# Patient Record
Sex: Female | Born: 1985
Health system: Southern US, Community
[De-identification: ages and names within clinical notes are randomized; demographics above are authoritative.]

## PROBLEM LIST (undated history)

## (undated) DIAGNOSIS — G40219 Localization-related (focal) (partial) symptomatic epilepsy and epileptic syndromes with complex partial seizures, intractable, without status epilepticus: Secondary | ICD-10-CM

## (undated) DIAGNOSIS — T7840XA Allergy, unspecified, initial encounter: Secondary | ICD-10-CM

## (undated) DIAGNOSIS — R29891 Ocular torticollis: Secondary | ICD-10-CM

## (undated) DIAGNOSIS — F419 Anxiety disorder, unspecified: Secondary | ICD-10-CM

## (undated) DIAGNOSIS — D649 Anemia, unspecified: Secondary | ICD-10-CM

## (undated) DIAGNOSIS — H5022 Vertical strabismus, left eye: Secondary | ICD-10-CM

## (undated) DIAGNOSIS — G40909 Epilepsy, unspecified, not intractable, without status epilepticus: Secondary | ICD-10-CM

## (undated) DIAGNOSIS — F79 Unspecified intellectual disabilities: Secondary | ICD-10-CM

## (undated) DIAGNOSIS — R569 Unspecified convulsions: Secondary | ICD-10-CM

## (undated) DIAGNOSIS — G4700 Insomnia, unspecified: Secondary | ICD-10-CM

## (undated) DIAGNOSIS — F070 Personality change due to known physiological condition: Secondary | ICD-10-CM

## (undated) DIAGNOSIS — R451 Restlessness and agitation: Secondary | ICD-10-CM

## (undated) DIAGNOSIS — Z9689 Presence of other specified functional implants: Secondary | ICD-10-CM

## (undated) HISTORY — PX: EYE SURGERY: SHX253

## (undated) HISTORY — DX: Epilepsy, unspecified, not intractable, without status epilepticus: G40.909

## (undated) HISTORY — DX: Personality change due to known physiological condition: F07.0

## (undated) HISTORY — DX: Anxiety disorder, unspecified: F41.9

## (undated) HISTORY — DX: Presence of other specified functional implants: Z96.89

## (undated) HISTORY — DX: Allergy, unspecified, initial encounter: T78.40XA

## (undated) HISTORY — DX: Anemia, unspecified: D64.9

## (undated) HISTORY — PX: BRAIN SURGERY: SHX531

## (undated) HISTORY — DX: Insomnia, unspecified: G47.00

## (undated) HISTORY — PX: TONSILLECTOMY AND ADENOIDECTOMY: SUR1326

## (undated) HISTORY — PX: ABDOMINAL HYSTERECTOMY: SHX81

## (undated) HISTORY — PX: CRANIOTOMY FOR TEMPORAL LOBECTOMY: SUR342

---
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
## 2007-07-26 ENCOUNTER — Emergency Department: Payer: Self-pay | Admitting: Emergency Medicine

## 2008-01-02 ENCOUNTER — Ambulatory Visit: Payer: Self-pay | Admitting: Pediatrics

## 2008-02-03 ENCOUNTER — Emergency Department: Payer: Self-pay | Admitting: Emergency Medicine

## 2008-08-25 ENCOUNTER — Ambulatory Visit: Payer: Self-pay | Admitting: Internal Medicine

## 2009-06-26 ENCOUNTER — Observation Stay: Payer: Self-pay | Admitting: Internal Medicine

## 2009-08-22 ENCOUNTER — Other Ambulatory Visit: Payer: Self-pay | Admitting: Internal Medicine

## 2010-08-14 ENCOUNTER — Other Ambulatory Visit: Payer: Self-pay

## 2010-10-15 ENCOUNTER — Emergency Department: Payer: Self-pay | Admitting: Emergency Medicine

## 2011-01-29 ENCOUNTER — Ambulatory Visit (INDEPENDENT_AMBULATORY_CARE_PROVIDER_SITE_OTHER): Payer: 59 | Admitting: Internal Medicine

## 2011-01-29 ENCOUNTER — Encounter: Payer: Self-pay | Admitting: Internal Medicine

## 2011-01-29 ENCOUNTER — Telehealth: Payer: Self-pay | Admitting: Internal Medicine

## 2011-01-29 VITALS — BP 119/84 | HR 81 | Temp 97.1°F | Resp 16 | Ht 64.0 in | Wt 127.5 lb

## 2011-01-29 DIAGNOSIS — Z23 Encounter for immunization: Secondary | ICD-10-CM

## 2011-01-29 DIAGNOSIS — H1030 Unspecified acute conjunctivitis, unspecified eye: Secondary | ICD-10-CM

## 2011-01-29 MED ORDER — CIPROFLOXACIN HCL 0.3 % OP SOLN: 1.0000 [drp] | OPHTHALMIC | Status: AC

## 2011-01-29 NOTE — Telephone Encounter (Signed)
Patient was seen today by Dr. Darrick Huntsman

## 2011-01-29 NOTE — Progress Notes (Signed)
  Subjective:    Patient ID: Lindsey [name], female    DOB: 05/07/1986, 25 y.o.   MRN: [id number]  HPI  25 yo female with history of right temporal lobe lobectomy secondary to eplepsy since age 89, brought in by mother for sudden onset of conjunctivitis of right lower eyelid which began 2 days ago.     Review of Systems     Objective:   Physical Exam  Eyes:    Lymphadenopathy:       Head (right side): Preauricular and posterior auricular adenopathy present.    She has cervical adenopathy.       Right cervical: Posterior cervical adenopathy present.         with swe Assessment & Plan:  Conjunctivitis: involving her right eye,  With redness and swelling since Saturday night,with clear drainange so far.  No allergic symptoms. Will treat with ciprofloxacin.

## 2011-01-29 NOTE — Telephone Encounter (Signed)
Patient has right eye irrated started Saturday.  Eye is real watering/swollen  Can pt be seen today

## 2011-01-29 NOTE — Patient Instructions (Addendum)
Conjunctivitis Conjunctivitis is commonly called "pink eye." Conjunctivitis can be caused by bacterial or viral infection, allergies, or injuries. There is usually redness of the lining of the eye, itching, discomfort, and sometimes discharge. There may be deposits of matter along the eyelids. A viral infection usually causes a watery discharge, while a bacterial infection causes a yellowish, thick discharge. Pink eye is very contagious and spreads by direct contact. You may be given antibiotic eyedrops as part of your treatment. Before using your eye medicine, remove all drainage from the eye by washing gently with warm water and cotton balls. Continue to use the medication until you have awakened 2 mornings in a row without discharge from the eye. Do not rub your eye. This increases the irritation and helps spread infection. Use separate towels from other household members. Wash your hands with soap and water before and after touching your eyes. Use cold compresses to reduce pain and sunglasses to relieve irritation from light. Do not wear contact lenses or wear eye makeup until the infection is gone. SEEK MEDICAL CARE IF:  Your symptoms are not better after 3 days of treatment.   You have increased pain or trouble seeing.   The outer eyelids become very red or swollen.  Document Released: 05/31/2004 Document Re-Released: 07/18/2009 [facility] Patient Information 2011 [place], [place].   We are prescribing  Ciprofloxacin eye drops to use on Lindsey [name]'s right eye.  If she will not tolerate the drops,  Call us back and we will prescribe oral medications.

## 2011-02-12 ENCOUNTER — Telehealth: Payer: Self-pay | Admitting: Internal Medicine

## 2011-02-12 MED ORDER — DIAZEPAM 5 MG PO TABS: ORAL_TABLET | ORAL | Status: DC

## 2011-02-12 NOTE — Telephone Encounter (Signed)
Patient was instructed she could take a pill and a half but the instructions on her medication is reading only reading to take one pill . She wants the direction changed on her medication . Could you send into the pharmacy new instructions.

## 2011-02-12 NOTE — Telephone Encounter (Signed)
Rx has been called in  

## 2011-03-09 ENCOUNTER — Telehealth: Payer: Self-pay | Admitting: Internal Medicine

## 2011-03-09 NOTE — Telephone Encounter (Signed)
Wherever Dr. Darrick Huntsman has an appt open for a 30 minute visit put her in there.

## 2011-03-09 NOTE — Telephone Encounter (Signed)
Pt mom called she need appointment for Lindsey [name] to get a general cpx to get a form for caps services filled out. She needs this appointment asap  Please let me know when i can get her in  [phone]

## 2011-03-12 NOTE — Telephone Encounter (Signed)
appt 11/14 ok per Morrie Sheldon w

## 2011-03-21 ENCOUNTER — Encounter: Payer: Self-pay | Admitting: Internal Medicine

## 2011-03-21 ENCOUNTER — Ambulatory Visit (INDEPENDENT_AMBULATORY_CARE_PROVIDER_SITE_OTHER): Payer: 59 | Admitting: Internal Medicine

## 2011-03-21 VITALS — BP 122/82 | HR 80 | Temp 98.3°F | Resp 16 | Ht 64.0 in | Wt 133.5 lb

## 2011-03-21 DIAGNOSIS — G40909 Epilepsy, unspecified, not intractable, without status epilepticus: Secondary | ICD-10-CM

## 2011-03-23 DIAGNOSIS — G40909 Epilepsy, unspecified, not intractable, without status epilepticus: Secondary | ICD-10-CM | POA: Insufficient documentation

## 2011-03-23 DIAGNOSIS — G40119 Localization-related (focal) (partial) symptomatic epilepsy and epileptic syndromes with simple partial seizures, intractable, without status epilepticus: Secondary | ICD-10-CM | POA: Insufficient documentation

## 2011-03-24 ENCOUNTER — Encounter: Payer: Self-pay | Admitting: Internal Medicine

## 2011-03-24 NOTE — Progress Notes (Signed)
  Subjective:    Patient ID: Lindsey [name], female    DOB: March 16, 1986, 25 y.o.   MRN: [id number]  HPI  Lindsey [name] is a 25 yo white female with a history of seizure disorder, s/p temporal lobectomy and vagal nerve stimulator , followed by Dr. Quintin Alto at [facility], who is brought in by her mother Lindsey [name] for her annual exam.  Due to behavioral issues we have agreed that pelvic exams are not necessary as she is s/p hysterectomy, lives with her mother and is not sexually active.  She continues to have episodes of constipation managed with daily fiber laxatives.  Has not had a seizure in over a month, since her medications were adjusted.  No new issues.  Attends daycare  With other adult patients with brain syndromes.    Past Medical History  Diagnosis Date  . Temporal lobectomy behavior syndrome 1994    Right  . Status post VNS (vagus nerve stimulator) placement 2000  . Epilepsy age 25    negative metabolic workup has brain abnormalities by MRI (Dr. Quintin Alto, [facility])  . Arthritis   . Insomnia    Current Outpatient Prescriptions on File Prior to Visit  Medication Sig Dispense Refill  . Clindamycin-Benzoyl Per, Refr, gel Apply topically daily as needed.        . diazepam (VALIUM) 5 MG tablet Take one and a half tablet at bedtime as needed.  45 tablet  3  . lamoTRIgine (LAMICTAL) 100 MG tablet Take 3 tablets by mouth two times a day.       . promethazine (PHENERGAN) 25 MG suppository Place 25 mg rectally every 6 (six) hours as needed.        Marland Kitchen QUEtiapine (SEROQUEL XR) 50 MG TB24 Take 50 mg by mouth daily.        Marland Kitchen tretinoin microspheres (RETIN-A MICRO) 0.04 % gel Apply topically as needed.          Review of Systems  Unable to perform ROS      Objective:   Physical Exam  Constitutional: She is oriented to person, place, and time. She appears well-developed and well-nourished.  HENT:  Mouth/Throat: Oropharynx is clear and moist.  Eyes: EOM are normal. Pupils are equal, round, and reactive to light.  No scleral icterus.  Neck: Normal range of motion. Neck supple. No JVD present. No thyromegaly present.  Cardiovascular: Normal rate, regular rhythm, normal heart sounds and intact distal pulses.   Pulmonary/Chest: Effort normal and breath sounds normal.  Abdominal: Soft. Bowel sounds are normal. She exhibits no mass. There is no tenderness.  Musculoskeletal: Normal range of motion. She exhibits no edema.  Lymphadenopathy:    She has no cervical adenopathy.  Neurological: She is alert and oriented to person, place, and time.  Skin: Skin is warm and dry.  Psychiatric: Her mood appears anxious. Her speech is delayed. She is is hyperactive. Cognition and memory are impaired. She expresses impulsivity.          Assessment & Plan:  Annual exam:  Given her disability, she is not a candidate for mammograms due to her behavioral problems,  and has undergone hysterectomy.   Screening for metabolic disorders continues on an annual basis for hypothyroidism and diabetes.

## 2011-03-24 NOTE — Assessment & Plan Note (Addendum)
Currently controlled, with prior hospitalzations for grand mal seziure but none in the past 6 to 8 months  Continue current medications and followup with Dr. Quintin Alto as planned

## 2011-05-02 ENCOUNTER — Encounter: Payer: Self-pay | Admitting: Internal Medicine

## 2011-05-02 ENCOUNTER — Ambulatory Visit (INDEPENDENT_AMBULATORY_CARE_PROVIDER_SITE_OTHER): Payer: 59 | Admitting: Internal Medicine

## 2011-05-02 VITALS — BP 110/62 | HR 90 | Temp 99.0°F | Wt 137.0 lb

## 2011-05-02 DIAGNOSIS — H00029 Hordeolum internum unspecified eye, unspecified eyelid: Secondary | ICD-10-CM

## 2011-05-02 MED ORDER — POLYMYXIN B-TRIMETHOPRIM 10000-0.1 UNIT/ML-% OP SOLN
1.0000 [drp] | OPHTHALMIC | Status: DC
Start: 2011-05-02 — End: 2014-03-25

## 2011-05-02 NOTE — Patient Instructions (Signed)
Sty A sty (hordeolum) is an infection of a gland in the eyelid located at the base of the eyelash. A sty may develop a white or yellow head of pus. It can be puffy (swollen). Usually, the sty will burst and pus will come out on its own. They do not leave lumps in the eyelid once they drain. A sty is often confused with another form of cyst of the eyelid called a chalazion. Chalazions occur within the eyelid and not on the edge where the bases of the eyelashes are. They often are red, sore and then form firm lumps in the eyelid. CAUSES   Germs (bacteria).   Lasting (chronic) eyelid inflammation.  SYMPTOMS   Tenderness, redness and swelling along the edge of the eyelid at the base of the eyelashes.   Sometimes, there is a white or yellow head of pus. It may or may not drain.  DIAGNOSIS  An ophthalmologist will be able to distinguish between a sty and a chalazion and treat the condition appropriately.  TREATMENT   Styes are typically treated with warm packs (compresses) until drainage occurs.   In rare cases, medicines that kill germs (antibiotics) may be prescribed. These antibiotics may be in the form of drops, cream or pills.   If a hard lump has formed, it is generally necessary to do a small incision and remove the hardened contents of the cyst in a minor surgical procedure done in the office.   In suspicious cases, your caregiver may send the contents of the cyst to the lab to be certain that it is not a rare, but dangerous form of cancer of the glands of the eyelid.  HOME CARE INSTRUCTIONS   Wash your hands often and dry them with a clean towel. Avoid touching your eyelid. This may spread the infection to other parts of the eye.   Apply heat to your eyelid for 10 to 20 minutes, several times a day, to ease pain and help to heal it faster.   Do not squeeze the sty. Allow it to drain on its own. Wash your eyelid carefully 3 to 4 times per day to remove any pus.  SEEK IMMEDIATE  MEDICAL CARE IF:   Your eye becomes painful or puffy (swollen).   Your vision changes.   Your sty does not drain by itself within 3 days.   Your sty comes back within a short period of time, even with treatment.   You have redness (inflammation) around the eye.   You have a fever.  Document Released: 01/31/2005 Document Revised: 01/03/2011 Document Reviewed: 10/05/2008 [employer] Patient Information 2012 [employer]. 

## 2011-05-02 NOTE — Progress Notes (Signed)
  Subjective:    Patient ID: Lindsey [name], female    DOB: 1986-04-13, 25 y.o.   MRN: [id number]  HPI 25YO female with seizure disorder and developmental delay presents for an acute visit c/o stye right upper eyelid. Pt mother first noticed this lesion 04/30/2011. Tried to apply warm compresses, but daughter will not allow her to.  Minimal drainage from right eye noted. No redness over eye itself. No fever or chills. Pt mother also noted some hoarseness over last few days. No nasal congestion or cough.   Outpatient Encounter Prescriptions as of 05/02/2011  Medication Sig Dispense Refill  . Clindamycin-Benzoyl Per, Refr, gel Apply topically daily as needed.        . diazepam (VALIUM) 5 MG tablet Take one and a half tablet at bedtime as needed.  45 tablet  3  . lamoTRIgine (LAMICTAL) 100 MG tablet Take 3 tablets by mouth two times a day.       . promethazine (PHENERGAN) 25 MG suppository Place 25 mg rectally every 6 (six) hours as needed.        Marland Kitchen QUEtiapine (SEROQUEL XR) 50 MG TB24 Take 50 mg by mouth daily.        Marland Kitchen tretinoin microspheres (RETIN-A MICRO) 0.04 % gel Apply topically as needed.        . trimethoprim-polymyxin b (POLYTRIM) ophthalmic solution Place 1 drop into the right eye every 4 (four) hours.  10 mL  0     Review of Systems  Constitutional: Negative for fever and chills.  Eyes: Positive for discharge and redness (right upper eyelid).   BP 110/62  Pulse 90  Temp(Src) 99 F (37.2 C) (Axillary)  Wt 137 lb (62.143 kg)  SpO2 96%  LMP 01/09/2011     Objective:   Physical Exam  Constitutional: She appears well-developed and well-nourished. No distress.  HENT:  Head: Normocephalic and atraumatic.  Eyes: Conjunctivae and EOM are normal. Pupils are equal, round, and reactive to light. Right eye exhibits no discharge. Left eye exhibits no discharge.    Cardiovascular: Normal rate and regular rhythm.   Pulmonary/Chest: No accessory muscle usage. Not tachypneic. She has  no decreased breath sounds. She has no wheezes. She has no rhonchi. She has no rales.  Skin: She is not diaphoretic.  Psychiatric: Her speech is normal and behavior is normal. Her mood appears anxious. Cognition and memory are impaired.          Assessment & Plan:  1. Stye - Recommended using warm compresses. Gave handout on stye's.  Will also have pt mother apply topical polytrim.  If no improvement or if erythema worsens over next 24hr, they will call.

## 2011-05-29 ENCOUNTER — Other Ambulatory Visit: Payer: Self-pay | Admitting: *Deleted

## 2011-05-29 MED ORDER — DIAZEPAM 5 MG PO TABS: ORAL_TABLET | ORAL | Status: DC

## 2011-05-29 NOTE — Telephone Encounter (Signed)
Faxed request from [facility] [address], request is for a new script.

## 2011-05-31 NOTE — Telephone Encounter (Signed)
Medication has been called to pharmacy.  

## 2011-06-27 ENCOUNTER — Ambulatory Visit (INDEPENDENT_AMBULATORY_CARE_PROVIDER_SITE_OTHER): Payer: 59 | Admitting: Internal Medicine

## 2011-06-27 ENCOUNTER — Encounter: Payer: Self-pay | Admitting: Internal Medicine

## 2011-06-27 DIAGNOSIS — B9789 Other viral agents as the cause of diseases classified elsewhere: Secondary | ICD-10-CM

## 2011-06-27 DIAGNOSIS — B349 Viral infection, unspecified: Secondary | ICD-10-CM

## 2011-06-27 DIAGNOSIS — L259 Unspecified contact dermatitis, unspecified cause: Secondary | ICD-10-CM

## 2011-06-27 DIAGNOSIS — L708 Other acne: Secondary | ICD-10-CM

## 2011-06-27 DIAGNOSIS — J029 Acute pharyngitis, unspecified: Secondary | ICD-10-CM

## 2011-06-27 MED ORDER — AMOXICILLIN-POT CLAVULANATE 875-125 MG PO TABS: 1.0000 | ORAL_TABLET | Freq: Two times a day (BID) | ORAL | Status: AC

## 2011-06-27 NOTE — Patient Instructions (Signed)
Lindsey [name] has a viral infection.  If you can get her to use simply saline nasal spray twice daily to flush sinuses,  It will help prevent a bacterial sinus infection .     Topical acne preparations with benzoyl peroxide or salicylic acid,,  Also try salt paste applied for 15 minutes to back,  Then rinse off

## 2011-06-30 DIAGNOSIS — J029 Acute pharyngitis, unspecified: Secondary | ICD-10-CM | POA: Insufficient documentation

## 2011-06-30 DIAGNOSIS — B349 Viral infection, unspecified: Secondary | ICD-10-CM | POA: Insufficient documentation

## 2011-06-30 DIAGNOSIS — L708 Other acne: Secondary | ICD-10-CM | POA: Insufficient documentation

## 2011-06-30 NOTE — Assessment & Plan Note (Signed)
Affecting skin on her back.  Recommended topical treatments containing salicylic acid or benzoyl peroxide.

## 2011-06-30 NOTE — Assessment & Plan Note (Signed)
She has  No signs of symptoms of bacterila pharyngitis.  Recommend use of salt water gargles,  Saline sinus rinse, avoid decongestants given history of seizures.  Stann Mainland start empiric antibiotics if she spikes a fever or decompensates.

## 2011-06-30 NOTE — Progress Notes (Signed)
Subjective:    Patient ID: Lindsey [name], female    DOB: Dec 05, 1985, 26 y.o.   MRN: [id number]  HPI  25 yr white female with cerebral palsy,  Seizure disorder, brought in by mother with cc of sore throat for the last 24 hours.  No fevers, cough, or reports of ear or sinus pain .   No recent sick contacts.  Past Medical History  Diagnosis Date  . Temporal lobectomy behavior syndrome 1994    Right  . Status post VNS (vagus nerve stimulator) placement 2000  . Epilepsy age 53    negative metabolic workup has brain abnormalities by MRI (Dr. Quintin Alto, [facility])  . Arthritis   . Insomnia    Current Outpatient Prescriptions on File Prior to Visit  Medication Sig Dispense Refill  . Clindamycin-Benzoyl Per, Refr, gel Apply topically daily as needed.        . diazepam (VALIUM) 5 MG tablet Take one and a half tablet at bedtime as needed.  45 tablet  5  . lamoTRIgine (LAMICTAL) 100 MG tablet Take 3 tablets by mouth two times a day.       . promethazine (PHENERGAN) 25 MG suppository Place 25 mg rectally every 6 (six) hours as needed.        Marland Kitchen QUEtiapine (SEROQUEL XR) 50 MG TB24 Take 50 mg by mouth daily.        Marland Kitchen tretinoin microspheres (RETIN-A MICRO) 0.04 % gel Apply topically as needed.           Review of Systems  Constitutional: Negative for fever, chills and unexpected weight change.  HENT: Positive for sore throat. Negative for hearing loss, ear pain, nosebleeds, congestion, facial swelling, rhinorrhea, sneezing, mouth sores, trouble swallowing, neck pain, neck stiffness, voice change, postnasal drip, sinus pressure, tinnitus and ear discharge.   Eyes: Negative for pain, discharge, redness and visual disturbance.  Respiratory: Negative for cough, chest tightness, shortness of breath, wheezing and stridor.   Cardiovascular: Negative for chest pain, palpitations and leg swelling.  Musculoskeletal: Negative for myalgias and arthralgias.  Skin: Negative for color change and rash.  Neurological:  Negative for dizziness, weakness, light-headedness and headaches.  Hematological: Negative for adenopathy.       Objective:   Physical Exam  Constitutional: She is oriented to person, place, and time. She appears well-developed and well-nourished. No distress.  HENT:  Head: Normocephalic and atraumatic.  Mouth/Throat: Oropharynx is clear and moist.  Eyes: Conjunctivae, EOM and lids are normal. Pupils are equal, round, and reactive to light. Right eye exhibits no discharge. Left eye exhibits no discharge. No scleral icterus.  Neck: Normal range of motion. Neck supple. No JVD present. No thyromegaly present.  Cardiovascular: Normal rate, regular rhythm, normal heart sounds and intact distal pulses.   Pulmonary/Chest: Effort normal and breath sounds normal. No accessory muscle usage. Not tachypneic. She has no decreased breath sounds. She has no wheezes. She has no rhonchi. She has no rales.  Abdominal: Soft. Bowel sounds are normal. She exhibits no mass. There is no tenderness.  Musculoskeletal: Normal range of motion. She exhibits no edema.  Lymphadenopathy:    She has no cervical adenopathy.  Neurological: She is alert and oriented to person, place, and time.  Skin: Skin is warm and dry. She is not diaphoretic.  Psychiatric: Her speech is normal and behavior is normal. Her mood appears anxious. Cognition and memory are impaired. She expresses impulsivity.       Assessment & Plan:   Pharyngitis with  viral syndrome She has  No signs of symptoms of bacterila pharyngitis.  Recommend use of salt water gargles,  Saline sinus rinse, avoid decongestants given history of seizures.  Stann Mainland start empiric antibiotics if she spikes a fever or decompensates.   Acneiform dermatitis Affecting skin on her back.  Recommended topical treatments containing salicylic acid or benzoyl peroxide.     Updated Medication List Outpatient Encounter Prescriptions as of 06/27/2011  Medication Sig Dispense Refill   . Clindamycin-Benzoyl Per, Refr, gel Apply topically daily as needed.        . diazepam (VALIUM) 5 MG tablet Take one and a half tablet at bedtime as needed.  45 tablet  5  . lamoTRIgine (LAMICTAL) 100 MG tablet Take 3 tablets by mouth two times a day.       . promethazine (PHENERGAN) 25 MG suppository Place 25 mg rectally every 6 (six) hours as needed.        Marland Kitchen QUEtiapine (SEROQUEL XR) 50 MG TB24 Take 50 mg by mouth daily.        Marland Kitchen tretinoin microspheres (RETIN-A MICRO) 0.04 % gel Apply topically as needed.        Marland Kitchen amoxicillin-clavulanate (AUGMENTIN) 875-125 MG per tablet Take 1 tablet by mouth 2 (two) times daily.  14 tablet  0

## 2011-07-03 ENCOUNTER — Other Ambulatory Visit: Payer: Self-pay | Admitting: Internal Medicine

## 2011-07-03 MED ORDER — QUETIAPINE FUMARATE ER 50 MG PO TB24: 50.0000 mg | ORAL_TABLET | Freq: Every day | ORAL | Status: DC

## 2011-10-15 ENCOUNTER — Encounter: Payer: Self-pay | Admitting: Internal Medicine

## 2011-10-15 ENCOUNTER — Ambulatory Visit (INDEPENDENT_AMBULATORY_CARE_PROVIDER_SITE_OTHER): Payer: 59 | Admitting: Internal Medicine

## 2011-10-15 VITALS — BP 108/66 | HR 100 | Temp 98.8°F | Resp 16 | Wt 139.5 lb

## 2011-10-15 DIAGNOSIS — B349 Viral infection, unspecified: Secondary | ICD-10-CM

## 2011-10-15 DIAGNOSIS — J029 Acute pharyngitis, unspecified: Secondary | ICD-10-CM

## 2011-10-15 DIAGNOSIS — B9789 Other viral agents as the cause of diseases classified elsewhere: Secondary | ICD-10-CM

## 2011-10-15 MED ORDER — AZITHROMYCIN 500 MG PO TABS: 500.0000 mg | ORAL_TABLET | Freq: Every day | ORAL | Status: AC

## 2011-10-15 NOTE — Progress Notes (Signed)
Patient ID: Lindsey [name], female   DOB: 17-Nov-1985, 26 y.o.   MRN: [id number]  Patient Active Problem List  Diagnoses  . Seizure disorder, primary  . Pharyngitis with viral syndrome  . Acneiform dermatitis    Subjective:  CC:   Chief Complaint  Patient presents with  . Sore Throat    HPI:   Lindsey [name] a 26 y.o. female who presents with a sore throat.  Per mother, patient has been complaining of sore throat for 2 days without fever.  Her sinuses have been congested and mother has had to manually decompress them with suction as patient does not like to blow her nose.  They are going to the beach this weekend. She cannot take antihistamines or decongestants due to her seizure disorder.    Past Medical History  Diagnosis Date  . Temporal lobectomy behavior syndrome 1994    Right  . Status post VNS (vagus nerve stimulator) placement 2000  . Epilepsy age 39    negative metabolic workup has brain abnormalities by MRI (Dr. Quintin Alto, [facility])  . Arthritis   . Insomnia     Past Surgical History  Procedure Date  . Tonsillectomy and adenoidectomy age 57  . Abdominal hysterectomy 2005  . Brain surgery     Temporal Lobe resection  . Craniotomy for temporal lobectomy          The following portions of the patient's history were reviewed and updated as appropriate: Allergies, current medications, and problem list.    Review of Systems:   12 Pt  review of systems was negative except those addressed in the HPI,     History   Social History  . Marital Status: Single    Spouse Name: N/A    Number of Children: N/A  . Years of Education: N/A   Occupational History  . Not on file.   Social History Main Topics  . Smoking status: Never Smoker   . Smokeless tobacco: Never Used  . Alcohol Use: No  . Drug Use: No  . Sexually Active: Not on file   Other Topics Concern  . Not on file   Social History Narrative  . No narrative on file    Objective:  BP  108/66  Pulse 100  Temp(Src) 98.8 F (37.1 C) (Axillary)  Resp 16  Wt 139 lb 8 oz (63.277 kg)  SpO2 97%  LMP 01/09/2011  General appearance: alert, cooperative and appears stated age Ears: l TM's not visible due to cerumen,  and external ear canals both ears Throat: lips, mucosa, and tongue normal; teeth and gums normal. oropharnyx mildly  Erythematous, no ulcers.  Neck: no adenopathy, no carotid bruit, supple, symmetrical, trachea midline and thyroid not enlarged, symmetric, no tenderness/mass/nodules Back: symmetric, no curvature. ROM normal. No CVA tenderness. Lungs: clear to auscultation bilaterally Heart: regular rate and rhythm, S1, S2 normal, no murmur, click, rub or gallop Abdomen: soft, non-tender; bowel sounds normal; no masses,  no organomegaly Pulses: 2+ and symmetric Skin: Skin color, texture, turgor normal. No rashes or lesions Lymph nodes: Cervical, supraclavicular, and axillary nodes normal.  Assessment and Plan:  Pharyngitis with viral syndrome No signs on exam of bacterial infection, but her TMs are occluded by cerumen impaction and she is at increased risk for bacterial superinfection from viral syndromes because of her inability to blow nose, and manage symptoms with decongestants, sinus lavage, etc. rx for antibiotic given to [name], her mother, for use if symptoms progress  Updated Medication List Outpatient Encounter Prescriptions as of 10/15/2011  Medication Sig Dispense Refill  . Clindamycin-Benzoyl Per, Refr, gel Apply topically daily as needed.        . diazepam (VALIUM) 5 MG tablet Take one and a half tablet at bedtime as needed.  45 tablet  5  . lamoTRIgine (LAMICTAL) 100 MG tablet Take 3 tablets by mouth two times a day.       . promethazine (PHENERGAN) 25 MG suppository Place 25 mg rectally every 6 (six) hours as needed.        Marland Kitchen QUEtiapine (SEROQUEL XR) 50 MG TB24 Take 1 tablet (50 mg total) by mouth daily.  90 each  3  . tretinoin microspheres  (RETIN-A MICRO) 0.04 % gel Apply topically as needed.        Marland Kitchen azithromycin (ZITHROMAX) 500 MG tablet Take 1 tablet (500 mg total) by mouth daily.  7 tablet  0     No orders of the defined types were placed in this encounter.    No Follow-up on file.

## 2011-10-15 NOTE — Assessment & Plan Note (Signed)
No signs on exam of bacterial infection, but her TMs are occluded by cerumen impaction and she is at increased risk for bacterial superinfection from viral syndromes because of her inability to blow nose, and manage symptoms with decongestants, sinus lavage, etc. rx for antibiotic given to [name], her mother, for use if symptoms progress

## 2011-11-27 ENCOUNTER — Other Ambulatory Visit: Payer: Self-pay | Admitting: Internal Medicine

## 2011-11-27 MED ORDER — DIAZEPAM 5 MG PO TABS: ORAL_TABLET | ORAL | Status: DC

## 2011-11-28 ENCOUNTER — Telehealth: Payer: Self-pay | Admitting: Internal Medicine

## 2011-11-28 NOTE — Telephone Encounter (Signed)
Form will be mailed when completed.

## 2011-11-28 NOTE — Telephone Encounter (Signed)
Form in box for community support services   Pt mom would like this mailed to her.  Attached self address stamped envelope

## 2011-12-17 ENCOUNTER — Telehealth: Payer: Self-pay | Admitting: Internal Medicine

## 2011-12-17 NOTE — Telephone Encounter (Signed)
Community support service form in box Please mail to pt when ready

## 2011-12-17 NOTE — Telephone Encounter (Signed)
Forms in red folder to be signed.

## 2012-01-29 ENCOUNTER — Encounter: Payer: Self-pay | Admitting: Internal Medicine

## 2012-01-29 ENCOUNTER — Ambulatory Visit (INDEPENDENT_AMBULATORY_CARE_PROVIDER_SITE_OTHER): Payer: 59 | Admitting: Internal Medicine

## 2012-01-29 VITALS — BP 120/68 | HR 120 | Temp 99.0°F | Resp 18 | Wt 150.0 lb

## 2012-01-29 DIAGNOSIS — B349 Viral infection, unspecified: Secondary | ICD-10-CM

## 2012-01-29 DIAGNOSIS — B9789 Other viral agents as the cause of diseases classified elsewhere: Secondary | ICD-10-CM

## 2012-01-29 DIAGNOSIS — J029 Acute pharyngitis, unspecified: Secondary | ICD-10-CM

## 2012-01-29 DIAGNOSIS — Z23 Encounter for immunization: Secondary | ICD-10-CM

## 2012-01-29 NOTE — Addendum Note (Signed)
Addended by: Darletta Moll A on: 01/29/2012 10:04 AM   Modules accepted: Orders

## 2012-01-29 NOTE — Progress Notes (Signed)
Patient ID: Lindsey [name], female   DOB: December 19, 1985, 26 y.o.   MRN: [id number]   Patient Active Problem List  Diagnosis  . Seizure disorder, primary  . Pharyngitis with viral syndrome  . Acneiform dermatitis    Subjective:  CC:   Chief Complaint  Patient presents with  . Cough  . Sinusitis    HPI:   Lindsey [name] a 26 y.o. female who presents2 day history of runny nose,  Sore throat and cough.  Mother reports no fevers, diarrhea or wheezing.   No sick contacts.      Past Medical History  Diagnosis Date  . Temporal lobectomy behavior syndrome 1994    Right  . Status post VNS (vagus nerve stimulator) placement 2000  . Epilepsy age 35    negative metabolic workup has brain abnormalities by MRI (Dr. Quintin Alto, [facility])  . Arthritis   . Insomnia     Past Surgical History  Procedure Date  . Tonsillectomy and adenoidectomy age 87  . Abdominal hysterectomy 2005  . Brain surgery     Temporal Lobe resection  . Craniotomy for temporal lobectomy          The following portions of the patient's history were reviewed and updated as appropriate: Allergies, current medications, and problem list.    Review of Systems:   12 Pt  review of systems was negative except those addressed in the HPI,     History   Social History  . Marital Status: Single    Spouse Name: N/A    Number of Children: N/A  . Years of Education: N/A   Occupational History  . Not on file.   Social History Main Topics  . Smoking status: Never Smoker   . Smokeless tobacco: Never Used  . Alcohol Use: No  . Drug Use: No  . Sexually Active: Not on file   Other Topics Concern  . Not on file   Social History Narrative  . No narrative on file    Objective:  BP 120/68  Pulse 120  Temp 99 F (37.2 C) (Axillary)  Resp 18  Wt 150 lb (68.04 kg)  SpO2 98%  LMP 01/09/2011  General appearance: alert, cooperative and appears stated age Ears: normal TM's and external ear canals both  ears Throat: lips, mucosa, and tongue normal; teeth and gums normal Neck: no adenopathy, no carotid bruit, supple, symmetrical, trachea midline and thyroid not enlarged, symmetric, no tenderness/mass/nodules Back: symmetric, no curvature. ROM normal. No CVA tenderness. Lungs: clear to auscultation bilaterally Heart: regular rate and rhythm, S1, S2 normal, no murmur, click, rub or gallop Abdomen: soft, non-tender; bowel sounds normal; no masses,  no organomegaly Pulses: 2+ and symmetric Skin: Skin color, texture, turgor normal. No rashes or lesions Lymph nodes: Cervical, supraclavicular, and axillary nodes normal.  Assessment and Plan:  Pharyngitis with viral syndrome HEENT exam is completely normal. Suggest OTC antihistamine if tolerated .  OK to give flu shot today   Updated Medication List Outpatient Encounter Prescriptions as of 01/29/2012  Medication Sig Dispense Refill  . diazepam (VALIUM) 5 MG tablet Take one and a half tablet at bedtime as needed.  45 tablet  5  . lamoTRIgine (LAMICTAL) 100 MG tablet Take 3 tablets by mouth two times a day.       . promethazine (PHENERGAN) 25 MG suppository Place 25 mg rectally every 6 (six) hours as needed.        Marland Kitchen QUEtiapine (SEROQUEL XR) 50 MG TB24 Take  1 tablet (50 mg total) by mouth daily.  90 each  3  . DISCONTD: Clindamycin-Benzoyl Per, Refr, gel Apply topically daily as needed.        Marland Kitchen DISCONTD: tretinoin microspheres (RETIN-A MICRO) 0.04 % gel Apply topically as needed.           No orders of the defined types were placed in this encounter.    No Follow-up on file.

## 2012-01-29 NOTE — Assessment & Plan Note (Signed)
HEENT exam is completely normal. Suggest OTC antihistamine if tolerated .  OK to give flu shot today

## 2012-01-31 ENCOUNTER — Ambulatory Visit: Payer: 59

## 2012-02-04 ENCOUNTER — Ambulatory Visit (INDEPENDENT_AMBULATORY_CARE_PROVIDER_SITE_OTHER): Payer: 59 | Admitting: Internal Medicine

## 2012-02-04 ENCOUNTER — Encounter: Payer: Self-pay | Admitting: Internal Medicine

## 2012-02-04 VITALS — BP 118/66 | HR 82 | Temp 98.2°F | Ht 64.5 in | Wt 150.5 lb

## 2012-02-04 DIAGNOSIS — H00019 Hordeolum externum unspecified eye, unspecified eyelid: Secondary | ICD-10-CM

## 2012-02-04 NOTE — Patient Instructions (Addendum)
It was nice meeting both of you today.  I have prescribed polymyxin eye drops - to use as directed.  Let us know if symptoms do not completely resolve.

## 2012-02-04 NOTE — Progress Notes (Signed)
  Subjective:    Patient ID: Lindsey [name], female    DOB: Dec 31, 1985, 26 y.o.   MRN: [id number]  HPI  26 year old female with past history of seizure disorder who comes in today for an acute visit with concerns regarding eye irritation.  She is accompanied by her mother.  History obtained from her mother.   Past Medical History  Diagnosis Date  . Temporal lobectomy behavior syndrome 1994    Right  . Status post VNS (vagus nerve stimulator) placement 2000  . Epilepsy age 89    negative metabolic workup has brain abnormalities by MRI (Dr. Quintin Alto, [facility])  . Arthritis   . Insomnia    Outpatient Prescriptions Prior to Visit  Medication Sig Dispense Refill  . diazepam (VALIUM) 5 MG tablet Take one and a half tablet at bedtime as needed.  45 tablet  5  . lamoTRIgine (LAMICTAL) 100 MG tablet Take 3 tablets by mouth two times a day.       . promethazine (PHENERGAN) 25 MG suppository Place 25 mg rectally every 6 (six) hours as needed.        Marland Kitchen QUEtiapine (SEROQUEL XR) 50 MG TB24 Take 1 tablet (50 mg total) by mouth daily.  90 each  3    Review of Systems  Was seen last week.  Had some congestion.  This has resolved.  Had the flu shot.  Experienced arm soreness after the injection.  This has resolved.  Mother reports that starting two days ago, she noticed some eye redness (corner of her right eye).  No pain.  No discharge.  Has had problems with this previously - with last episode approximately six months ago.  No sore throat or nasal congestion.   No cough.         Objective:   Physical Exam 26 year old female in no acute distress.   HEENT:  Eyes - minimal erythema lower medial corner left eye.  No discharge.  Possible small sty.  Nares - clear.  OP- without lesions or erythema.  NECK:  Supple, nontender.    HEART:  Appears to be regular. LUNGS:  Without crackles or wheezing audible.  Respirations even and unlabored.       Assessment & Plan:  1.  STY.  Will treat with Polytrim (opth)  drops q 4-6 hours as directed.  I refilled her last rx.  Mother states she responded well to the Polytrim and tolerated well.  Instructed to call or return if symptoms did not improve.

## 2012-05-08 ENCOUNTER — Encounter: Payer: Self-pay | Admitting: Internal Medicine

## 2012-05-08 ENCOUNTER — Ambulatory Visit (INDEPENDENT_AMBULATORY_CARE_PROVIDER_SITE_OTHER): Payer: 59 | Admitting: Internal Medicine

## 2012-05-08 VITALS — BP 104/60 | HR 113 | Temp 97.2°F | Resp 16 | Wt 156.0 lb

## 2012-05-08 DIAGNOSIS — J069 Acute upper respiratory infection, unspecified: Secondary | ICD-10-CM | POA: Insufficient documentation

## 2012-05-08 DIAGNOSIS — G40909 Epilepsy, unspecified, not intractable, without status epilepticus: Secondary | ICD-10-CM

## 2012-05-08 DIAGNOSIS — G809 Cerebral palsy, unspecified: Secondary | ICD-10-CM

## 2012-05-08 MED ORDER — LEVOFLOXACIN 500 MG PO TABS: 500.0000 mg | ORAL_TABLET | Freq: Every day | ORAL | Status: DC

## 2012-05-08 NOTE — Assessment & Plan Note (Signed)
She was seizure-free in November  and had only one seizure in December. This represents an improvement.nHer disorder is managed with a vagus nerve stimulator and in medications .   follows up with Dr. Merlyn Lot

## 2012-05-08 NOTE — Assessment & Plan Note (Signed)
HEENT exam was difficult due to bilateral cerumen impaction. Patient will not allow disimpaction. Oropharynx was benign except for excessive nonpurulent mucous drainage. Lungs were clear. Continue supportive care. Sudafed PE for congestion. Add Levaquin if indicated in several days for failure to improve or escalation of symptoms including fevers in the pain or purulent sinus drainage.

## 2012-05-08 NOTE — Patient Instructions (Addendum)
Amisadai has  a viral  Syndrome .  The post nasal dripcan cause hoarseness, sore throat.   Sudafed PE 10 to 30 every 8 hours to manage the drainage and congestion.  Use Debrox for the wax buildup  If the throat is no better  In 3 to 4 days OR  if she develops T > 100.4,  Green nasal discharge,  Or facial pain, start the antibiotic.

## 2012-05-08 NOTE — Progress Notes (Signed)
Patient ID: Lindsey [name], female   DOB: [birth year], 27 y.o.   MRN: [id number]   . Patient Active Problem List  Diagnosis  . Seizure disorder, primary  . Pharyngitis with viral syndrome  . Acneiform dermatitis  . Sty  . Cerebral palsy  . URI (upper respiratory infection)    Subjective:  CC:   Chief Complaint  Patient presents with  . Cough    dry, given Delsyn Cough med    HPI:   Lindsey [name] a 27 y.o. female who presents Overnight development of slight hoarseness of voice accompanied by increased nasal drainage and cough. Patient is accompanied by her mother. She appears well. She has not been having any fevers. She has not been fussy or complaining of pain. She has not been pulling at her ears. There have been no sick contacts and no recent travel.   Past Medical History  Diagnosis Date  . Temporal lobectomy behavior syndrome 1994    Right  . Status post VNS (vagus nerve stimulator) placement 2000  . Epilepsy age 53    negative metabolic workup has brain abnormalities by MRI (Dr. Quintin Alto, [facility])  . Arthritis   . Insomnia     Past Surgical History  Procedure Date  . Tonsillectomy and adenoidectomy age 87  . Abdominal hysterectomy 2005  . Brain surgery     Temporal Lobe resection  . Craniotomy for temporal lobectomy          The following portions of the patient's history were reviewed and updated as appropriate: Allergies, current medications, and problem list.    Review of Systems:   12 Pt  review of systems was negative except those addressed in the HPI,     History   Social History  . Marital Status: Single    Spouse Name: N/A    Number of Children: N/A  . Years of Education: N/A   Occupational History  . Not on file.   Social History Main Topics  . Smoking status: Never Smoker   . Smokeless tobacco: Never Used  . Alcohol Use: No  . Drug Use: No  . Sexually Active: Not on file   Other Topics Concern  . Not on file    Social History Narrative  . No narrative on file    Objective:  BP 104/60  Pulse 113  Temp 97.2 F (36.2 C) (Oral)  Resp 16  Wt 156 lb (70.761 kg)  SpO2 97%  LMP 01/09/2011  General appearance: alert, cooperative and appears stated age Ears: normal TM's and external ear canals both ears Throat: lips, mucosa, and tongue normal; teeth and gums normal Neck: no adenopathy, no carotid bruit, supple, symmetrical, trachea midline and thyroid not enlarged, symmetric, no tenderness/mass/nodules Back: symmetric, no curvature. ROM normal. No CVA tenderness. Lungs: clear to auscultation bilaterally Heart: regular rate and rhythm, S1, S2 normal, no murmur, click, rub or gallop Abdomen: soft, non-tender; bowel sounds normal; no masses,  no organomegaly Pulses: 2+ and symmetric Skin: Skin color, texture, turgor normal. No rashes or lesions Lymph nodes: Cervical, supraclavicular, and axillary nodes normal.  Assessment and Plan:  URI (upper respiratory infection) HEENT exam was difficult due to bilateral cerumen impaction. Patient will not allow disimpaction. Oropharynx was benign except for excessive nonpurulent mucous drainage. Lungs were clear. Continue supportive care. Sudafed PE for congestion. Add Levaquin if indicated in several days for failure to improve or escalation of symptoms including fevers in the pain or purulent sinus drainage.  Seizure disorder, primary She was seizure-free in November  and had only one seizure in December. This represents an improvement.nHer disorder is managed with a vagus nerve stimulator and in medications .   follows up with Dr. Merlyn Lot   Updated Medication List Outpatient Encounter Prescriptions as of 05/08/2012  Medication Sig Dispense Refill  . diazepam (DIASTAT ACUDIAL) 10 MG GEL Place 20 mg rectally once.      . diazepam (VALIUM) 5 MG tablet Take one and a half tablet at bedtime as needed.  45 tablet  5  . lamoTRIgine (LAMICTAL) 100 MG tablet  Take 3 tablets by mouth two times a day.       Marland Kitchen QUEtiapine (SEROQUEL XR) 50 MG TB24 Take 1 tablet (50 mg total) by mouth daily.  90 each  3  . levofloxacin (LEVAQUIN) 500 MG tablet Take 1 tablet (500 mg total) by mouth daily.  7 tablet  0  . promethazine (PHENERGAN) 25 MG suppository Place 25 mg rectally every 6 (six) hours as needed.

## 2012-05-27 ENCOUNTER — Other Ambulatory Visit: Payer: Self-pay | Admitting: Internal Medicine

## 2012-05-27 MED ORDER — DIAZEPAM 5 MG PO TABS: ORAL_TABLET | ORAL | Status: DC

## 2012-05-27 NOTE — Telephone Encounter (Signed)
Refill on Diazapam 5 mg [facility].

## 2012-05-27 NOTE — Telephone Encounter (Signed)
Ok to refill, updated in epic

## 2012-05-27 NOTE — Telephone Encounter (Signed)
Last written 11/27/2011 #45 with 5 refills. Please advise.

## 2012-05-28 NOTE — Telephone Encounter (Signed)
Med phoned in °

## 2012-06-06 ENCOUNTER — Ambulatory Visit (INDEPENDENT_AMBULATORY_CARE_PROVIDER_SITE_OTHER): Payer: 59 | Admitting: Internal Medicine

## 2012-06-06 ENCOUNTER — Encounter: Payer: Self-pay | Admitting: Internal Medicine

## 2012-06-06 VITALS — BP 112/80 | HR 90 | Temp 96.8°F | Resp 16 | Ht 66.0 in | Wt 159.5 lb

## 2012-06-06 DIAGNOSIS — Z0000 Encounter for general adult medical examination without abnormal findings: Secondary | ICD-10-CM

## 2012-06-06 DIAGNOSIS — Z1331 Encounter for screening for depression: Secondary | ICD-10-CM

## 2012-06-06 NOTE — Progress Notes (Signed)
Patient ID: Lindsey [name], female   DOB: 1985-08-04, 27 y.o.   MRN: [id number]   Subjective:     Lindsey [name] is a 27 y.o. female here for a routine exam.  Current complaints:   Gynecologic History Patient's last menstrual period was 01/09/2011. Contraception: abstinence Last MVH:QIONGEXB as patient is not sexually active.  Obstetric History OB History    Grav Para Term Preterm Abortions TAB SAB Ect Mult Living                   The following portions of the patient's history were reviewed and updated as appropriate: allergies, current medications, past family history, past medical history, past social history, past surgical history and problem list.  Review of Systems A comprehensive review of systems was negative.    Objective:     BP 112/80  Pulse 90  Temp 96.8 F (36 C) (Axillary)  Resp 16  Ht 5\' 6"  (1.676 m)  Wt 159 lb 8 oz (72.349 kg)  BMI 25.74 kg/m2  SpO2 98%  LMP 01/09/2011  General Appearance:    Alert, cooperative, no distress, appears stated age  Head:    Normocephalic, without obvious abnormality, atraumatic  Eyes:    PERRL, conjunctiva/corneas clear, EOM's intact, fundi    benign, both eyes  Ears:    Normal TM's and external ear canals, both ears  Nose:   Nares normal, septum midline, mucosa normal, no drainage    or sinus tenderness  Throat:   Lips, mucosa, and tongue normal; teeth and gums normal  Neck:   Supple, symmetrical, trachea midline, no adenopathy;    thyroid:  no enlargement/tenderness/nodules; no carotid   bruit or JVD  Back:     Symmetric, no curvature, ROM normal, no CVA tenderness  Lungs:     Clear to auscultation bilaterally, respirations unlabored  Chest Wall:    No tenderness or deformity   Heart:    Regular rate and rhythm, S1 and S2 normal, no murmur, rub   or gallop  Breast:   deferred  Abdomen:     Soft, non-tender, bowel sounds active all four quadrants,    no masses, no organomegaly     Extremities:   Extremities  normal, atraumatic, no cyanosis or edema  Pulses:   2+ and symmetric all extremities  Skin:   Skin color, texture, turgor normal, no rashes or lesions  Lymph nodes:   Cervical, supraclavicular, and axillary nodes normal      Assessment:   Routine general medical examination at a health care facility Annual comprehensive exam was done excluding breast, pelvic and PAP smear. All screenings have been deferred due to her history of cerebral palsy    Updated Medication List Outpatient Encounter Prescriptions as of 06/06/2012  Medication Sig Dispense Refill  . diazepam (DIASTAT ACUDIAL) 10 MG GEL Place 20 mg rectally once.      . diazepam (VALIUM) 5 MG tablet Take one and a half tablet at bedtime as needed.  45 tablet  5  . promethazine (PHENERGAN) 25 MG suppository Place 25 mg rectally every 6 (six) hours as needed.        Marland Kitchen QUEtiapine (SEROQUEL XR) 50 MG TB24 Take 1 tablet (50 mg total) by mouth daily.  90 each  3  . lamoTRIgine (LAMICTAL) 100 MG tablet Take 3 tablets by mouth two times a day.       . [DISCONTINUED] levofloxacin (LEVAQUIN) 500 MG tablet Take 1 tablet (500 mg total) by  mouth daily.  7 tablet  0

## 2012-06-08 DIAGNOSIS — Z0000 Encounter for general adult medical examination without abnormal findings: Secondary | ICD-10-CM | POA: Insufficient documentation

## 2012-06-08 NOTE — Assessment & Plan Note (Signed)
Annual comprehensive exam was done excluding breast, pelvic and PAP smear. All screenings have been deferred due to her history of cerebral palsy

## 2012-06-23 ENCOUNTER — Other Ambulatory Visit: Payer: Self-pay | Admitting: *Deleted

## 2012-06-23 NOTE — Telephone Encounter (Signed)
Received faxed refill request from pharmacy. Last office visit 06/06/12. Is it okay to refill medication?

## 2012-06-25 MED ORDER — QUETIAPINE FUMARATE ER 50 MG PO TB24: 50.0000 mg | ORAL_TABLET | Freq: Every day | ORAL | Status: DC

## 2012-09-10 ENCOUNTER — Encounter: Payer: Self-pay | Admitting: Adult Health

## 2012-09-10 ENCOUNTER — Ambulatory Visit (INDEPENDENT_AMBULATORY_CARE_PROVIDER_SITE_OTHER): Payer: 59 | Admitting: Adult Health

## 2012-09-10 VITALS — BP 110/70 | HR 95 | Temp 98.3°F | Resp 12 | Wt 169.0 lb

## 2012-09-10 DIAGNOSIS — G2581 Restless legs syndrome: Secondary | ICD-10-CM

## 2012-09-10 DIAGNOSIS — Z5181 Encounter for therapeutic drug level monitoring: Secondary | ICD-10-CM

## 2012-09-10 DIAGNOSIS — R451 Restlessness and agitation: Secondary | ICD-10-CM | POA: Insufficient documentation

## 2012-09-10 LAB — BASIC METABOLIC PANEL
CO2: 26 mEq/L (ref 19–32)
Chloride: 103 mEq/L (ref 96–112)
GFR: 137.22 mL/min (ref >60.00)
Glucose, Bld: 97 mg/dL (ref 70–99)
Potassium: 4.6 mEq/L (ref 3.5–5.1)
Sodium: 138 mEq/L (ref 135–145)

## 2012-09-10 LAB — FERRITIN: Ferritin: 14.7 ng/mL (ref 10.0–291.0)

## 2012-09-10 LAB — MAGNESIUM: Magnesium: 2.2 mg/dL (ref 1.5–2.5)

## 2012-09-10 NOTE — Assessment & Plan Note (Signed)
Check ferritin, magnesium, bmet If levels normal, consider gabapentin 100-300 mg q. evening.

## 2012-09-10 NOTE — Patient Instructions (Addendum)
  Please have labs drawn prior to leaving the office.  I am checking a ferritin level, magnesium level and a metabolic panel. If these levels are normal may consider using gabapentin 100-300 mg at bedtime.  I am also drawing a Lamictal level per your request prior to visit with [facility] physician.

## 2012-09-10 NOTE — Progress Notes (Signed)
  Subjective:    Patient ID: Lindsey [name], female    DOB: 11/27/1985, 27 y.o.   MRN: [id number]  HPI  Lindsey [name] is a pleasant 27 year old female with cerebral palsy who presents to clinic with her mother. Mother reports that Dalores is complaining of her legs hurting her at bedtime. She is constantly moving her legs around during sleep secondary to the discomfort. Her mother has been giving Murray ibuprofen 200 mg 2 tablets prior to bedtime. This seems to be helping. Mother reports that she did not give her the ibuprofen one night and noted that Lindsey [name] was constantly moving around and ended up having a seizure.   Current Outpatient Prescriptions on File Prior to Visit  Medication Sig Dispense Refill  . diazepam (DIASTAT ACUDIAL) 10 MG GEL Place 20 mg rectally once.      . diazepam (VALIUM) 5 MG tablet Take one and a half tablet at bedtime as needed.  45 tablet  5  . lamoTRIgine (LAMICTAL) 100 MG tablet Take 3 tablets by mouth two times a day.       . promethazine (PHENERGAN) 25 MG suppository Place 25 mg rectally every 6 (six) hours as needed.        Marland Kitchen QUEtiapine (SEROQUEL XR) 50 MG TB24 Take 1 tablet (50 mg total) by mouth daily.  90 each  2   No current facility-administered medications on file prior to visit.     Review of Systems  Musculoskeletal: Positive for myalgias.       Pain and discomfort in bilateral legs at night.       Objective:   Physical Exam  Constitutional:  Pleasant, cooperative patient with cerebral palsy.  Musculoskeletal: Normal range of motion. She exhibits no edema and no tenderness.  Neurological: She is alert.  Skin: Skin is dry.  Psychiatric: Her behavior is normal.          Assessment & Plan:

## 2012-09-12 ENCOUNTER — Telehealth: Payer: Self-pay | Admitting: *Deleted

## 2012-09-12 NOTE — Telephone Encounter (Signed)
Pt's mother called, checking on lab results. She does not have password for MyChart. Called and advised patient of results. Mother wants to speak with pt's neurologist first before starting Gabapentin.

## 2012-10-27 ENCOUNTER — Other Ambulatory Visit: Payer: Self-pay | Admitting: *Deleted

## 2012-10-28 MED ORDER — DIAZEPAM 5 MG PO TABS: ORAL_TABLET | ORAL | Status: DC

## 2012-10-28 NOTE — Telephone Encounter (Signed)
Faxed script

## 2012-10-28 NOTE — Telephone Encounter (Signed)
Ok to refill,  printed rx  

## 2012-10-30 ENCOUNTER — Ambulatory Visit (INDEPENDENT_AMBULATORY_CARE_PROVIDER_SITE_OTHER): Payer: 59 | Admitting: Adult Health

## 2012-10-30 ENCOUNTER — Encounter: Payer: Self-pay | Admitting: Adult Health

## 2012-10-30 VITALS — BP 108/68 | HR 98 | Temp 98.0°F | Resp 12 | Wt 176.5 lb

## 2012-10-30 DIAGNOSIS — J029 Acute pharyngitis, unspecified: Secondary | ICD-10-CM | POA: Insufficient documentation

## 2012-10-30 MED ORDER — AZITHROMYCIN 250 MG PO TABS: ORAL_TABLET | ORAL | Status: DC

## 2012-10-30 NOTE — Progress Notes (Signed)
  Subjective:    Patient ID: Lindsey [name], female    DOB: [birth year], 27 y.o.   MRN: [id number]  HPI  Patient woke up this morning c/o sore throat. Her mother brought her in to be evaluated. Multiple family members sick.   Current Outpatient Prescriptions on File Prior to Visit  Medication Sig Dispense Refill  . diazepam (DIASTAT ACUDIAL) 10 MG GEL Place 20 mg rectally once.      . diazepam (VALIUM) 5 MG tablet Take one and a half tablet at bedtime as needed.  45 tablet  5  . ibuprofen (ADVIL,MOTRIN) 200 MG tablet Take 400 mg by mouth at bedtime.      . lamoTRIgine (LAMICTAL) 100 MG tablet Take 3 tablets by mouth two times a day.       . promethazine (PHENERGAN) 25 MG suppository Place 25 mg rectally every 6 (six) hours as needed.        Marland Kitchen QUEtiapine (SEROQUEL XR) 50 MG TB24 Take 1 tablet (50 mg total) by mouth daily.  90 each  2   No current facility-administered medications on file prior to visit.     Review of Systems  Constitutional: Negative for fever and chills.  HENT: Positive for sore throat and voice change.   Respiratory: Positive for cough.        Objective:   Physical Exam  Constitutional: She appears well-developed and well-nourished. No distress.  HENT:  Head: Normocephalic and atraumatic.  Cardiovascular: Normal rate, regular rhythm and normal heart sounds.  Exam reveals no gallop.   No murmur heard. Pulmonary/Chest: Effort normal and breath sounds normal. No respiratory distress. She has no wheezes.  Neurological: She is alert. Coordination normal.  Skin: Skin is warm and dry.  Psychiatric: She has a normal mood and affect. Her behavior is normal. Judgment and thought content normal.          Assessment & Plan:

## 2012-10-30 NOTE — Assessment & Plan Note (Signed)
Start Azithromycin. Push fluids. Tylenol for fever. Chloraseptic spray for sore throat. RTC if no improvement in 3-4 days.

## 2012-10-30 NOTE — Patient Instructions (Addendum)
  Start Azithromycin today. Take as directed.  Drink plenty of fluids to stay hydrated.  Tylenol for fever.  NO SHOTS TODAY!!

## 2012-12-15 ENCOUNTER — Ambulatory Visit (INDEPENDENT_AMBULATORY_CARE_PROVIDER_SITE_OTHER): Payer: 59 | Admitting: Adult Health

## 2012-12-15 ENCOUNTER — Encounter: Payer: Self-pay | Admitting: Adult Health

## 2012-12-15 VITALS — BP 108/76 | HR 102 | Resp 14 | Wt 182.5 lb

## 2012-12-15 DIAGNOSIS — L600 Ingrowing nail: Secondary | ICD-10-CM

## 2012-12-15 HISTORY — DX: Ingrowing nail: L60.0

## 2012-12-15 MED ORDER — DOXYCYCLINE HYCLATE 100 MG PO TABS: 100.0000 mg | ORAL_TABLET | Freq: Two times a day (BID) | ORAL | Status: DC

## 2012-12-15 NOTE — Assessment & Plan Note (Signed)
Start doxycycline 100 mg bid x 10 days. If no improvement within 3-4 days will refer to podiatry. May soak in epsom salt and apply neosporin to affected area.

## 2012-12-15 NOTE — Progress Notes (Signed)
  Subjective:    Patient ID: Lindsey [name], female    DOB: 01/05/1986, 27 y.o.   MRN: [id number]  HPI  Patient is a 27 year old female with history of cerebral palsy who was brought in by her mother due to left big toe ingrown toenail. Patient has not had a fever. She will not let her mother touch her toe. Patient's mother denies that there has been any drainage at the site.  Current Outpatient Prescriptions on File Prior to Visit  Medication Sig Dispense Refill  . diazepam (DIASTAT ACUDIAL) 10 MG GEL Place 20 mg rectally once.      . diazepam (VALIUM) 5 MG tablet Take one and a half tablet at bedtime as needed.  45 tablet  5  . gabapentin (NEURONTIN) 100 MG capsule Take 100 mg by mouth at bedtime.      Marland Kitchen ibuprofen (ADVIL,MOTRIN) 200 MG tablet Take 400 mg by mouth at bedtime.      . lamoTRIgine (LAMICTAL) 100 MG tablet Take 3 tablets by mouth two times a day.       . promethazine (PHENERGAN) 25 MG suppository Place 25 mg rectally every 6 (six) hours as needed.        Marland Kitchen QUEtiapine (SEROQUEL XR) 50 MG TB24 Take 1 tablet (50 mg total) by mouth daily.  90 each  2   No current facility-administered medications on file prior to visit.    Review of Systems  Constitutional: Negative for fever.  Skin:       Ingrown nail left great toe       Objective:   Physical Exam  Neurological: She is alert.  Skin:  Nail on Left great toe ingrown on medial aspect. No drainage from area. There is slight erythema and minimal swelling.  Psychiatric: She has a normal mood and affect. Her behavior is normal. Judgment and thought content normal.          Assessment & Plan:

## 2013-02-10 ENCOUNTER — Telehealth: Payer: Self-pay | Admitting: Internal Medicine

## 2013-02-10 NOTE — Telephone Encounter (Signed)
Please advise can patient have flu mist at [facility]?

## 2013-02-10 NOTE — Telephone Encounter (Signed)
This patient's mother called [facility] to ask if we would give this patient the flu mist. Mom stated that Dr. Melina Schools office did not have any flu mist. Mom stated that the flu shot made Lindsey [name] sick last year, and because she has epilepsy, mom said she wants her to have the flu mist. Per our site manager, [facility] cannot agree to give her the flu mist until it has been ok'd by the patient's PCP. Please advise re flu mist vs flu shot. Thank you.

## 2013-02-11 NOTE — Telephone Encounter (Signed)
Ok to give her the flu mist

## 2013-02-11 NOTE — Telephone Encounter (Signed)
Patient mother took her took health department and they refused to give patient shot due to [facility] guide lines stating the mist would make epilepsy worse , patient has also contacted neurologist and is waiting on call back at this time. Patient mother would like to know if you have seen these guidelines, and if she has to have it would you write an order or letter okaying flu mist? Please advise

## 2013-02-11 NOTE — Telephone Encounter (Signed)
I will defer to the neurologist. If he says its ok,  Lindsey [name] can write a letter authorizing  health dept to give it.

## 2013-02-12 NOTE — Telephone Encounter (Signed)
Patient mother notified and the neurologist  Has given okay for flu mist.

## 2013-02-20 NOTE — Telephone Encounter (Signed)
Dr Cyndi Bender at [facility] is neurologist that gave the ok for pt to receive flu mist. Mother spoke w/ MD last week 02/11/13.

## 2013-02-26 ENCOUNTER — Other Ambulatory Visit: Payer: Self-pay | Admitting: *Deleted

## 2013-02-26 MED ORDER — QUETIAPINE FUMARATE ER 50 MG PO TB24: 50.0000 mg | ORAL_TABLET | Freq: Every day | ORAL | Status: DC

## 2013-02-26 NOTE — Telephone Encounter (Signed)
Last non-acute visit was 1/14, refill?

## 2013-03-02 ENCOUNTER — Ambulatory Visit (INDEPENDENT_AMBULATORY_CARE_PROVIDER_SITE_OTHER): Payer: 59 | Admitting: Internal Medicine

## 2013-03-02 ENCOUNTER — Encounter: Payer: Self-pay | Admitting: Internal Medicine

## 2013-03-02 VITALS — BP 108/72 | HR 90 | Temp 97.4°F | Resp 14 | Wt 183.2 lb

## 2013-03-02 DIAGNOSIS — L03039 Cellulitis of unspecified toe: Secondary | ICD-10-CM

## 2013-03-02 DIAGNOSIS — L03031 Cellulitis of right toe: Secondary | ICD-10-CM

## 2013-03-02 MED ORDER — CEPHALEXIN 500 MG PO CAPS: 500.0000 mg | ORAL_CAPSULE | Freq: Three times a day (TID) | ORAL | Status: DC

## 2013-03-02 NOTE — Patient Instructions (Signed)
We are treating paronychia with keflex and salt water soaks  Keep the appt with the podiatrist until you are sure it is better

## 2013-03-03 ENCOUNTER — Encounter: Payer: Self-pay | Admitting: Internal Medicine

## 2013-03-03 NOTE — Progress Notes (Signed)
Patient ID: Lindsey [name], female   DOB: 1985/10/22, 27 y.o.   MRN: [id number]  Patient Active Problem List   Diagnosis Date Noted  . Paronychia of great toe of right foot 03/02/2013  . Ingrown left big toenail 12/15/2012  . Sore throat 10/30/2012  . Restless legs 09/10/2012  . Routine general medical examination at a health care facility 06/08/2012  . Cerebral palsy 05/08/2012  . URI (upper respiratory infection) 05/08/2012  . Sty 02/04/2012  . Pharyngitis with viral syndrome 06/30/2011  . Acneiform dermatitis 06/30/2011  . Seizure disorder, primary 03/23/2011    Subjective:  CC:   Chief Complaint  Patient presents with  . Acute Visit    right great toe    HPI:   Lindsey [name] a 27 y.o. female who presents for evaluation of an ingrown toenail. Lindsey [name] is brought in by her mother for evaluation of ingrown toenail on great toe of left foot. She is not sure how happened. She has not been cutting her daughter's toenails because she knows better. Medial cuticle of great toe on left foot is swollen and red.  toenail appears appears to have been cut down iasymmetrically nto the quick.  She has an appointment with a podiatrist for Monday of next week and wonders if she needs to keep it.   Past Medical History  Diagnosis Date  . Temporal lobectomy behavior syndrome 1994    Right  . Status post VNS (vagus nerve stimulator) placement 2000  . Epilepsy age 27    negative metabolic workup has brain abnormalities by MRI (Dr. Quintin Alto, [facility])  . Arthritis   . Insomnia     Past Surgical History  Procedure Laterality Date  . Tonsillectomy and adenoidectomy  age 77  . Abdominal hysterectomy  2005  . Brain surgery      Temporal Lobe resection  . Craniotomy for temporal lobectomy         The following portions of the patient's history were reviewed and updated as appropriate: Allergies, current medications, and problem list.    Review of Systems:   12 Pt  review of  systems was negative except those addressed in the HPI,     History   Social History  . Marital Status: Single    Spouse Name: N/A    Number of Children: N/A  . Years of Education: N/A   Occupational History  . Not on file.   Social History Main Topics  . Smoking status: Never Smoker   . Smokeless tobacco: Never Used  . Alcohol Use: No  . Drug Use: No  . Sexual Activity: Not on file   Other Topics Concern  . Not on file   Social History Narrative  . No narrative on file    Objective:  Filed Vitals:   03/02/13 1152  BP: 108/72  Pulse: 90  Temp: 97.4 F (36.3 C)  Resp: 14     General appearance: alert, anxious, developmentally delayed   Lungs: clear to auscultation bilaterally Heart: regular rate and rhythm, S1, S2 normal, no murmur, click, rub or gallop Abdomen: soft, non-tender; bowel sounds normal; no masses,  no organomegaly Pulses: 2+ and symmetric Skin: Skin color, texture, turgor normal. No rashes or lesions Ext: swollen red cuticle medial side of left great toe Lymph nodes: Cervical, supraclavicular, and axillary nodes normal.  Assessment and Plan:  Paronychia of great toe of right foot We'll treat empirically with Keflex for 1 week. Recommended soaking toe in warm salt water  twice daily. Recommended to keep appointment with podiatrist on Monday in the event that the toenail is to be removed.   Updated Medication List Outpatient Encounter Prescriptions as of 03/02/2013  Medication Sig Dispense Refill  . diazepam (DIASTAT ACUDIAL) 10 MG GEL Place 20 mg rectally once.      . diazepam (VALIUM) 5 MG tablet Take one and a half tablet at bedtime as needed.  45 tablet  5  . gabapentin (NEURONTIN) 100 MG capsule Take 100 mg by mouth at bedtime.      Marland Kitchen ibuprofen (ADVIL,MOTRIN) 200 MG tablet Take 400 mg by mouth at bedtime.      . lamoTRIgine (LAMICTAL) 100 MG tablet Take 3 tablets by mouth two times a day.       . promethazine (PHENERGAN) 25 MG  suppository Place 25 mg rectally every 6 (six) hours as needed.        Marland Kitchen QUEtiapine (SEROQUEL XR) 50 MG TB24 24 hr tablet Take 1 tablet (50 mg total) by mouth daily.  90 each  2  . cephALEXin (KEFLEX) 500 MG capsule Take 1 capsule (500 mg total) by mouth 3 (three) times daily.  21 capsule  0  . [DISCONTINUED] doxycycline (VIBRA-TABS) 100 MG tablet Take 1 tablet (100 mg total) by mouth 2 (two) times daily.  20 tablet  0   No facility-administered encounter medications on file as of 03/02/2013.     No orders of the defined types were placed in this encounter.    No Follow-up on file.

## 2013-03-03 NOTE — Assessment & Plan Note (Signed)
We'll treat empirically with Keflex for 1 week. Recommended soaking toe in warm salt water twice daily. Recommended to keep appointment with podiatrist on Monday in the event that the toenail is to be removed.

## 2013-03-05 ENCOUNTER — Ambulatory Visit: Payer: 59

## 2013-03-09 ENCOUNTER — Ambulatory Visit: Payer: Self-pay | Admitting: Podiatry

## 2013-03-12 ENCOUNTER — Other Ambulatory Visit: Payer: Self-pay

## 2013-04-14 ENCOUNTER — Telehealth: Payer: Self-pay | Admitting: Internal Medicine

## 2013-04-14 NOTE — Telephone Encounter (Signed)
Dropped off paperwork to be filled out w/ self-addressed envelope to mail paperwork back to mother.

## 2013-04-20 NOTE — Telephone Encounter (Signed)
mailed

## 2013-04-22 ENCOUNTER — Other Ambulatory Visit: Payer: Self-pay | Admitting: Internal Medicine

## 2013-04-22 NOTE — Telephone Encounter (Signed)
Ok to refill,  printed rx  

## 2013-04-23 ENCOUNTER — Encounter: Payer: Self-pay | Admitting: Family Medicine

## 2013-04-23 ENCOUNTER — Ambulatory Visit: Payer: 59 | Admitting: Family Medicine

## 2013-04-23 ENCOUNTER — Ambulatory Visit (INDEPENDENT_AMBULATORY_CARE_PROVIDER_SITE_OTHER): Payer: 59 | Admitting: Family Medicine

## 2013-04-23 VITALS — BP 106/80 | HR 104 | Temp 98.1°F | Ht 66.0 in | Wt 183.2 lb

## 2013-04-23 DIAGNOSIS — J069 Acute upper respiratory infection, unspecified: Secondary | ICD-10-CM

## 2013-04-23 MED ORDER — AMOXICILLIN 500 MG PO CAPS: 1000.0000 mg | ORAL_CAPSULE | Freq: Two times a day (BID) | ORAL | Status: DC

## 2013-04-23 NOTE — Assessment & Plan Note (Signed)
No sign of bacterial infection. Discussed viral timeline, course and care. GIven holiday... Sent in rx to hold for antibitoics unless she is not improving as expected with time.

## 2013-04-23 NOTE — Progress Notes (Signed)
   Subjective:    Patient ID: Lindsey [name], female    DOB: 1985-09-11, 27 y.o.   MRN: [id number]  HPI  27 year old female pt  with cerebral palsy of Dr. Melina Schools presents for 24 hours of  nasal congestion and chest, hoarseness, coughing.  Dry cough, no SOB, no wheeze.  No fever.  No face pain or ear pain.  Nonsmoker.  Has not tried anything over the counter.    Review of Systems  Constitutional: Negative for fever and fatigue.  HENT: Negative for ear pain.   Eyes: Negative for pain.  Respiratory: Negative for chest tightness and shortness of breath.   Cardiovascular: Negative for chest pain, palpitations and leg swelling.  Gastrointestinal: Negative for abdominal pain.  Genitourinary: Negative for dysuria.       Objective:   Physical Exam  Constitutional: Vital signs are normal. She appears well-developed and well-nourished. She is cooperative.  Non-toxic appearance. She does not appear ill. No distress.  HENT:  Head: Normocephalic.  Right Ear: Hearing, tympanic membrane, external ear and ear canal normal. Tympanic membrane is not erythematous, not retracted and not bulging.  Left Ear: Hearing, tympanic membrane, external ear and ear canal normal. Tympanic membrane is not erythematous, not retracted and not bulging.  Nose: Rhinorrhea present. No mucosal edema. Right sinus exhibits no maxillary sinus tenderness and no frontal sinus tenderness. Left sinus exhibits no maxillary sinus tenderness and no frontal sinus tenderness.  Mouth/Throat: Uvula is midline and mucous membranes are normal. Posterior oropharyngeal erythema present. No oropharyngeal exudate or posterior oropharyngeal edema.  Cerumen B ears  Nasal congestion  Eyes: Conjunctivae, EOM and lids are normal. Pupils are equal, round, and reactive to light. Lids are everted and swept, no foreign bodies found.  Neck: Trachea normal and normal range of motion. Neck supple. Carotid bruit is not present. No mass and no  thyromegaly present.  Cardiovascular: Normal rate, regular rhythm, S1 normal, S2 normal, normal heart sounds, intact distal pulses and normal pulses.  Exam reveals no gallop and no friction rub.   No murmur heard. Pulmonary/Chest: Effort normal and breath sounds normal. Not tachypneic. No respiratory distress. She has no decreased breath sounds. She has no wheezes. She has no rhonchi. She has no rales.  Neurological: She is alert.  Skin: Skin is warm, dry and intact. No rash noted.  Psychiatric: Her speech is normal and behavior is normal. Judgment normal. Her mood appears not anxious. Cognition and memory are normal. She does not exhibit a depressed mood.          Assessment & Plan:

## 2013-04-23 NOTE — Progress Notes (Signed)
Pre-visit discussion using our clinic review tool. No additional management support is needed unless otherwise documented below in the visit note.  

## 2013-04-23 NOTE — Patient Instructions (Signed)
Mucinex DM or robitussin  DM for congestion/cough. Tylenol/ibuprofen for sore throat. If not feeling better after 5-7 days of illness, can fill antibiotics. If shortness of breath, severe go to ER.

## 2013-05-20 ENCOUNTER — Ambulatory Visit: Payer: 59 | Admitting: Internal Medicine

## 2013-05-27 ENCOUNTER — Other Ambulatory Visit: Payer: Self-pay | Admitting: Internal Medicine

## 2013-06-03 ENCOUNTER — Ambulatory Visit: Payer: 59 | Admitting: Internal Medicine

## 2013-06-04 ENCOUNTER — Encounter: Payer: Self-pay | Admitting: Internal Medicine

## 2013-06-04 ENCOUNTER — Ambulatory Visit (INDEPENDENT_AMBULATORY_CARE_PROVIDER_SITE_OTHER): Payer: 59 | Admitting: Internal Medicine

## 2013-06-04 VITALS — BP 130/80 | HR 106 | Temp 97.8°F | Ht 66.0 in | Wt 183.8 lb

## 2013-06-04 DIAGNOSIS — J069 Acute upper respiratory infection, unspecified: Secondary | ICD-10-CM

## 2013-06-04 DIAGNOSIS — Z01818 Encounter for other preprocedural examination: Secondary | ICD-10-CM

## 2013-06-04 NOTE — Progress Notes (Signed)
Patient ID: Lindsey [name], female   DOB: 06-Jun-1985, 28 y.o.   MRN: [id number]    Subjective:    Lindsey [name] is a 28 y.o. female who presents to the office today for a preoperative consultation at the request of her oral surgeon who plans on performing a tooth etraction  on February 25. This consultation is requested for the specific conditions prompting preoperative evaluation (i.e. because of potential affect on operative risk): Marland Kitchen Planned anesthesia: general. The patient has the following known anesthesia issues: none. Patients bleeding risk: no recent abnormal bleeding. Patient does not have objections to receiving blood products if needed.  Medical clearance for dental procedure,  Getting a filling replaced ,  Will require general anethesia bc of her cognitive issues. Patient refuses EKG and chest x ray  ,so plans are for EKG to be done prior to procedure once sedated.   URI symptoms started yesterday.  Had a seizure this morning,.  NO fevers,  Some cough,  No ear pain  The following portions of the patient's history were reviewed and updated as appropriate: allergies, current medications, past family history, past medical history, past social history, past surgical history and problem list.  Review of Systems A comprehensive review of systems was negative.    Objective:    BP 130/80  Pulse 106  Temp(Src) 97.8 F (36.6 C) (Axillary)  Ht 5\' 6"  (1.676 m)  Wt 183 lb 12 oz (83.348 kg)  BMI 29.67 kg/m2  SpO2 95%  LMP 01/09/2011  General Appearance:    Alert, cooperative, no distress, appears stated age  Head:    Normocephalic, without obvious abnormality, atraumatic  Eyes:    PERRL, conjunctiva/corneas clear, EOM's intact, fundi    benign, both eyes  Ears:    Cerumen impaction on the left, right TM normal.   Nose:   Nares normal, septum midline, mucosa normal, no drainage    or sinus tenderness  Throat:   Lips, mucosa, and tongue normal; teeth and gums normal  Neck:    Supple, symmetrical, trachea midline, no adenopathy;    thyroid:  no enlargement/tenderness/nodules; no carotid   bruit or JVD  Back:     Symmetric, no curvature, ROM normal, no CVA tenderness  Lungs:     Clear to auscultation bilaterally, respirations unlabored  Chest Wall:    No tenderness or deformity   Heart:    Tachycardic but regular  rhythm, S1 and S2 normal, no murmur, rub   or gallop     Abdomen:     Soft, non-tender, bowel sounds active all four quadrants,    no masses, no organomegaly  Extremities:   Extremities normal, atraumatic, no cyanosis or edema  Pulses:   2+ and symmetric all extremities  Skin:   Skin color, texture, turgor normal, no rashes or lesions  Lymph nodes:   Cervical, supraclavicular, and axillary nodes normal  Neurologic:   CNII-XII intact, normal strength, sensation and reflexes    throughout    Predictors of intubation difficulty:  Morbid obesity? no  Anatomically abnormal facies? no  Prominent incisors? no  Receding mandible? no  Short, thick neck? no  Neck range of motion: normal   Dentition: No chipped, loose, or missing teeth.  Cardiographics ECG: no prior ECG Echocardiogram: not done  Imaging Chest x-ray: not done   Lab Review  No visits with results within 2 Month(s) from this visit. Latest known visit with results is:  Office Visit on 09/10/2012  Component Date Value  .  Ferritin 09/10/2012 14.7   . Magnesium 09/10/2012 2.2   . Lamotrigine Lvl 09/10/2012 13.5   . Sodium 09/10/2012 138   . Potassium 09/10/2012 4.6   . Chloride 09/10/2012 103   . CO2 09/10/2012 26   . Glucose, Bld 09/10/2012 97   . BUN 09/10/2012 8   . Creatinine, Ser 09/10/2012 0.6   . Calcium 09/10/2012 9.7   . GFR 09/10/2012 137.22       Assessment and Plan:     URI (upper respiratory infection) Symptoms of  URI are caused by viral infection currently given her current symptoms.   I have explained that in viral URIS, an antibiotic will not help the  symptoms and will increase the risk of developing diarrhea. Advised to use oral and nasal decongestants,  Ibuprofen 400 mg and tylenol 650 mq 8 hrs for aches and pains,  tessalon every 8 hours prn cough  Advised to start round of abx only if symptoms worsen to include fevers, facial pain, purulent sputum./drainage.   Preoperative general physical examination    28 y.o. female with planned surgery as above.   Known risk factors for perioperative complications: None seziure disorder   Difficulty with intubation is not anticipated.  Cardiac Risk Estimation: low  Current medications which may produce withdrawal symptoms if withheld perioperatively: none   Patient was uncooperative with attempt to obtain EKG.  She has no history of heart disease.  EKG will be done once she is sedated.             Updated Medication List Outpatient Encounter Prescriptions as of 06/04/2013  Medication Sig  . diazepam (DIASTAT ACUDIAL) 10 MG GEL Place 20 mg rectally once.  . diazepam (VALIUM) 5 MG tablet TAKE 1 AND 1/2 TABLET AT BEDTIME AS NEEDED  . gabapentin (NEURONTIN) 100 MG capsule Take 100 mg by mouth at bedtime.  . lamoTRIgine (LAMICTAL) 100 MG tablet Take 3 tablets by mouth two times a day.   Marland Kitchen. QUEtiapine (SEROQUEL XR) 50 MG TB24 24 hr tablet Take 1 tablet (50 mg total) by mouth daily.  . [DISCONTINUED] amoxicillin (AMOXIL) 500 MG capsule Take 2 capsules (1,000 mg total) by mouth 2 (two) times daily.

## 2013-06-04 NOTE — Progress Notes (Signed)
Pre-visit discussion using our clinic review tool. No additional management support is needed unless otherwise documented below in the visit note.  

## 2013-06-05 ENCOUNTER — Telehealth: Payer: Self-pay | Admitting: Internal Medicine

## 2013-06-05 MED ORDER — LEVOFLOXACIN 500 MG PO TABS: 500.0000 mg | ORAL_TABLET | Freq: Every day | ORAL | Status: DC

## 2013-06-05 NOTE — Telephone Encounter (Signed)
States pt was seen yesterday.  Was told at appt if pt was still coughing and feeling bad to call back for antibiotic.  Mother states pt does need the antibiotic and they do not want to go through the weekend without one.  Asking for a call when this is done.

## 2013-06-05 NOTE — Telephone Encounter (Signed)
Please advise 

## 2013-06-05 NOTE — Telephone Encounter (Signed)
levaquin sent to pharmacy Please take a probiotic ( Align, Floraque or Culturelle) while you are on the antibiotic to prevent a serious antibiotic associated diarrhea  Called clostirudium dificile colitis and a vaginal yeast infection

## 2013-06-05 NOTE — Telephone Encounter (Signed)
Spoke to pt's mother Lindsey [name], told her antibiotic was sent to pharmacy and Dr. Darrick Huntsmanullo would like her to take a probiotic ( Align, Fl[place] or [place]) while you are on the antibiotic to prevent a serious antibiotic associated diarrhea Called clostirudium dificile colitis and a vaginal yeast infection. Lindsey [name] verbalized understanding and will tell pt.

## 2013-06-06 DIAGNOSIS — Z01818 Encounter for other preprocedural examination: Secondary | ICD-10-CM | POA: Insufficient documentation

## 2013-06-06 NOTE — Assessment & Plan Note (Signed)
HEENT Exam is normal except for cerumen impaction on the right,lungs clear  CV; tachycardic, regular.

## 2013-06-06 NOTE — Assessment & Plan Note (Signed)
Symptoms of  URI are caused by viral infection currently given her current symptoms.   I have explained that in viral URIS, an antibiotic will not help the symptoms and will increase the risk of developing diarrhea. Advised to use oral and nasal decongestants,  Ibuprofen 400 mg and tylenol 650 mq 8 hrs for aches and pains,  tessalon every 8 hours prn cough  Advised to start round of abx only if symptoms worsen to include fevers, facial pain, purulent sputum./drainage.  

## 2013-06-09 ENCOUNTER — Telehealth: Payer: Self-pay | Admitting: Internal Medicine

## 2013-06-09 NOTE — Telephone Encounter (Signed)
Mom dropped off community support service form to bill filled Mom stated you could mail form back to her  She left self address envelope In box

## 2013-06-11 NOTE — Telephone Encounter (Signed)
In red folder. 

## 2013-07-27 ENCOUNTER — Telehealth: Payer: Self-pay | Admitting: Internal Medicine

## 2013-07-27 NOTE — Telephone Encounter (Signed)
Paperwork dropped off regarding [employer] to be completed by Dr. Darrick Huntsmanullo.  Placed in Dr. Melina Schoolsullo's box.

## 2013-07-27 NOTE — Telephone Encounter (Signed)
Placed in red folder  

## 2013-08-19 ENCOUNTER — Ambulatory Visit (INDEPENDENT_AMBULATORY_CARE_PROVIDER_SITE_OTHER): Payer: 59 | Admitting: Adult Health

## 2013-08-19 ENCOUNTER — Encounter: Payer: Self-pay | Admitting: Adult Health

## 2013-08-19 VITALS — BP 112/72 | HR 118 | Temp 98.3°F | Wt 184.0 lb

## 2013-08-19 DIAGNOSIS — L988 Other specified disorders of the skin and subcutaneous tissue: Secondary | ICD-10-CM

## 2013-08-19 DIAGNOSIS — R238 Other skin changes: Secondary | ICD-10-CM | POA: Insufficient documentation

## 2013-08-19 NOTE — Progress Notes (Signed)
Pre visit review using our clinic review tool, if applicable. No additional management support is needed unless otherwise documented below in the visit note. 

## 2013-08-19 NOTE — Progress Notes (Signed)
Patient ID: Lindsey [name], female   DOB: 08-14-1985, 28 y.o.   MRN: [id number]    Subjective:    Patient ID: Lindsey [name], female    DOB: 08-14-1985, 28 y.o.   MRN: [id number]  HPI Lindsey [name] is a pleasant 28 y/o female with hx of cerebral palsy who presents to clinic with her mother/caregiver. Lindsey [name] has an area on her left inner upper thigh that her mother would like evaluated. It is red and swollen. Denies any insect bites. No fever, chills. Mom has been keeping the area clean and applying neosporin.   Past Medical History  Diagnosis Date  . Temporal lobectomy behavior syndrome 1994    Right  . Status post VNS (vagus nerve stimulator) placement 2000  . Epilepsy age 16    negative metabolic workup has brain abnormalities by MRI (Dr. Quintin Altoadtke, [facility])  . Arthritis   . Insomnia     Current Outpatient Prescriptions on File Prior to Visit  Medication Sig Dispense Refill  . diazepam (DIASTAT ACUDIAL) 10 MG GEL Place 20 mg rectally once.      . diazepam (VALIUM) 5 MG tablet TAKE 1 AND 1/2 TABLET AT BEDTIME AS NEEDED  45 tablet  5  . gabapentin (NEURONTIN) 100 MG capsule Take 100 mg by mouth at bedtime.      . lamoTRIgine (LAMICTAL) 100 MG tablet Take 3 tablets by mouth two times a day.       . levofloxacin (LEVAQUIN) 500 MG tablet Take 1 tablet (500 mg total) by mouth daily.  7 tablet  0  . QUEtiapine (SEROQUEL XR) 50 MG TB24 24 hr tablet Take 1 tablet (50 mg total) by mouth daily.  90 each  2   No current facility-administered medications on file prior to visit.     Review of Systems  Constitutional: Negative for fever and chills.  Respiratory: Negative.   Cardiovascular: Negative.   Skin:       Pimple on the left inner thigh  Neurological: Negative.   All other systems reviewed and are negative.      Objective:  LMP 01/09/2011   Physical Exam  Constitutional: No distress.  Cardiovascular: Normal rate and regular rhythm.   Pulmonary/Chest: Effort normal. No  respiratory distress.  Neurological: She is alert.  Skin: There is erythema.  Small area approximately 1/2 cm that is red and slightly raised. Does not appear infected. No drainage. No induration. Secondary lesion possibly due to pt scratching.  Psychiatric:  Somewhat cooperative. Did not want to put on a gown to be assessed.       Assessment & Plan:   1. Papule Area on left inner upper thigh is slightly erythematous. No drainage. Does not appear infected although does appear as secondary lesion from pt manipulation. Suspect she may have been bit by mosquito and she began scratching. Continue neosporin. Discussed with mom what s/s to look for and to report. RTC prn

## 2013-08-25 ENCOUNTER — Telehealth: Payer: Self-pay | Admitting: *Deleted

## 2013-08-25 ENCOUNTER — Other Ambulatory Visit: Payer: Self-pay | Admitting: Adult Health

## 2013-08-25 MED ORDER — DOXYCYCLINE HYCLATE 100 MG PO TABS: 100.0000 mg | ORAL_TABLET | Freq: Two times a day (BID) | ORAL | Status: DC

## 2013-08-25 NOTE — Telephone Encounter (Signed)
Pt's mother called stating that the red spot on pt's leg that you seen on 08/19/13 is still inflamed, asking for you to call her in something for it

## 2013-08-25 NOTE — Telephone Encounter (Signed)
Notified pt. 

## 2013-08-25 NOTE — Telephone Encounter (Signed)
Sent in to pharmacy - doxycycline 1 capsule twice a day for 7 days. If no improvement with this she will need to be re-evaluated

## 2013-10-16 ENCOUNTER — Other Ambulatory Visit: Payer: Self-pay | Admitting: Internal Medicine

## 2013-10-16 NOTE — Telephone Encounter (Signed)
Ok refill? 

## 2013-11-18 ENCOUNTER — Encounter: Payer: Self-pay | Admitting: Internal Medicine

## 2013-11-18 ENCOUNTER — Other Ambulatory Visit: Payer: Self-pay | Admitting: Internal Medicine

## 2013-11-18 ENCOUNTER — Ambulatory Visit (INDEPENDENT_AMBULATORY_CARE_PROVIDER_SITE_OTHER): Payer: 59 | Admitting: Internal Medicine

## 2013-11-18 VITALS — BP 120/80 | HR 102 | Temp 97.5°F | Ht 66.0 in | Wt 183.0 lb

## 2013-11-18 DIAGNOSIS — L0100 Impetigo, unspecified: Secondary | ICD-10-CM

## 2013-11-18 MED ORDER — CEPHALEXIN 500 MG PO CAPS: 500.0000 mg | ORAL_CAPSULE | Freq: Three times a day (TID) | ORAL | Status: DC

## 2013-11-18 NOTE — Telephone Encounter (Signed)
Last refill 6.12.15, last OV 4.15.15.  Please advise refill.

## 2013-11-18 NOTE — Progress Notes (Signed)
Pre visit review using our clinic review tool, if applicable. No additional management support is needed unless otherwise documented below in the visit note. 

## 2013-11-18 NOTE — Telephone Encounter (Signed)
Last refill 6.12.15, last OV 4.15.15, next OV 7.15.15.  Please review refill request at OV today.

## 2013-11-20 NOTE — Telephone Encounter (Signed)
Ok to refill,  Authorized in epic 

## 2013-11-20 NOTE — Telephone Encounter (Signed)
Ok to refill,  Refill sent  

## 2013-11-21 DIAGNOSIS — L0100 Impetigo, unspecified: Secondary | ICD-10-CM | POA: Insufficient documentation

## 2013-11-21 NOTE — Assessment & Plan Note (Signed)
Empiric treatment with keflex

## 2013-11-21 NOTE — Progress Notes (Signed)
Patient ID: Lindsey [name], female   DOB: [birth year], 28 y.o.   MRN: [id number]   Patient Active Problem List   Diagnosis Date Noted  . Impetigo 11/21/2013  . Preoperative general physical examination 06/06/2013  . Paronychia of great toe of right foot 03/02/2013  . Ingrown left big toenail 12/15/2012  . Sore throat 10/30/2012  . Restless legs 09/10/2012  . Routine general medical examination at a health care facility 06/08/2012  . Cerebral palsy 05/08/2012  . Sty 02/04/2012  . Acneiform dermatitis 06/30/2011  . Seizure disorder, primary 03/23/2011    Subjective:  CC:   Chief Complaint  Patient presents with  . Rash    right lower cheek of face-noticed last night    HPI:   Lindsey PalmsDanielle E [name] is a 28 y.o. female who presents for Sudden onset of red pustular scaling rash on right side of face along the jaw line.  Possible history of bug bites,  Mother is not sure, no sick contacts but patient  does spend time in daycare. No fevers or lymphadenopathy.     Past Medical History  Diagnosis Date  . Temporal lobectomy behavior syndrome 1994    Right  . Status post VNS (vagus nerve stimulator) placement 2000  . Epilepsy age 69    negative metabolic workup has brain abnormalities by MRI (Dr. Quintin Altoadtke, [facility])  . Arthritis   . Insomnia     Past Surgical History  Procedure Laterality Date  . Tonsillectomy and adenoidectomy  age 89  . Abdominal hysterectomy  2005  . Brain surgery      Temporal Lobe resection  . Craniotomy for temporal lobectomy         The following portions of the patient's history were reviewed and updated as appropriate: Allergies, current medications, and problem list.    Review of Systems:   Patient unable to provide history due to baseline cognitive dysfunction secondary to cerebral palsy,  Per mother she has not endorsed headache, fevers, malaise, unintentional weight loss, skin rash, eye pain, sinus congestion and sinus pain, sore throat, dysphagia,   hemoptysis , cough, dyspnea, wheezing, chest pain, palpitations, orthopnea, edema, abdominal pain, nausea, melena, diarrhea, constipation, flank pain, dysuria, hematuria, urinary  Frequency, nocturia, numbness, tingling, seizures,  Focal weakness, Loss of consciousness,  Tremor, insomnia,   History   Social History  . Marital Status: Single    Spouse Name: N/A    Number of Children: N/A  . Years of Education: N/A   Occupational History  . Not on file.   Social History Main Topics  . Smoking status: Never Smoker   . Smokeless tobacco: Never Used  . Alcohol Use: No  . Drug Use: No  . Sexual Activity: Not on file   Other Topics Concern  . Not on file   Social History Narrative  . No narrative on file    Objective:  Filed Vitals:   11/18/13 1440  BP: 120/80  Pulse: 102  Temp: 97.5 F (36.4 C)     General appearance: alert, cooperative and appears stated age Ears: normal TM's and external ear canals both ears Throat: lips, mucosa, and tongue normal; teeth and gums normal Neck: no adenopathy, no carotid bruit, supple, symmetrical, trachea midline and thyroid not enlarged, symmetric, no tenderness/mass/nodules Skin:  3 honey crusted papules on right side of jaw line with some erythema and scaling. Assessment and Plan:  Impetigo Empiric treatment with keflex   Updated Medication List Outpatient Encounter Prescriptions as of 11/18/2013  Medication Sig  . diazepam (DIASTAT ACUDIAL) 10 MG GEL Place 20 mg rectally once.  . gabapentin (NEURONTIN) 100 MG capsule Take 100 mg by mouth at bedtime.  . lamoTRIgine (LAMICTAL) 100 MG tablet Take 3 tablets by mouth two times a day.   . [DISCONTINUED] diazepam (VALIUM) 5 MG tablet TAKE 1 1/2 TABLETS AT BEDTIME AS NEEDED  . [DISCONTINUED] QUEtiapine (SEROQUEL XR) 50 MG TB24 24 hr tablet Take 1 tablet (50 mg total) by mouth daily.  . cephALEXin (KEFLEX) 500 MG capsule Take 1 capsule (500 mg total) by mouth 3 (three) times daily.  .  [DISCONTINUED] doxycycline (VIBRA-TABS) 100 MG tablet Take 1 tablet (100 mg total) by mouth 2 (two) times daily.     No orders of the defined types were placed in this encounter.    No Follow-up on file.

## 2014-03-22 ENCOUNTER — Ambulatory Visit (INDEPENDENT_AMBULATORY_CARE_PROVIDER_SITE_OTHER): Payer: 59 | Admitting: Internal Medicine

## 2014-03-22 ENCOUNTER — Encounter: Payer: Self-pay | Admitting: Internal Medicine

## 2014-03-22 VITALS — BP 104/76 | HR 89 | Temp 97.7°F | Wt 181.0 lb

## 2014-03-22 DIAGNOSIS — H5712 Ocular pain, left eye: Secondary | ICD-10-CM

## 2014-03-22 NOTE — Progress Notes (Signed)
Pre visit review using our clinic review tool, if applicable. No additional management support is needed unless otherwise documented below in the visit note. 

## 2014-03-22 NOTE — Progress Notes (Signed)
Subjective:    Patient ID: Lindsey [name], female    DOB: 1985/09/04, 28 y.o.   MRN: [id number]  HPI  Pt presents to the clinic today with c/o left eye pain. She reports this started yesterday. She denies blurred vision, eye redness or discharge from the eye. She reports that it is actually improved today. She denies any other URI symptoms. She denies trauma to the eye or possible foreign body.  Review of Systems  Past Medical History  Diagnosis Date  . Temporal lobectomy behavior syndrome 1994    Right  . Status post VNS (vagus nerve stimulator) placement 2000  . Epilepsy age 56    negative metabolic workup has brain abnormalities by MRI (Dr. Quintin Altoadtke, [facility])  . Arthritis   . Insomnia     Current Outpatient Prescriptions  Medication Sig Dispense Refill  . diazepam (DIASTAT ACUDIAL) 10 MG GEL Place 20 mg rectally once.    . diazepam (VALIUM) 5 MG tablet 1 AND 1/2 TABLETS BY MOUTH AT BEDTIME ASNEEDED 45 tablet 5  . gabapentin (NEURONTIN) 100 MG capsule Take 100 mg by mouth at bedtime.    . lamoTRIgine (LAMICTAL) 100 MG tablet Take 3 tablets by mouth two times a day.     . SEROQUEL XR 50 MG TB24 24 hr tablet TAKE 1 TABLET BY MOUTH DAILY 90 each 2   No current facility-administered medications for this visit.    Allergies  Allergen Reactions  . Influenza Virus Vaccine Split Other (See Comments)    48 hours of fever, myalgias   . Benadryl [Diphenhydramine Hcl]     Seizure  . Zyrtec [Cetirizine Hcl]     Seizure     History reviewed. No pertinent family history.  History   Social History  . Marital Status: Single    Spouse Name: N/A    Number of Children: N/A  . Years of Education: N/A   Occupational History  . Not on file.   Social History Main Topics  . Smoking status: Never Smoker   . Smokeless tobacco: Never Used  . Alcohol Use: No  . Drug Use: No  . Sexual Activity: Not on file   Other Topics Concern  . Not on file   Social History Narrative      Constitutional: Denies fever, malaise, fatigue, headache or abrupt weight changes.  HEENT: Pt reports left eye pain. Denies eye redness, ear pain, ringing in the ears, wax buildup, runny nose, nasal congestion, bloody nose, or sore throat. Respiratory: Denies difficulty breathing, shortness of breath, cough or sputum production.    No other specific complaints in a complete review of systems (except as listed in HPI above).     Objective:   Physical Exam  BP 104/76 mmHg  Pulse 89  Temp(Src) 97.7 F (36.5 C) (Axillary)  Wt 181 lb (82.101 kg)  SpO2 98%  LMP 01/09/2011 Wt Readings from Last 3 Encounters:  03/22/14 181 lb (82.101 kg)  11/18/13 183 lb (83.008 kg)  08/19/13 184 lb (83.462 kg)    General: Appears her stated age, well developed, well nourished in NAD.  HEENT: Head: normal shape and size; Eyes: sclera white, no icterus. She will not let me open her lid to examine the conjunctiva. She squints when I try to open her eye. Will not follow commands to test EOM's.   Cardiovascular: Normal rate and rhythm. S1,S2 noted.  No murmur, rubs or gallops noted.  Pulmonary/Chest: Normal effort and positive vesicular breath sounds. No respiratory  distress. No wheezes, rales or ronchi noted.   BMET    Component Value Date/Time   NA 138 09/10/2012 1035   K 4.6 09/10/2012 1035   CL 103 09/10/2012 1035   CO2 26 09/10/2012 1035   GLUCOSE 97 09/10/2012 1035   BUN 8 09/10/2012 1035   CREATININE 0.6 09/10/2012 1035   CALCIUM 9.7 09/10/2012 1035        Assessment & Plan:   Left eye pain:  Does not appear to be conjunctivitis Advised her to try ibuprofen and warm compresses for comfort Discussed s/s of bacterial conjunctivitis  RTC as needed or if you notice s/s of conjunctivitis

## 2014-03-22 NOTE — Patient Instructions (Signed)
Viral Conjunctivitis Conjunctivitis is an irritation (inflammation) of the clear membrane that covers the white part of the eye (the conjunctiva). The irritation can also happen on the underside of the eyelids. Conjunctivitis makes the eye red or pink in color. This is what is commonly known as pink eye. Viral conjunctivitis can spread easily (contagious). CAUSES   Infection from virus on the surface of the eye.  Infection from the irritation or injury of nearby tissues such as the eyelids or cornea.  More serious inflammation or infection on the inside of the eye.  Other eye diseases.  The use of certain eye medications. SYMPTOMS  The normally white color of the eye or the underside of the eyelid is usually pink or red in color. The pink eye is usually associated with irritation, tearing and some sensitivity to light. Viral conjunctivitis is often associated with a clear, watery discharge. If a discharge is present, there may also be some blurred vision in the affected eye. DIAGNOSIS  Conjunctivitis is diagnosed by an eye exam. The eye specialist looks for changes in the surface tissues of the eye which take on changes characteristic of the specific types of conjunctivitis. A sample of any discharge may be collected on a Q-Tip (sterile swap). The sample will be sent to a lab to see whether or not the inflammation is caused by bacterial or viral infection. TREATMENT  Viral conjunctivitis will not respond to medicines that kill germs (antibiotics). Treatment is aimed at stopping a bacterial infection on top of the viral infection. The goal of treatment is to relieve symptoms (such as itching) with antihistamine drops or other eye medications.  HOME CARE INSTRUCTIONS   To ease discomfort, apply a cool, clean wash cloth to your eye for 10 to 20 minutes, 3 to 4 times a day.  Gently wipe away any drainage from the eye with a warm, wet washcloth or a cotton ball.  Wash your hands often with soap  and use paper towels to dry.  Do not share towels or washcloths. This may spread the infection.  Change or wash your pillowcase every day.  You should not use eye make-up until the infection is gone.  Stop using contacts lenses. Ask your eye professional how to sterilize or replace them before using again. This depends on the type of contact lenses used.  Do not touch the edge of the eyelid with the eye drop bottle or ointment tube when applying medications to the affected eye. This will stop you from spreading the infection to the other eye or to others. SEEK IMMEDIATE MEDICAL CARE IF:   The infection has not improved within 3 days of beginning treatment.  A watery discharge from the eye develops.  Pain in the eye increases.  The redness is spreading.  Vision becomes blurred.  An oral temperature above 102 F (38.9 C) develops, or as your caregiver suggests.  Facial pain, redness or swelling develops.  Any problems that may be related to the prescribed medicine develop. MAKE SURE YOU:   Understand these instructions.  Will watch your condition.  Will get help right away if you are not doing well or get worse. Document Released: 04/23/2005 Document Revised: 07/16/2011 Document Reviewed: 12/11/2007 ExitCare Patient Information 2015 [employer]. This information is not intended to replace advice given to you by your health care provider. Make sure you discuss any questions you have with your health care provider.  

## 2014-03-25 ENCOUNTER — Other Ambulatory Visit: Payer: Self-pay | Admitting: Internal Medicine

## 2014-03-25 DIAGNOSIS — H00029 Hordeolum internum unspecified eye, unspecified eyelid: Secondary | ICD-10-CM

## 2014-03-25 MED ORDER — POLYMYXIN B-TRIMETHOPRIM 10000-0.1 UNIT/ML-% OP SOLN: 1.0000 [drp] | OPHTHALMIC | Status: AC

## 2014-04-19 ENCOUNTER — Ambulatory Visit (INDEPENDENT_AMBULATORY_CARE_PROVIDER_SITE_OTHER): Payer: 59 | Admitting: Internal Medicine

## 2014-04-19 ENCOUNTER — Encounter: Payer: Self-pay | Admitting: Internal Medicine

## 2014-04-19 VITALS — BP 138/90 | HR 92 | Temp 97.7°F | Resp 16

## 2014-04-19 DIAGNOSIS — N390 Urinary tract infection, site not specified: Secondary | ICD-10-CM

## 2014-04-19 DIAGNOSIS — R319 Hematuria, unspecified: Secondary | ICD-10-CM

## 2014-04-19 DIAGNOSIS — R1110 Vomiting, unspecified: Secondary | ICD-10-CM

## 2014-04-19 LAB — POCT URINALYSIS DIPSTICK
Bilirubin, UA: NEGATIVE
GLUCOSE UA: NEGATIVE
Ketones, UA: 15
Nitrite, UA: NEGATIVE
Protein, UA: 100
SPEC GRAV UA: 1.025
UROBILINOGEN UA: 0.2
pH, UA: 7

## 2014-04-19 MED ORDER — PROMETHAZINE HCL 25 MG RE SUPP: 25.0000 mg | Freq: Four times a day (QID) | RECTAL | Status: DC | PRN

## 2014-04-19 MED ORDER — SULFAMETHOXAZOLE-TRIMETHOPRIM 800-160 MG PO TABS: 1.0000 | ORAL_TABLET | Freq: Two times a day (BID) | ORAL | Status: DC

## 2014-04-19 NOTE — Progress Notes (Signed)
Pre-visit discussion using our clinic review tool. No additional management support is needed unless otherwise documented below in the visit note.  

## 2014-04-19 NOTE — Progress Notes (Signed)
Patient ID: Lindsey [name], female   DOB: 03-11-1986, 28 y.o.   MRN: [id number]  Patient Active Problem List   Diagnosis Date Noted  . Uncontrollable nausea and vomiting 04/20/2014  . Impetigo 11/21/2013  . Preoperative general physical examination 06/06/2013  . Paronychia of great toe of right foot 03/02/2013  . Ingrown left big toenail 12/15/2012  . Sore throat 10/30/2012  . Restless legs 09/10/2012  . Routine general medical examination at a health care facility 06/08/2012  . Cerebral palsy 05/08/2012  . Sty 02/04/2012  . Acneiform dermatitis 06/30/2011  . Seizure disorder, primary 03/23/2011    Subjective:  CC:   Chief Complaint  Patient presents with  . Emesis    firts symptom was could not urinate and then emesis started and then vomiting started.  . Urinary Tract Infection    HPI:   Lindsey [name] is a 28 y.o. female who presents for possible UTi per mother.  recurrent vomiting for the past 3 hours.   Patient accompanied by mother who is primary caregiver for adult patient, states that patient woke up unable to urinate despite multiple trips to the BR.  She ate breakfast around 8:00 then started vomiting at 9 am and hasn't stopped,  Despite Mom giving her a zofran at  9:45 am .    UA positive for blood and leukocytes but sample was too small to run a culture or microscopy .   Patient unable to provide history on a normal day due to chronic condition of CP .   Past Medical History  Diagnosis Date  . Temporal lobectomy behavior syndrome 1994    Right  . Status post VNS (vagus nerve stimulator) placement 2000  . Epilepsy age 69    negative metabolic workup has brain abnormalities by MRI (Dr. Quintin Altoadtke, [facility])  . Arthritis   . Insomnia     Past Surgical History  Procedure Laterality Date  . Tonsillectomy and adenoidectomy  age 89  . Abdominal hysterectomy  2005  . Brain surgery      Temporal Lobe resection  . Craniotomy for temporal lobectomy         The  following portions of the patient's history were reviewed and updated as appropriate: Allergies, current medications, and problem list.    Review of Systems:   Patient denies headache, fevers, malaise, unintentional weight loss, skin rash, eye pain, sinus congestion and sinus pain, sore throat, dysphagia,  hemoptysis , cough, dyspnea, wheezing, chest pain, palpitations, orthopnea, edema, abdominal pain, nausea, melena, diarrhea, constipation, flank pain, dysuria, hematuria, urinary  Frequency, nocturia, numbness, tingling, seizures,  Focal weakness, Loss of consciousness,  Tremor, insomnia, depression, anxiety, and suicidal ideation.     History   Social History  . Marital Status: Single    Spouse Name: N/A    Number of Children: N/A  . Years of Education: N/A   Occupational History  . Not on file.   Social History Main Topics  . Smoking status: Never Smoker   . Smokeless tobacco: Never Used  . Alcohol Use: No  . Drug Use: No  . Sexual Activity: Not on file   Other Topics Concern  . Not on file   Social History Narrative    Objective:  Filed Vitals:   04/19/14 1121  BP: 138/90  Pulse: 92  Temp: 97.7 F (36.5 C)  Resp: 16     General appearance: patient in distress due to recurrent vomiting   Back: symmetric, no curvature. ROM normal.  No CVA tenderness. Lungs: clear to auscultation bilaterally Heart: regular rate and rhythm, S1, S2 normal, no murmur, click, rub or gallop Abdomen: soft, diffusely tender; bowel sounds normal; no masses,  no organomegaly .  Assessment and Plan:  Uncontrollable nausea and vomiting May be secondary to UTI but cannot confirm without additional urine specimen and adding abx at this time not advised until n/v resolved. Phenergan suppositories prescribed .  If unable to stop  Vomiting will need ER evaluation    Updated Medication List Outpatient Encounter Prescriptions as of 04/19/2014  Medication Sig  . diazepam (DIASTAT ACUDIAL)  10 MG GEL Place 20 mg rectally once.  . diazepam (VALIUM) 5 MG tablet 1 AND 1/2 TABLETS BY MOUTH AT BEDTIME ASNEEDED  . gabapentin (NEURONTIN) 100 MG capsule Take 100 mg by mouth at bedtime.  . lamoTRIgine (LAMICTAL) 100 MG tablet Take 3 tablets by mouth two times a day.   . SEROQUEL XR 50 MG TB24 24 hr tablet TAKE 1 TABLET BY MOUTH DAILY  . promethazine (PHENERGAN) 25 MG suppository Place 1 suppository (25 mg total) rectally every 6 (six) hours as needed for nausea or vomiting.  . sulfamethoxazole-trimethoprim (SEPTRA DS) 800-160 MG per tablet Take 1 tablet by mouth 2 (two) times daily.     Orders Placed This Encounter  Procedures  . CULTURE, URINE COMPREHENSIVE  . POCT urinalysis dipstick    No Follow-up on file.

## 2014-04-20 DIAGNOSIS — R112 Nausea with vomiting, unspecified: Secondary | ICD-10-CM | POA: Insufficient documentation

## 2014-04-20 NOTE — Assessment & Plan Note (Signed)
May be secondary to UTI but cannot confirm without additional urine specimen and adding abx at this time not advised until n/v resolved. Phenergan suppositories prescribed .  If unable to stop  Vomiting will need ER evaluation

## 2014-04-22 ENCOUNTER — Encounter: Payer: Self-pay | Admitting: Internal Medicine

## 2014-04-23 LAB — CULTURE, URINE COMPREHENSIVE

## 2014-06-22 ENCOUNTER — Encounter: Payer: Self-pay | Admitting: Nurse Practitioner

## 2014-06-22 ENCOUNTER — Other Ambulatory Visit: Payer: Self-pay | Admitting: Internal Medicine

## 2014-06-22 ENCOUNTER — Ambulatory Visit (INDEPENDENT_AMBULATORY_CARE_PROVIDER_SITE_OTHER): Payer: 59 | Admitting: Nurse Practitioner

## 2014-06-22 VITALS — BP 110/78 | HR 103 | Temp 97.5°F | Resp 12 | Ht 66.0 in | Wt 180.8 lb

## 2014-06-22 DIAGNOSIS — B369 Superficial mycosis, unspecified: Secondary | ICD-10-CM | POA: Insufficient documentation

## 2014-06-22 DIAGNOSIS — G40909 Epilepsy, unspecified, not intractable, without status epilepticus: Secondary | ICD-10-CM

## 2014-06-22 DIAGNOSIS — B309 Viral conjunctivitis, unspecified: Secondary | ICD-10-CM

## 2014-06-22 MED ORDER — DIAZEPAM 5 MG PO TABS: ORAL_TABLET | ORAL | Status: DC

## 2014-06-22 MED ORDER — NYSTATIN 100000 UNIT/GM EX POWD: Freq: Two times a day (BID) | CUTANEOUS | Status: DC

## 2014-06-22 NOTE — Progress Notes (Signed)
Subjective:    Patient ID: Lindsey [name], female    DOB: [birth year], 29 y.o.   MRN: [id number]  HPI  Lindsey [name] is a 29 yo female with a CC of needing a refill on Diazepam, rash under axillae, and eye crusted this morning. Pt is accompanied by her Mother who is her caregiver and primary historian.   1) Needed refill for Diazepam- pt has CP and Seizure disorder.   2) Rash- Under axillae, tried antifungal cream 4-5 days, changed deodorant, and tried Neosporin none are helpful.   3) Eye- Right eye had some wet mucus this morning in the inner corner of her eye and looks more red on that corner as well.   Review of Systems  Constitutional: Negative for fever, chills, diaphoresis and fatigue.  Eyes: Positive for discharge and redness. Negative for pain, itching and visual disturbance.  Respiratory: Negative for chest tightness, shortness of breath and wheezing.   Cardiovascular: Negative for chest pain, palpitations and leg swelling.  Gastrointestinal: Negative for nausea, vomiting and diarrhea.  Skin: Positive for rash.  Neurological: Negative for dizziness, weakness, numbness and headaches.  Psychiatric/Behavioral: The patient is not nervous/anxious.    Past Medical History  Diagnosis Date  . Temporal lobectomy behavior syndrome 1994    Right  . Status post VNS (vagus nerve stimulator) placement 2000  . Epilepsy age 67    negative metabolic workup has brain abnormalities by MRI (Dr. Quintin Alto, [facility])  . Arthritis   . Insomnia     History   Social History  . Marital Status: Single    Spouse Name: N/A  . Number of Children: N/A  . Years of Education: N/A   Occupational History  . Not on file.   Social History Main Topics  . Smoking status: Never Smoker   . Smokeless tobacco: Never Used  . Alcohol Use: No  . Drug Use: No  . Sexual Activity: Not on file   Other Topics Concern  . Not on file   Social History Narrative    Past Surgical History  Procedure Laterality  Date  . Tonsillectomy and adenoidectomy  age 60  . Abdominal hysterectomy  2005  . Brain surgery      Temporal Lobe resection  . Craniotomy for temporal lobectomy      No family history on file.  Allergies  Allergen Reactions  . Influenza Virus Vaccine Split Other (See Comments)    48 hours of fever, myalgias   . Benadryl [Diphenhydramine Hcl]     Seizure  . Zyrtec [Cetirizine Hcl]     Seizure     Current Outpatient Prescriptions on File Prior to Visit  Medication Sig Dispense Refill  . diazepam (DIASTAT ACUDIAL) 10 MG GEL Place 20 mg rectally once.    . gabapentin (NEURONTIN) 100 MG capsule Take 100 mg by mouth at bedtime.    . lamoTRIgine (LAMICTAL) 100 MG tablet Take 3 tablets by mouth two times a day.     . SEROQUEL XR 50 MG TB24 24 hr tablet TAKE 1 TABLET BY MOUTH DAILY 90 each 2   No current facility-administered medications on file prior to visit.      Objective:   Physical Exam  Constitutional: She is oriented to person, place, and time. She appears well-developed and well-nourished. No distress.  BP 110/78 mmHg  Pulse 103  Temp(Src) 97.5 F (36.4 C) (Oral)  Resp 12  Ht  (1.676 m)  Wt 180 lb 12.8 oz (82.01 kg)  BMI 29.20 kg/m2  SpO2 97%  LMP 01/09/2011 Repeat was 98- Pulse historically on the higher side  HENT:  Head: Normocephalic and atraumatic.  Right Ear: External ear normal.  Left Ear: External ear normal.  Mouth/Throat: No oropharyngeal exudate.  Eyes: EOM are normal. Pupils are equal, round, and reactive to light. Right eye exhibits no discharge. Left eye exhibits no discharge. No scleral icterus.  Right eye slightly redened eye on the medial side, no discharge seen, cornea- pt did not allow me to look for more than two seconds- slightly red.   Cardiovascular: Normal rate, regular rhythm, normal heart sounds and intact distal pulses.  Exam reveals no gallop and no friction rub.   No murmur heard. Pulmonary/Chest: Effort normal and breath sounds  normal. No respiratory distress. She has no wheezes. She has no rales. She exhibits no tenderness.  Neurological: She is alert and oriented to person, place, and time. No cranial nerve deficit. She exhibits normal muscle tone. Coordination normal.  Skin: Skin is warm and dry. Rash noted. She is not diaphoretic.     Psychiatric: She has a normal mood and affect. Her behavior is normal. Judgment and thought content normal.      Assessment & Plan:

## 2014-06-22 NOTE — Telephone Encounter (Signed)
Pt has appoint with you today.  Please advise refill

## 2014-06-22 NOTE — Assessment & Plan Note (Signed)
Gave mother patient's script for Diazepam 5 mg tablet 1 + 1/2 at night time. 5 refills, same as previous.

## 2014-06-22 NOTE — Patient Instructions (Signed)
Visine-A is a good over the counter cheap eye drop.  Call us if eye or rash worsens, changes, or fails to improve.  Viral Conjunctivitis Conjunctivitis is an irritation (inflammation) of the clear membrane that covers the white part of the eye (the conjunctiva). The irritation can also happen on the underside of the eyelids. Conjunctivitis makes the eye red or pink in color. This is what is commonly known as pink eye. Viral conjunctivitis can spread easily (contagious). CAUSES   Infection from virus on the surface of the eye.  Infection from the irritation or injury of nearby tissues such as the eyelids or cornea.  More serious inflammation or infection on the inside of the eye.  Other eye diseases.  The use of certain eye medications. SYMPTOMS  The normally white color of the eye or the underside of the eyelid is usually pink or red in color. The pink eye is usually associated with irritation, tearing and some sensitivity to light. Viral conjunctivitis is often associated with a clear, watery discharge. If a discharge is present, there may also be some blurred vision in the affected eye. DIAGNOSIS  Conjunctivitis is diagnosed by an eye exam. The eye specialist looks for changes in the surface tissues of the eye which take on changes characteristic of the specific types of conjunctivitis. A sample of any discharge may be collected on a Q-Tip (sterile swap). The sample will be sent to a lab to see whether or not the inflammation is caused by bacterial or viral infection. TREATMENT  Viral conjunctivitis will not respond to medicines that kill germs (antibiotics). Treatment is aimed at stopping a bacterial infection on top of the viral infection. The goal of treatment is to relieve symptoms (such as itching) with antihistamine drops or other eye medications.  HOME CARE INSTRUCTIONS   To ease discomfort, apply a cool, clean wash cloth to your eye for 10 to 20 minutes, 3 to 4 times a  day.  Gently wipe away any drainage from the eye with a warm, wet washcloth or a cotton ball.  Wash your hands often with soap and use paper towels to dry.  Do not share towels or washcloths. This may spread the infection.  Change or wash your pillowcase every day.  You should not use eye make-up until the infection is gone.  Stop using contacts lenses. Ask your eye professional how to sterilize or replace them before using again. This depends on the type of contact lenses used.  Do not touch the edge of the eyelid with the eye drop bottle or ointment tube when applying medications to the affected eye. This will stop you from spreading the infection to the other eye or to others. SEEK IMMEDIATE MEDICAL CARE IF:   The infection has not improved within 3 days of beginning treatment.  A watery discharge from the eye develops.  Pain in the eye increases.  The redness is spreading.  Vision becomes blurred.  An oral temperature above 102 F (38.9 C) develops, or as your caregiver suggests.  Facial pain, redness or swelling develops.  Any problems that may be related to the prescribed medicine develop. MAKE SURE YOU:   Understand these instructions.  Will watch your condition.  Will get help right away if you are not doing well or get worse. Document Released: 04/23/2005 Document Revised: 07/16/2011 Document Reviewed: 12/11/2007 [facility] Patient Information 2015 [place], [place]. This information is not intended to replace advice given to you by your health care provider. Make  sure you discuss any questions you have with your health care provider.  

## 2014-06-22 NOTE — Assessment & Plan Note (Signed)
Worsening. Started this morning, gave handout with information about viral conjunctivitis and OTC Visine-A for relief if symptoms occur. FU prn worsening/failure to improve.

## 2014-06-22 NOTE — Assessment & Plan Note (Signed)
Worsening over 2 weeks. Rx sent to pharmacy for nystatin powder. FU prn worsening/failure to improve.

## 2014-06-22 NOTE — Progress Notes (Signed)
Pre visit review using our clinic review tool, if applicable. No additional management support is needed unless otherwise documented below in the visit note. 

## 2014-07-02 ENCOUNTER — Telehealth: Payer: Self-pay | Admitting: Internal Medicine

## 2014-07-02 ENCOUNTER — Other Ambulatory Visit: Payer: Self-pay | Admitting: Internal Medicine

## 2014-07-02 ENCOUNTER — Encounter: Payer: Self-pay | Admitting: Internal Medicine

## 2014-07-02 MED ORDER — AMOXICILLIN 500 MG PO CAPS: 500.0000 mg | ORAL_CAPSULE | Freq: Two times a day (BID) | ORAL | Status: DC

## 2014-07-02 NOTE — Telephone Encounter (Signed)
Saw patient on 06/22/14 anything besides drops that can be used for the viral conjunctivitis

## 2014-07-02 NOTE — Telephone Encounter (Signed)
Patient mom stated that the viral conjunctivitis will not go away, is there something oral she can take for it, instead of drops. Please advise

## 2014-07-02 NOTE — Telephone Encounter (Signed)
Called patients mother and notified by phone.

## 2014-07-12 ENCOUNTER — Encounter: Payer: Self-pay | Admitting: Internal Medicine

## 2014-07-12 ENCOUNTER — Ambulatory Visit (INDEPENDENT_AMBULATORY_CARE_PROVIDER_SITE_OTHER): Payer: 59 | Admitting: Internal Medicine

## 2014-07-12 VITALS — BP 126/70 | HR 103 | Temp 97.5°F | Resp 14 | Ht 66.0 in | Wt 177.2 lb

## 2014-07-12 DIAGNOSIS — G40909 Epilepsy, unspecified, not intractable, without status epilepticus: Secondary | ICD-10-CM

## 2014-07-12 DIAGNOSIS — H6121 Impacted cerumen, right ear: Secondary | ICD-10-CM

## 2014-07-12 DIAGNOSIS — G2581 Restless legs syndrome: Secondary | ICD-10-CM | POA: Insufficient documentation

## 2014-07-12 DIAGNOSIS — R451 Restlessness and agitation: Secondary | ICD-10-CM

## 2014-07-12 DIAGNOSIS — L989 Disorder of the skin and subcutaneous tissue, unspecified: Secondary | ICD-10-CM | POA: Insufficient documentation

## 2014-07-12 DIAGNOSIS — L600 Ingrowing nail: Secondary | ICD-10-CM

## 2014-07-12 DIAGNOSIS — E663 Overweight: Secondary | ICD-10-CM

## 2014-07-12 DIAGNOSIS — H6120 Impacted cerumen, unspecified ear: Secondary | ICD-10-CM | POA: Insufficient documentation

## 2014-07-12 DIAGNOSIS — G809 Cerebral palsy, unspecified: Secondary | ICD-10-CM

## 2014-07-12 DIAGNOSIS — Z0000 Encounter for general adult medical examination without abnormal findings: Secondary | ICD-10-CM

## 2014-07-12 MED ORDER — GABAPENTIN 100 MG PO CAPS
100.0000 mg | ORAL_CAPSULE | Freq: Every day | ORAL | Status: DC
Start: 2014-07-12 — End: 2014-12-23

## 2014-07-12 NOTE — Progress Notes (Signed)
Pre-visit discussion using our clinic review tool. No additional management support is needed unless otherwise documented below in the visit note.  

## 2014-07-12 NOTE — Patient Instructions (Addendum)
We discussed increasing Lindsey [name]'s  Dose of  seroquel to 100 mg at bedtime first to help with the nighttime agitation   If this does not help, we can increase the diazepam  Her right ear canal is occluded by ear wax.  Try Debrox,  Or hydrogen peroxiide  Her skin lesion on the left check may be an enlarging basal cell carcinoma.  Please have dr Lindsey [name] look at it   Lindsey [name] has gained nearly 20 lbs sicne the Seroquel was started.  Please try to substitute Dannon Lt n fit yofurt with whipped cream instead of ice cream and encourage her to go for a walk daily

## 2014-07-12 NOTE — Progress Notes (Addendum)
Patient ID: Lindsey [name], female   DOB: [birth year], 29 y.o.   MRN: [id number]  The patient is here for annual wellness examination and management of other chronic and acute problems.   The risk factors are reflected in the social history.  The roster of all physicians providing medical care to patient - is listed in the Snapshot section of the chart.  Activities of daily living:  The patient is not 100% independent in all ADLs: she requires assistance with  dressing, toileting, feeding as well as independent mobility  Home safety : The patient has smoke detectors in the home. They wear seatbelts.  There are no firearms at home. There is no violence in the home.   There is no risks for hepatitis, STDs or HIV. There is no  history of blood transfusion. They have no travel history to infectious disease endemic areas of the world.  The patient has seen their dentist in the last six month. She has no  hearing difficulty with regard to whispered voices and some television programs.  She does not  have excessive sun exposure. Discussed the need for sun protection: hats, long sleeves and use of sunscreen if there is significant sun exposure.   Diet: the importance of a healthy diet is discussed. They do have a healthy diet.  The benefits of regular aerobic exercise were discussed. She walks 4 times per week ,  20 minutes.   Depression screen: there are no signs or vegative symptoms of depression- irritability, change in appetite, anhedonia, sadness/tearfullness.  Cognitive assessment: the patient does not manage any of her  financial and personal affairs and is actively engaged. She can not relate day,date,year and events; recall 2/3 objects at 3 minutes; or perform clock-face test normally.  The following portions of the patient's history were reviewed and updated as appropriate: allergies, current medications, past family history, past medical history,  past surgical history, past social history   and problem list.  Visual acuity was not assessed per patient preference since she has regular follow up with her ophthalmologist. Hearing and body mass index were assessed and reviewed.   During the course of the visit the patient was educated and counseled about appropriate screening and preventive services including : fall prevention , diabetes screening, nutrition counseling, colorectal cancer screening, and recommended immunizations.    Review of Systems:  Patient denies headache, fevers, malaise, unintentional weight loss, skin rash, eye pain, sinus congestion and sinus pain, sore throat, dysphagia,  hemoptysis , cough, dyspnea, wheezing, chest pain, palpitations, orthopnea, edema, abdominal pain, nausea, melena, diarrhea, constipation, flank pain, dysuria, hematuria, urinary  Frequency, nocturia, numbness, tingling, seizures,  Focal weakness, Loss of consciousness,  Tremor, insomnia, depression, anxiety, and suicidal ideation.    Objective:  BP 126/70 mmHg  Pulse 103  Temp(Src) 97.5 F (36.4 C) (Oral)  Resp 14  Ht  (1.676 m)  Wt 177 lb 4 oz (80.4 kg)  BMI 28.62 kg/m2  SpO2 98%  LMP 01/09/2011  General appearance: alert, childlike in appearance,  appears younger that stated age Ears: occluded right canal, TM not visible, normal  external ear canals  Throat: lips, mucosa, and tongue normal; teeth and gums normal Neck: no adenopathy, no carotid bruit, supple, symmetrical, trachea midline and thyroid not enlarged, symmetric, no tenderness/mass/nodules Back: symmetric, no curvature. ROM normal. No CVA tenderness. Lungs: clear to auscultation bilaterally Heart: regular rate and rhythm, S1, S2 normal, no murmur, click, rub or gallop Abdomen: soft, non-tender; bowel sounds normal;  no masses,  no organomegaly Pulses: 2+ and symmetric Skin: Skin color, texture, turgor normal. No rashes or lesions Lymph nodes: Cervical, supraclavicular, and axillary nodes normal.   Assessment  and Plan:  Problem List Items Addressed This Visit    RESOLVED: Skin lesion of cheek    She has an enlarging papular linaea lesion on the left cheek that needs dermatologic evaluation      Seizure disorder, primary    Managed with vagus nerve stimulator and  Diazepam 5 mg tablet 1 + 1/2 at night time. 5 refills, same as previous.         Routine general medical examination at a health care facility    Annual wellness  exam was done as well as a comprehensive physical exam and management of acute and chronic conditions .        Overweight    Complicated by cerebral palsy and use of seroquel for control on nocturnal agitation.  I have addressed  BMI and recommended wt loss of 10% of body weigh over the next 6 months using a low glycemic index diet and regular exercise a minimum of 5 days per week.        RESOLVED: Ingrown left big toenail           Cerumen impaction - Primary    Right ear is occluded.  She will not allow irrigation,  Mother advised  To try Debrox      Cerebral palsy    She remains dependent on her mother for care but is ambulatory and attends a daycare program that requires an annual permission note from me. She has a seizure disorder and has nighttime agitation that is managed with seroqel      Agitation    Discussed increasing her seroquel to 100 mg at bedtime for increased restlessness and agitation         A total of 40 minutes was spent with patient more than half of which was spent in counseling patient on the above mentioned issues , reviewing and explaining recent labs and imaging studies done, and coordination of care.

## 2014-07-13 MED ORDER — QUETIAPINE FUMARATE ER 50 MG PO TB24: 100.0000 mg | ORAL_TABLET | Freq: Every day | ORAL | Status: DC

## 2014-07-14 ENCOUNTER — Encounter: Payer: Self-pay | Admitting: Internal Medicine

## 2014-07-14 ENCOUNTER — Ambulatory Visit: Payer: Self-pay | Admitting: Internal Medicine

## 2014-07-14 DIAGNOSIS — E663 Overweight: Secondary | ICD-10-CM | POA: Insufficient documentation

## 2014-07-14 NOTE — Assessment & Plan Note (Signed)
Right ear is occluded.  She will not allow irrigation,  Mother advised  To try Debrox

## 2014-07-14 NOTE — Assessment & Plan Note (Signed)
Complicated by cerebral palsy and use of seroquel for control on nocturnal agitation.  I have addressed  BMI and recommended wt loss of 10% of body weigh over the next 6 months using a low glycemic index diet and regular exercise a minimum of 5 days per week.

## 2014-07-14 NOTE — Assessment & Plan Note (Addendum)
Managed with vagus nerve stimulator and  Diazepam 5 mg tablet 1 + 1/2 at night time. 5 refills, same as previous.

## 2014-07-14 NOTE — Assessment & Plan Note (Signed)
Annual wellness  exam was done as well as a comprehensive physical exam and management of acute and chronic conditions .

## 2014-07-14 NOTE — Assessment & Plan Note (Addendum)
She remains dependent on her mother for care but is ambulatory and attends a daycare program that requires an annual permission note from me. She has a seizure disorder and has nighttime agitation that is managed with seroqel

## 2014-07-14 NOTE — Assessment & Plan Note (Signed)
She has an enlarging papular linaea lesion on the left cheek that needs dermatologic evaluation

## 2014-07-14 NOTE — Assessment & Plan Note (Signed)
Discussed increasing her seroquel to 100 mg at bedtime for increased restlessness and agitation

## 2014-07-15 ENCOUNTER — Telehealth: Payer: Self-pay | Admitting: Internal Medicine

## 2014-07-15 NOTE — Telephone Encounter (Signed)
Patient mother notified that 100 mg of gabapentin is what was ordered by MD at this office patient [facility] mg g[facility]. Advised patient mother that with increase in Seroquel patient should remain at 100 mg gabapentin.

## 2014-07-15 NOTE — Telephone Encounter (Signed)
The gabapentin has been filled at 100 mg all along, andyou documented it during recent visit as well at 100 mg .  We increased the seroquel at recent visit, ask her to check her old prescriptions

## 2014-07-15 NOTE — Telephone Encounter (Signed)
Patient mother called and stated the gabapentin should be fore 300 mg and not 100 mg at bedtime please advise all I see in chart is 100 mg?

## 2014-08-16 ENCOUNTER — Ambulatory Visit (INDEPENDENT_AMBULATORY_CARE_PROVIDER_SITE_OTHER): Payer: 59 | Admitting: Internal Medicine

## 2014-08-16 ENCOUNTER — Encounter: Payer: Self-pay | Admitting: Internal Medicine

## 2014-08-16 VITALS — BP 128/80 | HR 102 | Temp 97.4°F | Resp 14 | Ht 66.0 in | Wt 181.8 lb

## 2014-08-16 DIAGNOSIS — H65112 Acute and subacute allergic otitis media (mucoid) (sanguinous) (serous), left ear: Secondary | ICD-10-CM | POA: Diagnosis not present

## 2014-08-16 MED ORDER — MONTELUKAST SODIUM 10 MG PO TABS: 10.0000 mg | ORAL_TABLET | Freq: Every day | ORAL | Status: DC

## 2014-08-16 NOTE — Progress Notes (Signed)
Patient ID: Lindsey [name], female   DOB: 1985-05-20, 29 y.o.   MRN: [id number]  Patient Active Problem List   Diagnosis Date Noted  . Otitis media 08/17/2014  . Overweight 07/14/2014  . Cerumen impaction 07/12/2014  . Agitation 07/12/2014  . Preoperative general physical examination 06/06/2013  . Restless legs 09/10/2012  . Routine general medical examination at a health care facility 06/08/2012  . Cerebral palsy 05/08/2012  . Acneiform dermatitis 06/30/2011  . Seizure disorder, primary 03/23/2011    Subjective:  CC:   Chief Complaint  Patient presents with  . Otalgia    left ear X 24 hours.    HPI:   Lindsey [name] is a 29 y.o. female who presents for   Possible otitis left ear, per mother,  Patient has bee tugging at her left ear for the past 36 hours.  No fevers,  Sneezing , coughing or headaches.    Past Medical History  Diagnosis Date  . Temporal lobectomy behavior syndrome 1994    Right  . Status post VNS (vagus nerve stimulator) placement 2000  . Epilepsy age 86    negative metabolic workup has brain abnormalities by MRI (Dr. Quintin Alto, [facility])  . Arthritis   . Insomnia     Past Surgical History  Procedure Laterality Date  . Tonsillectomy and adenoidectomy  age 66  . Abdominal hysterectomy  2005  . Brain surgery      Temporal Lobe resection  . Craniotomy for temporal lobectomy         The following portions of the patient's history were reviewed and updated as appropriate: Allergies, current medications, and problem list.    Review of Systems:   Patient denies headache, fevers, malaise, unintentional weight loss, skin rash, eye pain, sinus congestion and sinus pain, sore throat, dysphagia,  hemoptysis , cough, dyspnea, wheezing, chest pain, palpitations, orthopnea, edema, abdominal pain, nausea, melena, diarrhea, constipation, flank pain, dysuria, hematuria, urinary  Frequency, nocturia, numbness, tingling, seizures,  Focal weakness, Loss of  consciousness,  Tremor, insomnia, depression, anxiety, and suicidal ideation.     History   Social History  . Marital Status: Single    Spouse Name: N/A  . Number of Children: N/A  . Years of Education: N/A   Occupational History  . Not on file.   Social History Main Topics  . Smoking status: Never Smoker   . Smokeless tobacco: Never Used  . Alcohol Use: No  . Drug Use: No  . Sexual Activity: Not on file   Other Topics Concern  . Not on file   Social History Narrative    Objective:  Filed Vitals:   08/16/14 1541  BP: 128/80  Pulse: 102  Temp: 97.4 F (36.3 C)  Resp: 14     General appearance: alert, cooperative and appears stated age Ears: normal TM's and external ear canals both ears Throat: lips, mucosa, and tongue normal; teeth and gums normal Neck: no adenopathy, no carotid bruit, supple, symmetrical, trachea midline and thyroid not enlarged, symmetric, no tenderness/mass/nodules Back: symmetric, no curvature. ROM normal. No CVA tenderness. Lungs: clear to auscultation bilaterally Heart: regular rate and rhythm, S1, S2 normal, no murmur, click, rub or gallop Abdomen: soft, non-tender; bowel sounds normal; no masses,  no organomegaly Pulses: 2+ and symmetric Skin: Skin color, texture, turgor normal. No rashes or lesions Lymph nodes: Cervical, supraclavicular, and axillary nodes normal.  Assessment and Plan:  Otitis media She has no signs of bacterial infection on exam, but has been  having some symptoms of allergic rhinitis.  Trial of singulair.     Updated Medication List Outpatient Encounter Prescriptions as of 08/16/2014  Medication Sig  . diazepam (DIASTAT ACUDIAL) 10 MG GEL Place 20 mg rectally once.  . diazepam (VALIUM) 5 MG tablet 1 AND 1/2 TABLETS BY MOUTH AT BEDTIME ASNEEDED  . gabapentin (NEURONTIN) 100 MG capsule Take 1 capsule (100 mg total) by mouth at bedtime.  . lamoTRIgine (LAMICTAL) 100 MG tablet Take 3 tablets by mouth two times a  day.   . nystatin (MYCOSTATIN) powder Apply topically 2 (two) times daily.  . QUEtiapine (SEROQUEL XR) 50 MG TB24 24 hr tablet Take 2 tablets (100 mg total) by mouth daily.  . montelukast (SINGULAIR) 10 MG tablet Take 1 tablet (10 mg total) by mouth at bedtime.  . [DISCONTINUED] amoxicillin (AMOXIL) 500 MG capsule Take 1 capsule (500 mg total) by mouth 2 (two) times daily. (Patient not taking: Reported on 07/12/2014)     No orders of the defined types were placed in this encounter.    No Follow-up on file.

## 2014-08-16 NOTE — Progress Notes (Signed)
Pre-visit discussion using our clinic review tool. No additional management support is needed unless otherwise documented below in the visit note.  

## 2014-08-16 NOTE — Patient Instructions (Addendum)
I am starting Lindsey [name] on singulair for her allergies.  She has no signs of otitis on exam     Montelukast oral tablets What is this medicine? MONTELUKAST (mon te LOO kast) is used to prevent and treat the symptoms of asthma. It is also used to treat allergies. Do not use for an acute asthma attack. This medicine may be used for other purposes; ask your health care provider or pharmacist if you have questions. COMMON BRAND NAME(S): Singulair What should I tell my health care provider before I take this medicine? They need to know if you have any of these conditions: -liver disease -an unusual or allergic reaction to montelukast, other medicines, foods, dyes, or preservatives -pregnant or trying to get pregnant -breast-feeding How should I use this medicine? This medicine should be given by mouth. Follow the directions on the prescription label. Take this medicine at the same time every day. You may take this medicine with or without meals. Do not chew the tablets. Do not stop taking your medicine unless your doctor tells you to. Talk to your pediatrician regarding the use of this medicine in children. Special care may be needed. While this drug may be prescribed for children as young as 27 years of age for selected conditions, precautions do apply. Overdosage: If you think you have taken too much of this medicine contact a poison control center or emergency room at once. NOTE: This medicine is only for you. Do not share this medicine with others. What if I miss a dose? If you miss a dose, take it as soon as you can. If it is almost time for your next dose, take only that dose. Do not take double or extra doses. What may interact with this medicine? -anti-infectives like rifampin and rifabutin -medicines for diabetes like rosiglitazone and repaglinide -medicines for seizures like phenytoin, phenobarbital, and carbamazepine -paclitaxel This list may not describe all possible interactions.  Give your health care provider a list of all the medicines, herbs, non-prescription drugs, or dietary supplements you use. Also tell them if you smoke, drink alcohol, or use illegal drugs. Some items may interact with your medicine. What should I watch for while using this medicine? Visit your doctor or health care professional for regular checks on your progress. Tell your doctor or health care professional if your allergy or asthma symptoms do not improve. Take your medicine even when you do not have symptoms. Do not stop taking any of your medicine(s) unless your doctor tells you to. If you have asthma, talk to your doctor about what to do in an acute asthma attack. Always have your inhaled rescue medicine for asthma attacks with you. Patients and their families should watch for new or worsening thoughts of suicide or depression. Also watch for sudden changes in feelings such as feeling anxious, agitated, panicky, irritable, hostile, aggressive, impulsive, severely restless, overly excited and hyperactive, or not being able to sleep. Any worsening of mood or thoughts of suicide or dying should be reported to your health care professional right away. What side effects may I notice from receiving this medicine? Side effects that you should report to your doctor or health care professional as soon as possible: -allergic reactions like skin rash or hives, or swelling of the face, lips, or tongue -breathing problems -confusion -dark urine -fever or infection -flu-like symptoms -hallucinations -painful lumps under the skin -pain, tingling, numbness in the hands or feet -sinus pain or swelling -suicidal thoughts or other mood changes -trouble  sleeping -unusual bleeding or bruising -yellowing of the eyes or skin Side effects that usually do not require medical attention (report to your doctor or health care professional if they continue or are  bothersome): -cough -dizziness -drowsiness -headache -nightmares -stomach upset -stuffy nose This list may not describe all possible side effects. Call your doctor for medical advice about side effects. You may report side effects to FDA at 1-800-FDA-1088. Where should I keep my medicine? Keep out of the reach of children. Store at room temperature between 15 and 30 degrees C (59 and 86 degrees F). Protect from light and moisture. Keep this medicine in the original bottle. Throw away any unused medicine after the expiration date. NOTE: This sheet is a summary. It may not cover all possible information. If you have questions about this medicine, talk to your doctor, pharmacist, or health care provider.  2015, [employer]vier/[employer]. (2012-05-13 11:09:07)

## 2014-08-17 ENCOUNTER — Encounter: Payer: Self-pay | Admitting: Internal Medicine

## 2014-08-17 DIAGNOSIS — H6690 Otitis media, unspecified, unspecified ear: Secondary | ICD-10-CM | POA: Insufficient documentation

## 2014-08-17 NOTE — Assessment & Plan Note (Signed)
She has no signs of bacterial infection on exam, but has been having some symptoms of allergic rhinitis.  Trial of singulair.

## 2014-08-18 ENCOUNTER — Ambulatory Visit: Payer: Self-pay | Admitting: Internal Medicine

## 2014-08-19 ENCOUNTER — Other Ambulatory Visit: Payer: Self-pay | Admitting: Internal Medicine

## 2014-08-19 ENCOUNTER — Encounter: Payer: Self-pay | Admitting: Internal Medicine

## 2014-08-19 MED ORDER — AZITHROMYCIN 250 MG PO TABS: ORAL_TABLET | ORAL | Status: DC

## 2014-08-19 NOTE — Telephone Encounter (Signed)
pts mother called, left message on VM that pt was seen on Monday by Dr Darrick Huntsmanullo, however last night she began to have cough.  Further states pt is taking the Singulair.  Requesting additional medication.  Please advise

## 2014-10-05 ENCOUNTER — Encounter: Payer: Self-pay | Admitting: Internal Medicine

## 2014-10-05 ENCOUNTER — Ambulatory Visit (INDEPENDENT_AMBULATORY_CARE_PROVIDER_SITE_OTHER): Payer: 59 | Admitting: Internal Medicine

## 2014-10-05 VITALS — BP 118/70 | HR 116 | Temp 99.0°F | Resp 18 | Ht 66.0 in | Wt 187.5 lb

## 2014-10-05 DIAGNOSIS — G2581 Restless legs syndrome: Secondary | ICD-10-CM | POA: Diagnosis not present

## 2014-10-05 DIAGNOSIS — Z79899 Other long term (current) drug therapy: Secondary | ICD-10-CM

## 2014-10-05 DIAGNOSIS — E559 Vitamin D deficiency, unspecified: Secondary | ICD-10-CM | POA: Diagnosis not present

## 2014-10-05 DIAGNOSIS — J069 Acute upper respiratory infection, unspecified: Secondary | ICD-10-CM | POA: Insufficient documentation

## 2014-10-05 DIAGNOSIS — B9789 Other viral agents as the cause of diseases classified elsewhere: Principal | ICD-10-CM

## 2014-10-05 DIAGNOSIS — G40909 Epilepsy, unspecified, not intractable, without status epilepticus: Secondary | ICD-10-CM | POA: Diagnosis not present

## 2014-10-05 MED ORDER — NYSTATIN 100000 UNIT/GM EX OINT: 1.0000 "application " | TOPICAL_OINTMENT | Freq: Two times a day (BID) | CUTANEOUS | Status: DC

## 2014-10-05 MED ORDER — PREDNISONE 10 MG PO TABS: ORAL_TABLET | ORAL | Status: DC

## 2014-10-05 MED ORDER — AZITHROMYCIN 250 MG PO TABS: ORAL_TABLET | ORAL | Status: DC

## 2014-10-05 NOTE — Assessment & Plan Note (Addendum)
No signs of bacterial infection currently,  Reassurance provided,  Will give prednisone taper and continue use od Delsym.  Will add abx only for fever, purulent sputum

## 2014-10-05 NOTE — Progress Notes (Signed)
Pre-visit discussion using our clinic review tool. No additional management support is needed unless otherwise documented below in the visit note.  

## 2014-10-05 NOTE — Patient Instructions (Signed)
Lindsey [name] appears to  have a viral syndrome which is causing her cough.     The post nasal drip may alsoe be contributing to  her  Cough.  I am prescribing a prednisone taper to manage the inflammation in your bronchial tubes   I also advise use of the following OTC meds to help with your other symptoms.    Delsym for daytime cough. flush your sinuses twice daily with Simply Saline   If the coughing has not impoved in  48 hours  OR  if you develop T > 100.4,  Green nasal discharge,  Or facial pain, start the antibiotic.  Please take a probiotic ( Align, Floraque or Culturelle) while you are on the antibiotic to prevent a serious antibiotic associated diarrhea  Called clostridium dificile colitis and a vaginal yeast infection

## 2014-10-05 NOTE — Progress Notes (Signed)
Subjective:  Patient ID: Lindsey [name], female    DOB: 23-Sep-1985  Age: 29 y.o. MRN: [id number]  CC: The primary encounter diagnosis was Viral URI with cough. Diagnoses of Seizure disorder, primary, Restless legs, Vitamin D deficiency, and Long-term use of high-risk medication were also pertinent to this visit.  HPI [name] presents for cough, increased seizure activity. Patient is brought in by primary caregiver,  Patient's mother who reports increased cough for the last several days.  Se had 2 seziures yesterday,  And one last week.  He has not been febrile,  And allergy symptoms are well controlled on Singulair.  Mother is concerned that the increase in seizure activity heralds an infection.    Outpatient Prescriptions Prior to Visit  Medication Sig Dispense Refill  . diazepam (DIASTAT ACUDIAL) 10 MG GEL Place 20 mg rectally once.    . diazepam (VALIUM) 5 MG tablet 1 AND 1/2 TABLETS BY MOUTH AT BEDTIME ASNEEDED 45 tablet 5  . gabapentin (NEURONTIN) 100 MG capsule Take 1 capsule (100 mg total) by mouth at bedtime. 90 capsule 2  . lamoTRIgine (LAMICTAL) 100 MG tablet Take 3 tablets by mouth two times a day.     . montelukast (SINGULAIR) 10 MG tablet Take 1 tablet (10 mg total) by mouth at bedtime. 30 tablet 3  . QUEtiapine (SEROQUEL XR) 50 MG TB24 24 hr tablet Take 2 tablets (100 mg total) by mouth daily. 180 each 2  . nystatin (MYCOSTATIN) powder Apply topically 2 (two) times daily. 15 g 0  . azithromycin (ZITHROMAX) 250 MG tablet 2 tablets on day 1, then one tablet daily until gone (Patient not taking: Reported on 10/05/2014) 6 tablet 0   No facility-administered medications prior to visit.    Review of Systems;  Per mother, patient has not had  headache, fevers, malaise, unintentional weight loss, skin rash, eye pain, sinus congestion and sinus pain, sore throat, dysphagia,  hemoptysis , , dyspnea, wheezing, chest pain, palpitations, orthopnea, edema, abdominal pain,  nausea, melena, diarrhea, constipation, flank pain, dysuria, hematuria, urinary  Frequency, nocturia, numbness, tingling,   Focal weakness, Loss of consciousness,  Tremor, insomnia, depression, anxiety, and suicidal ideation.      Objective:  BP 118/70 mmHg  Pulse 116  Temp(Src) 99 F (37.2 C) (Oral)  Resp 18  Ht  (1.676 m)  Wt 187 lb 8 oz (85.049 kg)  BMI 30.28 kg/m2  SpO2 98%  LMP 01/09/2011  BP Readings from Last 3 Encounters:  10/05/14 118/70  08/16/14 128/80  07/12/14 126/70    Wt Readings from Last 3 Encounters:  10/05/14 187 lb 8 oz (85.049 kg)  08/16/14 181 lb 12 oz (82.441 kg)  07/12/14 177 lb 4 oz (80.4 kg)    General appearance: alert, cooperative and appears stated age Ears: normal TM's and external ear canals both ears Throat: lips, mucosa, and tongue normal; teeth and gums normal Neck: no adenopathy, no carotid bruit, supple, symmetrical, trachea midline and thyroid not enlarged, symmetric, no tenderness/mass/nodules Back: symmetric, no curvature. ROM normal. No CVA tenderness. Lungs: clear to auscultation bilaterally Heart: regular rate and rhythm, S1, S2 normal, no murmur, click, rub or gallop Abdomen: soft, non-tender; bowel sounds normal; no masses,  no organomegaly Pulses: 2+ and symmetric Skin: Skin color, texture, turgor dry.  No diaphoresis.  No rashes or lesions Lymph nodes: Cervical, supraclavicular, and axillary nodes normal.  No results found for: HGBA1C  Lab Results  Component Value Date   CREATININE 0.6  09/10/2012    Lab Results  Component Value Date   GLUCOSE 97 09/10/2012   NA 138 09/10/2012   K 4.6 09/10/2012   CL 103 09/10/2012   CREATININE 0.6 09/10/2012   BUN 8 09/10/2012   CO2 26 09/10/2012    No results found.  Assessment & Plan:   Problem List Items Addressed This Visit    Seizure disorder, primary    Her increased seizure activity was discussed,  Mother has been unwilling to increase medication to include  Keppra due to increased sedation experienced by her aunt.  Has a follow up with neurologist Dr Jenean Lindauhadtke next month,  Checking lamictal level today.       Viral URI with cough - Primary    No signs of bacterial infection currently,  Reassurance provided,  Will give prednisone taper and continue use od Delsym.  Will add abx only for fever, purulent sputum      Relevant Medications   azithromycin (ZITHROMAX) 250 MG tablet   nystatin ointment (MYCOSTATIN)   Other Relevant Orders   CBC with Differential/Platelet   Restless legs   Relevant Orders   Comprehensive metabolic panel   TSH   IBC panel    Other Visit Diagnoses    Vitamin D deficiency        Relevant Orders    Vit D  25 hydroxy (rtn osteoporosis monitoring)    Long-term use of high-risk medication        Relevant Orders    Lamotrigine level       I have discontinued Ms. Karpf's nystatin and azithromycin. I am also having her start on predniSONE, azithromycin, and nystatin ointment. Additionally, I am having her maintain her lamoTRIgine, diazepam, diazepam, gabapentin, QUEtiapine, and montelukast.  Meds ordered this encounter  Medications  . predniSONE (DELTASONE) 10 MG tablet    Sig: 6 tablets on Day 1 , then reduce by 1 tablet daily until gone    Dispense:  21 tablet    Refill:  0  . azithromycin (ZITHROMAX) 250 MG tablet    Sig: 2 tablets on Day 1,  Then 1 tablet daily for 4 days    Dispense:  6 tablet    Refill:  0  . nystatin ointment (MYCOSTATIN)    Sig: Apply 1 application topically 2 (two) times daily.    Dispense:  30 g    Refill:  0    Medications Discontinued During This Encounter  Medication Reason  . nystatin (MYCOSTATIN) powder   . azithromycin (ZITHROMAX) 250 MG tablet     Follow-up: No Follow-up on file.   Sherlene ShamsULLO, TERESA L, MD

## 2014-10-05 NOTE — Assessment & Plan Note (Signed)
Her increased seizure activity was discussed,  Mother has been unwilling to increase medication to include Keppra due to increased sedation experienced by her aunt.  Has a follow up with neurologist Dr Jenean Lindauhadtke next month,  Checking lamictal level today.

## 2014-10-06 LAB — COMPREHENSIVE METABOLIC PANEL
ALK PHOS: 77 U/L (ref 39–117)
ALT: 51 U/L — ABNORMAL HIGH (ref 0–35)
AST: 42 U/L — ABNORMAL HIGH (ref 0–37)
Albumin: 4.3 g/dL (ref 3.5–5.2)
BUN: 8 mg/dL (ref 6–23)
CALCIUM: 9.7 mg/dL (ref 8.4–10.5)
CO2: 28 meq/L (ref 19–32)
CREATININE: 0.63 mg/dL (ref 0.40–1.20)
Chloride: 103 mEq/L (ref 96–112)
GFR: 120.31 mL/min (ref >60.00)
Glucose, Bld: 80 mg/dL (ref 70–99)
Potassium: 4.3 mEq/L (ref 3.5–5.1)
Sodium: 138 mEq/L (ref 135–145)
TOTAL PROTEIN: 7.1 g/dL (ref 6.0–8.3)
Total Bilirubin: 0.4 mg/dL (ref 0.2–1.2)

## 2014-10-06 LAB — TSH: TSH: 2.48 u[IU]/mL (ref 0.35–4.50)

## 2014-10-06 LAB — CBC WITH DIFFERENTIAL/PLATELET
BASOS ABS: 0 10*3/uL (ref 0.0–0.1)
Basophils Relative: 0.2 % (ref 0.0–3.0)
EOS PCT: 0 % (ref 0.0–5.0)
Eosinophils Absolute: 0 10*3/uL (ref 0.0–0.7)
HCT: 41.8 % (ref 36.0–46.0)
Hemoglobin: 14.2 g/dL (ref 12.0–15.0)
LYMPHS ABS: 1.8 10*3/uL (ref 0.7–4.0)
LYMPHS PCT: 26.3 % (ref 12.0–46.0)
MCHC: 33.9 g/dL (ref 30.0–36.0)
MCV: 86.6 fl (ref 78.0–100.0)
Monocytes Absolute: 0.4 10*3/uL (ref 0.1–1.0)
Monocytes Relative: 5.7 % (ref 3.0–12.0)
NEUTROS ABS: 4.8 10*3/uL (ref 1.4–7.7)
Neutrophils Relative %: 67.8 % (ref 43.0–77.0)
Platelets: 264 10*3/uL (ref 150.0–400.0)
RBC: 4.83 Mil/uL (ref 3.87–5.11)
RDW: 13 % (ref 11.5–15.5)
WBC: 7 10*3/uL (ref 4.0–10.5)

## 2014-10-06 LAB — IBC PANEL
IRON: 102 ug/dL (ref 42–145)
Saturation Ratios: 31.4 % (ref 20.0–50.0)
Transferrin: 232 mg/dL (ref 212.0–360.0)

## 2014-10-06 LAB — VITAMIN D 25 HYDROXY (VIT D DEFICIENCY, FRACTURES): VITD: 24.73 ng/mL — ABNORMAL LOW (ref 30.00–100.00)

## 2014-10-08 ENCOUNTER — Encounter: Payer: Self-pay | Admitting: Internal Medicine

## 2014-10-08 ENCOUNTER — Other Ambulatory Visit: Payer: Self-pay | Admitting: Internal Medicine

## 2014-10-08 DIAGNOSIS — R748 Abnormal levels of other serum enzymes: Secondary | ICD-10-CM

## 2014-10-08 LAB — LAMOTRIGINE LEVEL: Lamotrigine Lvl: 13.3 ug/mL (ref 4.0–18.0)

## 2014-10-19 ENCOUNTER — Ambulatory Visit: Payer: 59 | Admitting: Nurse Practitioner

## 2014-11-04 ENCOUNTER — Telehealth: Payer: Self-pay | Admitting: Internal Medicine

## 2014-11-04 ENCOUNTER — Other Ambulatory Visit (INDEPENDENT_AMBULATORY_CARE_PROVIDER_SITE_OTHER): Payer: 59

## 2014-11-04 DIAGNOSIS — R748 Abnormal levels of other serum enzymes: Secondary | ICD-10-CM | POA: Diagnosis not present

## 2014-11-04 LAB — HEPATIC FUNCTION PANEL
ALT: 34 U/L (ref 0–35)
AST: 26 U/L (ref 0–37)
Albumin: 4.3 g/dL (ref 3.5–5.2)
Alkaline Phosphatase: 78 U/L (ref 39–117)
Bilirubin, Direct: 0.1 mg/dL (ref 0.0–0.3)
Total Bilirubin: 0.4 mg/dL (ref 0.2–1.2)
Total Protein: 7.2 g/dL (ref 6.0–8.3)

## 2014-11-04 NOTE — Telephone Encounter (Signed)
In red folder for today. 

## 2014-11-04 NOTE — Telephone Encounter (Signed)
Pt mother-[name] dropped off paper work to be filled out. Paper work in Dr. Melina Schoolsullo's box/msn

## 2014-11-05 ENCOUNTER — Telehealth: Payer: Self-pay | Admitting: Internal Medicine

## 2014-11-05 ENCOUNTER — Encounter: Payer: Self-pay | Admitting: Internal Medicine

## 2014-11-05 LAB — HEPATITIS B SURFACE ANTIGEN: HEP B S AG: NEGATIVE

## 2014-11-05 LAB — HEPATITIS B CORE ANTIBODY, TOTAL: Hep B Core Total Ab: NONREACTIVE

## 2014-11-05 LAB — ANA: Anti Nuclear Antibody(ANA): NEGATIVE

## 2014-11-05 LAB — HEPATITIS C ANTIBODY: HCV AB: NEGATIVE

## 2014-11-05 LAB — ANTI-SMITH ANTIBODY: ENA SM AB SER-ACNC: NEGATIVE

## 2014-11-05 NOTE — Telephone Encounter (Signed)
Form completed , can pick up or distribute today.  n/c

## 2014-11-05 NOTE — Telephone Encounter (Signed)
In todays

## 2014-11-05 NOTE — Telephone Encounter (Signed)
I did not receive it in red folder

## 2014-11-10 ENCOUNTER — Encounter: Payer: Self-pay | Admitting: Physical Therapy

## 2014-11-10 ENCOUNTER — Ambulatory Visit: Payer: 59 | Attending: Ophthalmology | Admitting: Physical Therapy

## 2014-11-10 DIAGNOSIS — M436 Torticollis: Secondary | ICD-10-CM | POA: Insufficient documentation

## 2014-11-10 NOTE — Therapy (Signed)
[facility] MAIN [facility] SERVICES [address] [place], [place], [address] Phone: [phone]   Fax:  [phone]  Physical Therapy Evaluation  Patient Details  Name: Lindsey [name] MRN: [id number] Date of Birth: 1985/07/02 Referring Provider:  Delene Loll, MD  Encounter Date: 11/10/2014      PT End of Session - 11/10/14 0905    Visit Number 1   Number of Visits 1   Date for PT Re-Evaluation 11/10/14   PT Start Time 0805   PT Stop Time 0850   PT Time Calculation (min) 45 min   Activity Tolerance Patient tolerated treatment well;No increased pain      Past Medical History  Diagnosis Date  . Temporal lobectomy behavior syndrome 1994    Right  . Status post VNS (vagus nerve stimulator) placement 2000  . Epilepsy age 38    negative metabolic workup has brain abnormalities by MRI (Dr. Quintin Alto, [facility])  . Insomnia     Past Surgical History  Procedure Laterality Date  . Tonsillectomy and adenoidectomy  age 60  . Abdominal hysterectomy  2005  . Brain surgery      Temporal Lobe resection  . Craniotomy for temporal lobectomy      There were no vitals filed for this visit.  Visit Diagnosis:  Torticollis - Plan: PT plan of care cert/re-cert      Subjective Assessment - 11/10/14 0810    Subjective Patient presents to PT for non-occular torticollis. At baseline, patient has decreased mental funciton with difficulties answering history questions; mother present for entire PT eval to assist as needed. Pt's mother reports that patient had an eye surgery on 1996 to correct a lazy eye. Eye doctor referred pt to screen for possible non-occular toricolis. Pt's mother states that head positioning has been better over the last couple of weeks. Pt has a history of siezures, 2 within the past month.    Patient is accompained by: Family member   Pertinent History Vagal nerve stimulator, temporal lobectomy, occular surgery to correct lazy eye.     How  long can you sit comfortably? able to sit and stand as long as possible without pain.    How long can you walk comfortably? no problems with walking, head held in flexion with gait   Diagnostic tests none for neck problems,   Patient Stated Goals improve posture    Currently in Pain? No/denies            [facility] PT Assessment - 11/10/14 0001    Assessment   Medical Diagnosis Torticollis    Onset Date/Surgical Date 11/01/14   Hand Dominance Right   Next MD Visit 01/06/2015   Prior Therapy no    Precautions   Precautions Fall   Restrictions   Weight Bearing Restrictions No   Balance Screen   Has the patient fallen in the past 6 months No   Has the patient had a decrease in activity level because of a fear of falling?  Yes   Is the patient reluctant to leave their home because of a fear of falling?  No   Home Nurse, mental health Private residence   Living Arrangements Parent   Available Help at Discharge Family   Type of Home House   Home Access Stairs to enter   Entrance Stairs-Number of Steps 3   Entrance Stairs-Rails Right   Home Layout Two level   Alternate Level Stairs-Number of Steps 14   Alternate Level  Stairs-Rails Right;Left   Home Equipment Grab bars - tub/shower   Additional Comments Pt lives on fist level    Prior Function   Level of Independence Needs assistance with ADLs   Vocation On disability   Leisure cut papers    Cognition   Overall Cognitive Status History of cognitive impairments - at baseline   Behaviors Restless   Sensation   Light Touch Appears Intact   Additional Comments --   Posture/Postural Control   Posture Comments Pt sits with forward head and R lateral flexion and slight L rotation, but is able to correct upon moderate verbal cueing and to maintain gaze at mother.    ROM / Strength   AROM / PROM / Strength AROM;Strength   AROM   Overall AROM Comments Patient was hesitant to perform active ROM due to increased anxiety, but  was within functional limits for UE. Cervical ROM was equal bilaterally with no increase in pain.    Strength   Overall Strength Unable to assess  Assessed through functional observation.    Overall Strength Comments standard strength testing was not performed; functional strength at least 4-/5 throughout UE    Palpation   Palpation comment No tenderness or tightness noted in upper/middle traps, no tenderness or tinghtness noted in suboccipitals/cervical paraspinals.    Ambulation/Gait   Gait Comments Pt ambulates with flexed posture, and flexed knees, decreased step length noted bilaterally.    Standardized Balance Assessment   10 Meter Walk 0.7146m/s. <1.8230m/s Indicates slight fall risk.                            PT Education - 11/10/14 [phone]    Education provided Yes   Education Details Plan of care; no continued PT at this time. If pain in neck increases or function declines, seek PT to correct.    Person(s) Educated Patient;Parent(s)   Methods Explanation;Demonstration   Comprehension Verbalized understanding;Returned demonstration             PT Long Term Goals - 11/10/14 1137    PT LONG TERM GOAL #1   Title Patient and mother will be aware of proper positioning to prevent torticollis.    Time 1   Period Days   Status Achieved               Plan - 11/10/14 0905    Clinical Impression Statement Pt is a pleasant 29 year old female with a history of Temporal lobectomy and repeated siezures, moderate mental disability at baseline that requires assistance with most ADLs. Pt reports to PT due to referral from eye doctor for possible torticollis. At rest, patient presents with slight R lateral flexion and L rotation; Patient able to correct without pain with verbal cueing and to maintain gaze on mother. PT asessed cervical neck ROM; [facility] and non-painful  throughout. No tenderness or tightness noted in cervical paraspinals, sub-occipitals or  mid/upper traps. Functional strength found to be at least 4-/5 in UE. At this time PT is not recommended due to the fact that pt does not express pain with cervical motion, ROM with grossly WFL, and pt can correct head position with verbal instruction.    Rehab Potential Excellent   Clinical Impairments Affecting Rehab Potential Positive: good family support. Negative: cognitive deficits.    PT Frequency One time visit   PT Home Exercise Plan Patient and mother to be aware of proper positioning to  reduce torticollis.    Consulted and Agree with Plan of Care Patient;Family member/caregiver   Family Member Consulted Mother         Problem List Patient Active Problem List   Diagnosis Date Noted  . Viral URI with cough 10/05/2014  . Overweight 07/14/2014  . Agitation 07/12/2014  . Preoperative general physical examination 06/06/2013  . Restless legs 09/10/2012  . Routine general medical examination at a health care facility 06/08/2012  . Cerebral palsy 05/08/2012  . Acneiform dermatitis 06/30/2011  . Seizure disorder, primary 03/23/2011   Grier Rocher SPT 11/10/2014   2:48 PM  This entire session was performed under direct supervision and direction of a licensed therapist . I have personally read, edited and approve of the note as written.   Hopkins,Margaret, PT, DPT 11/10/2014, 2:48 PM  [place] [facility] MAIN [facility] SERVICES [address] [place], [place], [address] Phone: [phone]   Fax:  [phone]

## 2014-12-07 ENCOUNTER — Encounter: Payer: Self-pay | Admitting: Nurse Practitioner

## 2014-12-07 ENCOUNTER — Ambulatory Visit (INDEPENDENT_AMBULATORY_CARE_PROVIDER_SITE_OTHER): Payer: 59 | Admitting: Nurse Practitioner

## 2014-12-07 VITALS — BP 110/80 | HR 107 | Temp 97.6°F | Resp 14 | Ht 66.0 in | Wt 190.8 lb

## 2014-12-07 DIAGNOSIS — R21 Rash and other nonspecific skin eruption: Secondary | ICD-10-CM | POA: Diagnosis not present

## 2014-12-07 NOTE — Progress Notes (Signed)
   Subjective:    Patient ID: Lindsey [name], female    DOB: 1986-03-21, 29 y.o.   MRN: [id number]  HPI  Lindsey [name] is a 29 yo female with a CC of rash x 3 days and she is accompanied by her family member today.   1) Bilateral legs, Sunday was the worst day, probably from the heat her caregiver reports. It was described as red and blotchy. It is fully resolved today. She still has small scattered mosquito bites over bilateral legs.   Cortisone and Nystatin cream- helpful  Review of Systems  Constitutional: Negative for fever, chills, diaphoresis and fatigue.  Respiratory: Negative for chest tightness, shortness of breath and wheezing.   Cardiovascular: Negative for leg swelling.  Skin: Positive for rash.       Resolved today  Neurological: Negative for light-headedness.  Psychiatric/Behavioral: The patient is not nervous/anxious.       Objective:   Physical Exam  Constitutional: She appears well-developed and well-nourished. No distress.  BP 110/80 mmHg  Pulse 107  Temp(Src) 97.6 F (36.4 C)  Resp 14  Ht  (1.676 m)  Wt 190 lb 12.8 oz (86.546 kg)  BMI 30.81 kg/m2  SpO2 97%  LMP 01/09/2011   Neurological: She is alert.  Skin: Skin is warm and dry. No rash noted. She is not diaphoretic.  Rash has resolved, dry skin on legs is still visible and small scattered mosquito bites on bilateral lower legs .  Psychiatric: She has a normal mood and affect. Her behavior is normal. Judgment and thought content normal.      Assessment & Plan:  Rash-   1) Resolved currently 2) Discussed using cool clothing that wicks moisture 3) Continue use of cortisone cream as needed 4) FU prn worsening/failure to improve.

## 2014-12-07 NOTE — Progress Notes (Signed)
Pre visit review using our clinic review tool, if applicable. No additional management support is needed unless otherwise documented below in the visit note. 

## 2014-12-08 ENCOUNTER — Other Ambulatory Visit: Payer: Self-pay

## 2014-12-08 ENCOUNTER — Telehealth: Payer: Self-pay | Admitting: Internal Medicine

## 2014-12-08 ENCOUNTER — Encounter: Payer: Self-pay | Admitting: Internal Medicine

## 2014-12-08 MED ORDER — DIAZEPAM 5 MG PO TABS: ORAL_TABLET | ORAL | Status: DC

## 2014-12-08 NOTE — Telephone Encounter (Signed)
If patient's mother calls,  I will see Lindsey [name] tomorrow for rash on legs

## 2014-12-09 ENCOUNTER — Encounter: Payer: Self-pay | Admitting: Internal Medicine

## 2014-12-09 ENCOUNTER — Ambulatory Visit (INDEPENDENT_AMBULATORY_CARE_PROVIDER_SITE_OTHER): Payer: 59 | Admitting: Internal Medicine

## 2014-12-09 VITALS — BP 118/78 | HR 94 | Temp 97.5°F | Resp 16 | Ht 66.0 in | Wt 190.5 lb

## 2014-12-09 DIAGNOSIS — L853 Xerosis cutis: Secondary | ICD-10-CM

## 2014-12-09 NOTE — Progress Notes (Signed)
Subjective:  Patient ID: Lindsey [name], female    DOB: 08/29/1985  Age: 29 y.o. MRN: [id number]  CC: There were no encounter diagnoses.  HPI Lindsey [name] presents for recurrent rash of lower extremities.  Patient was brought in by mother several days ago with report of pruritic rash but rash had resolved by  time of evaluation .  Patient's mother states that the rash has returned and requested urgent treatment Since they are going out of town.  There is no history of fever, insect bites or  Trauma.   Outpatient Prescriptions Prior to Visit  Medication Sig Dispense Refill  . diazepam (DIASTAT ACUDIAL) 10 MG GEL Place 20 mg rectally once.    . diazepam (VALIUM) 5 MG tablet 1 AND 1/2 TABLETS BY MOUTH AT BEDTIME ASNEEDED 45 tablet 5  . gabapentin (NEURONTIN) 100 MG capsule Take 1 capsule (100 mg total) by mouth at bedtime. 90 capsule 2  . lamoTRIgine (LAMICTAL) 100 MG tablet Take 3 tablets by mouth two times a day.     . nystatin ointment (MYCOSTATIN) Apply 1 application topically 2 (two) times daily. 30 g 0  . QUEtiapine (SEROQUEL XR) 50 MG TB24 24 hr tablet Take 2 tablets (100 mg total) by mouth daily. 180 each 2  . azithromycin (ZITHROMAX) 250 MG tablet 2 tablets on Day 1,  Then 1 tablet daily for 4 days (Patient not taking: Reported on 12/07/2014) 6 tablet 0  . montelukast (SINGULAIR) 10 MG tablet Take 1 tablet (10 mg total) by mouth at bedtime. (Patient not taking: Reported on 12/07/2014) 30 tablet 3  . predniSONE (DELTASONE) 10 MG tablet 6 tablets on Day 1 , then reduce by 1 tablet daily until gone (Patient not taking: Reported on 12/07/2014) 21 tablet 0   No facility-administered medications prior to visit.    Review of Systems;  Patient denies headache, fevers, malaise, unintentional weight loss, skin rash, eye pain, sinus congestion and sinus pain, sore throat, dysphagia,  hemoptysis , cough, dyspnea, wheezing, chest pain, palpitations, orthopnea, edema, abdominal pain, nausea,  melena, diarrhea, constipation, flank pain, dysuria, hematuria, urinary  Frequency, nocturia, numbness, tingling, seizures,  Focal weakness, Loss of consciousness,  Tremor, insomnia, depression, anxiety, and suicidal ideation.      Objective:  BP 118/78 mmHg  Pulse 94  Temp(Src) 97.5 F (36.4 C) (Axillary)  Resp 16  Ht 5\' 6"  (1.676 m)  Wt 190 lb 8 oz (86.41 kg)  BMI 30.76 kg/m2  SpO2 98%  LMP 01/09/2011  BP Readings from Last 3 Encounters:  12/09/14 118/78  12/07/14 110/80  10/05/14 118/70    Wt Readings from Last 3 Encounters:  12/09/14 190 lb 8 oz (86.41 kg)  12/07/14 190 lb 12.8 oz (86.546 kg)  10/05/14 187 lb 8 oz (85.049 kg)    General appearance: alert, cooperative and appears stated age Back: symmetric, no curvature. ROM normal. No CVA tenderness. Lungs: clear to auscultation bilaterally Heart: regular rate and rhythm, S1, S2 normal, no murmur, click, rub or gallop Abdomen: soft, non-tender; bowel sounds normal; no masses,  no organomegaly Pulses: 2+ and symmetric Skin: extremely dry skin with flaking and excoriations present  Lymph nodes: Cervical, supraclavicular, and axillary nodes normal.  No results found for: HGBA1C  Lab Results  Component Value Date   CREATININE 0.63 10/05/2014   CREATININE 0.6 09/10/2012    Lab Results  Component Value Date   WBC 7.0 10/05/2014   HGB 14.2 10/05/2014   HCT 41.8 10/05/2014  PLT 264.0 10/05/2014   GLUCOSE 80 10/05/2014   ALT 34 11/04/2014   AST 26 11/04/2014   NA 138 10/05/2014   K 4.3 10/05/2014   CL 103 10/05/2014   CREATININE 0.63 10/05/2014   BUN 8 10/05/2014   CO2 28 10/05/2014   TSH 2.48 10/05/2014    No results found.  Assessment & Plan:   Problem List Items Addressed This Visit    None      I have discontinued Ms. Collyer's montelukast, predniSONE, and azithromycin. I am also having her maintain her lamoTRIgine, diazepam, gabapentin, QUEtiapine, nystatin ointment, and diazepam.  No orders  of the defined types were placed in this encounter.    Medications Discontinued During This Encounter  Medication Reason  . azithromycin (ZITHROMAX) 250 MG tablet Completed Course  . montelukast (SINGULAIR) 10 MG tablet Completed Course  . predniSONE (DELTASONE) 10 MG tablet Completed Course    Follow-up: No Follow-up on file.   Sherlene Shams, MD

## 2014-12-09 NOTE — Progress Notes (Signed)
Pre-visit discussion using our clinic review tool. No additional management support is needed unless otherwise documented below in the visit note.  

## 2014-12-09 NOTE — Telephone Encounter (Signed)
Patient scheduled.

## 2014-12-09 NOTE — Patient Instructions (Signed)
You have dry skin!  It can cause itching and redness  I recommend that you use Eucerin skin cream twice daily

## 2014-12-12 DIAGNOSIS — L853 Xerosis cutis: Secondary | ICD-10-CM | POA: Insufficient documentation

## 2014-12-12 NOTE — Assessment & Plan Note (Signed)
Reassurance provided that there is no rash, only extreme xerosis.  Advised to use Eucerin twice daily

## 2014-12-23 ENCOUNTER — Other Ambulatory Visit: Payer: Self-pay | Admitting: Internal Medicine

## 2015-02-08 ENCOUNTER — Ambulatory Visit (INDEPENDENT_AMBULATORY_CARE_PROVIDER_SITE_OTHER): Payer: 59 | Admitting: Family Medicine

## 2015-02-08 ENCOUNTER — Encounter: Payer: Self-pay | Admitting: Family Medicine

## 2015-02-08 VITALS — BP 110/72 | HR 116 | Temp 96.8°F | Ht 66.0 in | Wt 188.4 lb

## 2015-02-08 DIAGNOSIS — B349 Viral infection, unspecified: Secondary | ICD-10-CM

## 2015-02-08 MED ORDER — ONDANSETRON HCL 4 MG PO TABS: 4.0000 mg | ORAL_TABLET | Freq: Three times a day (TID) | ORAL | Status: DC | PRN

## 2015-02-08 NOTE — Progress Notes (Signed)
   Subjective:  Patient ID: Lindsey [name], female    DOB: July 31, 1985  Age: 29 y.o. MRN: [id number]  CC: Nausea, vomiting, Hoarseness  HPI:  29 year old female with a past medical history of cerebral palsy, seizure disorder presents for acute visit with the above complaints  History obtained via mother.  Mother reports that she has not been feeling well since Sunday. She reports that she had an episode of nausea and vomiting on Sunday. She states that this slowly improved but her appetite has not picked up. Yesterday she developed hoarseness. Mother does report associated tactile fever (once). No reported sick contacts. No exacerbating or relieving factors. No other symptoms at this time.  Social Hx   Social History   Social History  . Marital Status: Single    Spouse Name: N/A  . Number of Children: N/A  . Years of Education: N/A   Social History Main Topics  . Smoking status: Never Smoker   . Smokeless tobacco: Never Used  . Alcohol Use: No  . Drug Use: No  . Sexual Activity: Not Asked   Other Topics Concern  . None   Social History Narrative   Review of Systems  Constitutional: Positive for fever.  Gastrointestinal: Positive for nausea and vomiting.   Objective:  BP 110/72 mmHg  Pulse 116  Temp(Src) 96.8 F (36 C) (Axillary)  Ht  (1.676 m)  Wt 188 lb 6 oz (85.446 kg)  BMI 30.42 kg/m2  SpO2 96%  LMP 01/09/2011  BP/Weight 02/08/2015 12/09/2014 12/07/2014  Systolic BP 110 118 110  Diastolic BP 72 78 80  Wt. (Lbs) 188.38 190.5 190.8  BMI 30.42 30.76 30.81   Physical Exam  Constitutional: She appears well-developed and well-nourished. No distress.  HENT:  Head: Normocephalic and atraumatic.  Normal chains bilaterally. Oropharynx clear.  Cardiovascular: Tachycardia present.   No murmur heard. Pulmonary/Chest: Effort normal and breath sounds normal. No respiratory distress. She has no wheezes. She has no rales.  Abdominal: Soft. She exhibits no  distension. There is no tenderness. There is no rebound and no guarding.  Neurological: She is alert.  Vitals reviewed.  Assessment & Plan:   Problem List Items Addressed This Visit    Viral illness - Primary    Nausea and vomiting has resolved. She now has some hoarseness. Normal exam today. Advised mother that this is viral in origin and will slowly resolve. Advised supportive care.         Meds ordered this encounter  Medications  . ondansetron (ZOFRAN) 4 MG tablet    Sig: Take 1 tablet (4 mg total) by mouth every 8 (eight) hours as needed for nausea or vomiting.    Dispense:  20 tablet    Refill:  0    Follow-up: PRN  Everlene Other, DO

## 2015-02-08 NOTE — Patient Instructions (Signed)
This is viral in origin.  Continue supportive care with fluids and rest.   You can use the zofran if she has vomiting again.  Call if she worsens or fails to improve.  Take care  Dr. Adriana Simas

## 2015-02-08 NOTE — Progress Notes (Signed)
Pre visit review using our clinic review tool, if applicable. No additional management support is needed unless otherwise documented below in the visit note. 

## 2015-02-08 NOTE — Assessment & Plan Note (Signed)
Nausea and vomiting has resolved. She now has some hoarseness. Normal exam today. Advised mother that this is viral in origin and will slowly resolve. Advised supportive care.

## 2015-02-09 ENCOUNTER — Telehealth: Payer: Self-pay | Admitting: Internal Medicine

## 2015-02-09 NOTE — Telephone Encounter (Signed)
Pt mom called about pt is very weak this morning and cannot stand up with help. Pt had diarrhea last night no vomiting since Sunday. Pt mom would like a call on what to do? No appt avail to sch. Mom does not want to see any other provider. Thank You!

## 2015-02-09 NOTE — Telephone Encounter (Signed)
Spoke with patient's mom on the phone, states that she is feeling alittle better , is pushing fluids and got her to eat a sandwich for lunch.  Will call back with concerns if needed.

## 2015-02-09 NOTE — Telephone Encounter (Signed)
Dr. Adriana Simas saw this patient yesterday, diagnosis ed viral in nature and prescribed Zofran.  Please advise?

## 2015-02-09 NOTE — Telephone Encounter (Signed)
Encourage increased intake of fluids , it sounds like she is dehydrated.  If she will not,  She'll have to call EMS or take her to ER for IV fluids.

## 2015-03-03 ENCOUNTER — Ambulatory Visit: Payer: Self-pay | Admitting: Internal Medicine

## 2015-03-03 ENCOUNTER — Ambulatory Visit (INDEPENDENT_AMBULATORY_CARE_PROVIDER_SITE_OTHER): Payer: 59 | Admitting: Internal Medicine

## 2015-03-03 ENCOUNTER — Encounter: Payer: Self-pay | Admitting: Internal Medicine

## 2015-03-03 VITALS — BP 120/78 | HR 104 | Temp 97.6°F | Resp 12 | Ht 66.0 in | Wt 189.0 lb

## 2015-03-03 DIAGNOSIS — G2581 Restless legs syndrome: Secondary | ICD-10-CM

## 2015-03-03 DIAGNOSIS — R451 Restlessness and agitation: Secondary | ICD-10-CM

## 2015-03-03 MED ORDER — PRAMIPEXOLE DIHYDROCHLORIDE 0.25 MG PO TABS: 0.2500 mg | ORAL_TABLET | Freq: Three times a day (TID) | ORAL | Status: DC

## 2015-03-03 MED ORDER — QUETIAPINE FUMARATE 200 MG PO TABS: 200.0000 mg | ORAL_TABLET | Freq: Every day | ORAL | Status: DC

## 2015-03-03 NOTE — Patient Instructions (Addendum)
  We will stop the neurontin and start mirapex at 0.25 mg 1/2 hour before bedtime . (NOT 3 TIMES DAILY . JUST AT NIGHT)  This dose can be increased by doubling it every 3-4 days  We also discussed  Changing seroquel to immediate release,  But ADD THE  mirapex AND STOP THE NEURONTIN AS THE FIRST CHANGE   CONTINUE the seroquel XR for now.

## 2015-03-03 NOTE — Assessment & Plan Note (Signed)
Stopping neurontin and starting mirapex at 0.25 mg and increase every 3-4 days .  changing seroquel to IR  Same dose,

## 2015-03-03 NOTE — Progress Notes (Signed)
Subjective:  Patient ID: Lindsey [name], female    DOB: 1985/09/30  Age: 29 y.o. MRN: [id number]  CC: The primary encounter diagnosis was Restlessness and agitation. A diagnosis of Restless legs syndrome was also pertinent to this visit.  HPI Lindsey [name] presents for INCREASED NIGHTTIME agitation.  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  Takes seroquel XR 200 MG and diazepam 5 mg at 8;30 am   Bedtime is 10:30 but she is often not sleepy at that time and is restless untl 11:30 .  Occasionally she sleeps all the way through the night but mother reports that she is averaging 3 nights of constantn restless per week,   Says that her legs hurt. Wants to go sit in a chair but mother insists she stay in bed.  Sleepy during the day     Outpatient Prescriptions Prior to Visit  Medication Sig Dispense Refill  . diazepam (DIASTAT ACUDIAL) 10 MG GEL Place 20 mg rectally once.    . diazepam (VALIUM) 5 MG tablet 1 AND 1/2 TABLETS BY MOUTH AT BEDTIME ASNEEDED 45 tablet 5  . lamoTRIgine (LAMICTAL) 100 MG tablet Take 3 tablets by mouth two times a day.     . gabapentin (NEURONTIN) 100 MG capsule TAKE 1 CAPSULE AT BEDTIME 90 capsule 1  . QUEtiapine (SEROQUEL XR) 50 MG TB24 24 hr tablet Take 2 tablets (100 mg total) by mouth daily. 180 each 2  . nystatin ointment (MYCOSTATIN) Apply 1 application topically 2 (two) times daily. (Patient not taking: Reported on 03/03/2015) 30 g 0  . ondansetron (ZOFRAN) 4 MG tablet Take 1 tablet (4 mg total) by mouth every 8 (eight) hours as needed for nausea or vomiting. (Patient not taking: Reported on 03/03/2015) 20 tablet 0   No facility-administered medications prior to visit.    Review of Systems;  Patient denies headache, fevers, malaise, unintentional weight loss, skin rash, eye pain, sinus congestion and sinus pain, sore throat, dysphagia,  hemoptysis , cough, dyspnea, wheezing, chest pain,  palpitations, orthopnea, edema, abdominal pain, nausea, melena, diarrhea, constipation, flank pain, dysuria, hematuria, urinary  Frequency, nocturia, numbness, tingling, seizures,  Focal weakness, Loss of consciousness,  Tremor, insomnia, depression, anxiety, and suicidal ideation.      Objective:  BP 120/78 mmHg  Pulse 104  Temp(Src) 97.6 F (36.4 C) (Axillary)  Resp 12  Ht  (1.676 m)  Wt 189 lb (85.73 kg)  BMI 30.52 kg/m2  SpO2 98%  LMP 01/09/2011  BP Readings from Last 3 Encounters:  03/03/15 120/78  02/08/15 110/72  12/09/14 118/78    Wt Readings from Last 3 Encounters:  03/03/15 189 lb (85.73 kg)  02/08/15 188 lb 6 oz (85.446 kg)  12/09/14 190 lb 8 oz (86.41 kg)    General appearance: alert, cooperative and appears stated age Ears: normal TM's and external ear canals both ears Throat: lips, mucosa, and tongue normal; teeth and gums normal Neck: no adenopathy, no carotid bruit, supple, symmetrical, trachea midline and thyroid not enlarged, symmetric, no tenderness/mass/nodules Back: symmetric, no curvature. ROM normal. No CVA tenderness. Lungs: clear to auscultation bilaterally Heart: regular rate and rhythm, S1, S2 normal, no murmur, click, rub or gallop Abdomen: soft, non-tender; bowel sounds normal; no masses,  no organomegaly Pulses: 2+ and symmetric Skin: Skin color, texture, turgor normal. No rashes or lesions Lymph nodes: Cervical, supraclavicular, and axillary nodes normal.  No results found  for: HGBA1C  Lab Results  Component Value Date   CREATININE 0.63 10/05/2014   CREATININE 0.6 09/10/2012    Lab Results  Component Value Date   WBC 7.0 10/05/2014   HGB 14.2 10/05/2014   HCT 41.8 10/05/2014   PLT 264.0 10/05/2014   GLUCOSE 80 10/05/2014   ALT 34 11/04/2014   AST 26 11/04/2014   NA 138 10/05/2014   K 4.3 10/05/2014   CL 103 10/05/2014   CREATININE 0.63 10/05/2014   BUN 8 10/05/2014   CO2 28 10/05/2014   TSH 2.48 10/05/2014    No  results found.  Assessment & Plan:   Problem List Items Addressed This Visit    Restlessness and agitation - Primary    Stopping neurontin and starting mirapex at 0.25 mg and increase every 3-4 days .  changing seroquel to IR  Same dose,       Restless legs syndrome    Starting mirapex at 0.25 mg at bedtime.  Dose can be increased every 3 to 4 days as needed  Also changing seroquel from XR to IR          I have discontinued Lindsey [name]'s QUEtiapine and gabapentin. I am also having her start on QUEtiapine. Additionally, I am having her maintain her lamoTRIgine, diazepam, nystatin ointment, diazepam, ondansetron, and pramipexole.  Meds ordered this encounter  Medications  . QUEtiapine (SEROQUEL) 200 MG tablet    Sig: Take 1 tablet (200 mg total) by mouth at bedtime.    Dispense:  90 tablet    Refill:  1    NOTE CHANGE FROM XR TO IMMEDIATE RELEASE  . DISCONTD: pramipexole (MIRAPEX) 0.25 MG tablet    Sig: Take 1 tablet (0.25 mg total) by mouth 3 (three) times daily.    Dispense:  90 tablet    Refill:  0  . pramipexole (MIRAPEX) 0.25 MG tablet    Sig: Take 1 tablet (0.25 mg total) by mouth 3 (three) times daily.    Dispense:  90 tablet    Refill:  0    Medications Discontinued During This Encounter  Medication Reason  . QUEtiapine (SEROQUEL XR) 50 MG TB24 24 hr tablet   . gabapentin (NEURONTIN) 100 MG capsule   . pramipexole (MIRAPEX) 0.25 MG tablet Reorder   A total of 25 minutes of face to face time was spent with patient more than half of which was spent in counselling about the above mentioned conditions  and coordination of care  Follow-up: Return in about 3 months (around 06/03/2015).   Sherlene ShamsULLO, Dorell Gatlin L, MD

## 2015-03-03 NOTE — Assessment & Plan Note (Signed)
Starting mirapex at 0.25 mg at bedtime.  Dose can be increased every 3 to 4 days as needed  Also changing seroquel from XR to IR

## 2015-03-03 NOTE — Progress Notes (Signed)
Pre-visit discussion using our clinic review tool. No additional management support is needed unless otherwise documented below in the visit note.  

## 2015-03-04 ENCOUNTER — Telehealth: Payer: Self-pay | Admitting: *Deleted

## 2015-03-04 NOTE — Telephone Encounter (Signed)
Form copied to be scanned and form mailed to patient.

## 2015-03-04 NOTE — Telephone Encounter (Signed)
Patient dropped ff a form for Dr Darrick Huntsmanullo to sign. Form is in mailbox. Form has Attached stamped envelope to mail back to mother.

## 2015-03-04 NOTE — Telephone Encounter (Signed)
Placed in Dr Tullo's folder 

## 2015-03-04 NOTE — Telephone Encounter (Signed)
Complted and returned to you,  No charge

## 2015-03-09 ENCOUNTER — Encounter: Payer: Self-pay | Admitting: Internal Medicine

## 2015-03-09 ENCOUNTER — Telehealth: Payer: Self-pay | Admitting: *Deleted

## 2015-03-09 NOTE — Telephone Encounter (Signed)
Patient stated that the medication Mirapex Rx label has to match the order. The Pharmacy need a new Rx to reprint the label.

## 2015-03-10 ENCOUNTER — Other Ambulatory Visit: Payer: Self-pay | Admitting: *Deleted

## 2015-03-10 MED ORDER — PRAMIPEXOLE DIHYDROCHLORIDE 0.25 MG PO TABS: ORAL_TABLET | ORAL | Status: DC

## 2015-03-10 NOTE — Telephone Encounter (Signed)
I called the pharmacy to clarify what needed to be changed. Pharmacy stated that Rx has been printed and picked up. Pharmacist said that she was unsure about what was going on because labels can be reprinted there, and that the label matched what was prescribed. I called to speak with patient to try to get more information as well, but patient was not home. I spoke with patient's grandmother, and she said she would have the patient call back when she get's home.

## 2015-03-10 NOTE — Telephone Encounter (Signed)
I spoke with Chrisitine @ [facility] & she asked that we write the directions as once a day to initially working up to TID. Pt's mother called back & states that she needs it changed again to: one tablet at bedtime with dose to be increased gradually.  Direction updated & mother aware.

## 2015-03-28 ENCOUNTER — Other Ambulatory Visit: Payer: Self-pay | Admitting: Internal Medicine

## 2015-04-13 ENCOUNTER — Ambulatory Visit (INDEPENDENT_AMBULATORY_CARE_PROVIDER_SITE_OTHER): Payer: 59 | Admitting: Nurse Practitioner

## 2015-04-13 VITALS — BP 108/72 | HR 96 | Temp 98.2°F | Resp 12 | Ht 66.0 in | Wt 180.2 lb

## 2015-04-13 DIAGNOSIS — R300 Dysuria: Secondary | ICD-10-CM

## 2015-04-13 LAB — URINALYSIS, ROUTINE W REFLEX MICROSCOPIC
Bilirubin Urine: NEGATIVE
Ketones, ur: NEGATIVE
LEUKOCYTES UA: NEGATIVE
Nitrite: NEGATIVE
PH: 6 (ref 5.0–8.0)
Specific Gravity, Urine: 1.025 (ref 1.000–1.030)
UROBILINOGEN UA: 0.2 (ref 0.0–1.0)
Urine Glucose: NEGATIVE

## 2015-04-13 LAB — POCT URINALYSIS DIPSTICK
Glucose, UA: NEGATIVE
KETONES UA: NEGATIVE
Leukocytes, UA: NEGATIVE
Nitrite, UA: NEGATIVE
PH UA: 6
SPEC GRAV UA: 1.025
Urobilinogen, UA: 0.2

## 2015-04-13 MED ORDER — CEPHALEXIN 500 MG PO CAPS: 500.0000 mg | ORAL_CAPSULE | Freq: Two times a day (BID) | ORAL | Status: DC

## 2015-04-13 NOTE — Progress Notes (Signed)
Patient ID: Lindsey [name], female    DOB: Jan 20, 1986  Age: 29 y.o. MRN: [id number]  CC: Urinary Tract Infection   HPI Lindsey [name] presents for UTI symptoms.   1) Monday and Tuesday night with frequency her mother reports. She denies odor, pain, hematuria, or flank concerns. Denies fever, chills. Denies treatment to date. Pt does not have frequency of UTIs    History Lindsey [name] has a past medical history of Temporal lobectomy behavior syndrome (1994); Status post VNS (vagus nerve stimulator) placement (2000); Epilepsy [facility](HCC) (age 31); and Insomnia.   She has past surgical history that includes Tonsillectomy and adenoidectomy (age 89); Abdominal hysterectomy (2005); Brain surgery; and Craniotomy for temporal lobectomy.   Her family history is not on file.She reports that she has never smoked. She has never used smokeless tobacco. She reports that she does not drink alcohol or use illicit drugs.  Outpatient Prescriptions Prior to Visit  Medication Sig Dispense Refill  . diazepam (DIASTAT ACUDIAL) 10 MG GEL Place 20 mg rectally once.    . diazepam (VALIUM) 5 MG tablet 1 AND 1/2 TABLETS BY MOUTH AT BEDTIME ASNEEDED 45 tablet 5  . lamoTRIgine (LAMICTAL) 100 MG tablet Take 3 tablets by mouth two times a day.     . pramipexole (MIRAPEX) 0.25 MG tablet TAKE ONE TABLET BY MOUTH AT BEDTIME WITH DOSE TO BE INCREASED GRADUALLY 90 tablet 2  . QUEtiapine (SEROQUEL) 200 MG tablet Take 1 tablet (200 mg total) by mouth at bedtime. 90 tablet 1  . nystatin ointment (MYCOSTATIN) Apply 1 application topically 2 (two) times daily. (Patient not taking: Reported on 04/13/2015) 30 g 0  . ondansetron (ZOFRAN) 4 MG tablet Take 1 tablet (4 mg total) by mouth every 8 (eight) hours as needed for nausea or vomiting. (Patient not taking: Reported on 04/13/2015) 20 tablet 0   No facility-administered medications prior to visit.    ROS Review of Systems  Constitutional: Negative for fever, chills, diaphoresis and  fatigue.  Genitourinary: Positive for urgency and frequency. Negative for dysuria, hematuria, flank pain, decreased urine volume, difficulty urinating and pelvic pain.  Skin: Negative for rash.    Objective:  BP 108/72 mmHg  Pulse 96  Temp(Src) 98.2 F (36.8 C)  Resp 12  Ht 5\' 6"  (1.676 m)  Wt 180 lb 3.2 oz (81.738 kg)  BMI 29.10 kg/m2  SpO2 98%  LMP 01/09/2011  Physical Exam  Constitutional: She is oriented to person, place, and time. She appears well-developed and well-nourished. No distress.  HENT:  Head: Normocephalic and atraumatic.  Right Ear: External ear normal.  Left Ear: External ear normal.  Eyes: Right eye exhibits no discharge. Left eye exhibits no discharge. No scleral icterus.  Cardiovascular: Normal rate and regular rhythm.   Pulmonary/Chest: Effort normal and breath sounds normal.  Abdominal: There is no CVA tenderness.  Neurological: She is alert and oriented to person, place, and time.  Skin: Skin is warm and dry. No rash noted. She is not diaphoretic.   Assessment & Plan:   Lindsey [name] was seen today for urinary tract infection.  Diagnoses and all orders for this visit:  Dysuria -     POCT Urinalysis Dipstick -     Urine Culture -     Urinalysis, Routine w reflex microscopic  Other orders -     cephALEXin (KEFLEX) 500 MG capsule; Take 1 capsule (500 mg total) by mouth 2 (two) times daily.   I have discontinued [name]'s nystatin ointment and ondansetron.  I am also having her start on cephALEXin. Additionally, I am having her maintain her lamoTRIgine, diazepam, diazepam, QUEtiapine, and pramipexole.  Meds ordered this encounter  Medications  . cephALEXin (KEFLEX) 500 MG capsule    Sig: Take 1 capsule (500 mg total) by mouth 2 (two) times daily.    Dispense:  10 capsule    Refill:  0    Order Specific Question:  Supervising Provider    Answer:  Sherlene Shams [[id number]]     Follow-up: Return if symptoms worsen or fail to improve.

## 2015-04-13 NOTE — Progress Notes (Signed)
Pre visit review using our clinic review tool, if applicable. No additional management support is needed unless otherwise documented below in the visit note. 

## 2015-04-13 NOTE — Patient Instructions (Signed)
Please take a probiotic ( Align, Floraque or Culturelle) while you are on the antibiotic to prevent a serious antibiotic associated diarrhea  Called clostirudium dificile colitis and a vaginal yeast infection.  

## 2015-04-15 LAB — URINE CULTURE
COLONY COUNT: NO GROWTH
ORGANISM ID, BACTERIA: NO GROWTH

## 2015-04-22 ENCOUNTER — Encounter: Payer: Self-pay | Admitting: Nurse Practitioner

## 2015-04-22 DIAGNOSIS — R300 Dysuria: Secondary | ICD-10-CM | POA: Insufficient documentation

## 2015-04-22 NOTE — Assessment & Plan Note (Signed)
Mother was given instructions due to pt cognitive delay. Keflex give for UTI symptoms and POCT urine. Will obtain culture. Encouraged yogurt/probiotics whichever is easier. FU prn worsening/failure to improve.

## 2015-05-07 ENCOUNTER — Encounter: Payer: Self-pay | Admitting: Internal Medicine

## 2015-05-17 ENCOUNTER — Other Ambulatory Visit: Payer: Self-pay | Admitting: Internal Medicine

## 2015-05-17 NOTE — Telephone Encounter (Signed)
Ok to refill,  Refill printed 

## 2015-05-17 NOTE — Telephone Encounter (Signed)
Pt last OV 03/03/15, Pt last filled 12/08/14 #45 tabs with 5 refills. Please Advise/tvw

## 2015-05-23 ENCOUNTER — Ambulatory Visit (INDEPENDENT_AMBULATORY_CARE_PROVIDER_SITE_OTHER): Payer: 59 | Admitting: Internal Medicine

## 2015-05-23 ENCOUNTER — Encounter: Payer: Self-pay | Admitting: Internal Medicine

## 2015-05-23 VITALS — BP 116/72 | HR 95 | Temp 97.5°F | Resp 12 | Ht 66.0 in | Wt 171.5 lb

## 2015-05-23 DIAGNOSIS — G40909 Epilepsy, unspecified, not intractable, without status epilepticus: Secondary | ICD-10-CM

## 2015-05-23 NOTE — Progress Notes (Signed)
Pre-visit discussion using our clinic review tool. No additional management support is needed unless otherwise documented below in the visit note.  

## 2015-05-23 NOTE — Progress Notes (Signed)
Subjective:  Patient ID: Lindsey [name], female    DOB: [birth year]  Age: 30 y.o. MRN: [id number]  CC: The primary encounter diagnosis was Seizure disorder (HCC). A diagnosis of Seizure disorder, primary [facility]) was also pertinent to this visit.  HPI Lindsey [name] presents for follow up on seizure disorder.  Patient is accompanied by mother Lindsey [name] who is guardian as patient cannot provide history due to history of significant developmental delay, macrocephaly and hippocampal and mesiotemporal sclerosis by serial MRIs.  .    Patient has a long history of intractable complex partial seizure disorder which started at age 1.5 yrs with nocturnal seizures that lasted 20 minutes and evolved into general tonic clonic seizures. She failed drug therapy and underwent temporal lobectomy at age 63 at [facility] (despite advice to the contrary by pediatric neurologist Lindsey [name]) and remained seizure free for 9 months) .Seizures returned after several infections, followed by improvement in frequency with addition of Lamictal .  In2000, she had a vagal nerve stimulator placed , and replacement of VNS in 2011 by Lindsey [name] at [facility], followdby removal and replcememtn of neuro stimulator pulse generator for VNS May 24 2010 by Lindsey [name].   Patient 's mother  states that currently she is averaging one seizure per month on scheduled Lamictal.  Mirapex was added in November or RLS.   She is administering diazepam daily at bedtime 7.5 mg,  But in the visit today insists that she is only using it at onset of seizure activity, to prevent ER visits.  (medication refill record suggests nightly use) and there is a history of nighttime agitation that has been addressed with use of seroquel .  She is frustrated by Lindsey [name] continual recommendations to add additional medications and feels that the last office visit report contained inaccurate information .    She wants what is best for her daughter,  But  does not feel that one seizure per month necessitates additional medication and does not want to reduce the use of diazepam  To "only use for back to back seiuizures" as advised by Lindsey [name].   Outpatient Prescriptions Prior to Visit  Medication Sig Dispense Refill  . diazepam (DIASTAT ACUDIAL) 10 MG GEL Place 20 mg rectally once.    . diazepam (VALIUM) 5 MG tablet TAKE 1 AND 1/2 TABLET AT BEDTIME AS NEEDED 45 tablet 5  . lamoTRIgine (LAMICTAL) 100 MG tablet Take 3 tablets by mouth two times a day.     . pramipexole (MIRAPEX) 0.25 MG tablet TAKE ONE TABLET BY MOUTH AT BEDTIME WITH DOSE TO BE INCREASED GRADUALLY 90 tablet 2  . QUEtiapine (SEROQUEL) 200 MG tablet Take 1 tablet (200 mg total) by mouth at bedtime. 90 tablet 1  . cephALEXin (KEFLEX) 500 MG capsule Take 1 capsule (500 mg total) by mouth 2 (two) times daily. (Patient not taking: Reported on 05/23/2015) 10 capsule 0   No facility-administered medications prior to visit.    Review of Systems;  Patient denies headache, fevers, malaise, unintentional weight loss, skin rash, eye pain, sinus congestion and sinus pain, sore throat, dysphagia,  hemoptysis , cough, dyspnea, wheezing, chest pain, palpitations, orthopnea, edema, abdominal pain, nausea, melena, diarrhea, constipation, flank pain, dysuria, hematuria, urinary  Frequency, nocturia, numbness, tingling, seizures,  Focal weakness, Loss of consciousness,  Tremor, insomnia, depression, anxiety, and suicidal ideation.      Objective:  BP 116/72 mmHg  Pulse 95  Temp(Src) 97.5 F (36.4 C) (Oral)  Resp 12  Ht 5\' 6"  (1.676 m)  Wt 171 lb 8 oz (77.792 kg)  BMI 27.69 kg/m2  SpO2 98%  LMP 01/09/2011  BP Readings from Last 3 Encounters:  05/23/15 116/72  04/13/15 108/72  03/03/15 120/78    Wt Readings from Last 3 Encounters:  05/23/15 171 lb 8 oz (77.792 kg)  04/13/15 180 lb 3.2 oz (81.738 kg)  03/03/15 189 lb (85.73 kg)    General appearance: alert, intermittently cooperative  and appears younger than stated age Ears: normal TM's and external ear canals both ears Throat: lips, mucosa, and tongue normal; teeth and gums normal Neck: no adenopathy, no carotid bruit, supple, symmetrical, trachea midline and thyroid not enlarged, symmetric, no tenderness/mass/nodules Back: symmetric, no curvature. ROM normal. No CVA tenderness. Lungs: clear to auscultation bilaterally Heart: regular rate and rhythm, S1, S2 normal, no murmur, click, rub or gallop Abdomen: soft, non-tender; bowel sounds normal; no masses,  no organomegaly Pulses: 2+ and symmetric Skin: Skin color, texture, turgor normal. No rashes or lesions Lymph nodes: Cervical, supraclavicular, and axillary nodes normal. Neuro: follows only very simple commands.  Speech is limited to 2 word utterances.  Normal strength in all extremities   No results found for: HGBA1C  Lab Results  Component Value Date   CREATININE 0.63 10/05/2014   CREATININE 0.6 09/10/2012    Lab Results  Component Value Date   WBC 7.0 10/05/2014   HGB 14.2 10/05/2014   HCT 41.8 10/05/2014   PLT 264.0 10/05/2014   GLUCOSE 80 10/05/2014   ALT 34 11/04/2014   AST 26 11/04/2014   NA 138 10/05/2014   K 4.3 10/05/2014   CL 103 10/05/2014   CREATININE 0.63 10/05/2014   BUN 8 10/05/2014   CO2 28 10/05/2014   TSH 2.48 10/05/2014    No results found.  Assessment & Plan:   Problem List Items Addressed This Visit    Seizure disorder, primary (HCC)    History outlined in H & P.  Second opinion requested for management ,  Currently averaging one partial complex seizure /tonic seizure per month .  Will start with [place] [facility], [facility] are no [place] neurologist with experience in managing VNS       Other Visit Diagnoses    Seizure disorder [facility](HCC)    -  Primary    Relevant Orders    Ambulatory referral to Neurology      A total of 40 minutes was spent with patient more than half of which was spent in counseling  patient on the above mentioned issues , reviewing and explaining recent labs and imaging studies done, and coordination of care. I am having Ms. Mcever maintain her lamoTRIgine, diazepam, QUEtiapine, pramipexole, cephALEXin, and diazepam.  No orders of the defined types were placed in this encounter.    There are no discontinued medications.  Follow-up: No Follow-up on file.   Sherlene ShamsULLO, Cristle Jared L, MD

## 2015-05-24 NOTE — Assessment & Plan Note (Signed)
History outlined in H & P.  Second opinion requested for management ,  Currently averaging one partial complex seizure /tonic seizure per month .  Will start with [place] neurology, followd by [facility] if there are no [place] neurologist with experience in managing VNS

## 2015-06-06 ENCOUNTER — Telehealth: Payer: Self-pay | Admitting: Internal Medicine

## 2015-06-06 NOTE — Telephone Encounter (Signed)
[facility] is requiring hard copies of Lindsey [name]'s previous Neurology evaluation before they Lindsey decide whether they Lindsey see her. .  We cannot provide these because they cannot be printed from the EPIC portal that allows [place] to see [facility] notes.  Mother Lindsey have to request them from [facility] and then when we receive them we can forward them.  Lindsey [name] Lindsey [name] [facility] not see her? They can see her notes easily.

## 2015-06-06 NOTE — Telephone Encounter (Signed)
Patient DPR will bring copy of notes in on 06/07/15 [facility]

## 2015-06-06 NOTE — Telephone Encounter (Signed)
I have sent this referral to [facility]. I do not see where [facility] has refused her. [facility] usually schedules their referrals quickly.It looks like Melissa N sent it to [facility]. I will keep you updated.

## 2015-06-23 ENCOUNTER — Other Ambulatory Visit: Payer: Self-pay | Admitting: Internal Medicine

## 2015-06-24 ENCOUNTER — Ambulatory Visit (INDEPENDENT_AMBULATORY_CARE_PROVIDER_SITE_OTHER): Payer: 59 | Admitting: Internal Medicine

## 2015-06-24 ENCOUNTER — Encounter: Payer: Self-pay | Admitting: Internal Medicine

## 2015-06-24 VITALS — BP 120/80 | HR 103 | Temp 97.6°F | Ht 66.0 in | Wt 169.1 lb

## 2015-06-24 DIAGNOSIS — Z01818 Encounter for other preprocedural examination: Secondary | ICD-10-CM

## 2015-06-24 NOTE — Progress Notes (Signed)
Pre visit review using our clinic review tool, if applicable. No additional management support is needed unless otherwise documented below in the visit note. 

## 2015-06-24 NOTE — Progress Notes (Signed)
Subjective:  Patient ID: Lindsey [name], female    DOB: 1986/02/17  Age: 29 y.o. MRN: [id number]  CC: The encounter diagnosis was Preoperative general physical examination.  HPI Lindsey [name] presents for preoperative evaluation .  She is scheduled to have a routine teeth cleaning but due to her disability she requires conscious sedation..  She has no history of prior adverse reaction to sedatives used.   2) She has developed a Small red bump on left thigh.   Mom has been treating with nystaitn and neosporin for the past week.  The nodule is not painful, itchy or draining and is getting smaller.    Outpatient Prescriptions Prior to Visit  Medication Sig Dispense Refill  . diazepam (DIASTAT ACUDIAL) 10 MG GEL Place 20 mg rectally once.    . diazepam (VALIUM) 5 MG tablet TAKE 1 AND 1/2 TABLET AT BEDTIME AS NEEDED 45 tablet 5  . lamoTRIgine (LAMICTAL) 100 MG tablet Take 3 tablets by mouth two times a day.     . pramipexole (MIRAPEX) 0.25 MG tablet TAKE ONE TABLET AT BEDTIME DOSE TO BE INCREASED GRADUALLY AS DIRECTED 90 tablet 3  . QUEtiapine (SEROQUEL) 200 MG tablet Take 1 tablet (200 mg total) by mouth at bedtime. 90 tablet 1  . cephALEXin (KEFLEX) 500 MG capsule Take 1 capsule (500 mg total) by mouth 2 (two) times daily. (Patient not taking: Reported on 06/24/2015) 10 capsule 0   No facility-administered medications prior to visit.    Review of Systems;  Patient denies headache, fevers, malaise, unintentional weight loss, skin rash, eye pain, sinus congestion and sinus pain, sore throat, dysphagia,  hemoptysis , cough, dyspnea, wheezing, chest pain, palpitations, orthopnea, edema, abdominal pain, nausea, melena, diarrhea, constipation, flank pain, dysuria, hematuria, urinary  Frequency, nocturia, numbness, tingling, seizures,  Focal weakness, Loss of consciousness,  Tremor, insomnia, depression, anxiety, and suicidal ideation.      Objective:  BP 120/80 mmHg  Pulse 103   Temp(Src) 97.6 F (36.4 C) (Oral)  Ht  (1.676 m)  Wt 169 lb 2 oz (76.715 kg)  BMI 27.31 kg/m2  SpO2 97%  LMP 01/09/2011  BP Readings from Last 3 Encounters:  06/24/15 120/80  05/23/15 116/72  04/13/15 108/72    Wt Readings from Last 3 Encounters:  06/24/15 169 lb 2 oz (76.715 kg)  05/23/15 171 lb 8 oz (77.792 kg)  04/13/15 180 lb 3.2 oz (81.738 kg)    General appearance: alert, cooperative and appears stated age Ears: normal TM's and external ear canals both ears Throat: lips, mucosa, and tongue normal; teeth and gums normal Neck: no adenopathy, no carotid bruit, supple, symmetrical, trachea midline and thyroid not enlarged, symmetric, no tenderness/mass/nodules Back: symmetric, no curvature. ROM normal. No CVA tenderness. Lungs: clear to auscultation bilaterally Heart: regular rate and rhythm, S1, S2 normal, no murmur, click, rub or gallop Abdomen: soft, non-tender; bowel sounds normal; no masses,  no organomegaly Pulses: 2+ and symmetric Skin: Skin color, texture, turgor normal. No rashes or lesions Lymph nodes: Cervical, supraclavicular, and axillary nodes normal.  No results found for: HGBA1C  Lab Results  Component Value Date   CREATININE 0.63 10/05/2014   CREATININE 0.6 09/10/2012    Lab Results  Component Value Date   WBC 7.0 10/05/2014   HGB 14.2 10/05/2014   HCT 41.8 10/05/2014   PLT 264.0 10/05/2014   GLUCOSE 80 10/05/2014   ALT 34 11/04/2014   AST 26 11/04/2014   NA 138 10/05/2014  K 4.3 10/05/2014   CL 103 10/05/2014   CREATININE 0.63 10/05/2014   BUN 8 10/05/2014   CO2 28 10/05/2014   TSH 2.48 10/05/2014    No results found.  Assessment & Plan:   Problem List Items Addressed This Visit    Preoperative general physical examination - Primary    She has no contraindications to upcoming dental procedures which will require conscious sedation.            I am having Ms. Fayson maintain her lamoTRIgine, diazepam, QUEtiapine,  cephALEXin, diazepam, and pramipexole.  No orders of the defined types were placed in this encounter.    There are no discontinued medications.  Follow-up: No Follow-up on file.   Sherlene Shams, MD

## 2015-06-26 NOTE — Assessment & Plan Note (Signed)
She has no contraindications to upcoming dental procedures which will require conscious sedation.

## 2015-08-04 ENCOUNTER — Other Ambulatory Visit: Payer: Self-pay | Admitting: Internal Medicine

## 2015-09-07 ENCOUNTER — Encounter: Payer: Self-pay | Admitting: Nurse Practitioner

## 2015-09-07 ENCOUNTER — Ambulatory Visit (INDEPENDENT_AMBULATORY_CARE_PROVIDER_SITE_OTHER): Payer: 59 | Admitting: Nurse Practitioner

## 2015-09-07 VITALS — BP 122/74 | HR 98 | Temp 97.1°F | Ht 66.0 in | Wt 157.8 lb

## 2015-09-07 DIAGNOSIS — H01002 Unspecified blepharitis right lower eyelid: Secondary | ICD-10-CM

## 2015-09-07 DIAGNOSIS — H01005 Unspecified blepharitis left lower eyelid: Secondary | ICD-10-CM | POA: Insufficient documentation

## 2015-09-07 MED ORDER — DOXYCYCLINE HYCLATE 100 MG PO TABS: 100.0000 mg | ORAL_TABLET | Freq: Two times a day (BID) | ORAL | Status: DC

## 2015-09-07 NOTE — Progress Notes (Signed)
Patient ID: Lindsey [name], female    DOB: 09-Apr-1986  Age: 30 y.o. MRN: [id number]  CC: Belepharitis   HPI Lindsey PalmsDanielle E [name] presents for CC of right lower eyelid x 1 day.  1) mother presents with patient today with concern about blepharitis. She reports these happen fairly frequently Lindsey [name] is saying "no eyedrops" multiple times and mother reports that she is unable to use a warm compress on her eyelids Denies treatment to date-OTCs  History Lindsey [name] has a past medical history of Temporal lobectomy behavior syndrome (1994); Status post VNS (vagus nerve stimulator) placement (2000); Epilepsy [facility](HCC) (age 65); and Insomnia.   She has past surgical history that includes Tonsillectomy and adenoidectomy (age 89); Abdominal hysterectomy (2005); Brain surgery; and Craniotomy for temporal lobectomy.   Her family history is not on file.She reports that she has never smoked. She has never used smokeless tobacco. She reports that she does not drink alcohol or use illicit drugs.  Outpatient Prescriptions Prior to Visit  Medication Sig Dispense Refill  . diazepam (DIASTAT ACUDIAL) 10 MG GEL Place 20 mg rectally once.    . diazepam (VALIUM) 5 MG tablet TAKE 1 AND 1/2 TABLET AT BEDTIME AS NEEDED 45 tablet 5  . lamoTRIgine (LAMICTAL) 100 MG tablet Take 3 tablets by mouth two times a day.     . pramipexole (MIRAPEX) 0.25 MG tablet TAKE ONE TABLET AT BEDTIME DOSE TO BE INCREASED GRADUALLY AS DIRECTED 90 tablet 3  . QUEtiapine (SEROQUEL) 200 MG tablet TAKE ONE TABLET AT BEDTIME 90 tablet 1  . cephALEXin (KEFLEX) 500 MG capsule Take 1 capsule (500 mg total) by mouth 2 (two) times daily. (Patient not taking: Reported on 06/24/2015) 10 capsule 0   No facility-administered medications prior to visit.    ROS Review of Systems  Constitutional: Negative for fever, chills, diaphoresis and fatigue.  Eyes: Positive for pain. Negative for photophobia, discharge, redness, itching and visual disturbance.  Skin:  Negative for rash.    Objective:  BP 122/74 mmHg  Pulse 98  Temp(Src) 97.1 F (36.2 C) (Axillary)  Ht 5\' 6"  (1.676 m)  Wt 157 lb 12.8 oz (71.578 kg)  BMI 25.48 kg/m2  SpO2 95%  LMP 01/09/2011  Physical Exam  Constitutional: She is oriented to person, place, and time. She appears well-nourished. No distress.  HENT:  Head: Atraumatic.  Right Ear: External ear normal.  Left Ear: External ear normal.  Eyes: Conjunctivae are normal. Right eye exhibits no discharge. Left eye exhibits no discharge. No scleral icterus.  Inner canthus lower lid right eye is slightly swollen, erythematous, not a pustule at this time  Cardiovascular: Normal rate, regular rhythm and normal heart sounds.   Pulmonary/Chest: Effort normal and breath sounds normal. No respiratory distress. She has no wheezes. She has no rales. She exhibits no tenderness.  Neurological: She is alert and oriented to person, place, and time. No cranial nerve deficit. She exhibits normal muscle tone. Coordination normal.  Skin: Skin is warm and dry. No rash noted. She is not diaphoretic.  Psychiatric: She has a normal mood and affect. Her behavior is normal. Judgment and thought content normal.   Assessment & Plan:   There are no diagnoses linked to this encounter. I have discontinued Ms. Morrical's cephALEXin. I am also having her start on doxycycline. Additionally, I am having her maintain her lamoTRIgine, diazepam, diazepam, pramipexole, and QUEtiapine.  Meds ordered this encounter  Medications  . doxycycline (VIBRA-TABS) 100 MG tablet    Sig: Take 1  tablet (100 mg total) by mouth 2 (two) times daily.    Dispense:  14 tablet    Refill:  0    Order Specific Question:  Supervising Provider    Answer:  Sherlene Shams [[id number]]     Follow-up: Return if symptoms worsen or fail to improve.

## 2015-09-07 NOTE — Patient Instructions (Signed)
Doxycyline twice daily x 7 days.   Please take a probiotic ( Align, Floraque or Culturelle) while you are on the antibiotic to prevent a serious antibiotic associated diarrhea  Called clostirudium dificile colitis and a vaginal yeast infection.    Blepharitis Blepharitis is inflammation of the eyelids. Blepharitis may happen with:  Reddish, scaly skin around the scalp and eyebrows.  Burning or itching of the eyelids.  Eye discharge at night that causes the eyelashes to stick together in the morning.  Eyelashes that fall out.  Sensitivity to light. HOME CARE INSTRUCTIONS Pay attention to any changes in how you look or feel. Follow these instructions to help with your condition: Keeping Clean  Wash your hands often.  Wash your eyelids with warm water or with warm water that is mixed with a small amount of baby shampoo. Do this two times per day or as often as needed.  Wash your face and eyebrows at least once a day.  Use a clean towel each time you dry your eyelids. Do not use this towel to clean or dry other areas of your body. Do not share your towel with anyone. General Instructions  Avoid wearing makeup until you get better. Do not share makeup with anyone.  Avoid rubbing your eyes.  Apply warm compresses to your eyes 2 times per day for 10 minutes at a time, or as told by your health care provider.  If you were prescribed an antibiotic ointment or steroid drops, apply or use the medicine as told by your health care provider. Do not stop using the medicine even if you feel better.  Keep all follow-up visits as told by your health care provider. This is important. SEEK MEDICAL CARE IF:  Your eyelids feel hot.  You have blisters or a rash on your eyelids.  The condition does not go away in 2-4 days.  The inflammation gets worse. SEEK IMMEDIATE MEDICAL CARE IF:  You have pain or redness that gets worse or spreads to other parts of your face.  Your vision  changes.  You have pain when looking at lights or moving objects.  You have a fever.   This information is not intended to replace advice given to you by your health care provider. Make sure you discuss any questions you have with your health care provider.   Document Released: 04/20/2000 Document Revised: 01/12/2015 Document Reviewed: 08/16/2014 Elsevier Interactive Patient Education [employer]2016 Elsevier Inc.

## 2015-09-07 NOTE — Progress Notes (Signed)
Pre visit review using our clinic review tool, if applicable. No additional management support is needed unless otherwise documented below in the visit note. 

## 2015-09-07 NOTE — Assessment & Plan Note (Addendum)
New onset Doxycycline sent to pharmacy 3 weeks of probiotics Follow-up if worsening or failure to improve

## 2015-09-12 ENCOUNTER — Telehealth: Payer: Self-pay | Admitting: *Deleted

## 2015-09-12 ENCOUNTER — Other Ambulatory Visit: Payer: Self-pay | Admitting: Internal Medicine

## 2015-09-12 ENCOUNTER — Telehealth: Payer: Self-pay | Admitting: Internal Medicine

## 2015-09-12 MED ORDER — AZITHROMYCIN 250 MG PO TABS: ORAL_TABLET | ORAL | Status: DC

## 2015-09-12 MED ORDER — DOXYCYCLINE HYCLATE 100 MG PO TABS: 100.0000 mg | ORAL_TABLET | Freq: Two times a day (BID) | ORAL | Status: DC

## 2015-09-12 NOTE — Telephone Encounter (Signed)
Patient was seen on 5/3 with C.Doss.  Per patients mom eye is still the same.  Lyla SonCarrie had prescribed doxycyline and another antibiotic.  Please advise for a refill on the the Doxycycline.  Thanks

## 2015-09-12 NOTE — Telephone Encounter (Signed)
5 day course of axithromycin and 2 more weeks of doxycycline sent to [facility].  Continue probiotic

## 2015-09-12 NOTE — Telephone Encounter (Signed)
Pharmacy notified.

## 2015-09-12 NOTE — Telephone Encounter (Signed)
Patients mother stated that daughter will need another Rx for the doxycycline, she stated the eye infection has not cleared up, she has one dose left. Please advise [phone]

## 2015-09-12 NOTE — Telephone Encounter (Signed)
Yes I am aware of the potential reaction

## 2015-09-12 NOTE — Telephone Encounter (Signed)
Gave message to the patients Grandma that prescriptions are at the pharmacy. She will pass the message on to Surah's mom.  thanks

## 2015-09-12 NOTE — Telephone Encounter (Signed)
Pharmacy called about drug interaction between Azithromycin and QUEtiapine (SEROQUEL) 200 MG tablet . They wanted to make sure it was still okay to order.

## 2015-10-05 ENCOUNTER — Telehealth: Payer: Self-pay | Admitting: *Deleted

## 2015-10-05 NOTE — Telephone Encounter (Signed)
Patient has been experiencing Vertigo for the past week, can this be a Monday night visit? thanks

## 2015-10-05 NOTE — Telephone Encounter (Signed)
Patient currently has been experiencing vertigo off and on. Would it be ok to place patient on Dr. Darrick Huntsmanullo schedule on Monday at 5:30 pm, if not please advise a place for pt.

## 2015-10-05 NOTE — Telephone Encounter (Signed)
yes

## 2015-10-06 NOTE — Telephone Encounter (Signed)
Scheduled

## 2015-10-10 ENCOUNTER — Ambulatory Visit (INDEPENDENT_AMBULATORY_CARE_PROVIDER_SITE_OTHER): Payer: 59 | Admitting: Internal Medicine

## 2015-10-10 ENCOUNTER — Encounter: Payer: Self-pay | Admitting: Internal Medicine

## 2015-10-10 VITALS — BP 108/72 | HR 88 | Temp 98.5°F | Ht 66.0 in | Wt 152.1 lb

## 2015-10-10 DIAGNOSIS — E663 Overweight: Secondary | ICD-10-CM | POA: Diagnosis not present

## 2015-10-10 DIAGNOSIS — Z79899 Other long term (current) drug therapy: Secondary | ICD-10-CM

## 2015-10-10 DIAGNOSIS — R42 Dizziness and giddiness: Secondary | ICD-10-CM

## 2015-10-10 MED ORDER — PREDNISONE 20 MG PO TABS: 60.0000 mg | ORAL_TABLET | Freq: Every day | ORAL | Status: DC

## 2015-10-10 NOTE — Patient Instructions (Addendum)
The dizziness may be from some  fluid behind the ear  Let's try prednisone 60 mg for 5 days,  alonf with otc Sudafed PE 10 mg every 6 hours   We are checking a lamictal level with her labs today

## 2015-10-10 NOTE — Progress Notes (Signed)
Pre visit review using our clinic review tool, if applicable. No additional management support is needed unless otherwise documented below in the visit note. 

## 2015-10-10 NOTE — Progress Notes (Signed)
Subjective:  Patient ID: Lindsey [name], female    DOB: December 01, 1985  Age: 30 y.o. MRN: [id number]  CC: The primary encounter diagnosis was Dizziness. Diagnoses of Long-term use of high-risk medication and Overweight were also pertinent to this visit.  HPI Lindsey [name] presents for recurrent vertigo .  Patient 's history is provided by her primary caregiver , her mother.  Due to patient's condition of  cerebral palsy. mother states hat for the past several weeks, she has been unsteady on her feet and c/o dizziness,  The symptoms occur mostly in the morning,and improve later in the day but have occurred in  both fasting and non fasting states .  She denies headache,  Sinus congestion and ear pain. Has not been swimming.   Outpatient Prescriptions Prior to Visit  Medication Sig Dispense Refill  . azithromycin (ZITHROMAX) 250 MG tablet 2 tablets on day 1, then once tablet daily until gone 6 tablet 0  . diazepam (DIASTAT ACUDIAL) 10 MG GEL Place 20 mg rectally once.    . diazepam (VALIUM) 5 MG tablet TAKE 1 AND 1/2 TABLET AT BEDTIME AS NEEDED 45 tablet 5  . lamoTRIgine (LAMICTAL) 100 MG tablet Take 3 tablets by mouth two times a day.     . pramipexole (MIRAPEX) 0.25 MG tablet TAKE ONE TABLET AT BEDTIME DOSE TO BE INCREASED GRADUALLY AS DIRECTED 90 tablet 3  . QUEtiapine (SEROQUEL) 200 MG tablet TAKE ONE TABLET AT BEDTIME 90 tablet 1  . doxycycline (VIBRA-TABS) 100 MG tablet Take 1 tablet (100 mg total) by mouth 2 (two) times daily. 28 tablet 0   No facility-administered medications prior to visit.    Review of Systems;  Patient denies headache, fevers, malaise, unintentional weight loss, skin rash, eye pain, sinus congestion and sinus pain, sore throat, dysphagia,  hemoptysis , cough, dyspnea, wheezing, chest pain, palpitations, orthopnea, edema, abdominal pain, nausea, melena, diarrhea, constipation, flank pain, dysuria, hematuria, urinary  Frequency, nocturia, numbness, tingling,  seizures,  Focal weakness, Loss of consciousness,  Tremor, insomnia, depression, anxiety, and suicidal ideation.      Objective:  BP 108/72 mmHg  Pulse 88  Temp(Src) 98.5 F (36.9 C) (Axillary)  Ht _0  (1.676 m)  Wt 152 lb 2 oz (69.003 kg)  BMI 24.57 kg/m2  SpO2 97%  LMP 01/09/2011  BP Readings from Last 3 Encounters:  10/10/15 108/72  09/07/15 122/74  06/24/15 120/80    Wt Readings from Last 3 Encounters:  10/10/15 152 lb 2 oz (69.003 kg)  09/07/15 157 lb 12.8 oz (71.578 kg)  06/24/15 169 lb 2 oz (76.715 kg)    General appearance: alert, cooperative and appears stated age Ears: normal TM's and external ear canals both ears Throat: lips, mucosa, and tongue normal; teeth and gums normal Neck: no adenopathy, no carotid bruit, supple, symmetrical, trachea midline and thyroid not enlarged, symmetric, no tenderness/mass/nodules Lungs: clear to auscultation bilaterally Heart: regular rate and rhythm, S1, S2 normal, no murmur, click, rub or gallop Abdomen: soft, non-tender; bowel sounds normal; no masses,  no organomegaly Pulses: 2+ and symmetric Skin: Skin color, texture, turgor normal. No rashes or lesions Lymph nodes: Cervical, supraclavicular, and axillary nodes normal. Neuro:  No nystagmus,  Cannot cooperate with exam.  No results found for: HGBA1C  Lab Results  Component Value Date   CREATININE 0.79 10/11/2015   CREATININE 0.63 10/05/2014   CREATININE 0.6 09/10/2012    Lab Results  Component Value Date   WBC 4.8 10/11/2015  HGB 13.7 10/11/2015   HCT 39.9 10/11/2015   PLT 217 10/11/2015   GLUCOSE 98 10/11/2015   ALT 26 10/11/2015   AST 31 10/11/2015   NA 137 10/11/2015   K 3.3* 10/11/2015   CL 106 10/11/2015   CREATININE 0.79 10/11/2015   BUN 9 10/11/2015   CO2 25 10/11/2015   TSH 2.48 10/05/2014     Assessment & Plan:   Problem List Items Addressed This Visit    Overweight    I have congratulated mother in reduction of   BMI normal by cutting  out snacks,       Dizziness - Primary    May be due to eustachian tube dysfunction   Trial of sudafed PE and prednisone x 5 days       Long-term use of high-risk medication    Checking CBC, CMET today given patient's long term use of lamictal      Relevant Orders   CBC with Differential (Completed)   Lamotrigine level   Comp Met (CMET) (Completed)     A total of 25 minutes of face to face time was spent with patient more than half of which was spent in counselling about the above mentioned conditions  and coordination of care  I have discontinued Ms. Claus's doxycycline. I am also having her start on predniSONE. Additionally, I am having her maintain her lamoTRIgine, diazepam, diazepam, pramipexole, QUEtiapine, and azithromycin.  Meds ordered this encounter  Medications  . predniSONE (DELTASONE) 20 MG tablet    Sig: Take 3 tablets (60 mg total) by mouth daily with breakfast.    Dispense:  15 tablet    Refill:  0    Medications Discontinued During This Encounter  Medication Reason  . doxycycline (VIBRA-TABS) 100 MG tablet Error    Follow-up: No Follow-up on file.   Crecencio Mc, MD

## 2015-10-11 ENCOUNTER — Other Ambulatory Visit
Admission: RE | Admit: 2015-10-11 | Discharge: 2015-10-11 | Disposition: A | Discharge status: Home / Self Care | Payer: 59 | Source: Ambulatory Visit | Attending: Internal Medicine | Admitting: Internal Medicine

## 2015-10-11 DIAGNOSIS — R42 Dizziness and giddiness: Secondary | ICD-10-CM | POA: Insufficient documentation

## 2015-10-11 DIAGNOSIS — Z79899 Other long term (current) drug therapy: Secondary | ICD-10-CM | POA: Diagnosis not present

## 2015-10-11 LAB — COMPREHENSIVE METABOLIC PANEL
ALBUMIN: 4.6 g/dL (ref 3.5–5.0)
ALK PHOS: 58 U/L (ref 38–126)
ALT: 26 U/L (ref 14–54)
ANION GAP: 6 (ref 5–15)
AST: 31 U/L (ref 15–41)
BILIRUBIN TOTAL: 0.4 mg/dL (ref 0.3–1.2)
BUN: 9 mg/dL (ref 6–20)
CALCIUM: 9.2 mg/dL (ref 8.9–10.3)
CO2: 25 mmol/L (ref 22–32)
Chloride: 106 mmol/L (ref 101–111)
Creatinine, Ser: 0.79 mg/dL (ref 0.44–1.00)
GFR calc Af Amer: >60 mL/min (ref >60)
GFR calc non Af Amer: >60 mL/min (ref >60)
GLUCOSE: 98 mg/dL (ref 65–99)
Potassium: 3.3 mmol/L — ABNORMAL LOW (ref 3.5–5.1)
Sodium: 137 mmol/L (ref 135–145)
TOTAL PROTEIN: 7.2 g/dL (ref 6.5–8.1)

## 2015-10-11 LAB — CBC WITH DIFFERENTIAL/PLATELET
BASOS ABS: 0 10*3/uL (ref 0–0.1)
BASOS PCT: 1 %
EOS PCT: 0 %
Eosinophils Absolute: 0 10*3/uL (ref 0–0.7)
HEMATOCRIT: 39.9 % (ref 35.0–47.0)
Hemoglobin: 13.7 g/dL (ref 12.0–16.0)
Lymphocytes Relative: 33 %
Lymphs Abs: 1.6 10*3/uL (ref 1.0–3.6)
MCH: 28.9 pg (ref 26.0–34.0)
MCHC: 34.5 g/dL (ref 32.0–36.0)
MCV: 83.9 fL (ref 80.0–100.0)
MONO ABS: 0.3 10*3/uL (ref 0.2–0.9)
MONOS PCT: 7 %
NEUTROS ABS: 2.8 10*3/uL (ref 1.4–6.5)
Neutrophils Relative %: 59 %
PLATELETS: 217 10*3/uL (ref 150–440)
RBC: 4.75 MIL/uL (ref 3.80–5.20)
RDW: 12.3 % (ref 11.5–14.5)
WBC: 4.8 10*3/uL (ref 3.6–11.0)

## 2015-10-11 NOTE — Assessment & Plan Note (Signed)
Checking CBC, CMET today given patient's long term use of lamictal

## 2015-10-11 NOTE — Assessment & Plan Note (Signed)
May be due to eustachian tube dysfunction   Trial of sudafed PE and prednisone x 5 days

## 2015-10-11 NOTE — Assessment & Plan Note (Signed)
I have congratulated mother in reduction of   BMI normal by cutting out snacks,

## 2015-10-12 ENCOUNTER — Telehealth: Payer: Self-pay | Admitting: Internal Medicine

## 2015-10-12 ENCOUNTER — Encounter: Payer: Self-pay | Admitting: Internal Medicine

## 2015-10-12 LAB — LAMOTRIGINE LEVEL: Lamotrigine Lvl: 22.6 ug/mL — ABNORMAL HIGH (ref 2.0–20.0)

## 2015-10-12 NOTE — Telephone Encounter (Signed)
Patient Dpr notified of results and voiced understanding to potassium rich foods.

## 2015-10-12 NOTE — Telephone Encounter (Signed)
Potassium level is a little low and lamictal level is pending,  Other labs are normal , rather than give her a horse pill to swallow,  Increase her intake of potassium rich foods

## 2015-10-13 ENCOUNTER — Telehealth: Payer: Self-pay | Admitting: Internal Medicine

## 2015-10-13 NOTE — Telephone Encounter (Signed)
CORRECTION:  Kennie's   DOSE OF LAMICTAL SHOULD BE REDUCED TO 200 MG TWICE DAILY,  Not  400

## 2015-10-13 NOTE — Telephone Encounter (Signed)
Patient DPR  Ask for lab results to be faxed to neurologist and have neurologist adjust dose. DPR is aware of 200 mg dose twice per day.

## 2015-10-14 ENCOUNTER — Telehealth: Payer: Self-pay | Admitting: Internal Medicine

## 2015-10-14 NOTE — Telephone Encounter (Signed)
Placed in quick sign. 

## 2015-10-14 NOTE — Telephone Encounter (Signed)
Pt's mother dropped off a medical visit form to be signed by Dr. Darrick Huntsmanullo. Paper will be placed in Dr. Melina Schoolsullo's box.

## 2015-10-18 ENCOUNTER — Encounter: Payer: Self-pay | Admitting: Family

## 2015-10-18 ENCOUNTER — Other Ambulatory Visit
Admission: RE | Admit: 2015-10-18 | Discharge: 2015-10-18 | Disposition: A | Discharge status: Home / Self Care | Payer: 59 | Source: Ambulatory Visit | Attending: Family | Admitting: Family

## 2015-10-18 ENCOUNTER — Ambulatory Visit (INDEPENDENT_AMBULATORY_CARE_PROVIDER_SITE_OTHER): Payer: 59 | Admitting: Family

## 2015-10-18 VITALS — BP 130/72 | HR 101 | Temp 97.6°F | Wt 153.6 lb

## 2015-10-18 DIAGNOSIS — R1111 Vomiting without nausea: Secondary | ICD-10-CM | POA: Insufficient documentation

## 2015-10-18 DIAGNOSIS — G40909 Epilepsy, unspecified, not intractable, without status epilepticus: Secondary | ICD-10-CM

## 2015-10-18 LAB — CBC WITH DIFFERENTIAL/PLATELET
Basophils Absolute: 0 10*3/uL (ref 0–0.1)
EOS ABS: 0 10*3/uL (ref 0–0.7)
HCT: 41.9 % (ref 35.0–47.0)
Hemoglobin: 14.3 g/dL (ref 12.0–16.0)
LYMPHS ABS: 1.6 10*3/uL (ref 1.0–3.6)
Lymphocytes Relative: 14 %
MCH: 28.7 pg (ref 26.0–34.0)
MCHC: 34.1 g/dL (ref 32.0–36.0)
MCV: 84 fL (ref 80.0–100.0)
Monocytes Absolute: 0.6 10*3/uL (ref 0.2–0.9)
Neutro Abs: 9.5 10*3/uL — ABNORMAL HIGH (ref 1.4–6.5)
Neutrophils Relative %: 81 %
PLATELETS: 265 10*3/uL (ref 150–440)
RBC: 4.99 MIL/uL (ref 3.80–5.20)
RDW: 12.4 % (ref 11.5–14.5)
WBC: 11.7 10*3/uL — AB (ref 3.6–11.0)

## 2015-10-18 LAB — COMPREHENSIVE METABOLIC PANEL
ALT: 19 U/L (ref 14–54)
AST: 22 U/L (ref 15–41)
Albumin: 4.4 g/dL (ref 3.5–5.0)
Alkaline Phosphatase: 53 U/L (ref 38–126)
Anion gap: 11 (ref 5–15)
BUN: 11 mg/dL (ref 6–20)
CALCIUM: 9.3 mg/dL (ref 8.9–10.3)
CHLORIDE: 100 mmol/L — AB (ref 101–111)
CO2: 23 mmol/L (ref 22–32)
CREATININE: 0.68 mg/dL (ref 0.44–1.00)
Glucose, Bld: 110 mg/dL — ABNORMAL HIGH (ref 65–99)
POTASSIUM: 3.5 mmol/L (ref 3.5–5.1)
SODIUM: 134 mmol/L — AB (ref 135–145)
Total Bilirubin: 0.9 mg/dL (ref 0.3–1.2)
Total Protein: 7.2 g/dL (ref 6.5–8.1)

## 2015-10-18 LAB — LIPASE, BLOOD: LIPASE: 18 U/L (ref 11–51)

## 2015-10-18 NOTE — Progress Notes (Signed)
Subjective:    Patient ID: Lindsey [name], female    DOB: 27-Sep-1985, 30 y.o.   MRN: [id number]   Lindsey [name] is a 30 y.o. female who presents today for an acute visit.    HPI Comments: Patient here for evaluation of 2 seizures yesterday. First one yesterday at 1am for 6 minutes resolved with one diastat. Yesterday at 1130pm for 16 minutes and had to give with two doses of diastat. She vomited after each one, undigested food and no blood.  Patient accompanied by her mother who does most of the talking. Patient does state her stomach 'feels sick.' Mom thought she felt tactile warm after vomiting, and gave her ibuprofen. Decreased appetite past today. Drinking fluids well. Urinating. Regular BMs.   Patient has complex history of cerebral palsy and ntractable complex partial seizure disorder since [age] years old with vagal nerve stimulator. Follows with [facility],   Per chart review,6/7 Lamictal level was elevated and PCP reduced her dose to  in the morning and  at night from 300 mg BID. Mother thinks the decrease in dose may have affected seizures. States drug level drawn after morning dose of lamictal. Would like for it to be rechecked today, and she will let her daughter's neurologist know about level and 2 seizures.   Seen for OV for dizziness 6/5. WBC WNL. CMET showed slightly decreased potassium at 3.3.                   Past Medical History  Diagnosis Date  . Temporal lobectomy behavior syndrome 1994    Right  . Status post VNS (vagus nerve stimulator) placement 2000  . Epilepsy [facility]) age 40    negative metabolic workup has brain abnormalities by MRI (Dr. Quintin Alto, [facility])  . Insomnia    Allergies: Influenza virus vaccine split; Benadryl; and Zyrtec Current Outpatient Prescriptions on File Prior to Visit  Medication Sig Dispense Refill  . diazepam (DIASTAT ACUDIAL) 10 MG GEL Place 20 mg rectally once.    . diazepam (VALIUM) 5 MG tablet TAKE 1 AND 1/2 TABLET AT  BEDTIME AS NEEDED 45 tablet 5  . lamoTRIgine (LAMICTAL) 100 MG tablet Take 100 mg by mouth. Pt takes 2 tabs in the morning and three tabs in the evening.    . pramipexole (MIRAPEX) 0.25 MG tablet TAKE ONE TABLET AT BEDTIME DOSE TO BE INCREASED GRADUALLY AS DIRECTED 90 tablet 3  . QUEtiapine (SEROQUEL) 200 MG tablet TAKE ONE TABLET AT BEDTIME 90 tablet 1   No current facility-administered medications on file prior to visit.    Social History  Substance Use Topics  . Smoking status: Never Smoker   . Smokeless tobacco: Never Used  . Alcohol Use: No    Review of Systems  Constitutional: Negative for fever and chills.  HENT: Negative for congestion.   Respiratory: Negative for cough.   Cardiovascular: Negative for chest pain and palpitations.  Gastrointestinal: Positive for vomiting and abdominal pain. Negative for nausea, diarrhea and abdominal distention.  Genitourinary: Negative for dysuria.      Objective:    BP 130/72 mmHg  Pulse 101  Temp(Src) 97.6 F (36.4 C)  Wt 153 lb 9.6 oz (69.673 kg)  SpO2 97%  LMP 01/09/2011   Physical Exam  Constitutional: She appears well-developed and well-nourished.  Eyes: Conjunctivae are normal.  Cardiovascular: Normal rate, regular rhythm, normal heart sounds and normal pulses.   Pulmonary/Chest: Effort normal and breath sounds normal. She has no wheezes. She  has no rhonchi. She has no rales.  Abdominal: Soft. Normal appearance and bowel sounds are normal. She exhibits no distension, no fluid wave, no ascites and no mass. There is generalized tenderness. There is no rigidity, no rebound, no guarding, no CVA tenderness, no tenderness at McBurney's point and negative Murphy's sign.  Neurological: She is alert.  Skin: Skin is warm and dry.  Psychiatric: She has a normal mood and affect. Her speech is normal and behavior is normal. Thought content normal.  Vitals reviewed.      Assessment & Plan:  1. Seizure disorder, primary  (HCC) Checking drug level. Will consult with PCP if any changes of medication are needed. Advised continued f/u with neurology.  - Lamotrigine level  2. Non-intractable vomiting without nausea, vomiting of unspecified type Resolved. Patient had mild tenderness of abdomen however she did not want for me to examine her as she was quite concerned about lab draw. I have a low clinical suspicion that patient has acute abdomen with no focal tenderness, guarding, rigidity. Pending lab work to evaluate for leukocytosis, electrolyte abnormalities, source of abdominal pain.  - CBC with Differential/Platelet; Future - Comprehensive metabolic panel; Future - Lipase; Future     I have discontinued Ms. Stradford's azithromycin and predniSONE. I am also having her maintain her lamoTRIgine, diazepam, diazepam, pramipexole, and QUEtiapine.   No orders of the defined types were placed in this encounter.     Start medications as prescribed and explained to patient on After Visit Summary ( AVS). Risks, benefits, and alternatives of the medications and treatment plan prescribed today were discussed, and patient expressed understanding.   Education regarding symptom management and diagnosis given to patient.   Follow-up:Plan follow-up and return precautions given if any worsening symptoms or change in condition.   Continue to follow with Sherlene ShamsULLO, TERESA L, MD for routine health maintenance.   Lindsey Palmsanielle E Levitt and I agreed with plan.   Rennie PlowmanMargaret Cindie Rajagopalan, FNP

## 2015-10-18 NOTE — Addendum Note (Signed)
Addended by: Felix AhmadiFRANSEN, Heena Woodbury A on: 10/18/2015 02:05 PM   Modules accepted: Orders

## 2015-10-18 NOTE — Patient Instructions (Signed)
Lab work.   If there is no improvement in your symptoms, or if there is any worsening of symptoms, or if you have any additional concerns, please return for re-evaluation; or, if we are closed, consider going to the Emergency Room for evaluation if symptoms urgent.  Abdominal Pain, Adult Many things can cause abdominal pain. Usually, abdominal pain is not caused by a disease and will improve without treatment. It can often be observed and treated at home. Your health care provider will do a physical exam and possibly order blood tests and X-rays to help determine the seriousness of your pain. However, in many cases, more time must pass before a clear cause of the pain can be found. Before that point, your health care provider may not know if you need more testing or further treatment. HOME CARE INSTRUCTIONS Monitor your abdominal pain for any changes. The following actions may help to alleviate any discomfort you are experiencing:  Only take over-the-counter or prescription medicines as directed by your health care provider.  Do not take laxatives unless directed to do so by your health care provider.  Try a clear liquid diet (broth, tea, or water) as directed by your health care provider. Slowly move to a bland diet as tolerated. SEEK MEDICAL CARE IF:  You have unexplained abdominal pain.  You have abdominal pain associated with nausea or diarrhea.  You have pain when you urinate or have a bowel movement.  You experience abdominal pain that wakes you in the night.  You have abdominal pain that is worsened or improved by eating food.  You have abdominal pain that is worsened with eating fatty foods.  You have a fever. SEEK IMMEDIATE MEDICAL CARE IF:  Your pain does not go away within 2 hours.  You keep throwing up (vomiting).  Your pain is felt only in portions of the abdomen, such as the right side or the left lower portion of the abdomen.  You pass bloody or black tarry  stools. MAKE SURE YOU:  Understand these instructions.  Will watch your condition.  Will get help right away if you are not doing well or get worse.   This information is not intended to replace advice given to you by your health care provider. Make sure you discuss any questions you have with your health care provider.   Document Released: 01/31/2005 Document Revised: 01/12/2015 Document Reviewed: 12/31/2012 Elsevier Interactive Patient Education [employer]2016 Elsevier Inc.

## 2015-10-19 ENCOUNTER — Telehealth: Payer: Self-pay | Admitting: Internal Medicine

## 2015-10-19 LAB — LAMOTRIGINE LEVEL: Lamotrigine Lvl: 7.3 ug/mL (ref 2.0–20.0)

## 2015-10-19 NOTE — Telephone Encounter (Signed)
Pt saw her neurologist, Dr. Kristeen Mansadtkes. He going to up her  lamoTRIgine (LAMICTAL) 100 MG tablet to 300ml.

## 2015-10-19 NOTE — Telephone Encounter (Signed)
FYI - noted in chart

## 2015-10-20 ENCOUNTER — Telehealth: Payer: Self-pay | Admitting: Family

## 2015-10-20 NOTE — Telephone Encounter (Signed)
Discussed lab results with mother, discussed the mild elevation in white blood cells and how I suspected this was related to her seizures that she had this past week. Her mother stated that she was not complaining of abdominal pain and was feeling better. She does state she started of mild cough yesterday; I advised mom to closely observe cough. Also observe for fever, chills and us our office know if her daughter starts to feel ill. Leave for beach on Sunday. Thanked me for my phonecall.

## 2015-10-24 ENCOUNTER — Telehealth: Payer: Self-pay | Admitting: Internal Medicine

## 2015-10-24 NOTE — Telephone Encounter (Signed)
Spoke to patients mother about the labs drawn at the hospital. All labs were drawn, just one was not released on my chart, the Lamotrigine level.  Patient had no question comments or concerns at this time.

## 2015-10-24 NOTE — Telephone Encounter (Signed)
Pt mom called about pt going to the hospital on 06/13 to have lab work done. Mom wanted to know if the Lamictal was done?    Call mom @ [phone]. Thank you!

## 2015-10-26 ENCOUNTER — Other Ambulatory Visit: Payer: Self-pay | Admitting: Internal Medicine

## 2015-11-10 ENCOUNTER — Encounter: Payer: Self-pay | Admitting: Family Medicine

## 2015-11-10 ENCOUNTER — Other Ambulatory Visit: Payer: Self-pay | Admitting: Internal Medicine

## 2015-11-10 ENCOUNTER — Ambulatory Visit (INDEPENDENT_AMBULATORY_CARE_PROVIDER_SITE_OTHER): Payer: 59 | Admitting: Family Medicine

## 2015-11-10 VITALS — BP 114/72 | HR 91 | Temp 98.4°F | Ht 66.0 in | Wt 154.0 lb

## 2015-11-10 DIAGNOSIS — K5900 Constipation, unspecified: Secondary | ICD-10-CM | POA: Diagnosis not present

## 2015-11-10 MED ORDER — DIAZEPAM 5 MG PO TABS: ORAL_TABLET | ORAL | Status: DC

## 2015-11-10 NOTE — Assessment & Plan Note (Signed)
Patient's symptoms consistent with constipation. Benign abdominal exam. Well-appearing today. Has started to have bowel movements. Discussed strategies to help her have consistent bowel movements. Suggested fiber or MiraLAX. Stay well hydrated. They will continue to monitor. Given return precautions.

## 2015-11-10 NOTE — Progress Notes (Signed)
Pre visit review using our clinic review tool, if applicable. No additional management support is needed unless otherwise documented below in the visit note. 

## 2015-11-10 NOTE — Progress Notes (Signed)
Patient ID: Lindsey [name], female   DOB: 07-09-1985, 30 y.o.   MRN: [id number]  Marikay AlarEric Chaunda Vandergriff, MD Phone: [phone]  Lindsey PalmsDanielle E [name] is a 30 y.o. female who presents today for same-day visit.  Constipation: Patient's mother provides most of the history. Notes patient had a good bowel movement on Saturday. Monday noted some straining and hard balls with her bowel movement. Took a stool softener. Tuesday had Dulcolax. No bowel movement on Wednesday morning. Took a glycerin suppository. Wednesday evening she had a bowel movement and this morning was going as well. No abdominal pain with this. No nausea or vomiting. Notes this is a recurrent issue. Does not take anything consistently to help soften up her bowel movements. Tried MiraLAX in the past though the patient evidently did not like the taste.   ROS see history of present illness  Objective  Physical Exam Filed Vitals:   11/10/15 1454  BP: 114/72  Pulse: 91  Temp: 98.4 F (36.9 C)    BP Readings from Last 3 Encounters:  11/10/15 114/72  10/18/15 130/72  10/10/15 108/72   Wt Readings from Last 3 Encounters:  11/10/15 154 lb (69.854 kg)  10/18/15 153 lb 9.6 oz (69.673 kg)  10/10/15 152 lb 2 oz (69.003 kg)    Physical Exam  Constitutional: No distress.  Cardiovascular: Normal rate, regular rhythm and normal heart sounds.   Pulmonary/Chest: Effort normal and breath sounds normal.  Abdominal: Soft. Bowel sounds are normal. She exhibits no distension. There is no tenderness.  Skin: Skin is warm and dry. She is not diaphoretic.     Assessment/Plan: Please see individual problem list.  Constipation Patient's symptoms consistent with constipation. Benign abdominal exam. Well-appearing today. Has started to have bowel movements. Discussed strategies to help her have consistent bowel movements. Suggested fiber or MiraLAX. Stay well hydrated. They will continue to monitor. Given return precautions.   Marikay AlarEric Fumi Guadron,  MD [facility] [place]

## 2015-11-10 NOTE — Telephone Encounter (Signed)
Diazepam refilled

## 2015-11-10 NOTE — Patient Instructions (Signed)
Nice to meet you. I'm glad you're having bowel movements now. You can try adding fiber supplements or MiraLAX daily to see if that will help. You could try Dulcolax if you are constipated. If you develops abdominal pain, nausea, vomiting, blood in her stool, or any new or change in symptoms please seek medical attention.

## 2015-11-11 ENCOUNTER — Ambulatory Visit: Payer: 59 | Admitting: Family

## 2015-11-21 ENCOUNTER — Other Ambulatory Visit: Payer: Self-pay | Admitting: Internal Medicine

## 2015-11-25 ENCOUNTER — Encounter: Payer: Self-pay | Admitting: Internal Medicine

## 2015-12-20 ENCOUNTER — Ambulatory Visit (INDEPENDENT_AMBULATORY_CARE_PROVIDER_SITE_OTHER): Payer: 59 | Admitting: Internal Medicine

## 2015-12-20 VITALS — BP 120/78 | HR 101 | Temp 97.5°F | Resp 18 | Wt 146.1 lb

## 2015-12-20 DIAGNOSIS — Z79899 Other long term (current) drug therapy: Secondary | ICD-10-CM

## 2015-12-20 DIAGNOSIS — Z0000 Encounter for general adult medical examination without abnormal findings: Secondary | ICD-10-CM | POA: Diagnosis not present

## 2015-12-20 LAB — COMPREHENSIVE METABOLIC PANEL
ALK PHOS: 56 U/L (ref 39–117)
ALT: 17 U/L (ref 0–35)
AST: 21 U/L (ref 0–37)
Albumin: 4.7 g/dL (ref 3.5–5.2)
BILIRUBIN TOTAL: 0.4 mg/dL (ref 0.2–1.2)
BUN: 8 mg/dL (ref 6–23)
CALCIUM: 9.7 mg/dL (ref 8.4–10.5)
CO2: 25 mEq/L (ref 19–32)
CREATININE: 0.71 mg/dL (ref 0.40–1.20)
Chloride: 103 mEq/L (ref 96–112)
GFR: 103.88 mL/min (ref >60.00)
GLUCOSE: 94 mg/dL (ref 70–99)
Potassium: 3.7 mEq/L (ref 3.5–5.1)
Sodium: 137 mEq/L (ref 135–145)
TOTAL PROTEIN: 7.3 g/dL (ref 6.0–8.3)

## 2015-12-20 LAB — CBC WITH DIFFERENTIAL/PLATELET
BASOS ABS: 0 10*3/uL (ref 0.0–0.1)
Basophils Relative: 0.4 % (ref 0.0–3.0)
Eosinophils Absolute: 0 10*3/uL (ref 0.0–0.7)
Eosinophils Relative: 0.1 % (ref 0.0–5.0)
HEMATOCRIT: 40.9 % (ref 36.0–46.0)
Hemoglobin: 14 g/dL (ref 12.0–15.0)
Lymphocytes Relative: 28.4 % (ref 12.0–46.0)
Lymphs Abs: 1.4 10*3/uL (ref 0.7–4.0)
MCHC: 34.3 g/dL (ref 30.0–36.0)
MCV: 85.8 fl (ref 78.0–100.0)
MONOS PCT: 4.8 % (ref 3.0–12.0)
Monocytes Absolute: 0.2 10*3/uL (ref 0.1–1.0)
NEUTROS ABS: 3.3 10*3/uL (ref 1.4–7.7)
NEUTROS PCT: 66.3 % (ref 43.0–77.0)
Platelets: 257 10*3/uL (ref 150.0–400.0)
RBC: 4.77 Mil/uL (ref 3.87–5.11)
RDW: 12.9 % (ref 11.5–15.5)
WBC: 5 10*3/uL (ref 4.0–10.5)

## 2015-12-20 NOTE — Progress Notes (Signed)
Pre visit review using our clinic review tool, if applicable. No additional management support is needed unless otherwise documented below in the visit note. 

## 2015-12-20 NOTE — Patient Instructions (Signed)
Lindsey [name]  Is doing well .  We will draw the labs that she needs prior to her oral surgery ,  You should be able to access them via Mychart in 24 hours.   Health Maintenance, Female Adopting a healthy lifestyle and getting preventive care can go a long way to promote health and wellness. Talk with your health care provider about what schedule of regular examinations is right for you. This is a good chance for you to check in with your provider about disease prevention and staying healthy. In between checkups, there are plenty of things you can do on your own. Experts have done a lot of research about which lifestyle changes and preventive measures are most likely to keep you healthy. Ask your health care provider for more information. WEIGHT AND DIET  Eat a healthy diet  Be sure to include plenty of vegetables, fruits, low-fat dairy products, and lean protein.  Do not eat a lot of foods high in solid fats, added sugars, or salt.  Get regular exercise. This is one of the most important things you can do for your health.  Most adults should exercise for at least 150 minutes each week. The exercise should increase your heart rate and make you sweat (moderate-intensity exercise).  Most adults should also do strengthening exercises at least twice a week. This is in addition to the moderate-intensity exercise.  Maintain a healthy weight  Body mass index (BMI) is a measurement that can be used to identify possible weight problems. It estimates body fat based on height and weight. Your health care provider can help determine your BMI and help you achieve or maintain a healthy weight.  For females 81 years of age and older:   A BMI below 18.5 is considered underweight.  A BMI of 18.5 to 24.9 is normal.  A BMI of 25 to 29.9 is considered overweight.  A BMI of 30 and above is considered obese.  Watch levels of cholesterol and blood lipids  You should start having your blood tested for lipids  and cholesterol at 28 years of age, then have this test every 5 years.  You may need to have your cholesterol levels checked more often if:  Your lipid or cholesterol levels are high.  You are older than 28 years of age.  You are at high risk for heart disease.  CANCER SCREENING   Lung Cancer  Lung cancer screening is recommended for adults 86-17 years old who are at high risk for lung cancer because of a history of smoking.  A yearly low-dose CT scan of the lungs is recommended for people who:  Currently smoke.  Have quit within the past 15 years.  Have at least a 30-pack-year history of smoking. A pack year is smoking an average of one pack of cigarettes a day for 1 year.  Yearly screening should continue until it has been 15 years since you quit.  Yearly screening should stop if you develop a health problem that would prevent you from having lung cancer treatment.  Breast Cancer  Practice breast self-awareness. This means understanding how your breasts normally appear and feel.  It also means doing regular breast self-exams. Let your health care provider know about any changes, no matter how small.  If you are in your 20s or 30s, you should have a clinical breast exam (CBE) by a health care provider every 1-3 years as part of a regular health exam.  If you are 40 or older,  have a CBE every year. Also consider having a breast X-ray (mammogram) every year.  If you have a family history of breast cancer, talk to your health care provider about genetic screening.  If you are at high risk for breast cancer, talk to your health care provider about having an MRI and a mammogram every year.  Breast cancer gene (BRCA) assessment is recommended for women who have family members with BRCA-related cancers. BRCA-related cancers include:  Breast.  Ovarian.  Tubal.  Peritoneal cancers.  Results of the assessment will determine the need for genetic counseling and BRCA1 and  BRCA2 testing. Cervical Cancer Your health care provider may recommend that you be screened regularly for cancer of the pelvic organs (ovaries, uterus, and vagina). This screening involves a pelvic examination, including checking for microscopic changes to the surface of your cervix (Pap test). You may be encouraged to have this screening done every 3 years, beginning at age 71.  For women ages 43-65, health care providers may recommend pelvic exams and Pap testing every 3 years, or they may recommend the Pap and pelvic exam, combined with testing for human papilloma virus (HPV), every 5 years. Some types of HPV increase your risk of cervical cancer. Testing for HPV may also be done on women of any age with unclear Pap test results.  Other health care providers may not recommend any screening for nonpregnant women who are considered low risk for pelvic cancer and who do not have symptoms. Ask your health care provider if a screening pelvic exam is right for you.  If you have had past treatment for cervical cancer or a condition that could lead to cancer, you need Pap tests and screening for cancer for at least 20 years after your treatment. If Pap tests have been discontinued, your risk factors (such as having a new sexual partner) need to be reassessed to determine if screening should resume. Some women have medical problems that increase the chance of getting cervical cancer. In these cases, your health care provider may recommend more frequent screening and Pap tests. Colorectal Cancer  This type of cancer can be detected and often prevented.  Routine colorectal cancer screening usually begins at 28 years of age and continues through 28 years of age.  Your health care provider may recommend screening at an earlier age if you have risk factors for colon cancer.  Your health care provider may also recommend using home test kits to check for hidden blood in the stool.  A small camera at the end of  a tube can be used to examine your colon directly (sigmoidoscopy or colonoscopy). This is done to check for the earliest forms of colorectal cancer.  Routine screening usually begins at age 14.  Direct examination of the colon should be repeated every 5-10 years through 28 years of age. However, you may need to be screened more often if early forms of precancerous polyps or small growths are found. Skin Cancer  Check your skin from head to toe regularly.  Tell your health care provider about any new moles or changes in moles, especially if there is a change in a mole's shape or color.  Also tell your health care provider if you have a mole that is larger than the size of a pencil eraser.  Always use sunscreen. Apply sunscreen liberally and repeatedly throughout the day.  Protect yourself by wearing long sleeves, pants, a wide-brimmed hat, and sunglasses whenever you are outside. HEART DISEASE, DIABETES, AND  HIGH BLOOD PRESSURE   High blood pressure causes heart disease and increases the risk of stroke. High blood pressure is more likely to develop in:  People who have blood pressure in the high end of the normal range (130-139/85-89 mm Hg).  People who are overweight or obese.  People who are African American.  If you are 3-60 years of age, have your blood pressure checked every 3-5 years. If you are 36 years of age or older, have your blood pressure checked every year. You should have your blood pressure measured twice--once when you are at a hospital or clinic, and once when you are not at a hospital or clinic. Record the average of the two measurements. To check your blood pressure when you are not at a hospital or clinic, you can use:  An automated blood pressure machine at a pharmacy.  A home blood pressure monitor.  If you are between 31 years and 80 years old, ask your health care provider if you should take aspirin to prevent strokes.  Have regular diabetes screenings. This  involves taking a blood sample to check your fasting blood sugar level.  If you are at a normal weight and have a low risk for diabetes, have this test once every three years after 28 years of age.  If you are overweight and have a high risk for diabetes, consider being tested at a younger age or more often. PREVENTING INFECTION  Hepatitis B  If you have a higher risk for hepatitis B, you should be screened for this virus. You are considered at high risk for hepatitis B if:  You were born in a country where hepatitis B is common. Ask your health care provider which countries are considered high risk.  Your parents were born in a high-risk country, and you have not been immunized against hepatitis B (hepatitis B vaccine).  You have HIV or AIDS.  You use needles to inject street drugs.  You live with someone who has hepatitis B.  You have had sex with someone who has hepatitis B.  You get hemodialysis treatment.  You take certain medicines for conditions, including cancer, organ transplantation, and autoimmune conditions. Hepatitis C  Blood testing is recommended for:  Everyone born from [birth year] through 1963.  Anyone with known risk factors for hepatitis C. Sexually transmitted infections (STIs)  You should be screened for sexually transmitted infections (STIs) including gonorrhea and chlamydia if:  You are sexually active and are younger than 28 years of age.  You are older than 28 years of age and your health care provider tells you that you are at risk for this type of infection.  Your sexual activity has changed since you were last screened and you are at an increased risk for chlamydia or gonorrhea. Ask your health care provider if you are at risk.  If you do not have HIV, but are at risk, it may be recommended that you take a prescription medicine daily to prevent HIV infection. This is called pre-exposure prophylaxis (PrEP). You are considered at risk if:  You are  sexually active and do not regularly use condoms or know the HIV status of your partner(s).  You take drugs by injection.  You are sexually active with a partner who has HIV. Talk with your health care provider about whether you are at high risk of being infected with HIV. If you choose to begin PrEP, you should first be tested for HIV. You should then be tested  every 3 months for as long as you are taking PrEP.  PREGNANCY   If you are premenopausal and you may become pregnant, ask your health care provider about preconception counseling.  If you may become pregnant, take 400 to 800 micrograms (mcg) of folic acid every day.  If you want to prevent pregnancy, talk to your health care provider about birth control (contraception). OSTEOPOROSIS AND MENOPAUSE   Osteoporosis is a disease in which the bones lose minerals and strength with aging. This can result in serious bone fractures. Your risk for osteoporosis can be identified using a bone density scan.  If you are 64 years of age or older, or if you are at risk for osteoporosis and fractures, ask your health care provider if you should be screened.  Ask your health care provider whether you should take a calcium or vitamin D supplement to lower your risk for osteoporosis.  Menopause may have certain physical symptoms and risks.  Hormone replacement therapy may reduce some of these symptoms and risks. Talk to your health care provider about whether hormone replacement therapy is right for you.  HOME CARE INSTRUCTIONS   Schedule regular health, dental, and eye exams.  Stay current with your immunizations.   Do not use any tobacco products including cigarettes, chewing tobacco, or electronic cigarettes.  If you are pregnant, do not drink alcohol.  If you are breastfeeding, limit how much and how often you drink alcohol.  Limit alcohol intake to no more than 1 drink per day for nonpregnant women. One drink equals 12 ounces of beer, 5  ounces of wine, or 1 ounces of hard liquor.  Do not use street drugs.  Do not share needles.  Ask your health care provider for help if you need support or information about quitting drugs.  Tell your health care provider if you often feel depressed.  Tell your health care provider if you have ever been abused or do not feel safe at home.   This information is not intended to replace advice given to you by your health care provider. Make sure you discuss any questions you have with your health care provider.   Document Released: 11/06/2010 Document Revised: 05/14/2014 Document Reviewed: 03/25/2013 Elsevier Interactive Patient Education [employer].

## 2015-12-22 ENCOUNTER — Ambulatory Visit (INDEPENDENT_AMBULATORY_CARE_PROVIDER_SITE_OTHER): Payer: 59 | Admitting: Family Medicine

## 2015-12-22 ENCOUNTER — Encounter: Payer: Self-pay | Admitting: Internal Medicine

## 2015-12-22 DIAGNOSIS — H01005 Unspecified blepharitis left lower eyelid: Secondary | ICD-10-CM | POA: Diagnosis not present

## 2015-12-22 MED ORDER — DOXYCYCLINE HYCLATE 100 MG PO TABS: 100.0000 mg | ORAL_TABLET | Freq: Two times a day (BID) | ORAL | 0 refills | Status: DC

## 2015-12-22 NOTE — Progress Notes (Signed)
Patient ID: Lindsey [name], female    DOB: 04-25-1986  Age: 30 y.o. MRN: [id number]  The patient is here for annual wellness examination and management of other chronic and acute problems.   The risk factors are reflected in the social history.  The roster of all physicians providing medical care to patient - is listed in the Snapshot section of the chart.  Activities of daily living:  The patient is independent with regard to  dressing, toileting, and feeding as well as independent mobility, but requires assistance from mether in meal preparation, medication administration and cannot live independently due to cerebral palsy, cognitive dysfunction and seizure disorder.   Home safety : The patient has smoke detectors in the home. They wear seatbelts.  There are no firearms at home. There is no violence in the home.   There is no risks for hepatitis, STDs or HIV. There is no   history of blood transfusion. They have no travel history to infectious disease endemic areas of the world.  The patient has seen their dentist in the last six month. They have seen their eye doctor in the last year. They admit to slight hearing difficulty with regard to whispered voices and some television programs.  They have deferred audiologic testing in the last year.  They do not  have excessive sun exposure. Discussed the need for sun protection: hats, long sleeves and use of sunscreen if there is significant sun exposure.   Diet: the importance of a healthy diet is discussed. They do have a healthy diet.  The benefits of regular aerobic exercise were discussed. She walks 4 times per week ,  20 minutes.   Depression screen: there are no signs or vegative symptoms of depression- irritability, change in appetite, anhedonia, sadness/tearfullness.  Cognitive assessment: the patient is unable to manage her  financial and personal affairs  They could not relate day,date,year and events.   The following portions of the  patient's history were reviewed and updated as appropriate: allergies, current medications, past family history, past medical history,  past surgical history, past social history  and problem list.  Visual acuity was not assessed per patient preference since she has regular follow up with her ophthalmologist. Hearing and body mass index were assessed and reviewed.   During the course of the visit the patient and her mother  was educated and counseled about appropriate screening and preventive services including : fall prevention , diabetes screening, nutrition counseling, colorectal cancer screening, and recommended immunizations.    CC: The primary encounter diagnosis was Long-term use of high-risk medication. A diagnosis of Routine general medical examination at a health care facility was also pertinent to this visit.  History Lindsey [name] has a past medical history of Epilepsy ([place]) (age 49); Insomnia; Status post VNS (vagus nerve stimulator) placement (2000); and Temporal lobectomy behavior syndrome (1994).   She has a past surgical history that includes Tonsillectomy and adenoidectomy (age 68); Abdominal hysterectomy (2005); Brain surgery; and Craniotomy for temporal lobectomy.   Her family history is not on file.She reports that she has never smoked. She has never used smokeless tobacco. She reports that she does not drink alcohol or use drugs.  Outpatient Medications Prior to Visit  Medication Sig Dispense Refill  . diazepam (DIASTAT ACUDIAL) 10 MG GEL Place 20 mg rectally once.    . diazepam (VALIUM) 5 MG tablet TAKE 1 AND 1/2 TABLET AT BEDTIME AS NEEDED 45 tablet 5  . LamoTRIgine 300 MG TB24 Take 1  tablet by mouth daily.    . pramipexole (MIRAPEX) 0.25 MG tablet Take 1 tablet (0.25 mg total) by mouth 3 (three) times daily. 90 tablet 3  . QUEtiapine (SEROQUEL) 200 MG tablet TAKE ONE TABLET AT BEDTIME 90 tablet 1   No facility-administered medications prior to visit.     Review of Systems    Patient denies headache, fevers, malaise, unintentional weight loss, skin rash, eye pain, sinus congestion and sinus pain, sore throat, dysphagia,  hemoptysis , cough, dyspnea, wheezing, chest pain, palpitations, orthopnea, edema, abdominal pain, nausea, melena, diarrhea, constipation, flank pain, dysuria, hematuria, urinary  Frequency, nocturia, numbness, tingling,   Focal weakness, Loss of consciousness,  Tremor, insomnia, depression, anxiety, and suicidal ideation.      Objective:  BP 120/78 (BP Location: Left Arm, Patient Position: Sitting, Cuff Size: Normal)   Pulse (!) 101   Temp 97.5 F (36.4 C) (Oral)   Resp 18   Wt 146 lb 2 oz (66.3 kg)   LMP 01/09/2011   SpO2 97%   BMI 23.59 kg/m   Physical Exam   General appearance: alert, marginally cooperative and appears younger than tated age Ears: normal TM's and external ear canals both ears Throat: lips, mucosa, and tongue normal; teeth and gums normal Neck: no adenopathy, no carotid bruit, supple, symmetrical, trachea midline and thyroid not enlarged, symmetric, no tenderness/mass/nodules Back: symmetric, no curvature. ROM normal. No CVA tenderness. Lungs: clear to auscultation bilaterally Heart: regular rate and rhythm, S1, S2 normal, no murmur, click, rub or gallop Abdomen: soft, non-tender; bowel sounds normal; no masses,  no organomegaly Pulses: 2+ and symmetric Skin: Skin color, texture, turgor normal. Xerosis noted without rashes or lesions Lymph nodes: Cervical, supraclavicular, and axillary nodes normal.    Assessment & Plan:   Problem List Items Addressed This Visit    Routine general medical examination at a health care facility    Annual comprehensive preventive exam was done as well as an evaluation and management of chronic conditions .  During the course of the visit the patient's mother was educated and counseled about appropriate screening and preventive services including :  diabetes screening, lipid  analysis with projected  10 year  risk for CAD , nutrition counseling,  and recommended immunizations.  Printed recommendations for health maintenance screenings was given      Long-term use of high-risk medication - Primary   Relevant Orders   Comp Met (CMET) (Completed)   CBC with Differential/Platelet (Completed)    Other Visit Diagnoses   None.     I am having Ms. Keshishyan maintain her diazepam, QUEtiapine, LamoTRIgine, diazepam, and pramipexole.  No orders of the defined types were placed in this encounter.   There are no discontinued medications.  Follow-up: No Follow-up on file.   Crecencio Mc, MD

## 2015-12-22 NOTE — Progress Notes (Signed)
   Subjective:  Patient ID: Lindsey [name], female    DOB: 1986-03-12  Age: 30 y.o. MRN: [id number]  CC: L lower eyelid swelling, redness  HPI:  30 year old female with cerebral palsy presents with the above issue. Mother is primary historian given patient's medical issues.  Mother states that yesterday she developed left lower eyelid swelling/redness. She's had some associated drainage. No eye redness. No known inciting factor. No exacerbating or relieving factors. Patient has had this previously. She does not tolerate warm compresses or eyedrops. No reports of vision changes. No other complaints today.   Social Hx   Social History   Social History  . Marital status: Single    Spouse name: N/A  . Number of children: N/A  . Years of education: N/A   Social History Main Topics  . Smoking status: Never Smoker  . Smokeless tobacco: Never Used  . Alcohol use No  . Drug use: No  . Sexual activity: Not on file   Other Topics Concern  . Not on file   Social History Narrative  . No narrative on file   Review of Systems  Constitutional: Negative.   Eyes: Positive for discharge. Negative for visual disturbance.       Eyelid redness.   Objective:  BP 121/77 (BP Location: Left Arm, Patient Position: Sitting, Cuff Size: Normal)   Pulse (!) 114   Temp (!) 96.7 F (35.9 C) (Oral)   Wt 144 lb 2 oz (65.4 kg)   LMP 01/09/2011   SpO2 99%   BMI 23.26 kg/m   BP/Weight 12/22/2015 12/20/2015 11/10/2015  Systolic BP 121 120 114  Diastolic BP 77 78 72  Wt. (Lbs) 144.13 146.13 154  BMI 23.26 23.59 24.87   Physical Exam  Constitutional: She appears well-developed. No distress.  HENT:  Head: Normocephalic and atraumatic.  Eyes:  Left eye - lower medial eyelid redness. No conjunctival injection.   Neck: Normal range of motion.  Pulmonary/Chest: Effort normal.  Neurological: She is alert.  Vitals reviewed.  Lab Results  Component Value Date   WBC 5.0 12/20/2015   HGB 14.0  12/20/2015   HCT 40.9 12/20/2015   PLT 257.0 12/20/2015   GLUCOSE 94 12/20/2015   ALT 17 12/20/2015   AST 21 12/20/2015   NA 137 12/20/2015   K 3.7 12/20/2015   CL 103 12/20/2015   CREATININE 0.71 12/20/2015   BUN 8 12/20/2015   CO2 25 12/20/2015   TSH 2.48 10/05/2014   Assessment & Plan:   Problem List Items Addressed This Visit    Blepharitis of left lower eyelid    History of. Acute recurrence. Given inability to tolerate compresses and eye drops treating with Doxycycline.       Other Visit Diagnoses   None.    Meds ordered this encounter  Medications  . doxycycline (VIBRA-TABS) 100 MG tablet    Sig: Take 1 tablet (100 mg total) by mouth 2 (two) times daily.    Dispense:  20 tablet    Refill:  0   Follow-up: PRN  Everlene OtherJayce Cohen Doleman DO [facility] [place]

## 2015-12-22 NOTE — Progress Notes (Signed)
Pre visit review using our clinic review tool, if applicable. No additional management support is needed unless otherwise documented below in the visit note. 

## 2015-12-22 NOTE — Assessment & Plan Note (Signed)
History of. Acute recurrence. Given inability to tolerate compresses and eye drops treating with Doxycycline.

## 2015-12-22 NOTE — Patient Instructions (Signed)
Start the antibiotic if needed.  I would hold off for a day or so as this will likely improve.  Take care  Dr. Adriana Simasook

## 2016-01-30 ENCOUNTER — Encounter: Payer: Self-pay | Admitting: Family Medicine

## 2016-01-30 ENCOUNTER — Ambulatory Visit (INDEPENDENT_AMBULATORY_CARE_PROVIDER_SITE_OTHER): Payer: 59 | Admitting: Family Medicine

## 2016-01-30 VITALS — BP 110/70 | HR 84 | Temp 98.6°F | Wt 144.8 lb

## 2016-01-30 DIAGNOSIS — R21 Rash and other nonspecific skin eruption: Secondary | ICD-10-CM | POA: Diagnosis not present

## 2016-01-30 DIAGNOSIS — G40909 Epilepsy, unspecified, not intractable, without status epilepticus: Secondary | ICD-10-CM

## 2016-01-30 MED ORDER — TRIAMCINOLONE ACETONIDE 0.1 % EX CREA: 1.0000 "application " | TOPICAL_CREAM | Freq: Two times a day (BID) | CUTANEOUS | 0 refills | Status: DC

## 2016-01-30 NOTE — Progress Notes (Signed)
Marikay Alar, MD Phone: [phone]  Lindsey [name] is a 30 y.o. female who presents today for same-day visit.  Patient's mother notes onset of right rash on right hip. Noticed it this morning. Tried hydrocortisone cream. No itching. It's reddish purplish in hue. No new soaps or detergents. No new medications. No tick exposures. No contacts with this. Has never had this before. No fevers or chills.  Patient's mother additionally notes she had a seizure this morning. She has a known seizure disorder and has 1 to 2 seizures per month. She is currently on Lamictal for this and they have been under good control recently. Mom notes it was a minor seizure for her with minimal shaking. She gave her Diastat and the seizure lasted 1-2 minutes. Came out of the seizure easily. Has been doing well since then. Follows with neurology at [facility].  PMH: nonsmoker.   ROS see history of present illness  Objective  Physical Exam Vitals:   01/30/16 1023  BP: 110/70  Pulse: 84  Temp: 98.6 F (37 C)    BP Readings from Last 3 Encounters:  01/30/16 110/70  12/22/15 121/77  12/20/15 120/78   Wt Readings from Last 3 Encounters:  01/30/16 144 lb 12.8 oz (65.7 kg)  12/22/15 144 lb 2 oz (65.4 kg)  12/20/15 146 lb 2 oz (66.3 kg)    Physical Exam  Constitutional: No distress.  HENT:  Head: Normocephalic and atraumatic.  Cardiovascular: Normal rate, regular rhythm and normal heart sounds.   Pulmonary/Chest: Effort normal and breath sounds normal.  Neurological: She is alert.  Patient's cooperation with the neurological exam was limited given her cognitive disability, able to smile and speak with no difficulty, she is alert, makes eye contact, did not cooperate with the rest of the cranial nerve exam, 5 out of 5 strength bilateral grip, biceps, triceps, quads, plantar flexion, and dorsiflexion, able stand on her own from a seated position  Skin: She is not diaphoretic.  Rash on right hip with  reddish purplish papules some of which blanch to some degree and others which does blanch, there is no excoriations, there is no surrounding erythema, there is no tenderness, the area of these encompasses about 5-6 cm in diameter     Assessment/Plan: Please see individual problem list.  Seizure disorder, primary Appears stable per patient's mother's report. Appears to be at baseline at this time following seizure this morning. She'll continue her current medications and to follow with neurology. Advised that if she has recurrent or more frequent seizures she should be reevaluated.  Rash and nonspecific skin eruption Somewhat nonspecific rash though with some of them not blanching could be representative of a cutaneous vasculitis. Potentially could be a contact dermatitis or allergic dermatitis as well. We'll trial a topical steroid. I discussed obtaining lab work given potential for vasculitis though they opted to defer at this time. Most recent CMP and CBC was reviewed. We'll refer to dermatology for consideration of biopsy. Given return precautions.   Orders Placed This Encounter  Procedures  . Ambulatory referral to Dermatology    Referral Priority:   Routine    Referral Type:   Consultation    Referral Reason:   Specialty Services Required    Requested Specialty:   Dermatology    Number of Visits Requested:   1    Meds ordered this encounter  Medications  . triamcinolone cream (KENALOG) 0.1 %    Sig: Apply 1 application topically 2 (two) times daily.  Dispense:  30 g    Refill:  0    Marikay AlarEric Odena Mcquaid, MD [facility]

## 2016-01-30 NOTE — Patient Instructions (Signed)
Nice to see you. We are going to refer you to dermatology for a rash. We are going to try a topical steroid called triamcinolone as well. If you develop spreading rash, fevers, chills, recurrent seizures, or any new or changing symptoms please seek medical attention.

## 2016-01-30 NOTE — Progress Notes (Signed)
Pre visit review using our clinic review tool, if applicable. No additional management support is needed unless otherwise documented below in the visit note. 

## 2016-01-30 NOTE — Assessment & Plan Note (Signed)
Somewhat nonspecific rash though with some of them not blanching could be representative of a cutaneous vasculitis. Potentially could be a contact dermatitis or allergic dermatitis as well. We'll trial a topical steroid. I discussed obtaining lab work given potential for vasculitis though they opted to defer at this time. Most recent CMP and CBC was reviewed. We'll refer to dermatology for consideration of biopsy. Given return precautions.

## 2016-01-30 NOTE — Assessment & Plan Note (Signed)
Appears stable per patient's mother's report. Appears to be at baseline at this time following seizure this morning. She'll continue her current medications and to follow with neurology. Advised that if she has recurrent or more frequent seizures she should be reevaluated.

## 2016-02-06 ENCOUNTER — Other Ambulatory Visit: Payer: Self-pay | Admitting: Internal Medicine

## 2016-03-05 ENCOUNTER — Ambulatory Visit (INDEPENDENT_AMBULATORY_CARE_PROVIDER_SITE_OTHER): Payer: 59 | Admitting: Family

## 2016-03-05 ENCOUNTER — Encounter: Payer: Self-pay | Admitting: Family

## 2016-03-05 VITALS — BP 102/68 | HR 91 | Temp 97.2°F | Wt 140.8 lb

## 2016-03-05 DIAGNOSIS — J020 Streptococcal pharyngitis: Secondary | ICD-10-CM

## 2016-03-05 LAB — POCT RAPID STREP A (OFFICE): Rapid Strep A Screen: POSITIVE — AB

## 2016-03-05 MED ORDER — AMOXICILLIN 500 MG PO TABS: ORAL_TABLET | ORAL | 0 refills | Status: DC

## 2016-03-05 NOTE — Progress Notes (Signed)
Subjective:    Patient ID: Lindsey [name] E Hazard, female    DOB: 13-Oct-1985, 30 y.o.   MRN: [id number]  CC: Lindsey PalmsDanielle E Duffey is a 30 y.o. female who presents today for an acute visit.    HPI: Patient here for acute visit with chief complaint of sore throat which started yesterday morning.  She is accompanied with mother. Endorses nasal congestion, dry cough. No fever, chills, SOB, wheezing.Eating and drinking well.    She is a complex history of cerebral palsy and seizure disorder since [facility]   She has an upcoming surgery with dental surgery, and they wanted to ensure that 'nothing was going on with her throat'.    HISTORY:  Past Medical History:  Diagnosis Date  . Epilepsy [facility](HCC) age 73   negative metabolic workup has brain abnormalities by MRI (Dr. Quintin Altoadtke, [facility])  . Insomnia   . Status post VNS (vagus nerve stimulator) placement 2000  . Temporal lobectomy behavior syndrome 1994   Right   Past Surgical History:  Procedure Laterality Date  . ABDOMINAL HYSTERECTOMY  2005  . BRAIN SURGERY     Temporal Lobe resection  . CRANIOTOMY FOR TEMPORAL LOBECTOMY    . TONSILLECTOMY AND ADENOIDECTOMY  age 30   No family history on file.  Allergies: Influenza virus vaccine split; Benadryl [diphenhydramine hcl]; and Zyrtec [cetirizine hcl] Current Outpatient Prescriptions on File Prior to Visit  Medication Sig Dispense Refill  . diazepam (DIASTAT ACUDIAL) 10 MG GEL Place 20 mg rectally once.    . diazepam (VALIUM) 5 MG tablet TAKE 1 AND 1/2 TABLET AT BEDTIME AS NEEDED 45 tablet 5  . LamoTRIgine 300 MG TB24 Take 1 tablet by mouth daily.    . pramipexole (MIRAPEX) 0.25 MG tablet Take 1 tablet (0.25 mg total) by mouth 3 (three) times daily. 90 tablet 3  . QUEtiapine (SEROQUEL) 200 MG tablet TAKE ONE TABLET BY MOUTH AT BEDTIME 90 tablet 1  . triamcinolone cream (KENALOG) 0.1 % Apply 1 application topically 2 (two) times daily. 30 g 0   No current facility-administered  medications on file prior to visit.     Social History  Substance Use Topics  . Smoking status: Never Smoker  . Smokeless tobacco: Never Used  . Alcohol use No    Review of Systems  Constitutional: Negative for chills and fever.  HENT: Positive for congestion and sore throat. Negative for ear pain, sinus pressure, trouble swallowing and voice change.   Respiratory: Positive for cough. Negative for shortness of breath and wheezing.   Cardiovascular: Negative for chest pain and palpitations.  Gastrointestinal: Negative for nausea and vomiting.      Objective:    BP 102/68   Pulse 91   Temp 97.2 F (36.2 C) (Axillary)   Wt 140 lb 12.8 oz (63.9 kg)   LMP 01/09/2011   SpO2 96%   BMI 22.73 kg/m    Physical Exam  Constitutional: She appears well-developed and well-nourished.  HENT:  Head: Normocephalic and atraumatic.  Right Ear: Hearing, tympanic membrane, external ear and ear canal normal. No drainage, swelling or tenderness. No foreign bodies. Tympanic membrane is not erythematous and not bulging. No middle ear effusion. No decreased hearing is noted.  Left Ear: Hearing, tympanic membrane, external ear and ear canal normal. No drainage, swelling or tenderness. No foreign bodies. Tympanic membrane is not erythematous and not bulging.  No middle ear effusion. No decreased hearing is noted.  Nose: Nose normal. No rhinorrhea. Right  sinus exhibits no maxillary sinus tenderness and no frontal sinus tenderness. Left sinus exhibits no maxillary sinus tenderness and no frontal sinus tenderness.  Mouth/Throat: Uvula is midline, oropharynx is clear and moist and mucous membranes are normal. No oropharyngeal exudate, posterior oropharyngeal edema, posterior oropharyngeal erythema or tonsillar abscesses.  Eyes: Conjunctivae are normal.  Cardiovascular: Regular rhythm, normal heart sounds and normal pulses.   Pulmonary/Chest: Effort normal and breath sounds normal. She has no wheezes. She has  no rhonchi. She has no rales.  Lymphadenopathy:       Head (right side): No submental, no submandibular, no tonsillar, no preauricular, no posterior auricular and no occipital adenopathy present.       Head (left side): No submental, no submandibular, no tonsillar, no preauricular, no posterior auricular and no occipital adenopathy present.    She has no cervical adenopathy.  Neurological: She is alert.  Skin: Skin is warm and dry.  Psychiatric: She has a normal mood and affect. Her speech is normal and behavior is normal. Thought content normal.  Vitals reviewed.      Assessment & Plan:   1. Strep pharyngitis Rapid strep positive. Will treat with amoxicillin. Return precautions given.   - amoxicillin (AMOXIL) 500 MG tablet; Take two tablets ( total 1000 mg) PO q24h for 10 days  Dispense: 20 tablet; Refill: 0   I am having Ms. Hineman start on amoxicillin. I am also having her maintain her diazepam, LamoTRIgine, diazepam, pramipexole, triamcinolone cream, and QUEtiapine.   Meds ordered this encounter  Medications  . amoxicillin (AMOXIL) 500 MG tablet    Sig: Take two tablets ( total 1000 mg) PO q24h for 10 days    Dispense:  20 tablet    Refill:  0    Order Specific Question:   Supervising Provider    Answer:   Sherlene ShamsULLO, TERESA L [[id number]]    Return precautions given.   Risks, benefits, and alternatives of the medications and treatment plan prescribed today were discussed, and patient expressed understanding.   Education regarding symptom management and diagnosis given to patient on AVS.  Continue to follow with TULLO, Mar DaringERESA L, MD for routine health maintenance.   Lindsey [name] and I agreed with plan.   Lindsey PlowmanMargaret Arnett, FNP

## 2016-03-05 NOTE — Patient Instructions (Signed)
If there is no improvement in your symptoms, or if there is any worsening of symptoms, or if you have any additional concerns, please return for re-evaluation; or, if we are closed, consider going to the Emergency Room for evaluation if symptoms urgent.    Strep Throat Strep throat is a bacterial infection of the throat. Your health care provider may call the infection tonsillitis or pharyngitis, depending on whether there is swelling in the tonsils or at the back of the throat. Strep throat is most common during the cold months of the year in children who are [id number] years of age, but it can happen during any season in people of any age. This infection is spread from person to person (contagious) through coughing, sneezing, or close contact. CAUSES Strep throat is caused by the bacteria called Streptococcus pyogenes. RISK FACTORS This condition is more likely to develop in:  People who spend time in crowded places where the infection can spread easily.  People who have close contact with someone who has strep throat. SYMPTOMS Symptoms of this condition include:  Fever or chills.   Redness, swelling, or pain in the tonsils or throat.  Pain or difficulty when swallowing.  White or yellow spots on the tonsils or throat.  Swollen, tender glands in the neck or under the jaw.  Red rash all over the body (rare). DIAGNOSIS This condition is diagnosed by performing a rapid strep test or by taking a swab of your throat (throat culture test). Results from a rapid strep test are usually ready in a few minutes, but throat culture test results are available after one or two days. TREATMENT This condition is treated with antibiotic medicine. HOME CARE INSTRUCTIONS Medicines  Take over-the-counter and prescription medicines only as told by your health care provider.  Take your antibiotic as told by your health care provider. Do not stop taking the antibiotic even if you start to feel  better.  Have family members who also have a sore throat or fever tested for strep throat. They may need antibiotics if they have the strep infection. Eating and Drinking  Do not share food, drinking cups, or personal items that could cause the infection to spread to other people.  If swallowing is difficult, try eating soft foods until your sore throat feels better.  Drink enough fluid to keep your urine clear or pale yellow. General Instructions  Gargle with a salt-water mixture 3-4 times per day or as needed. To make a salt-water mixture, completely dissolve -1 tsp of salt in 1 cup of warm water.  Make sure that all household members wash their hands well.  Get plenty of rest.  Stay home from school or work until you have been taking antibiotics for 24 hours.  Keep all follow-up visits as told by your health care provider. This is important. SEEK MEDICAL CARE IF:  The glands in your neck continue to get bigger.  You develop a rash, cough, or earache.  You cough up a thick liquid that is green, yellow-brown, or bloody.  You have pain or discomfort that does not get better with medicine.  Your problems seem to be getting worse rather than better.  You have a fever. SEEK IMMEDIATE MEDICAL CARE IF:  You have new symptoms, such as vomiting, severe headache, stiff or painful neck, chest pain, or shortness of breath.  You have severe throat pain, drooling, or changes in your voice.  You have swelling of the neck, or the skin on the  neck becomes red and tender.  You have signs of dehydration, such as fatigue, dry mouth, and decreased urination.  You become increasingly sleepy, or you cannot wake up completely.  Your joints become red or painful.   This information is not intended to replace advice given to you by your health care provider. Make sure you discuss any questions you have with your health care provider.   Document Released: 04/20/2000 Document Revised:  01/12/2015 Document Reviewed: 08/16/2014 Elsevier Interactive Patient Education [employer]2016 Elsevier Inc.

## 2016-03-05 NOTE — Progress Notes (Signed)
Pre visit review using our clinic review tool, if applicable. No additional management support is needed unless otherwise documented below in the visit note. 

## 2016-04-17 ENCOUNTER — Ambulatory Visit: Payer: 59 | Admitting: Internal Medicine

## 2016-04-17 ENCOUNTER — Telehealth: Payer: Self-pay | Admitting: Internal Medicine

## 2016-04-17 NOTE — Telephone Encounter (Signed)
Pt's mother dropped off a handicapp renewal form. Form is located up front in color folder.

## 2016-04-17 NOTE — Telephone Encounter (Signed)
SIGNED AND RETURNED

## 2016-04-17 NOTE — Telephone Encounter (Signed)
In quick sign just needs signature. FYI

## 2016-04-19 NOTE — Telephone Encounter (Signed)
Mailed

## 2016-05-08 ENCOUNTER — Encounter: Payer: Self-pay | Admitting: Family

## 2016-05-08 ENCOUNTER — Other Ambulatory Visit: Payer: Self-pay | Admitting: Internal Medicine

## 2016-05-08 ENCOUNTER — Ambulatory Visit (INDEPENDENT_AMBULATORY_CARE_PROVIDER_SITE_OTHER): Payer: 59 | Admitting: Family

## 2016-05-08 VITALS — BP 108/60 | HR 98 | Temp 98.7°F | Wt 131.0 lb

## 2016-05-08 DIAGNOSIS — J209 Acute bronchitis, unspecified: Secondary | ICD-10-CM | POA: Diagnosis not present

## 2016-05-08 DIAGNOSIS — J029 Acute pharyngitis, unspecified: Secondary | ICD-10-CM

## 2016-05-08 LAB — POCT RAPID STREP A (OFFICE): Rapid Strep A Screen: NEGATIVE

## 2016-05-08 NOTE — Progress Notes (Signed)
Subjective:    Patient ID: Lindsey [name], female    DOB: 12/27/1985, 32 y.o.   MRN: [id number]  CC: Lindsey [name] is a 31 y.o. female who presents today for an acute visit.    HPI: CC: dry cough, clear runny congestion x 4 days, unchanged. Endorses sore throat.  Tried mucinex severe cold with  improvement. Per mom, felt tactile warmth.   No chills, SOB, wheezing.     HISTORY:  Past Medical History:  Diagnosis Date  . Epilepsy [facility]) age 42   negative metabolic workup has brain abnormalities by MRI (Dr. Quintin Alto, [facility])  . Insomnia   . Status post VNS (vagus nerve stimulator) placement 2000  . Temporal lobectomy behavior syndrome 1994   Right   Past Surgical History:  Procedure Laterality Date  . ABDOMINAL HYSTERECTOMY  2005  . BRAIN SURGERY     Temporal Lobe resection  . CRANIOTOMY FOR TEMPORAL LOBECTOMY    . TONSILLECTOMY AND ADENOIDECTOMY  age 67   No family history on file.  Allergies: Influenza virus vaccine split; Benadryl [diphenhydramine hcl]; and Zyrtec [cetirizine hcl] Current Outpatient Prescriptions on File Prior to Visit  Medication Sig Dispense Refill  . diazepam (DIASTAT ACUDIAL) 10 MG GEL Place 20 mg rectally once.    . diazepam (VALIUM) 5 MG tablet TAKE 1 AND 1/2 TABLET AT BEDTIME AS NEEDED 45 tablet 5  . LamoTRIgine 300 MG TB24 Take 1 tablet by mouth daily.    . pramipexole (MIRAPEX) 0.25 MG tablet Take 1 tablet (0.25 mg total) by mouth 3 (three) times daily. 90 tablet 3  . QUEtiapine (SEROQUEL) 200 MG tablet TAKE ONE TABLET BY MOUTH AT BEDTIME 90 tablet 1  . triamcinolone cream (KENALOG) 0.1 % Apply 1 application topically 2 (two) times daily. 30 g 0  . amoxicillin (AMOXIL) 500 MG tablet Take two tablets ( total 1000 mg) PO q24h for 10 days (Patient not taking: Reported on 05/08/2016) 20 tablet 0   No current facility-administered medications on file prior to visit.     Social History  Substance Use Topics  . Smoking status: Never Smoker  .  Smokeless tobacco: Never Used  . Alcohol use No    Review of Systems  Constitutional: Negative for chills and fever.  HENT: Positive for congestion, rhinorrhea and sore throat. Negative for sinus pain and sinus pressure.   Respiratory: Positive for cough. Negative for shortness of breath and wheezing.   Cardiovascular: Negative for chest pain and palpitations.  Gastrointestinal: Negative for nausea and vomiting.      Objective:    BP 108/60   Pulse 98   Temp 98.7 F (37.1 C) (Axillary)   Wt 131 lb (59.4 kg)   LMP 01/09/2011   SpO2 98%   BMI 21.14 kg/m    Physical Exam  Constitutional: She appears well-developed and well-nourished.  HENT:  Head: Normocephalic and atraumatic.  Right Ear: Hearing, tympanic membrane, external ear and ear canal normal. No drainage, swelling or tenderness. No foreign bodies. Tympanic membrane is not erythematous and not bulging. No middle ear effusion. No decreased hearing is noted.  Left Ear: Hearing, tympanic membrane, external ear and ear canal normal. No drainage, swelling or tenderness. No foreign bodies. Tympanic membrane is not erythematous and not bulging.  No middle ear effusion. No decreased hearing is noted.  Nose: Rhinorrhea present. Right sinus exhibits no maxillary sinus tenderness and no frontal sinus tenderness. Left sinus exhibits no maxillary sinus tenderness and no frontal sinus tenderness.  Mouth/Throat: Uvula is midline, oropharynx is clear and moist and mucous membranes are normal. No oropharyngeal exudate, posterior oropharyngeal edema, posterior oropharyngeal erythema or tonsillar abscesses.  Eyes: Conjunctivae are normal.  Cardiovascular: Regular rhythm, normal heart sounds and normal pulses.   Pulmonary/Chest: Effort normal and breath sounds normal. She has no wheezes. She has no rhonchi. She has no rales.  Lymphadenopathy:       Head (right side): No submental, no submandibular, no tonsillar, no preauricular, no posterior  auricular and no occipital adenopathy present.       Head (left side): No submental, no submandibular, no tonsillar, no preauricular, no posterior auricular and no occipital adenopathy present.    She has no cervical adenopathy.  Neurological: She is alert.  Skin: Skin is warm and dry.  Psychiatric: She has a normal mood and affect. Her speech is normal and behavior is normal. Thought content normal.  Vitals reviewed.      Assessment & Plan:   1. Sore throat Neg strep. Suspect viral. Advised conservative therapy.  - POCT rapid strep A  2. Acute bronchitis, unspecified organism Duration 4 days. Afebrile. No adventitious lung sounds. Mother and I jointly agreed we will treat conservatively and delay antibiotics. Encouraged to stay on mucinex and continue to closely monitor.      I am having Lindsey [name] maintain her diazepam, LamoTRIgine, diazepam, pramipexole, triamcinolone cream, QUEtiapine, amoxicillin, and dextromethorphan-guaiFENesin.   Meds ordered this encounter  Medications  . dextromethorphan-guaiFENesin (MUCINEX DM) 30-600 MG 12hr tablet    Sig: Take 1 tablet by mouth 2 (two) times daily.    Return precautions given.   Risks, benefits, and alternatives of the medications and treatment plan prescribed today were discussed, and patient expressed understanding.   Education regarding symptom management and diagnosis given to patient on AVS.  Continue to follow with TULLO, Mar DaringERESA L, MD for routine health maintenance.   Lindsey [name] and I agreed with plan.   Lindsey PlowmanMargaret Milca Sytsma, FNP

## 2016-05-08 NOTE — Progress Notes (Signed)
Pre visit review using our clinic review tool, if applicable. No additional management support is needed unless otherwise documented below in the visit note. 

## 2016-05-08 NOTE — Patient Instructions (Signed)
Suspect viral respiratory infection  continue conservative treatment and let me know if she doesn't improve  If there is no improvement in your symptoms, or if there is any worsening of symptoms, or if you have any additional concerns, please return for re-evaluation; or, if we are closed, consider going to the Emergency Room for evaluation if symptoms urgent.

## 2016-05-09 ENCOUNTER — Ambulatory Visit: Payer: Self-pay | Admitting: Internal Medicine

## 2016-05-11 ENCOUNTER — Telehealth: Payer: Self-pay

## 2016-05-11 NOTE — Telephone Encounter (Signed)
Diazepam 5 mg [facility]ed [facility]

## 2016-06-04 ENCOUNTER — Encounter: Payer: Self-pay | Admitting: Internal Medicine

## 2016-06-04 ENCOUNTER — Ambulatory Visit (INDEPENDENT_AMBULATORY_CARE_PROVIDER_SITE_OTHER): Payer: 59 | Admitting: Internal Medicine

## 2016-06-04 ENCOUNTER — Telehealth: Payer: Self-pay

## 2016-06-04 DIAGNOSIS — K5900 Constipation, unspecified: Secondary | ICD-10-CM

## 2016-06-04 DIAGNOSIS — R451 Restlessness and agitation: Secondary | ICD-10-CM

## 2016-06-04 MED ORDER — QUETIAPINE FUMARATE 50 MG PO TABS: ORAL_TABLET | ORAL | 0 refills | Status: DC

## 2016-06-04 NOTE — Telephone Encounter (Signed)
50 MG TALBET SENT TO PHARMACY WITH INSTRUCTIONS  2 TABLETS DAILY X 1 WEEK 1 TABLET DAILY X 1 WEEK  THEN STOP

## 2016-06-04 NOTE — Patient Instructions (Addendum)
We need to gradually reduce the seroquel dose,  DO Not stop it cold [place]!  Cut THE seroquel in half for one week, , then Use half tablet every other day for one week , then stop   If she needs a more gradual taper due to increased agitation,  Let me know and I will call in a 50 mg tablet that we can use   DO NOT REDUCE THE VALIUM UNTIL WE HAVE COMPLETELY STOPPED THE SEROQUEL   Ok to stop the  vitamin d .  Use diet to get her vitamin d 1000 iUS DAILY

## 2016-06-04 NOTE — Telephone Encounter (Signed)
Called pharmacy script received they will given information to patient.

## 2016-06-04 NOTE — Progress Notes (Signed)
Subjective:  Patient ID: Lindsey [name], female    DOB: 10/14/1985  Age: 31 y.o. MRN: [id number]  CC: Diagnoses of Restlessness and agitation and Constipation, unspecified constipation type were pertinent to this visit.  HPI IZELA ALTIER presents for medication management of nocturnal agitation, new onset constipation and new onset right ear pain that started today. accompaneid by mother.   Patient has been taking 200 mg seroquel and 7.5 mg diazepam at night for several months due to persistent nocturnal agitation. She has been sleeping well on current regimen,  But has become persistently constipated and mother  is requesting discontinuation of both medications.  Patient also began complaining of right ear bothering her this morning. patietn is difficult historian due to condition of cerebral palsy but denies pain, and mother reports no history of symptoms suggesting a  recent viral illness     Outpatient Medications Prior to Visit  Medication Sig Dispense Refill  . diazepam (DIASTAT ACUDIAL) 10 MG GEL Place 20 mg rectally once.    . diazepam (VALIUM) 5 MG tablet TAKE 1 AND 1/2 TABLET AT BEDTIME AS NEEDED 45 tablet 3  . LamoTRIgine 300 MG TB24 Take 1 tablet by mouth 2 (two) times daily.     . pramipexole (MIRAPEX) 0.25 MG tablet TAKE ONE TABLET BY MOUTH 3 TIMES DAILY 90 tablet 1  . amoxicillin (AMOXIL) 500 MG tablet Take two tablets ( total 1000 mg) PO q24h for 10 days 20 tablet 0  . QUEtiapine (SEROQUEL) 200 MG tablet TAKE ONE TABLET BY MOUTH AT BEDTIME 90 tablet 1  . dextromethorphan-guaiFENesin (MUCINEX DM) 30-600 MG 12hr tablet Take 1 tablet by mouth 2 (two) times daily.    Marland Kitchen triamcinolone cream (KENALOG) 0.1 % Apply 1 application topically 2 (two) times daily. (Patient not taking: Reported on 06/04/2016) 30 g 0   No facility-administered medications prior to visit.     Review of Systems;  Per mother, there have been no complaints of  headache, fevers, malaise,  unintentional weight loss, skin rash, eye pain, sinus congestion and sinus pain, sore throat, dysphagia,  hemoptysis , cough, dyspnea, wheezing, chest pain, palpitations, orthopnea, edema, abdominal pain, nausea, melena, diarrhea,  flank pain, dysuria, hematuria, urinary  Frequency, nocturia, numbness, tingling, seizures,  Focal weakness, Loss of consciousness,  Tremor, insomnia, depression, anxiety, and suicidal ideation.      Objective:  BP 110/68   Pulse 90   Temp 97.4 F (36.3 C) (Oral)   Resp 16   Wt 129 lb (58.5 kg)   LMP 01/09/2011   SpO2 92%   BMI 20.82 kg/m   BP Readings from Last 3 Encounters:  06/04/16 110/68  05/08/16 108/60  03/05/16 102/68    Wt Readings from Last 3 Encounters:  06/04/16 129 lb (58.5 kg)  05/08/16 131 lb (59.4 kg)  03/05/16 140 lb 12.8 oz (63.9 kg)    General appearance: alert, cooperative and appears younger than stated age Ears: normal TM's and external ear canals both ears, some cerumen nut not impacted.  Throat: lips, mucosa, and tongue normal; teeth and gums normal Neck: no adenopathy, no carotid bruit, supple, symmetrical, trachea midline and thyroid not enlarged, symmetric, no tenderness/mass/nodules Lungs: clear to auscultation bilaterally Heart: regular rate and rhythm, S1, S2 normal, no murmur, click, rub or gallop Abdomen: soft, non-tender; bowel sounds normal; no masses,  no organomegaly Lymph nodes: Cervical, supraclavicular, and axillary nodes normal.  No results found for: HGBA1C  Lab Results  Component Value Date  CREATININE 0.71 12/20/2015   CREATININE 0.68 10/18/2015   CREATININE 0.79 10/11/2015    Lab Results  Component Value Date   WBC 5.0 12/20/2015   HGB 14.0 12/20/2015   HCT 40.9 12/20/2015   PLT 257.0 12/20/2015   GLUCOSE 94 12/20/2015   ALT 17 12/20/2015   AST 21 12/20/2015   NA 137 12/20/2015   K 3.7 12/20/2015   CL 103 12/20/2015   CREATININE 0.71 12/20/2015   BUN 8 12/20/2015   CO2 25 12/20/2015    TSH 2.48 10/05/2014    No results found.  Assessment & Plan:   Problem List Items Addressed This Visit    Constipation    will adjust medications per mother's request and advise use of colace and BFL       Restlessness and agitation    Resolved,  Will taper seroquel dose off from 200 mg daily using 50 mg tablets as follows: 100 mg daily x 7 days,  Then 50 mg daily x 7 days, then stop. Advised to continue valium at current dose given history of seizure disorder.          I have discontinued Ms. Glahn's amoxicillin. I am also having her maintain her diazepam, LamoTRIgine, triamcinolone cream, dextromethorphan-guaiFENesin, pramipexole, diazepam, and Cholecalciferol.  Meds ordered this encounter  Medications  . Cholecalciferol 2000 units CAPS    Sig: Take 1 each by mouth daily.     Medications Discontinued During This Encounter  Medication Reason  . amoxicillin (AMOXIL) 500 MG tablet Completed Course    Follow-up: No Follow-up on file.   Sherlene ShamsULLO, Sritha Chauncey L, MD

## 2016-06-04 NOTE — Progress Notes (Signed)
Pre visit review using our clinic review tool, if applicable. No additional management support is needed unless otherwise documented below in the visit note. 

## 2016-06-04 NOTE — Telephone Encounter (Signed)
Terry from pharmacy called states mom told her that you had discussed tapering off the Seroquel. To change from 200mg  nightly to 100mg  and d/c in a few days? Pharmacy will need new script if that is the plan for patient. Please advise.

## 2016-06-05 NOTE — Assessment & Plan Note (Signed)
Resolved,  Will taper seroquel dose off from 200 mg daily using 50 mg tablets as follows: 100 mg daily x 7 days,  Then 50 mg daily x 7 days, then stop. Advised to continue valium at current dose given history of seizure disorder.

## 2016-06-05 NOTE — Assessment & Plan Note (Signed)
will adjust medications per mother's request and advise use of colace and BFL

## 2016-06-07 ENCOUNTER — Other Ambulatory Visit: Payer: Self-pay | Admitting: Internal Medicine

## 2016-06-07 NOTE — Telephone Encounter (Signed)
I called [facility] pharmacy- they never received rx that we faxed from 05/10/16. I gave verbal for # [phone] with 3 Rf.

## 2016-06-13 ENCOUNTER — Encounter: Payer: Self-pay | Admitting: Internal Medicine

## 2016-06-18 DIAGNOSIS — J340 Abscess, furuncle and carbuncle of nose: Secondary | ICD-10-CM | POA: Diagnosis not present

## 2016-06-18 DIAGNOSIS — H6122 Impacted cerumen, left ear: Secondary | ICD-10-CM | POA: Diagnosis not present

## 2016-06-22 ENCOUNTER — Ambulatory Visit (INDEPENDENT_AMBULATORY_CARE_PROVIDER_SITE_OTHER): Payer: 59 | Admitting: Family Medicine

## 2016-06-22 ENCOUNTER — Encounter: Payer: Self-pay | Admitting: Family Medicine

## 2016-06-22 DIAGNOSIS — B9789 Other viral agents as the cause of diseases classified elsewhere: Secondary | ICD-10-CM | POA: Insufficient documentation

## 2016-06-22 DIAGNOSIS — J988 Other specified respiratory disorders: Secondary | ICD-10-CM | POA: Diagnosis not present

## 2016-06-22 NOTE — Assessment & Plan Note (Signed)
Patient's symptoms likely related to either a viral upper respiratory illness or allergic rhinitis. No focal findings to indicate bacterial illness. No indications of dental infection on exam. Discussed supportive care. Patient's mom reports that antihistamines cause issues with her seizures. She cannot tolerate any nasal sprays either. She has been using pseudoephedrine with good benefit. Discussed she could continue this though try to minimize use. They'll continue to monitor. Given return precautions.

## 2016-06-22 NOTE — Patient Instructions (Signed)
Nice to see you. Your symptoms are likely related to allergies or a virus. You can try Sudafed for congestion. If you develop fevers, breathing issues, or any new or changing symptoms please seek medical attention immediately.

## 2016-06-22 NOTE — Progress Notes (Signed)
  Marikay AlarEric Aerion Bagdasarian, MD Phone: [phone]  Ranae PalmsDanielle E Renda is a 31 y.o. female who presents today for same-day visit.  Patient's mother provides most of the history. They note right-sided ear discomfort over the last several days. Also some right maxillary sinus pressure. Patient's mother reports she has been rubbing her right maxillary sinus. No teeth pain. Clear mucus from her nose. No postnasal drip. No fevers. Some sinus congestion. She saw her ENT earlier this week and he reported that right TM was fine after they irrigated it. She was placed on gentamicin cream for her left nostril.  ROS see history of present illness  Objective  Physical Exam Vitals:   06/22/16 0835  BP: 100/70  Pulse: 82  Temp: 99 F (37.2 C)    BP Readings from Last 3 Encounters:  06/22/16 100/70  06/04/16 110/68  05/08/16 108/60   Wt Readings from Last 3 Encounters:  06/22/16 130 lb (59 kg)  06/04/16 129 lb (58.5 kg)  05/08/16 131 lb (59.4 kg)    Physical Exam  Constitutional: No distress.  HENT:  Head: Normocephalic and atraumatic.  Mouth/Throat: Oropharynx is clear and moist. No oropharyngeal exudate.  Normal TMs bilaterally, teeth on the right side with no apparent cracks or gingival swelling or gingival erythema, upper right teeth nontender  Cardiovascular: Normal rate, regular rhythm and normal heart sounds.   Pulmonary/Chest: Effort normal and breath sounds normal.  Neurological: She is alert.  Skin: She is not diaphoretic.     Assessment/Plan: Please see individual problem list.  Viral respiratory illness Patient's symptoms likely related to either a viral upper respiratory illness or allergic rhinitis. No focal findings to indicate bacterial illness. No indications of dental infection on exam. Discussed supportive care. Patient's mom reports that antihistamines cause issues with her seizures. She cannot tolerate any nasal sprays either. She has been using pseudoephedrine with good  benefit. Discussed she could continue this though try to minimize use. They'll continue to monitor. Given return precautions.   Marikay AlarEric Nithya Meriweather, MD [facility] [place]

## 2016-06-22 NOTE — Progress Notes (Signed)
Pre visit review using our clinic review tool, if applicable. No additional management support is needed unless otherwise documented below in the visit note. 

## 2016-07-05 ENCOUNTER — Other Ambulatory Visit: Payer: Self-pay | Admitting: Internal Medicine

## 2016-08-09 ENCOUNTER — Encounter: Payer: Self-pay | Admitting: Internal Medicine

## 2016-08-09 ENCOUNTER — Ambulatory Visit (INDEPENDENT_AMBULATORY_CARE_PROVIDER_SITE_OTHER): Payer: 59 | Admitting: Internal Medicine

## 2016-08-09 DIAGNOSIS — R451 Restlessness and agitation: Secondary | ICD-10-CM

## 2016-08-09 DIAGNOSIS — K5900 Constipation, unspecified: Secondary | ICD-10-CM

## 2016-08-09 DIAGNOSIS — G808 Other cerebral palsy: Secondary | ICD-10-CM

## 2016-08-09 MED ORDER — RISPERIDONE 0.5 MG PO TABS: 0.5000 mg | ORAL_TABLET | Freq: Every day | ORAL | 2 refills | Status: DC

## 2016-08-09 NOTE — Patient Instructions (Addendum)
I have initiated trial of risperidone  To take 30 minutes prior to bedtime to calm down her nighttime activities.  start with 1/2 tablet  , ok to increase  To full tablet next evening if not strong enough

## 2016-08-09 NOTE — Progress Notes (Signed)
Pre visit review using our clinic review tool, if applicable. No additional management support is needed unless otherwise documented below in the visit note. 

## 2016-08-09 NOTE — Progress Notes (Signed)
Subjective:  Patient ID: Lindsey [name], female    DOB: May 18, 1985  Age: 31 y.o. MRN: [id number]  CC: Diagnoses of Constipation, unspecified constipation type, Other cerebral palsy (HCC), and Restlessness and agitation were pertinent to this visit.  HPI Lindsey [name] presents for follow up on chronic issues including nocturnal agitation and seizure disorder .  She is accompanied by her legal guardian, her mother.   At last visit in January her mother requested that we stop the seroquel, because it was aggravating her daughter's constipation   The medication was stopped after a  2 week wean .  The constipation has resolved.  But she is now waking up at 3:30 or 4:00 am every morning.    Evening regimen of medications reviewed:  She takes lamcital 300 mg bid 7a and 7p.  Takes mirapex and diazaepam at 8:30 . bedtime is 10:00     Outpatient Medications Prior to Visit  Medication Sig Dispense Refill  . diazepam (VALIUM) 5 MG tablet TAKE 1 AND 1/2 TABLET AT BEDTIME AS NEEDED 45 tablet 3  . LamoTRIgine 300 MG TB24 Take 1 tablet by mouth 2 (two) times daily.     . pramipexole (MIRAPEX) 0.25 MG tablet TAKE ONE TABLET BY MOUTH 3 TIMES DAILY 90 tablet 1  . diazepam (DIASTAT ACUDIAL) 10 MG GEL Place 20 mg rectally once.    Marland Kitchen dextromethorphan-guaiFENesin (MUCINEX DM) 30-600 MG 12hr tablet Take 1 tablet by mouth 2 (two) times daily.    . pramipexole (MIRAPEX) 0.25 MG tablet TAKE ONE TABLET BY MOUTH 3 TIMES DAILY (Patient not taking: Reported on 08/09/2016) 90 tablet 1   No facility-administered medications prior to visit.     Review of Systems;  Patient denies headache, fevers, malaise, unintentional weight loss, skin rash, eye pain, sinus congestion and sinus pain, sore throat, dysphagia,  hemoptysis , cough, dyspnea, wheezing, chest pain, palpitations, orthopnea, edema, abdominal pain, nausea, melena, diarrhea, constipation, flank pain, dysuria, hematuria, urinary  Frequency, nocturia, numbness,  tingling, seizures,  Focal weakness, Loss of consciousness,  Tremor, insomnia, depression, anxiety, and suicidal ideation.      Objective:  BP 102/78   Pulse 70   Temp 97.6 F (36.4 C) (Axillary)   Resp 17   Ht  (1.676 m)   Wt 127 lb 9.6 oz (57.9 kg)   LMP 01/09/2011   SpO2 98%   BMI 20.60 kg/m   BP Readings from Last 3 Encounters:  08/09/16 102/78  06/22/16 100/70  06/04/16 110/68    Wt Readings from Last 3 Encounters:  08/09/16 127 lb 9.6 oz (57.9 kg)  06/22/16 130 lb (59 kg)  06/04/16 129 lb (58.5 kg)    General appearance: alert, cooperative and appears stated age Ears: normal TM's and external ear canals both ears Throat: lips, mucosa, and tongue normal; teeth and gums normal Neck: no adenopathy, no carotid bruit, supple, symmetrical, trachea midline and thyroid not enlarged, symmetric, no tenderness/mass/nodules Back: symmetric, no curvature. ROM normal. No CVA tenderness. Lungs: clear to auscultation bilaterally Heart: regular rate and rhythm, S1, S2 normal, no murmur, click, rub or gallop Abdomen: soft, non-tender; bowel sounds normal; no masses,  no organomegaly Pulses: 2+ and symmetric Skin: Skin color, texture, turgor normal. No rashes or lesions Lymph nodes: Cervical, supraclavicular, and axillary nodes normal.  No results found for: HGBA1C  Lab Results  Component Value Date   CREATININE 0.71 12/20/2015   CREATININE 0.68 10/18/2015   CREATININE 0.79 10/11/2015    Lab  Results  Component Value Date   WBC 5.0 12/20/2015   HGB 14.0 12/20/2015   HCT 40.9 12/20/2015   PLT 257.0 12/20/2015   GLUCOSE 94 12/20/2015   ALT 17 12/20/2015   AST 21 12/20/2015   NA 137 12/20/2015   K 3.7 12/20/2015   CL 103 12/20/2015   CREATININE 0.71 12/20/2015   BUN 8 12/20/2015   CO2 25 12/20/2015   TSH 2.48 10/05/2014    No results found.  Assessment & Plan:   Problem List Items Addressed This Visit    Cerebral palsy (HCC)    She remains unable to  function independently.        Constipation    Managed with daily stool softener and bfl, iproved since stopping seroquel      Restlessness and agitation    She has developed regular nocturnal waking at 3 am without the seroquel.  Trial of risperdal        A total of 25 minutes of face to face time was spent with patient and mother, more than half of which was spent in counselling about the above mentioned conditions  and coordination of care  I have discontinued Ms. Gaubert's dextromethorphan-guaiFENesin and DIASTAT ACUDIAL. I am also having her maintain her diazepam, LamoTRIgine, pramipexole, diazepam, and LAMICTAL.  Meds ordered this encounter  Medications  . DISCONTD: DIASTAT ACUDIAL 20 MG GEL  . LAMICTAL 100 MG tablet  . DISCONTD: risperiDONE (RISPERDAL) 0.5 MG tablet    Sig: Take 1 tablet (0.5 mg total) by mouth at bedtime.    Dispense:  30 tablet    Refill:  2    Medications Discontinued During This Encounter  Medication Reason  . dextromethorphan-guaiFENesin (MUCINEX DM) 30-600 MG 12hr tablet Patient has not taken in last 30 days  . pramipexole (MIRAPEX) 0.25 MG tablet Patient has not taken in last 30 days  . DIASTAT ACUDIAL 20 MG GEL Patient has not taken in last 30 days    Follow-up: No Follow-up on file.   Sherlene Shams, MD

## 2016-08-10 MED ORDER — RISPERIDONE 0.5 MG PO TABS: 0.2500 mg | ORAL_TABLET | Freq: Every day | ORAL | 2 refills | Status: DC

## 2016-08-11 NOTE — Assessment & Plan Note (Signed)
She has developed regular nocturnal waking at 3 am without the seroquel.  Trial of risperdal

## 2016-08-11 NOTE — Assessment & Plan Note (Signed)
She remains unable to function independently.

## 2016-08-11 NOTE — Assessment & Plan Note (Signed)
Managed with daily stool softener and bfl, iproved since stopping seroquel

## 2016-08-13 ENCOUNTER — Telehealth: Payer: Self-pay | Admitting: Internal Medicine

## 2016-08-13 NOTE — Telephone Encounter (Signed)
Form in you red folder to be signed

## 2016-08-13 NOTE — Telephone Encounter (Signed)
Pt's mother dropped off a physician's order to be signed by Dr. Darrick Huntsman. Paper is up front in color folder.

## 2016-08-14 NOTE — Telephone Encounter (Signed)
Mailed to mother with envelope provided.

## 2016-08-14 NOTE — Telephone Encounter (Signed)
Signed and returned in red folder .  No charge

## 2016-08-31 ENCOUNTER — Other Ambulatory Visit: Payer: Self-pay | Admitting: Internal Medicine

## 2016-09-04 ENCOUNTER — Telehealth: Payer: Self-pay | Admitting: *Deleted

## 2016-09-04 DIAGNOSIS — R112 Nausea with vomiting, unspecified: Secondary | ICD-10-CM

## 2016-09-04 MED ORDER — ONDANSETRON 4 MG PO TBDP: 4.0000 mg | ORAL_TABLET | Freq: Three times a day (TID) | ORAL | 0 refills | Status: DC | PRN

## 2016-09-04 NOTE — Telephone Encounter (Signed)
Spoke with mom [name]  Reason for call: vomiting/diarrhea  Symptoms:vomiting and diarrhea, fever last night, no fever this am  Duration yesterday  Medications:Zofran expired , Tylenol then Ibuprofen every 2 hours,  Requesting medication for nausea , due to she has seizures, able to take ginger ale, drinking ginger ale,  still urinating,  Mom had stomach virus Saturday, mom going to stick with BRAT diet, no seizure, please advise

## 2016-09-04 NOTE — Telephone Encounter (Signed)
Sent in zofran  Please advise mom to make an appt or go to urgent care if symptoms do not improve today, she has severe adbominal pain, or fever returns.

## 2016-09-04 NOTE — Telephone Encounter (Signed)
Patients mom advised of below and verbalized understanding.

## 2016-09-04 NOTE — Telephone Encounter (Signed)
Patient has a stomach virus. She currently has vomiting and diarrhea.  Patient's mother requested to have a Rx for Zofran called into the pharmacy to help with nausea.  Contact  Mother [name] [phone]  Pharmacy [facility]

## 2016-09-08 ENCOUNTER — Encounter: Payer: Self-pay | Admitting: Internal Medicine

## 2016-09-10 ENCOUNTER — Other Ambulatory Visit: Payer: Self-pay | Admitting: Internal Medicine

## 2016-09-10 ENCOUNTER — Encounter: Payer: Self-pay | Admitting: Internal Medicine

## 2016-09-10 ENCOUNTER — Ambulatory Visit (INDEPENDENT_AMBULATORY_CARE_PROVIDER_SITE_OTHER): Payer: 59 | Admitting: Internal Medicine

## 2016-09-10 ENCOUNTER — Ambulatory Visit (INDEPENDENT_AMBULATORY_CARE_PROVIDER_SITE_OTHER): Payer: 59

## 2016-09-10 VITALS — BP 110/70 | HR 83 | Temp 97.4°F | Wt 126.0 lb

## 2016-09-10 DIAGNOSIS — K5900 Constipation, unspecified: Secondary | ICD-10-CM | POA: Diagnosis not present

## 2016-09-10 DIAGNOSIS — K5909 Other constipation: Secondary | ICD-10-CM

## 2016-09-10 DIAGNOSIS — G40909 Epilepsy, unspecified, not intractable, without status epilepticus: Secondary | ICD-10-CM | POA: Diagnosis not present

## 2016-09-10 DIAGNOSIS — R451 Restlessness and agitation: Secondary | ICD-10-CM

## 2016-09-10 DIAGNOSIS — R1013 Epigastric pain: Secondary | ICD-10-CM | POA: Diagnosis not present

## 2016-09-10 DIAGNOSIS — R194 Change in bowel habit: Secondary | ICD-10-CM

## 2016-09-10 DIAGNOSIS — G808 Other cerebral palsy: Secondary | ICD-10-CM

## 2016-09-10 MED ORDER — PANTOPRAZOLE SODIUM 40 MG PO TBEC: 40.0000 mg | DELAYED_RELEASE_TABLET | Freq: Every day | ORAL | 3 refills | Status: DC

## 2016-09-10 NOTE — Progress Notes (Signed)
Pre visit review using our clinic review tool, if applicable. No additional management support is needed unless otherwise documented below in the visit note. 

## 2016-09-10 NOTE — Patient Instructions (Addendum)
Her x rays suggest she has A MILD IMPACTION IN THE RECTUM  YOU CAN TRY MILK OF MAGNESIUM  AND FLEETS OR OTHER TYPE OF ENEMA TO RESOLVE THIS     TRIAL OF PROTONIX TO SEE IF Lindsey [name]'S STOMACH PAINS IMPROVE.  TAKE IN THE MORNING  GI REFERRAL UNDER WAY

## 2016-09-10 NOTE — Progress Notes (Signed)
Subjective:  Patient ID: Lindsey [name], female    DOB: [birth year]  Age: 31 y.o. MRN: [id number]  CC: The primary encounter diagnosis was Other constipation. Diagnoses of Other cerebral palsy (HCC), Seizure disorder, primary (HCC), Restlessness and agitation, and Epigastric pain were also pertinent to this visit.  HPI Lindsey [name] presents for worsening constipation and ongoing weight loss .    She is a 31 yr old female with cerebral palsy,  intellectual disability accompanied by partial epilepsy /seizure  disorder s/P craniotomy for temporal lobectomy (1994), S/P implantation of vagal  Nerve stimulator  (2014) , AND RESTLESS LEGS SYNDROME who d is accompanied by her mother and caregiver who provides 100% of history.  Lindsey [name] has been plagued  with constipation for the last 5 to 6 months, managed with daily miralax.  She apparently had a GI i viral infection last Monday that lasted about 36 hours and during that time had recurrent vomiting and diarrhea that lasted until Tuesday.  Several other family members were also infected.  The diarrhea was resolved by Tuesday evening and she had no stool on Wed or Thursday.  Last antibiotic use amoxicilin prescribed by her dentist in Nov 2017 periprocedurally for for dental restoration and cleaning  [facility]).  Her mother gave her Dulcolax  Stimulant laxative on thursday and Friday along with fiber supplement and Miralax.  On Saturday she gave her a  glycerin suppository on Saturday, which produced a loose stool on Saturday and another loose stool on Sunday evening,      When she was seen in April, mother had  Reported that since stopping the seroquel ,  The constipatioin had improved, but she now states that prior to the current illness, the constipation had returned,  And Lindsey [name] would frequently complain in the mornigns that her stomach hurt.   A one view Plain film today shows a large amount  of stool in the rectum and lots of  Gas throughout the  colon.     Her mother is concerned about the 20 lb  weight loss that has occurred since last July.   She does recall that I had addressed the weight gain and recommended low glycemic index diet  For Lindsey [name],  And she has reduced the concentrated sweets in her diet, but also notes that Lindsey [name]'s appetite has been somewhat mildly decreased,  Although she does not refuse food at meals.   There is no FH of colon CA,  But Lindsey [name]'s Paternal great GF had Lindsey [name] adenoCA (presumed )  She has no prior colonoscopy or EGD         Outpatient Medications Prior to Visit  Medication Sig Dispense Refill  . diazepam (DIASTAT ACUDIAL) 10 MG GEL Place 20 mg rectally once.    . diazepam (VALIUM) 5 MG tablet TAKE 1 AND 1/2 TABLET AT BEDTIME AS NEEDED 45 tablet 3  . LAMICTAL 100 MG tablet 2 (two) times daily. Take three tablets two times daily.    . pramipexole (MIRAPEX) 0.25 MG tablet TAKE ONE TABLET BY MOUTH 3 TIMES DAILY 90 tablet 1  . risperiDONE (RISPERDAL) 0.5 MG tablet Take 0.5 tablets (0.25 mg total) by mouth at bedtime. May give additional 1/2 tablet if needed per night 30 tablet 2  . LamoTRIgine 300 MG TB24 Take 1 tablet by mouth 2 (two) times daily.     . ondansetron (ZOFRAN ODT) 4 MG disintegrating tablet Take 1 tablet (4 mg total) by mouth every 8 (eight) hours as needed  for nausea or vomiting. 20 tablet 0  . pramipexole (MIRAPEX) 0.25 MG tablet TAKE ONE TABLET BY MOUTH 3 TIMES DAILY 90 tablet 1   No facility-administered medications prior to visit.     Review of Systems;  Patient unable to provide reliable history,  but per mother denies headache, fevers, malaise,skin rash, eye pain, sinus congestion and sinus pain, sore throat, dysphagia,  hemoptysis , cough, dyspnea, wheezing, chest pain, palpitations, orthopnea, edema, , nausea, melena,  flank pain, dysuria, hematuria, urinary  Frequency, nocturia, numbness, tingling, Focal weakness, Loss of consciousness,  Tremor, insomnia, depression,  anxiety, and suicidal ideation.      Objective:  BP 110/70   Pulse 83   Temp 97.4 F (36.3 C) (Oral)   Wt 126 lb (57.2 kg)   LMP 01/09/2011   SpO2 94%   BMI 20.34 kg/m   BP Readings from Last 3 Encounters:  09/10/16 110/70  08/09/16 102/78  06/22/16 100/70    Wt Readings from Last 3 Encounters:  09/10/16 126 lb (57.2 kg)  08/09/16 127 lb 9.6 oz (57.9 kg)  06/22/16 130 lb (59 kg)    General appearance: alert, cooperative and appears younger than stated age Ears: deferred per patient request Throat: lips, mucosa, and tongue normal; teeth and gums normal Neck: no adenopathy, no carotid bruit, supple, symmetrical, trachea midline and thyroid not enlarged, symmetric, no tenderness/mass/nodules Back: symmetric, no curvature. ROM normal. No CVA tenderness. Lungs: clear to auscultation bilaterally Heart: regular rate and rhythm, S1, S2 normal, no murmur, click, rub or gallop Abdomen: soft, non-tender; bowel sounds normal; no masses,  no organomegaly Pulses: 2+ and symmetric Skin: Skin color, texture, turgor normal. No rashes or lesions Lymph nodes: Cervical, supraclavicular, and axillary nodes normal.  No results found for: HGBA1C  Lab Results  Component Value Date   CREATININE 0.71 12/20/2015   CREATININE 0.68 10/18/2015   CREATININE 0.79 10/11/2015    Lab Results  Component Value Date   WBC 5.0 12/20/2015   HGB 14.0 12/20/2015   HCT 40.9 12/20/2015   PLT 257.0 12/20/2015   GLUCOSE 94 12/20/2015   ALT 17 12/20/2015   AST 21 12/20/2015   NA 137 12/20/2015   K 3.7 12/20/2015   CL 103 12/20/2015   CREATININE 0.71 12/20/2015   BUN 8 12/20/2015   CO2 25 12/20/2015   TSH 2.48 10/05/2014    No results found.  Assessment & Plan:   Problem List Items Addressed This Visit    Abdominal pain    History difficult given her intellectual capacity. Empiric treatment for gastritis with protonix pending GI evaluation.       Cerebral palsy (HCC)    She has an  intellectual disability , attends afternoon daycare .       Constipation - Primary    Subacute in onset ,  Per mother started in January 2018.  She is concerned that her weight loss and constipation may be due to an occult malignanacy and is requesting GI evaluation.  Referral to [facility] in process.       Relevant Orders   Ambulatory referral to Gastroenterology   Restlessness and agitation    Now managed with risperdal      Seizure disorder, primary (HCC)    Partial epilepsy, s/p with vagal nerve stimulator and temporal lobectomy , managed with lamictal and prn valium          I have discontinued Ms. Choo's LamoTRIgine and ondansetron. I am also having her start on  pantoprazole. Additionally, I am having her maintain her diazepam, pramipexole, diazepam, LAMICTAL, and risperiDONE.  Meds ordered this encounter  Medications  . pantoprazole (PROTONIX) 40 MG tablet    Sig: Take 1 tablet (40 mg total) by mouth daily.    Dispense:  30 tablet    Refill:  3    Medications Discontinued During This Encounter  Medication Reason  . pramipexole (MIRAPEX) 0.25 MG tablet Error  . ondansetron (ZOFRAN ODT) 4 MG disintegrating tablet Error  . LamoTRIgine 300 MG TB24 Error    Follow-up: No Follow-up on file.   Sherlene ShamsULLO, Meldon Hanzlik L, MD

## 2016-09-11 DIAGNOSIS — R109 Unspecified abdominal pain: Secondary | ICD-10-CM | POA: Insufficient documentation

## 2016-09-11 NOTE — Assessment & Plan Note (Signed)
Now managed with risperdal

## 2016-09-11 NOTE — Assessment & Plan Note (Signed)
History difficult given her intellectual capacity. Empiric treatment for gastritis with protonix pending GI evaluation.

## 2016-09-11 NOTE — Assessment & Plan Note (Signed)
Partial epilepsy, s/p with vagal nerve stimulator and temporal lobectomy , managed with lamictal and prn valium

## 2016-09-11 NOTE — Assessment & Plan Note (Signed)
She has an intellectual disability , attends afternoon daycare .

## 2016-09-11 NOTE — Assessment & Plan Note (Signed)
Subacute in onset ,  Per mother started in January 2018.  She is concerned that her weight loss and constipation may be due to an occult malignanacy and is requesting GI evaluation.  Referral to [facility] in process.

## 2016-10-01 ENCOUNTER — Other Ambulatory Visit: Payer: Self-pay | Admitting: Internal Medicine

## 2016-10-04 ENCOUNTER — Encounter: Payer: Self-pay | Admitting: Internal Medicine

## 2016-10-04 ENCOUNTER — Other Ambulatory Visit: Payer: Self-pay | Admitting: Internal Medicine

## 2016-10-04 MED ORDER — PRAMIPEXOLE DIHYDROCHLORIDE 0.25 MG PO TABS: 0.7500 mg | ORAL_TABLET | Freq: Every day | ORAL | 1 refills | Status: DC

## 2016-10-08 ENCOUNTER — Other Ambulatory Visit: Payer: Self-pay | Admitting: Internal Medicine

## 2016-10-23 ENCOUNTER — Encounter: Payer: Self-pay | Admitting: Internal Medicine

## 2016-10-23 DIAGNOSIS — K625 Hemorrhage of anus and rectum: Secondary | ICD-10-CM | POA: Diagnosis not present

## 2016-10-23 DIAGNOSIS — K5900 Constipation, unspecified: Secondary | ICD-10-CM | POA: Diagnosis not present

## 2016-10-23 DIAGNOSIS — R194 Change in bowel habit: Secondary | ICD-10-CM | POA: Diagnosis not present

## 2016-10-29 ENCOUNTER — Ambulatory Visit (INDEPENDENT_AMBULATORY_CARE_PROVIDER_SITE_OTHER): Payer: 59 | Admitting: Internal Medicine

## 2016-10-29 ENCOUNTER — Encounter: Payer: Self-pay | Admitting: Internal Medicine

## 2016-10-29 DIAGNOSIS — K5900 Constipation, unspecified: Secondary | ICD-10-CM

## 2016-10-29 DIAGNOSIS — R1013 Epigastric pain: Secondary | ICD-10-CM | POA: Diagnosis not present

## 2016-10-29 NOTE — Patient Instructions (Signed)
If Lindsey [name]'s stomach issues return without Pantoprazole, you can try using OTC famotidine  20 mg once or twice daily  (available at [facility])   The color of the stool is not alarming ; the consistency is what matters.. Can reduce citrucel as long as stools remain solid and not difficult to pass.

## 2016-10-29 NOTE — Progress Notes (Signed)
Subjective:  Patient ID: Lindsey [name], female    DOB: 1985-09-16  Age: 31 y.o. MRN: [id number]  CC: Diagnoses of Constipation, unspecified constipation type and Epigastric pain were pertinent to this visit.  HPI Lindsey [name] presents for follow up on weight loss and constipation .   Since her last visit her appetite has  improved and and weight gain noted. She is using citrucel 2 to  4 times daily. Her colonoscopy has been planned for November .    Her mother would like to discontinue protonix   Outpatient Medications Prior to Visit  Medication Sig Dispense Refill  . diazepam (DIASTAT ACUDIAL) 10 MG GEL Place 20 mg rectally once.    . diazepam (VALIUM) 5 MG tablet 1 AND 1/2 TABLETS BY MOUTH AT BEDTIME 45 tablet 1  . LAMICTAL 100 MG tablet 2 (two) times daily. Take three tablets two times daily.    . pantoprazole (PROTONIX) 40 MG tablet Take 1 tablet (40 mg total) by mouth daily. 30 tablet 3  . pramipexole (MIRAPEX) 0.25 MG tablet Take 3 tablets (0.75 mg total) by mouth daily. 90 tablet 1  . risperiDONE (RISPERDAL) 0.5 MG tablet Take 0.5 tablets (0.25 mg total) by mouth at bedtime. May give additional 1/2 tablet if needed per night 30 tablet 2  . risperiDONE (RISPERDAL) 0.5 MG tablet TAKE ONE TABLET BY MOUTH AT BEDTIME MAY GIVE ADDITIONAL 1/2 TABLET IFNEEDEDPER NIGHT 30 tablet 1   No facility-administered medications prior to visit.     Review of Systems;  Patient denies headache, fevers, malaise, unintentional weight loss, skin rash, eye pain, sinus congestion and sinus pain, sore throat, dysphagia,  hemoptysis , cough, dyspnea, wheezing, chest pain, palpitations, orthopnea, edema, abdominal pain, nausea, melena, diarrhea, constipation, flank pain, dysuria, hematuria, urinary  Frequency, nocturia, numbness, tingling, seizures,  Focal weakness, Loss of consciousness,  Tremor, insomnia, depression, anxiety, and suicidal ideation.      Objective:  BP 122/64 (BP Location:  Left Arm, Patient Position: Sitting, Cuff Size: Normal)   Pulse 87   Temp 97.5 F (36.4 C) (Oral)   Resp 16   Ht 5\' 6"  (1.676 m)   Wt 128 lb 9.6 oz (58.3 kg)   LMP 01/09/2011   SpO2 97%   BMI 20.76 kg/m   BP Readings from Last 3 Encounters:  10/29/16 122/64  09/10/16 110/70  08/09/16 102/78    Wt Readings from Last 3 Encounters:  10/29/16 128 lb 9.6 oz (58.3 kg)  09/10/16 126 lb (57.2 kg)  08/09/16 127 lb 9.6 oz (57.9 kg)    General appearance: alert, cooperative and appears stated age Ears: normal TM's and external ear canals both ears Throat: lips, mucosa, and tongue normal; teeth and gums normal Neck: no adenopathy, no carotid bruit, supple, symmetrical, trachea midline and thyroid not enlarged, symmetric, no tenderness/mass/nodules Back: symmetric, no curvature. ROM normal. No CVA tenderness. Lungs: clear to auscultation bilaterally Heart: regular rate and rhythm, S1, S2 normal, no murmur, click, rub or gallop Abdomen: soft, non-tender; bowel sounds normal; no masses,  no organomegaly Pulses: 2+ and symmetric Skin: Skin color, texture, turgor normal. No rashes or lesions Lymph nodes: Cervical, supraclavicular, and axillary nodes normal.  No results found for: HGBA1C  Lab Results  Component Value Date   CREATININE 0.71 12/20/2015   CREATININE 0.68 10/18/2015   CREATININE 0.79 10/11/2015    Lab Results  Component Value Date   WBC 5.0 12/20/2015   HGB 14.0 12/20/2015   HCT 40.9 12/20/2015  PLT 257.0 12/20/2015   GLUCOSE 94 12/20/2015   ALT 17 12/20/2015   AST 21 12/20/2015   NA 137 12/20/2015   K 3.7 12/20/2015   CL 103 12/20/2015   CREATININE 0.71 12/20/2015   BUN 8 12/20/2015   CO2 25 12/20/2015   TSH 2.48 10/05/2014    No results found.  Assessment & Plan:   Problem List Items Addressed This Visit    Constipation    improved with increased use of citrucel.  Plans for constipation in November may be postponed per discussion with mother       Abdominal pain    improved with resolution of constipation.  Will dc protonix          I am having Ms. Kernen maintain her diazepam, LAMICTAL, risperiDONE, pantoprazole, risperiDONE, pramipexole, and diazepam.  No orders of the defined types were placed in this encounter.   There are no discontinued medications.  Follow-up: No Follow-up on file.   Sherlene ShamsULLO, TERESA L, MD

## 2016-10-29 NOTE — Progress Notes (Signed)
?????? ??? ??   5? 4?? ?? ?? ?? ????? ?? ??? ????.  ?? ? ?? ???????? ???????? ??? ?? ???? ?????. ???? ?? ???! ???

## 2016-10-30 NOTE — Assessment & Plan Note (Signed)
improved with resolution of constipation.  Will dc protonix

## 2016-10-30 NOTE — Assessment & Plan Note (Signed)
improved with increased use of citrucel.  Plans for constipation in November may be postponed per discussion with mother

## 2016-10-31 ENCOUNTER — Other Ambulatory Visit: Payer: Self-pay | Admitting: Internal Medicine

## 2016-11-12 ENCOUNTER — Ambulatory Visit (INDEPENDENT_AMBULATORY_CARE_PROVIDER_SITE_OTHER): Payer: 59 | Admitting: Family Medicine

## 2016-11-12 ENCOUNTER — Encounter: Payer: Self-pay | Admitting: Family Medicine

## 2016-11-12 DIAGNOSIS — R05 Cough: Secondary | ICD-10-CM | POA: Diagnosis not present

## 2016-11-12 DIAGNOSIS — R059 Cough, unspecified: Secondary | ICD-10-CM

## 2016-11-12 NOTE — Progress Notes (Signed)
  Lindsey AlarEric Can Lucci, MD Phone: [phone]  Lindsey PalmsDanielle E [name] is a 31 y.o. female who presents today for day visit.  The patient presents with her mother who provides the history. Patient has a seizure disorder and had a grand mal seizure 3 days ago. This was followed by vomiting which is typical of her following seizures. Then started coughing and complaining of sore throat. Typically does vomit after her seizures. The grand mal seizure lasted for about 5 minutes and they used rectal Diastat which resolved it. She does have a history of grand mal seizures but they do not occur very frequently. She has partial seizures 1-2 times a month. She has had no shortness of breath. Cough is nonproductive. She has had no upper respiratory symptoms. They deny any preceding abnormal symptoms leading up to the seizure. She has been having bowel movements most days. No blood in her vomit. She possibly felt slightly warm on Friday night though has not had any fevers or felt warm since. They have not missed any doses of medication. She is back at her baseline.  ROS see history of present illness  Objective  Physical Exam Vitals:   11/12/16 1312  BP: 112/80  Pulse: 71  Temp: 98.4 F (36.9 C)    BP Readings from Last 3 Encounters:  11/12/16 112/80  10/29/16 122/64  09/10/16 110/70   Wt Readings from Last 3 Encounters:  11/12/16 127 lb 9.6 oz (57.9 kg)  10/29/16 128 lb 9.6 oz (58.3 kg)  09/10/16 126 lb (57.2 kg)    Physical Exam  Constitutional: No distress.  Cardiovascular: Normal rate, regular rhythm and normal heart sounds.   Pulmonary/Chest: Effort normal and breath sounds normal.  Abdominal: Soft. She exhibits no distension. There is no tenderness. There is no rebound and no guarding.  Musculoskeletal: She exhibits no edema.  Neurological: She is alert.  5/5 strength in bilateral biceps, triceps, grip, quads, hamstrings, plantar and dorsiflexion, sensation to light touch intact in bilateral UE  and LE  Skin: Skin is dry. She is not diaphoretic.     Assessment/Plan: Please see individual problem list.  Cough Patient with slight cough over the last 3 days after vomiting following a seizure. She has a benign lung exam. Has had no fevers. Vital signs are stable. Discussed given cough following vomiting following a seizure there is risk for aspiration as a cause. Discussed that her lung exam was normal. Offered x-ray to rule out aspiration though patient's mother declined. Suspect possible throat irritation as cause as well. Advised monitoring for any symptoms of infection such as fever, productive cough, trouble breathing, or any new symptoms. Given grand mal seizure I did discuss checking a Lamictal level though they deferred this as well. They have follow-up in the next several months with neurology. Advised that if she were to have further seizures that are more frequent than typical they should contact the neurologist. They do have rectal Diastat on hand.  Lindsey AlarEric Lindsey Ramone, MD [facility] [place] [facility]

## 2016-11-12 NOTE — Patient Instructions (Signed)
Nice to see you. Please monitor for worsening cough, cough productive of anything, fevers, trouble breathing, recurrent seizures, or any new or changing symptoms. If these things occur please seek medical attention.

## 2016-11-12 NOTE — Assessment & Plan Note (Signed)
Patient with slight cough over the last 3 days after vomiting following a seizure. She has a benign lung exam. Has had no fevers. Vital signs are stable. Discussed given cough following vomiting following a seizure there is risk for aspiration as a cause. Discussed that her lung exam was normal. Offered x-ray to rule out aspiration though patient's mother declined. Suspect possible throat irritation as cause as well. Advised monitoring for any symptoms of infection such as fever, productive cough, trouble breathing, or any new symptoms. Given grand mal seizure I did discuss checking a Lamictal level though they deferred this as well. They have follow-up in the next several months with neurology. Advised that if she were to have further seizures that are more frequent than typical they should contact the neurologist. They do have rectal Diastat on hand.

## 2016-11-15 DIAGNOSIS — J209 Acute bronchitis, unspecified: Secondary | ICD-10-CM | POA: Diagnosis not present

## 2016-11-23 ENCOUNTER — Other Ambulatory Visit: Payer: Self-pay | Admitting: Internal Medicine

## 2016-12-06 ENCOUNTER — Other Ambulatory Visit: Payer: Self-pay | Admitting: Family

## 2016-12-06 ENCOUNTER — Encounter: Payer: Self-pay | Admitting: Family

## 2016-12-06 ENCOUNTER — Ambulatory Visit (INDEPENDENT_AMBULATORY_CARE_PROVIDER_SITE_OTHER): Payer: 59 | Admitting: Family

## 2016-12-06 ENCOUNTER — Ambulatory Visit (INDEPENDENT_AMBULATORY_CARE_PROVIDER_SITE_OTHER): Payer: 59

## 2016-12-06 VITALS — BP 122/76 | HR 82 | Temp 98.0°F | Ht 66.0 in | Wt 127.8 lb

## 2016-12-06 DIAGNOSIS — R05 Cough: Secondary | ICD-10-CM | POA: Diagnosis not present

## 2016-12-06 DIAGNOSIS — J40 Bronchitis, not specified as acute or chronic: Secondary | ICD-10-CM | POA: Diagnosis not present

## 2016-12-06 LAB — POCT RAPID STREP A (OFFICE): Rapid Strep A Screen: NEGATIVE

## 2016-12-06 MED ORDER — PREDNISONE 10 MG PO TABS: ORAL_TABLET | ORAL | 0 refills | Status: DC

## 2016-12-06 NOTE — Progress Notes (Signed)
Pre visit review using our clinic review tool, if applicable. No additional management support is needed unless otherwise documented below in the visit note. 

## 2016-12-06 NOTE — Progress Notes (Signed)
Subjective:    Patient ID: Ranae Palmsanielle E Bollen, female    DOB: Aug 05, 1985, 31 y.o.   MRN: [id number]  CC: Ranae PalmsDanielle E Duling is a 31 y.o. female who presents today for an acute visit.    HPI: CC: cough 4 weeks, with some improvement.  Recently seen by ENT, Dr Jenne CampusMcqueen for bronchitis 3 weeks ago, and started on omnicef  Mom reports more of a wet cough. Hasn't tried any cough medication.   No wheezing, sob, fever, N, vomiting, ear pain, sinus congestion.       HISTORY:  Past Medical History:  Diagnosis Date  . Epilepsy [facility](HCC) age 73   negative metabolic workup has brain abnormalities by MRI (Dr. Quintin Altoadtke, [facility])  . Insomnia   . Status post VNS (vagus nerve stimulator) placement 2000  . Temporal lobectomy behavior syndrome 1994   Right   Past Surgical History:  Procedure Laterality Date  . ABDOMINAL HYSTERECTOMY  2005  . BRAIN SURGERY     Temporal Lobe resection  . CRANIOTOMY FOR TEMPORAL LOBECTOMY    . TONSILLECTOMY AND ADENOIDECTOMY  age 89   No family history on file.  Allergies: Influenza virus vaccine split; Benadryl [diphenhydramine hcl]; and Zyrtec [cetirizine hcl] Current Outpatient Prescriptions on File Prior to Visit  Medication Sig Dispense Refill  . diazepam (DIASTAT ACUDIAL) 10 MG GEL Place 20 mg rectally once.    . diazepam (VALIUM) 5 MG tablet TAKE 1 AND A HALF (1.5) TABLETS BY MOUTHAT BEDTIME 45 tablet 0  . LAMICTAL 100 MG tablet 2 (two) times daily. Take three tablets two times daily.    . pramipexole (MIRAPEX) 0.25 MG tablet Take 3 tablets (0.75 mg total) by mouth daily. 90 tablet 1  . risperiDONE (RISPERDAL) 0.5 MG tablet Take 0.5 tablets (0.25 mg total) by mouth at bedtime. May give additional 1/2 tablet if needed per night 30 tablet 2  . risperiDONE (RISPERDAL) 0.5 MG tablet TAKE ONE TABLET BY MOUTH AT BEDTIME. MAYGIVE ADDITIONAL 1/2 TABLET INNEEDED 30 tablet 0   No current facility-administered medications on file prior to visit.     Social History    Substance Use Topics  . Smoking status: Never Smoker  . Smokeless tobacco: Never Used  . Alcohol use No    Review of Systems  Constitutional: Negative for chills and fever.  HENT: Positive for sore throat.   Respiratory: Negative for cough.   Cardiovascular: Negative for chest pain and palpitations.  Gastrointestinal: Negative for nausea and vomiting.  Genitourinary: Negative for flank pain.      Objective:    BP 122/76   Pulse 82   Temp 98 F (36.7 C) (Oral)   Ht 5\' 6"  (1.676 m)   Wt 127 lb 12.8 oz (58 kg)   LMP 01/09/2011   SpO2 96%   BMI 20.63 kg/m    Physical Exam  Constitutional: She appears well-developed and well-nourished.  HENT:  Head: Normocephalic and atraumatic.  Right Ear: Hearing, tympanic membrane, external ear and ear canal normal. No drainage, swelling or tenderness. No foreign bodies. Tympanic membrane is not erythematous and not bulging. No middle ear effusion. No decreased hearing is noted.  Left Ear: Hearing, tympanic membrane, external ear and ear canal normal. No drainage, swelling or tenderness. No foreign bodies. Tympanic membrane is not erythematous and not bulging.  No middle ear effusion. No decreased hearing is noted.  Nose: Nose normal. No rhinorrhea. Right sinus exhibits no maxillary sinus tenderness and no frontal sinus tenderness. Left sinus exhibits  no maxillary sinus tenderness and no frontal sinus tenderness.  Mouth/Throat: Uvula is midline, oropharynx is clear and moist and mucous membranes are normal. No oropharyngeal exudate, posterior oropharyngeal edema, posterior oropharyngeal erythema or tonsillar abscesses.  Eyes: Conjunctivae are normal.  Cardiovascular: Regular rhythm, normal heart sounds and normal pulses.   Pulmonary/Chest: Effort normal and breath sounds normal. She has no wheezes. She has no rhonchi. She has no rales.  Lymphadenopathy:       Head (right side): No submental, no submandibular, no tonsillar, no preauricular, no  posterior auricular and no occipital adenopathy present.       Head (left side): No submental, no submandibular, no tonsillar, no preauricular, no posterior auricular and no occipital adenopathy present.    She has no cervical adenopathy.  Neurological: She is alert.  Skin: Skin is warm and dry.  Psychiatric: She has a normal mood and affect. Her speech is normal and behavior is normal. Thought content normal.  Vitals reviewed.      Assessment & Plan:   1. Bronchitis sa02 96%. Afebrile. No acute respiratory distress. Recently treated with 2 week omnicef. Pending cxr to ensure no PNA. If no PNA, will trial short course of prednisone. Return precautions given. Follows up with Mcqeen next week.  - POCT rapid strep A - DG Chest 2 View    I am having Ms. Heaphy maintain her diazepam, LAMICTAL, risperiDONE, pramipexole, diazepam, and risperiDONE.   No orders of the defined types were placed in this encounter.   Return precautions given.   Risks, benefits, and alternatives of the medications and treatment plan prescribed today were discussed, and patient expressed understanding.   Education regarding symptom management and diagnosis given to patient on AVS.  Continue to follow with Sherlene Shamsullo, Teresa L, MD for routine health maintenance.   Ranae Palmsanielle E Feltz and I agreed with plan.   Rennie PlowmanMargaret Averill Winters, FNP

## 2016-12-06 NOTE — Patient Instructions (Addendum)
Honey as natural cough suppressant  vicks vapor rub  Pending CXR- if negative for infection, suspect viral bronchitis and we could start short course steroids.   Plenty of water to thin out secretion.    Let us know if not better

## 2016-12-12 DIAGNOSIS — J45991 Cough variant asthma: Secondary | ICD-10-CM | POA: Diagnosis not present

## 2016-12-12 DIAGNOSIS — J209 Acute bronchitis, unspecified: Secondary | ICD-10-CM | POA: Diagnosis not present

## 2016-12-29 ENCOUNTER — Other Ambulatory Visit: Payer: Self-pay | Admitting: Internal Medicine

## 2017-01-01 ENCOUNTER — Other Ambulatory Visit: Payer: Self-pay | Admitting: Internal Medicine

## 2017-01-08 ENCOUNTER — Ambulatory Visit (INDEPENDENT_AMBULATORY_CARE_PROVIDER_SITE_OTHER): Payer: 59 | Admitting: Family Medicine

## 2017-01-08 ENCOUNTER — Encounter: Payer: Self-pay | Admitting: Family Medicine

## 2017-01-08 ENCOUNTER — Telehealth: Payer: Self-pay

## 2017-01-08 DIAGNOSIS — R059 Cough, unspecified: Secondary | ICD-10-CM | POA: Insufficient documentation

## 2017-01-08 DIAGNOSIS — R05 Cough: Secondary | ICD-10-CM | POA: Insufficient documentation

## 2017-01-08 NOTE — Telephone Encounter (Signed)
Spoke with mom to clarify reason for visit in our office. Appointment was not scheduled to manage seizures. It was scheduled due to congestion in chest post seizure on Thursday.

## 2017-01-08 NOTE — Assessment & Plan Note (Signed)
New acute problem. Acute uncomplicated illness. Exam unremarkable. We discussed obtaining an x-ray and mother elected to just monitor her as she is quite bothered by procedures including getting a chest x-ray. Advised over-the-counter Tylenol and ibuprofen to be used as needed. Supportive care. Close monitoring.

## 2017-01-08 NOTE — Progress Notes (Signed)
    Subjective:  Patient ID: Ranae Palmsanielle E Cho, female    DOB: [birth year]  Age: 31 y.o. MRN: [id number]  CC: Cough  HPI:  31 year old female with cerebral palsy and seizure disorder presents for evaluation of the above.  History obtained via mother as patient is unable to give history.  Mother states that on Thursday she had a seizure. She had vomiting afterwards which is characteristic of her seizures. Mother states that afterwards she noticed that she was hoarse and she later experienced a cough. Tactile fever on Saturday. She gave her ibuprofen and Tylenol with improvement. She has not taken her temperature. Her cough is nonproductive. No known exacerbating factors. No reports of shortness of breath. No other associated symptoms. No other complaints or concerns at this time.  Social Hx   Social History   Social History  . Marital status: Single    Spouse name: N/A  . Number of children: N/A  . Years of education: N/A   Social History Main Topics  . Smoking status: Never Smoker  . Smokeless tobacco: Never Used  . Alcohol use No  . Drug use: No  . Sexual activity: Not Asked   Other Topics Concern  . None   Social History Narrative  . None    Review of Systems  HENT: Positive for voice change.   Respiratory: Positive for cough.   Neurological: Positive for seizures.   Objective:  BP 90/70 (BP Location: Left Arm, Patient Position: Sitting, Cuff Size: Normal)   Pulse 76   Temp 98.1 F (36.7 C) (Oral)   Wt 128 lb (58.1 kg)   LMP 01/09/2011   SpO2 98%   BMI 20.66 kg/m   BP/Weight 01/08/2017 12/06/2016 11/12/2016  Systolic BP 90 122 112  Diastolic BP 70 76 80  Wt. (Lbs) 128 127.8 127.6  BMI 20.66 20.63 20.6    Physical Exam  Constitutional:  Well-appearing, no acute distress.  HENT:  Head: Normocephalic and atraumatic.  Mouth/Throat: Oropharynx is clear and moist.  Eyes: Conjunctivae are normal.  Neck: Neck supple.  Cardiovascular: Normal rate and regular  rhythm.   No murmur heard. Pulmonary/Chest: Effort normal. No respiratory distress. She has no wheezes. She has no rales.  Lymphadenopathy:    She has no cervical adenopathy.  Neurological: She is alert.  Vitals reviewed.   Lab Results  Component Value Date   WBC 5.0 12/20/2015   HGB 14.0 12/20/2015   HCT 40.9 12/20/2015   PLT 257.0 12/20/2015   GLUCOSE 94 12/20/2015   ALT 17 12/20/2015   AST 21 12/20/2015   NA 137 12/20/2015   K 3.7 12/20/2015   CL 103 12/20/2015   CREATININE 0.71 12/20/2015   BUN 8 12/20/2015   CO2 25 12/20/2015   TSH 2.48 10/05/2014    Assessment & Plan:   Problem List Items Addressed This Visit    Cough    New acute problem. Acute uncomplicated illness. Exam unremarkable. We discussed obtaining an x-ray and mother elected to just monitor her as she is quite bothered by procedures including getting a chest x-ray. Advised over-the-counter Tylenol and ibuprofen to be used as needed. Supportive care. Close monitoring.        Follow-up: PRN  Everlene OtherJayce Lillyrose Reitan DO [facility] [place]

## 2017-01-08 NOTE — Patient Instructions (Signed)
Keep an eye on her.  Let us know if her cough persists.  Take care  Dr. Adriana Simasook

## 2017-01-19 ENCOUNTER — Emergency Department
Admission: EM | Admit: 2017-01-19 | Discharge: 2017-01-20 | Disposition: A | Discharge status: Home / Self Care | Payer: 59 | Attending: Emergency Medicine | Admitting: Emergency Medicine

## 2017-01-19 ENCOUNTER — Encounter: Payer: Self-pay | Admitting: Emergency Medicine

## 2017-01-19 ENCOUNTER — Emergency Department: Payer: 59

## 2017-01-19 DIAGNOSIS — G809 Cerebral palsy, unspecified: Secondary | ICD-10-CM | POA: Insufficient documentation

## 2017-01-19 DIAGNOSIS — R569 Unspecified convulsions: Secondary | ICD-10-CM

## 2017-01-19 DIAGNOSIS — Z9689 Presence of other specified functional implants: Secondary | ICD-10-CM | POA: Insufficient documentation

## 2017-01-19 DIAGNOSIS — Z79899 Other long term (current) drug therapy: Secondary | ICD-10-CM | POA: Diagnosis not present

## 2017-01-19 DIAGNOSIS — J9811 Atelectasis: Secondary | ICD-10-CM | POA: Diagnosis not present

## 2017-01-19 LAB — COMPREHENSIVE METABOLIC PANEL
ALK PHOS: 57 U/L (ref 38–126)
ALT: 12 U/L — ABNORMAL LOW (ref 14–54)
AST: 33 U/L (ref 15–41)
Albumin: 4.1 g/dL (ref 3.5–5.0)
Anion gap: 12 (ref 5–15)
BILIRUBIN TOTAL: 0.3 mg/dL (ref 0.3–1.2)
BUN: 12 mg/dL (ref 6–20)
CALCIUM: 8.9 mg/dL (ref 8.9–10.3)
CO2: 21 mmol/L — ABNORMAL LOW (ref 22–32)
Chloride: 103 mmol/L (ref 101–111)
Creatinine, Ser: 0.86 mg/dL (ref 0.44–1.00)
GFR calc Af Amer: >60 mL/min (ref >60)
Glucose, Bld: 102 mg/dL — ABNORMAL HIGH (ref 65–99)
POTASSIUM: 3.5 mmol/L (ref 3.5–5.1)
Sodium: 136 mmol/L (ref 135–145)
TOTAL PROTEIN: 7.1 g/dL (ref 6.5–8.1)

## 2017-01-19 LAB — CBC WITH DIFFERENTIAL/PLATELET
BASOS ABS: 0 10*3/uL (ref 0–0.1)
BASOS PCT: 1 %
EOS ABS: 0 10*3/uL (ref 0–0.7)
EOS PCT: 0 %
HCT: 41.1 % (ref 35.0–47.0)
Hemoglobin: 14.4 g/dL (ref 12.0–16.0)
LYMPHS PCT: 37 %
Lymphs Abs: 2 10*3/uL (ref 1.0–3.6)
MCH: 29.4 pg (ref 26.0–34.0)
MCHC: 35.1 g/dL (ref 32.0–36.0)
MCV: 83.9 fL (ref 80.0–100.0)
MONO ABS: 0.3 10*3/uL (ref 0.2–0.9)
Monocytes Relative: 6 %
Neutro Abs: 3.1 10*3/uL (ref 1.4–6.5)
Neutrophils Relative %: 56 %
PLATELETS: 216 10*3/uL (ref 150–440)
RBC: 4.9 MIL/uL (ref 3.80–5.20)
RDW: 12.6 % (ref 11.5–14.5)
WBC: 5.5 10*3/uL (ref 3.6–11.0)

## 2017-01-19 LAB — LACTIC ACID, PLASMA: LACTIC ACID, VENOUS: 5.9 mmol/L — AB (ref 0.5–1.9)

## 2017-01-19 MED ORDER — SODIUM CHLORIDE 0.9 % IV BOLUS (SEPSIS)
1000.0000 mL | Freq: Once | INTRAVENOUS | Status: AC
  Administered 2017-01-20: 1000 mL via INTRAVENOUS

## 2017-01-19 MED ORDER — ONDANSETRON HCL 4 MG/2ML IJ SOLN
INTRAMUSCULAR | Status: AC
  Administered 2017-01-19: 4 mg via INTRAVENOUS
  Filled 2017-01-19: qty 2

## 2017-01-19 MED ORDER — ONDANSETRON HCL 4 MG/2ML IJ SOLN
4.0000 mg | Freq: Once | INTRAMUSCULAR | Status: AC
  Administered 2017-01-19: 4 mg via INTRAVENOUS

## 2017-01-19 NOTE — ED Notes (Signed)
Primary RN Danelle Earthly informed of patients critical lab value and that this RN had notified Dr Zenda Alpers.

## 2017-01-19 NOTE — ED Notes (Signed)
Patient transported to CT 

## 2017-01-19 NOTE — ED Triage Notes (Signed)
Patient presents to Emergency Department via EMS with complaints of seizure, per mother reports: pt was sleeping and at 2208 "gran mal" seizure noted lasting 12 minutes.  Mother gave 32.5mg  Diastat per rectum.    Pt with hx of seizures and cerebral palsy.  Pt takes Lamictal and mother reports faithful to med regimen.   Pt appears postictal ST and clear lung sounds.  Seizure precautions in place.

## 2017-01-20 DIAGNOSIS — R569 Unspecified convulsions: Secondary | ICD-10-CM | POA: Diagnosis not present

## 2017-01-20 LAB — URINALYSIS, COMPLETE (UACMP) WITH MICROSCOPIC
Bilirubin Urine: NEGATIVE
GLUCOSE, UA: NEGATIVE mg/dL
Ketones, ur: NEGATIVE mg/dL
NITRITE: NEGATIVE
PH: 7 (ref 5.0–8.0)
Protein, ur: 30 mg/dL — AB
Specific Gravity, Urine: 1.017 (ref 1.005–1.030)

## 2017-01-20 LAB — LACTIC ACID, PLASMA: Lactic Acid, Venous: 1.6 mmol/L (ref 0.5–1.9)

## 2017-01-20 MED ORDER — SODIUM CHLORIDE 0.9 % IV SOLN
1000.0000 mg | Freq: Once | INTRAVENOUS | Status: AC
  Administered 2017-01-20: 1000 mg via INTRAVENOUS
  Filled 2017-01-20: qty 10

## 2017-01-20 MED ORDER — SODIUM CHLORIDE 0.9 % IV BOLUS (SEPSIS)
1000.0000 mL | Freq: Once | INTRAVENOUS | Status: AC
  Administered 2017-01-20: 1000 mL via INTRAVENOUS

## 2017-01-20 NOTE — ED Notes (Signed)
Pharm called for meds  

## 2017-01-20 NOTE — ED Notes (Signed)
Pt. Going home with mother and family. 

## 2017-01-20 NOTE — Discharge Instructions (Signed)
Please follow-up with your neurologist.

## 2017-01-20 NOTE — ED Provider Notes (Signed)
[facility] Emergency Department Provider Note   ____________________________________________   First MD Initiated Contact with Patient 01/19/17 2317     (approximate)  I have reviewed the triage vital signs and the nursing notes.   HISTORY  Chief Complaint Seizures    HPI Lindsey [name] is a 31 y.o. female Who comes into the hospital today after having a 13 minute seizure. The patient has a history of seizures but according to mom this was a very long seizure. The patient has been taking her medication and has not missed any doses. She has a neurologist that she sees at [facility]. The patient's last seizure was September 3 and it was only a few minutes. Mom states that she gave the patient 2 doses of rectal Diastat. Her first dose was 20 mg and her second dose was 12.5 mg. Mom states that the patient had a good day today. They had supper and ran some errands in the rain. She hasn't had any illness or any other complaints. She was in bed for about an hour when the seizure started. The patient currently has some nausea and vomiting which mom states is typical for her when she has a seizure. Mom states that her teeth were very clenched which was concerning. Mom states that they do not come to the hospital typically for seizures which is why this was more severe. She is here today for evaluation.   Past Medical History:  Diagnosis Date  . Epilepsy [facility]) age 51   negative metabolic workup has brain abnormalities by MRI (Dr. Lisabeth Devoid, [place])  . Insomnia   . Status post VNS (vagus nerve stimulator) placement 2000  . Temporal lobectomy behavior syndrome 1994   Right    Patient Active Problem List   Diagnosis Date Noted  . Cough 01/08/2017  . Constipation 11/10/2015  . Long-term use of high-risk medication 10/11/2015  . Xerosis of skin 12/12/2014  . Restless legs syndrome 07/12/2014  . Restlessness and agitation 09/10/2012  . Routine general medical examination at  a health care facility 06/08/2012  . Cerebral palsy ([place]) 05/08/2012  . Acneiform dermatitis 06/30/2011  . Seizure disorder, primary ([place]) 03/23/2011    Past Surgical History:  Procedure Laterality Date  . ABDOMINAL HYSTERECTOMY  2005  . BRAIN SURGERY     Temporal Lobe resection  . CRANIOTOMY FOR TEMPORAL LOBECTOMY    . TONSILLECTOMY AND ADENOIDECTOMY  age 40    Prior to Admission medications   Medication Sig Start Date End Date Taking? Authorizing Provider  diazepam (DIASTAT ACUDIAL) 10 MG GEL Place 20 mg rectally once.   Yes [provider]  diazepam (VALIUM) 5 MG tablet TAKE 1 AND A HALF (1.5) TABLETS BY MOUTHAT BEDTIME 01/01/17  Yes Crecencio Mc, MD  LAMICTAL 100 MG tablet Take 100 mg by mouth daily. Take three tablets two times daily. 08/03/16  Yes [provider]  pramipexole (MIRAPEX) 0.25 MG tablet TAKE THREE TABLETS BY MOUTH EVERY DAY 01/01/17  Yes Crecencio Mc, MD  risperiDONE (RISPERDAL) 0.5 MG tablet TAKE 1 TABLET BY MOUTH AT BEDTIME, MAY TAKE ADDITIONAL 1/2 TABLET IF NEEDED. 01/01/17  Yes Crecencio Mc, MD    Allergies Influenza vac split quad; Benadryl [diphenhydramine hcl]; and Zyrtec [cetirizine hcl]  History reviewed. No pertinent family history.  Social History Social History  Substance Use Topics  . Smoking status: Never Smoker  . Smokeless tobacco: Never Used  . Alcohol use No    Review of Systems  Constitutional: No fever/chills Eyes: No visual changes. ENT: No sore throat. Cardiovascular: Denies chest pain. Respiratory: Denies shortness of breath. Gastrointestinal: nausea and vomiting No abdominal pain.  No diarrhea.  No constipation. Genitourinary: Negative for dysuria. Musculoskeletal: Negative for back pain. Skin: Negative for rash. Neurological: seizure   ____________________________________________   PHYSICAL EXAM:  VITAL SIGNS: ED Triage Vitals  Enc Vitals Group     BP 01/19/17 2255 120/83     Pulse Rate  01/19/17 2255 (!) 109     Resp 01/19/17 2255 (!) 28     Temp 01/19/17 2255 98.1 F (36.7 C)     Temp Source 01/19/17 2255 Axillary     SpO2 01/19/17 2255 95 %     Weight 01/19/17 2256 128 lb (58.1 kg)     Height 01/19/17 2256 _0  (1.676 m)     Head Circumference --      Peak Flow --      Pain Score --      Pain Loc --      Pain Edu? --      Excl. in [place]? --     Constitutional: somnolent, Ill appearing and in moderatedistress. Eyes: Conjunctivae are normal. PERRL. EOMI. Head: Atraumatic. Nose: No congestion/rhinnorhea. Mouth/Throat: Mucous membranes are moist.  Oropharynx non-erythematous. Cardiovascular: Normal rate, regular rhythm. Grossly normal heart sounds.  Good peripheral circulation. Respiratory: Normal respiratory effort.  No retractions. Lungs CTAB. Gastrointestinal: Soft and nontender. No distention. Positive bowel sounds Musculoskeletal: No lower extremity tenderness nor edema.  Neurologic:  . No gross focal neurologic deficits are appreciated.  Skin:  Skin is warm, dry and intact.  Psychiatric: Mood and affect are normal.   ____________________________________________   LABS (all labs ordered are listed, but only abnormal results are displayed)  Labs Reviewed  COMPREHENSIVE METABOLIC PANEL - Abnormal; Notable for the following:       Result Value   CO2 21 (*)    Glucose, Bld 102 (*)    ALT 12 (*)    All other components within normal limits  LACTIC ACID, PLASMA - Abnormal; Notable for the following:    Lactic Acid, Venous 5.9 (*)    All other components within normal limits  URINALYSIS, COMPLETE (UACMP) WITH MICROSCOPIC - Abnormal; Notable for the following:    Color, Urine YELLOW (*)    APPearance CLOUDY (*)    Hgb urine dipstick MODERATE (*)    Protein, ur 30 (*)    Leukocytes, UA SMALL (*)    Bacteria, UA RARE (*)    Squamous Epithelial / LPF 0-5 (*)    All other components within normal limits  CBC WITH DIFFERENTIAL/PLATELET  LACTIC ACID, PLASMA    LAMOTRIGINE LEVEL   ____________________________________________  EKG  none ____________________________________________  RADIOLOGY  Ct Head Wo Contrast  Result Date: 01/20/2017 CLINICAL DATA:  Seizure.  History of cerebral palsy EXAM: CT HEAD WITHOUT CONTRAST TECHNIQUE: Contiguous axial images were obtained from the base of the skull through the vertex without intravenous contrast. COMPARISON:  October 15, 2010 FINDINGS: Brain: The patient has had previous resection of much of the right temporal lobe with extensive encephalomalacia in the right temporal region, stable. The ventricles are normal in size and configuration. There is no intracranial mass, hemorrhage, extra-axial fluid collection, or midline shift. Beyond the encephalomalacia right temporal region, there are no focal gray-white compartment lesions. There is no demonstrable acute infarct. Vascular: There is no appreciable hyperdense vessel. There is no abnormal vascular calcification. Skull: Evidence of previous  right temporal craniotomy. Bony calvarium otherwise intact. Sinuses/Orbits: There is mucosal thickening in several ethmoid air cells bilaterally. Other paranasal sinuses are clear. There is a prominent concha bullosa on the right, an anatomic variant. There is mild rightward deviation of the nasal septum. Orbits appear symmetric bilaterally. Other: Mastoid air cells are clear. IMPRESSION: 1. Encephalomalacia right temporal region from prior resection of much of the right temporal lobe. 2. Ventricles normal in size and configuration. No mass, hemorrhage, or extra-axial fluid collection. No acute infarct. 3. Mild mucosal thickening in several ethmoid air cells. Mild rightward deviation of nasal septum. Electronically Signed   By: Lowella Grip III M.D.   On: 01/20/2017 00:32   Dg Chest Portable 1 View  Result Date: 01/20/2017 CLINICAL DATA:  Seizure with hypoxia today. EXAM: PORTABLE CHEST 1 VIEW COMPARISON:  12/06/2016  FINDINGS: Heart is top normal. No aortic aneurysm. Vagus nerve stimulator is noted overlying the left lower thorax with lead along the left side of the neck to the C6 level. There is bibasilar atelectasis. No overt pulmonary edema, effusion or pneumothorax. IMPRESSION: Bibasilar atelectasis with borderline cardiomegaly. Vagus nerve stimulator is in place. Electronically Signed   By: Ashley Royalty M.D.   On: 01/20/2017 00:12    ____________________________________________   PROCEDURES  Procedure(s) performed: None  Procedures  Critical Care performed: No  ____________________________________________   INITIAL IMPRESSION / ASSESSMENT AND PLAN / ED COURSE  Pertinent labs & imaging results that were available during my care of the patient were reviewed by me and considered in my medical decision making (see chart for details).  This is a 31 year old female who comes into the hospital today after having a 13 minute long seizure. The patient had some blood work done prior to my arrival. It was found that the patient had a lactic acidosis of 5.9. I gave the patient a liter of normal saline. I feel that her lactic acidosis is due to her seizure. I will await the results of the urinalysis and reassess the patient.      the patient's imaging studies are unremarkable. Her urinalysis does not show any infection. Her initial lactic acid was 5.9 but I did give HER-2 liters of normal saline and then repeated it. The repeat lactic acid is 1.6. I feel that the patient's lactic acid is elevated due to her seizure. Mom reports that she feels that the patient will be much better at home. I will discharge the patient to follow-up with her neurologist at home. ____________________________________________   FINAL CLINICAL IMPRESSION(S) / ED DIAGNOSES  Final diagnoses:  Seizure ([place])      NEW MEDICATIONS STARTED DURING THIS VISIT:  New Prescriptions   No medications on file     Note:  This  document was prepared using Dragon voice recognition software and may include unintentional dictation errors.    Loney Hering, MD 01/20/17 [id number]

## 2017-01-21 ENCOUNTER — Telehealth: Payer: Self-pay | Admitting: Internal Medicine

## 2017-01-21 DIAGNOSIS — R112 Nausea with vomiting, unspecified: Secondary | ICD-10-CM

## 2017-01-21 MED ORDER — ONDANSETRON 4 MG PO TBDP: 4.0000 mg | ORAL_TABLET | Freq: Three times a day (TID) | ORAL | 5 refills | Status: DC | PRN

## 2017-01-21 NOTE — Telephone Encounter (Signed)
Carol from [facility] called and stated that she is coming up with an interaction between this and her risperiDONE (RISPERDAL) 0.5 MG tablet. Please advise, thank you!

## 2017-01-21 NOTE — Telephone Encounter (Signed)
Is it okay to refill the zofran?

## 2017-01-21 NOTE — Telephone Encounter (Signed)
Pt mom called and stated that pt was in the ED Saturday with a really bad seizure. They would like a refill on Zofran. Please advise, thank you!  Pharmacy - [facility] - [place], [place] - [address]  Call pt @ [phone]

## 2017-01-21 NOTE — Telephone Encounter (Signed)
zofran refilled.

## 2017-01-21 NOTE — Telephone Encounter (Signed)
Please advise 

## 2017-01-21 NOTE — Telephone Encounter (Signed)
Called [facility] and gave them the ok to go ahead and fill.

## 2017-01-21 NOTE — Telephone Encounter (Signed)
Patient has taken it before,  Mother has requested it.  Ok to fill

## 2017-01-22 ENCOUNTER — Encounter: Payer: Self-pay | Admitting: Internal Medicine

## 2017-01-22 ENCOUNTER — Ambulatory Visit (INDEPENDENT_AMBULATORY_CARE_PROVIDER_SITE_OTHER): Payer: 59 | Admitting: Internal Medicine

## 2017-01-22 DIAGNOSIS — B349 Viral infection, unspecified: Secondary | ICD-10-CM | POA: Diagnosis not present

## 2017-01-22 DIAGNOSIS — G40909 Epilepsy, unspecified, not intractable, without status epilepticus: Secondary | ICD-10-CM | POA: Diagnosis not present

## 2017-01-22 NOTE — Progress Notes (Signed)
Subjective:  Patient ID: Lindsey [name], female    DOB: 25-Nov-1985  Age: 31 y.o. MRN: [id number]  CC: Diagnoses of Viral infection and Seizure disorder, primary [facility]) were pertinent to this visit.  HPI \*   presents for follow up on ER visit for prolonged seizure.  ER visit [facility] Sept 15.  Seizure occurred at home while in bed ,  Lasted 13 minutes.  EMS called. Lactic acid level was elevated,  But normalized on repeat evaluation after two liter of NS.  I v Keppra 500 mg  and Zofran.     Workup for infection included negative chest x ry,  Negative urine, normal CBC     Had a low grade fever of 100. 5 this morning at ome,  Tylenol  given   Continues to feel a bit lethargic but denies hydusria, sore throat earp pain, caough and nausea,  Appetite good. ER records reviewed.     Outpatient Medications Prior to Visit  Medication Sig Dispense Refill  . diazepam (DIASTAT ACUDIAL) 10 MG GEL Place 20 mg rectally once.    . diazepam (VALIUM) 5 MG tablet TAKE 1 AND A HALF (1.5) TABLETS BY MOUTHAT BEDTIME 45 tablet 3  . LAMICTAL 100 MG tablet Take 100 mg by mouth daily. Take three tablets two times daily.    . ondansetron (ZOFRAN ODT) 4 MG disintegrating tablet Take 1 tablet (4 mg total) by mouth every 8 (eight) hours as needed for nausea or vomiting. 20 tablet 5  . pramipexole (MIRAPEX) 0.25 MG tablet TAKE THREE TABLETS BY MOUTH EVERY DAY 90 tablet 1  . risperiDONE (RISPERDAL) 0.5 MG tablet TAKE 1 TABLET BY MOUTH AT BEDTIME, MAY TAKE ADDITIONAL 1/2 TABLET IF NEEDED. 30 tablet 3   No facility-administered medications prior to visit.     Review of Systems;  Patient denies headache, fevers, malaise, unintentional weight loss, skin rash, eye pain, sinus congestion and sinus pain, sore throat, dysphagia,  hemoptysis , cough, dyspnea, wheezing, chest pain, palpitations, orthopnea, edema, abdominal pain, nausea, melena, diarrhea, constipation, flank pain, dysuria, hematuria, urinary  Frequency,  nocturia, numbness, tingling, seizures,  Focal weakness, Loss of consciousness,  Tremor, insomnia, depression, anxiety, and suicidal ideation.      Objective:  BP 98/66 (BP Location: Left Arm, Patient Position: Sitting, Cuff Size: Normal)   Pulse 94   Temp (!) 97.5 F (36.4 C) (Oral)   Resp 15   Ht  (1.676 m)   Wt 128 lb 12.8 oz (58.4 kg)   LMP 01/09/2011   SpO2 95%   BMI 20.79 kg/m   BP Readings from Last 3 Encounters:  01/22/17 98/66  01/20/17 97/64  01/08/17 90/70    Wt Readings from Last 3 Encounters:  01/22/17 128 lb 12.8 oz (58.4 kg)  01/19/17 128 lb (58.1 kg)  01/08/17 128 lb (58.1 kg)    General appearance: alert, cooperative and appears stated age Ears: normal TM's and external ear canals both ears Throat: lips, mucosa, and tongue normal; teeth and gums normal Neck: no adenopathy, no carotid bruit, supple, symmetrical, trachea midline and thyroid not enlarged, symmetric, no tenderness/mass/nodules Back: symmetric, no curvature. ROM normal. No CVA tenderness. Lungs: clear to auscultation bilaterally Heart: regular rate and rhythm, S1, S2 normal, no murmur, click, rub or gallop Abdomen: soft, non-tender; bowel sounds normal; no masses,  no organomegaly Pulses: 2+ and symmetric Skin: Skin color, texture, turgor normal. No rashes or lesions Lymph nodes: Cervical, supraclavicular, and axillary nodes normal.  No results  found for: HGBA1C  Lab Results  Component Value Date   CREATININE 0.86 01/19/2017   CREATININE 0.71 12/20/2015   CREATININE 0.68 10/18/2015    Lab Results  Component Value Date   WBC 5.5 01/19/2017   HGB 14.4 01/19/2017   HCT 41.1 01/19/2017   PLT 216 01/19/2017   GLUCOSE 102 (H) 01/19/2017   ALT 12 (L) 01/19/2017   AST 33 01/19/2017   NA 136 01/19/2017   K 3.5 01/19/2017   CL 103 01/19/2017   CREATININE 0.86 01/19/2017   BUN 12 01/19/2017   CO2 21 (L) 01/19/2017   TSH 2.48 10/05/2014    Ct Head Wo Contrast  Result Date:  01/20/2017 CLINICAL DATA:  Seizure.  History of cerebral palsy EXAM: CT HEAD WITHOUT CONTRAST TECHNIQUE: Contiguous axial images were obtained from the base of the skull through the vertex without intravenous contrast. COMPARISON:  October 15, 2010 FINDINGS: Brain: The patient has had previous resection of much of the right temporal lobe with extensive encephalomalacia in the right temporal region, stable. The ventricles are normal in size and configuration. There is no intracranial mass, hemorrhage, extra-axial fluid collection, or midline shift. Beyond the encephalomalacia right temporal region, there are no focal gray-white compartment lesions. There is no demonstrable acute infarct. Vascular: There is no appreciable hyperdense vessel. There is no abnormal vascular calcification. Skull: Evidence of previous right temporal craniotomy. Bony calvarium otherwise intact. Sinuses/Orbits: There is mucosal thickening in several ethmoid air cells bilaterally. Other paranasal sinuses are clear. There is a prominent concha bullosa on the right, an anatomic variant. There is mild rightward deviation of the nasal septum. Orbits appear symmetric bilaterally. Other: Mastoid air cells are clear. IMPRESSION: 1. Encephalomalacia right temporal region from prior resection of much of the right temporal lobe. 2. Ventricles normal in size and configuration. No mass, hemorrhage, or extra-axial fluid collection. No acute infarct. 3. Mild mucosal thickening in several ethmoid air cells. Mild rightward deviation of nasal septum. Electronically Signed   By: Bretta Bang III M.D.   On: 01/20/2017 00:32   Dg Chest Portable 1 View  Result Date: 01/20/2017 CLINICAL DATA:  Seizure with hypoxia today. EXAM: PORTABLE CHEST 1 VIEW COMPARISON:  12/06/2016 FINDINGS: Heart is top normal. No aortic aneurysm. Vagus nerve stimulator is noted overlying the left lower thorax with lead along the left side of the neck to the C6 level. There is  bibasilar atelectasis. No overt pulmonary edema, effusion or pneumothorax. IMPRESSION: Bibasilar atelectasis with borderline cardiomegaly. Vagus nerve stimulator is in place. Electronically Signed   By: Tollie Eth M.D.   On: 01/20/2017 00:12    Assessment & Plan:   Problem List Items Addressed This Visit    Seizure disorder, primary [facility])    With recent ER evaluation for prolonged episode,  Lactic acid was elevated initially but resolved.       Viral infection    Mild,  Unclear etiology. Supportive care outlined         I am having Ms. Heavin maintain her diazepam, LAMICTAL, pramipexole, risperiDONE, diazepam, and ondansetron.  No orders of the defined types were placed in this encounter.   There are no discontinued medications.  Follow-up: No Follow-up on file.   Sherlene Shams, MD

## 2017-01-23 LAB — LAMOTRIGINE LEVEL: Lamotrigine Lvl: 6.8 ug/mL (ref 2.0–20.0)

## 2017-01-24 NOTE — Assessment & Plan Note (Signed)
With recent ER evaluation for prolonged episode,  Lactic acid was elevated initially but resolved.

## 2017-01-24 NOTE — Assessment & Plan Note (Signed)
Mild,  Unclear etiology. Supportive care outlined

## 2017-02-05 ENCOUNTER — Encounter: Payer: Self-pay | Admitting: Internal Medicine

## 2017-02-05 ENCOUNTER — Ambulatory Visit (INDEPENDENT_AMBULATORY_CARE_PROVIDER_SITE_OTHER): Payer: 59 | Admitting: Internal Medicine

## 2017-02-05 DIAGNOSIS — G808 Other cerebral palsy: Secondary | ICD-10-CM | POA: Diagnosis not present

## 2017-02-05 DIAGNOSIS — L853 Xerosis cutis: Secondary | ICD-10-CM | POA: Diagnosis not present

## 2017-02-05 DIAGNOSIS — Z0000 Encounter for general adult medical examination without abnormal findings: Secondary | ICD-10-CM | POA: Diagnosis not present

## 2017-02-06 NOTE — Assessment & Plan Note (Addendum)
Annual comprehensive preventive exam was done as well as an evaluation and management of chronic conditions .  During the course of the visit the patient's mother  was educated and counseled about appropriate screening and preventive services including screening for diabetes screening, hyperlipidemia, and nutrition counseling.  Screening for breast, cervical and colorectal cancer screening has been deferred by mother.  recommended immunizations are up to date.   Printed recommendations for health maintenance screenings was given

## 2017-02-06 NOTE — Assessment & Plan Note (Signed)
She has a moderate  intellectual disability , attends afternoon daycare.

## 2017-02-06 NOTE — Assessment & Plan Note (Signed)
Reminded mother to apply moisturizer to skin post bath

## 2017-02-06 NOTE — Progress Notes (Signed)
Patient ID: Lindsey [name], female    DOB: 18-Oct-1985  Age: 31 y.o. MRN: [id number]  The patient is here for annual preventive examination and management of other chronic and acute problems.   The risk factors are reflected in the social history.  The roster of all physicians providing medical care to patient - is listed in the Snapshot section of the chart.  Activities of daily living:  The patient is cognitively impaired and able to dress and feed herself but is otherwise dependent upon her mother .   Home safety : The patient has smoke detectors in the home. They wear seatbelts.  There are no firearms at home. There is no violence in the home.   There is no risks for hepatitis, STDs or HIV. There is no   history of blood transfusion. They have no travel history to infectious disease endemic areas of the world.  The patient has seen their dentist in the last six month. They have seen their eye doctor in the last year.  Discussed the need for sun protection: hats, long sleeves and use of sunscreen if there is significant sun exposure.   Diet: the importance of a healthy diet is discussed. They do have a healthy diet.  The benefits of regular aerobic exercise were discussed. She walks 4 times per week ,  20 minutes.   Depression screen: there are no signs or vegative symptoms of depression- irritability, change in appetite, anhedonia, sadness/tearfullness.  Cognitive assessment: the patient is not able to manage their financial and personal affairs and is actively engaged. They could not relate day,date,year and events;  The following portions of the patient's history were reviewed and updated as appropriate: allergies, current medications, past family history, past medical history,  past surgical history, past social history  and problem list.  Visual acuity was not assessed per patient preference since she has regular follow up with her ophthalmologist. Hearing and body mass index were  assessed and reviewed.   During the course of the visit the patient 's mother was educated and counseled about appropriate screening and preventive services including : fall prevention , diabetes screening, nutrition counseling, colorectal cancer screening, and recommended immunizations.    CC: Diagnoses of Xerosis of skin, Routine general medical examination at a health care facility, and Other cerebral palsy [facility]) were pertinent to this visit.  History Lindsey [name] has a past medical history of Epilepsy (HCC) (age 48); Insomnia; Status post VNS (vagus nerve stimulator) placement (2000); and Temporal lobectomy behavior syndrome (1994).   She has a past surgical history that includes Tonsillectomy and adenoidectomy (age 71); Abdominal hysterectomy (2005); Brain surgery; and Craniotomy for temporal lobectomy.   Her family history is not on file.She reports that she has never smoked. She has never used smokeless tobacco. She reports that she does not drink alcohol or use drugs.  Outpatient Medications Prior to Visit  Medication Sig Dispense Refill  . diazepam (DIASTAT ACUDIAL) 10 MG GEL Place 20 mg rectally once.    . diazepam (VALIUM) 5 MG tablet TAKE 1 AND A HALF (1.5) TABLETS BY MOUTHAT BEDTIME 45 tablet 3  . LAMICTAL 100 MG tablet Take 100 mg by mouth daily. Take three tablets two times daily.    . ondansetron (ZOFRAN ODT) 4 MG disintegrating tablet Take 1 tablet (4 mg total) by mouth every 8 (eight) hours as needed for nausea or vomiting. 20 tablet 5  . pramipexole (MIRAPEX) 0.25 MG tablet TAKE THREE TABLETS BY MOUTH EVERY DAY  90 tablet 1  . risperiDONE (RISPERDAL) 0.5 MG tablet TAKE 1 TABLET BY MOUTH AT BEDTIME, MAY TAKE ADDITIONAL 1/2 TABLET IF NEEDED. 30 tablet 3   No facility-administered medications prior to visit.     Review of Systems   Mother provides history:Patient denies headache, fevers, malaise, unintentional weight loss, skin rash, eye pain, sinus congestion and sinus pain, sore  throat, dysphagia,  hemoptysis , cough, dyspnea, wheezing, chest pain, palpitations, orthopnea, edema, abdominal pain, nausea, melena, diarrhea, constipation, flank pain, dysuria, hematuria, urinary  Frequency, nocturia, numbness, tingling, seizures,  Focal weakness, Loss of consciousness,  Tremor, insomnia, depression, anxiety, and suicidal ideation.      Objective:  BP 114/70 (BP Location: Left Arm, Patient Position: Sitting, Cuff Size: Normal)   Pulse 79   Temp 97.6 F (36.4 C) (Oral)   Resp 16   Ht  (1.676 m)   Wt 125 lb 3.2 oz (56.8 kg)   LMP 01/09/2011   SpO2 96%   BMI 20.21 kg/m   Physical Exam   General appearance: alert, cooperative and appears younger than her stated stated age Ears: normal TM's and external ear canals both ears Throat: lips, mucosa, and tongue normal; teeth and gums normal Neck: no adenopathy, no carotid bruit, supple, symmetrical, trachea midline and thyroid not enlarged, symmetric, no tenderness/mass/nodules Back: symmetric, no curvature. ROM normal. No CVA tenderness. Lungs: clear to auscultation bilaterally Heart: regular rate and rhythm, S1, S2 normal, no murmur, click, rub or gallop Abdomen: soft, non-tender; bowel sounds normal; no masses,  no organomegaly Pulses: 2+ and symmetric Skin: Skin color, texture, turgor normal. No rashes or lesions Lymph nodes: Cervical, supraclavicular, and axillary nodes normal. Neuro: CNs 2-12 intact except speech is slurred. Marland Kitchen DTRs 2+/4 in biceps, brachioradialis, patellars and achilles. Muscle strength 5/5 in upper and lower exremities.  cerebellar function normal. Romberg negative.  No pronator drift.   Gait normal.     Assessment & Plan:   Problem List Items Addressed This Visit    Cerebral palsy (HCC)    She has a moderate  intellectual disability , attends afternoon daycare.        Routine general medical examination at a health care facility    Annual comprehensive preventive exam was done as well  as an evaluation and management of chronic conditions .  During the course of the visit the patient's mother  was educated and counseled about appropriate screening and preventive services including screening for diabetes screening, hyperlipidemia, and nutrition counseling.  Screening for breast, cervical and colorectal cancer screening has been deferred by mother.  recommended immunizations are up to date.   Printed recommendations for health maintenance screenings was given      Xerosis of skin    Reminded mother to apply moisturizer to skin post bath         I am having Ms. Bocanegra maintain her diazepam, LAMICTAL, pramipexole, risperiDONE, diazepam, and ondansetron.  No orders of the defined types were placed in this encounter.   There are no discontinued medications.  Follow-up: No Follow-up on file.   Sherlene Shams, MD

## 2017-02-10 DIAGNOSIS — H9203 Otalgia, bilateral: Secondary | ICD-10-CM | POA: Diagnosis not present

## 2017-02-13 ENCOUNTER — Ambulatory Visit (INDEPENDENT_AMBULATORY_CARE_PROVIDER_SITE_OTHER): Payer: 59 | Admitting: Family

## 2017-02-13 ENCOUNTER — Encounter: Payer: Self-pay | Admitting: Family

## 2017-02-13 VITALS — BP 100/60 | HR 82 | Temp 97.9°F | Ht 66.0 in | Wt 124.6 lb

## 2017-02-13 DIAGNOSIS — R05 Cough: Secondary | ICD-10-CM | POA: Diagnosis not present

## 2017-02-13 DIAGNOSIS — R059 Cough, unspecified: Secondary | ICD-10-CM

## 2017-02-13 NOTE — Patient Instructions (Signed)
As discussed, conservative measures at this time.Delsym, Sudafed if needed. Cool mist vaporizer,  plenty of fluids.  let me know if not better

## 2017-02-13 NOTE — Progress Notes (Signed)
Subjective:    Patient ID: Lindsey [name], female    DOB: July 06, 1985, 31 y.o.   MRN: [id number]  CC: Lindsey [name] is a 31 y.o. female who presents today for an acute visit.    HPI: CC: coughing spell this morning . Cough started 4 days ago, unchanged.   Seizure lasted couple of minutes, and resolved with diastat. 3 weeks ago, had 12 minute seizure in which she took her to ED.  Some sinus pressure. Ear is feeling better per patient. Hasn't tried any medications. In the past, Sudafed, Delsym works well. Unable to tolerate antihistamines, nasal sprays.  Mother with her. Mom notes dry , occasionally productive. No fever, wheezing, SOB.   Over weekend, treated for left 'swimmer's ear' and started on ear drop per mom ( unsure of name).   Per mom, when she is sick, gets too hot, not getting good sleep, triggers for seizures.   F/u appt with neurology next week.   No h/o asthma.     HISTORY:  Past Medical History:  Diagnosis Date  . Epilepsy [facility]) age 61   negative metabolic workup has brain abnormalities by MRI (Dr. Quintin Alto, [facility])  . Insomnia   . Status post VNS (vagus nerve stimulator) placement 2000  . Temporal lobectomy behavior syndrome 1994   Right   Past Surgical History:  Procedure Laterality Date  . ABDOMINAL HYSTERECTOMY  2005  . BRAIN SURGERY     Temporal Lobe resection  . CRANIOTOMY FOR TEMPORAL LOBECTOMY    . TONSILLECTOMY AND ADENOIDECTOMY  age 43   No family history on file.  Allergies: Influenza vac split quad; Benadryl [diphenhydramine hcl]; and Zyrtec [cetirizine hcl] Current Outpatient Prescriptions on File Prior to Visit  Medication Sig Dispense Refill  . diazepam (DIASTAT ACUDIAL) 10 MG GEL Place 20 mg rectally once.    . diazepam (VALIUM) 5 MG tablet TAKE 1 AND A HALF (1.5) TABLETS BY MOUTHAT BEDTIME 45 tablet 3  . LAMICTAL 100 MG tablet Take 100 mg by mouth daily. Take three tablets two times daily.    . ondansetron (ZOFRAN ODT) 4 MG  disintegrating tablet Take 1 tablet (4 mg total) by mouth every 8 (eight) hours as needed for nausea or vomiting. 20 tablet 5  . pramipexole (MIRAPEX) 0.25 MG tablet TAKE THREE TABLETS BY MOUTH EVERY DAY 90 tablet 1  . risperiDONE (RISPERDAL) 0.5 MG tablet TAKE 1 TABLET BY MOUTH AT BEDTIME, MAY TAKE ADDITIONAL 1/2 TABLET IF NEEDED. 30 tablet 3   No current facility-administered medications on file prior to visit.     Social History  Substance Use Topics  . Smoking status: Never Smoker  . Smokeless tobacco: Never Used  . Alcohol use No    Review of Systems  Constitutional: Negative for chills and fever.  HENT: Positive for congestion, ear pain and sinus pain. Negative for ear discharge and sore throat.   Respiratory: Positive for cough. Negative for shortness of breath and wheezing.   Cardiovascular: Negative for chest pain and palpitations.  Gastrointestinal: Negative for nausea and vomiting.  Neurological: Negative for headaches.      Objective:    BP 100/60   Pulse 82   Temp 97.9 F (36.6 C) (Oral)   Ht  (1.676 m)   Wt 124 lb 9 oz (56.5 kg)   LMP 01/09/2011   SpO2 94%   BMI 20.10 kg/m    Physical Exam  Constitutional: She appears well-developed and well-nourished.  HENT:  Head: Normocephalic  and atraumatic.  Right Ear: Hearing, tympanic membrane, external ear and ear canal normal. No drainage, swelling or tenderness. No foreign bodies. Tympanic membrane is not erythematous and not bulging. No middle ear effusion. No decreased hearing is noted.  Left Ear: Hearing, tympanic membrane, external ear and ear canal normal. No drainage, swelling or tenderness. No foreign bodies. Tympanic membrane is not erythematous and not bulging.  No middle ear effusion. No decreased hearing is noted.  Nose: Rhinorrhea present. Right sinus exhibits no maxillary sinus tenderness and no frontal sinus tenderness. Left sinus exhibits no maxillary sinus tenderness and no frontal sinus  tenderness.  Mouth/Throat: Uvula is midline, oropharynx is clear and moist and mucous membranes are normal. No oropharyngeal exudate, posterior oropharyngeal edema, posterior oropharyngeal erythema or tonsillar abscesses.  Eyes: Conjunctivae are normal.  Cardiovascular: Regular rhythm, normal heart sounds and normal pulses.   Pulmonary/Chest: Effort normal and breath sounds normal. She has no wheezes. She has no rhonchi. She has no rales.  Lymphadenopathy:       Head (right side): No submental, no submandibular, no tonsillar, no preauricular, no posterior auricular and no occipital adenopathy present.       Head (left side): No submental, no submandibular, no tonsillar, no preauricular, no posterior auricular and no occipital adenopathy present.    She has no cervical adenopathy.  Neurological: She is alert.  Skin: Skin is warm and dry.  Psychiatric: She has a normal mood and affect. Her speech is normal and behavior is normal. Thought content normal.  Vitals reviewed.      Assessment & Plan:   Problem List Items Addressed This Visit      Other   Cough - Primary    Duration 4 days, unchanged. Afebrile. Patient is well-appearing today. Mom declines CXR. Discussed with patient and mother conservative measures at home including Delsym, Sudafed if needed. Patient and mother will let me know if no improvement or if another episode of a seizure occurs. She has follow-up with her neurologist next week.            I am having Ms. Mulroy maintain her diazepam, LAMICTAL, pramipexole, risperiDONE, diazepam, and ondansetron.   No orders of the defined types were placed in this encounter.   Return precautions given.   Risks, benefits, and alternatives of the medications and treatment plan prescribed today were discussed, and patient expressed understanding.   Education regarding symptom management and diagnosis given to patient on AVS.  Continue to follow with Sherlene Shams, MD for  routine health maintenance.   Lindsey [name] and I agreed with plan.   Rennie Plowman, FNP

## 2017-02-13 NOTE — Progress Notes (Signed)
Pre visit review using our clinic review tool, if applicable. No additional management support is needed unless otherwise documented below in the visit note. 

## 2017-02-13 NOTE — Assessment & Plan Note (Addendum)
Duration 4 days, unchanged. Afebrile. Patient is well-appearing today. Mom declines CXR. Discussed with patient and mother conservative measures at home including Delsym, Sudafed if needed. Patient and mother will let me know if no improvement or if another episode of a seizure occurs. She has follow-up with her neurologist next week.

## 2017-02-25 DIAGNOSIS — J0140 Acute pansinusitis, unspecified: Secondary | ICD-10-CM | POA: Diagnosis not present

## 2017-03-11 ENCOUNTER — Ambulatory Visit: Admit: 2017-03-11 | Payer: 59 | Admitting: Gastroenterology

## 2017-03-11 SURGERY — COLONOSCOPY WITH PROPOFOL
Anesthesia: General

## 2017-03-27 ENCOUNTER — Other Ambulatory Visit: Payer: Self-pay | Admitting: Internal Medicine

## 2017-03-29 ENCOUNTER — Other Ambulatory Visit: Payer: Self-pay | Admitting: Internal Medicine

## 2017-05-04 ENCOUNTER — Other Ambulatory Visit: Payer: Self-pay | Admitting: Internal Medicine

## 2017-05-06 ENCOUNTER — Other Ambulatory Visit: Payer: Self-pay | Admitting: Internal Medicine

## 2017-05-06 NOTE — Telephone Encounter (Signed)
Refilled: 01/01/2017 Last OV: 02/05/2017 Next OV: 06/07/2017

## 2017-05-06 NOTE — Telephone Encounter (Signed)
Refilled: 04/01/2017 Last OV: 02/05/2017 Next OV: 06/07/2017

## 2017-05-22 ENCOUNTER — Other Ambulatory Visit: Payer: Self-pay | Admitting: Internal Medicine

## 2017-05-22 NOTE — Telephone Encounter (Signed)
Refilled: 01/01/2017 Last OV: 02/05/2017 Next OV: not scheduled

## 2017-06-07 ENCOUNTER — Ambulatory Visit: Payer: Self-pay | Admitting: Internal Medicine

## 2017-06-12 ENCOUNTER — Ambulatory Visit
Admission: RE | Admit: 2017-06-12 | Discharge: 2017-06-12 | Disposition: A | Discharge status: Home / Self Care | Payer: 59 | Source: Ambulatory Visit | Attending: Unknown Physician Specialty | Admitting: Unknown Physician Specialty

## 2017-06-12 ENCOUNTER — Other Ambulatory Visit: Payer: Self-pay | Admitting: Unknown Physician Specialty

## 2017-06-12 DIAGNOSIS — R059 Cough, unspecified: Secondary | ICD-10-CM

## 2017-06-12 DIAGNOSIS — R05 Cough: Secondary | ICD-10-CM | POA: Diagnosis not present

## 2017-06-19 ENCOUNTER — Encounter: Payer: Self-pay | Admitting: Internal Medicine

## 2017-06-20 ENCOUNTER — Other Ambulatory Visit: Payer: Self-pay | Admitting: Internal Medicine

## 2017-06-20 MED ORDER — LINACLOTIDE 72 MCG PO CAPS: 72.0000 ug | ORAL_CAPSULE | Freq: Every day | ORAL | 5 refills | Status: DC

## 2017-06-27 ENCOUNTER — Ambulatory Visit: Payer: Self-pay | Admitting: Internal Medicine

## 2017-07-22 ENCOUNTER — Encounter: Payer: Self-pay | Admitting: Family Medicine

## 2017-07-22 ENCOUNTER — Ambulatory Visit (INDEPENDENT_AMBULATORY_CARE_PROVIDER_SITE_OTHER): Payer: 59 | Admitting: Family Medicine

## 2017-07-22 ENCOUNTER — Telehealth: Payer: Self-pay

## 2017-07-22 VITALS — BP 116/68 | HR 91 | Temp 97.9°F | Wt 117.0 lb

## 2017-07-22 DIAGNOSIS — R634 Abnormal weight loss: Secondary | ICD-10-CM

## 2017-07-22 DIAGNOSIS — R05 Cough: Secondary | ICD-10-CM

## 2017-07-22 DIAGNOSIS — G40909 Epilepsy, unspecified, not intractable, without status epilepticus: Secondary | ICD-10-CM | POA: Diagnosis not present

## 2017-07-22 DIAGNOSIS — R059 Cough, unspecified: Secondary | ICD-10-CM

## 2017-07-22 DIAGNOSIS — K5900 Constipation, unspecified: Secondary | ICD-10-CM

## 2017-07-22 NOTE — Patient Instructions (Signed)
Lindsey [name] may have a viral upper respiratory infection. I don't think she has an ear infection or pneumonia.  May take delsym for cough as needed Push fluids and plenty of rest. Honey with lemon can help cough. Watch for fever >101, worsening productive cough, or just not improving as expected.  Call clinic with questions.  Good to see you today. I hope Lindsey [name] start feeling better soon.

## 2017-07-22 NOTE — Telephone Encounter (Signed)
Is there an appointment that Dr. Darrick Huntsmanullo would have this week?  Copied from CRM [id number]. Topic: Appointment Scheduling - Scheduling Inquiry for Clinic >> Jul 22, 2017  9:00 AM Landry MellowFoltz, Melissa J wrote: Reason for CRM: mom called asking to be worked in by Dr Darrick Huntsmanullo - pt is losing weight and not able to have many bowel movements.  Pt would like to be worked in, before her 04/23 appt. Please call [phone]

## 2017-07-22 NOTE — Assessment & Plan Note (Addendum)
Of 3d duration - anticipate viral URI process.  rec delsym, honey with lemon, continued ibuprofen and further supportive care Red flags to seek further care also reviewed.  Mom agrees with plan.

## 2017-07-22 NOTE — Progress Notes (Signed)
BP 116/68 (BP Location: Left Arm, Patient Position: Sitting, Cuff Size: Normal)   Pulse 91   Temp 97.9 F (36.6 C) (Axillary)   Wt 117 lb (53.1 kg)   LMP 01/09/2011   SpO2 97%   BMI 18.88 kg/m    CC: cough, L ear pain Subjective:    Patient ID: Lindsey [name], female    DOB: 02-Jul-1985, 32 y.o.   MRN: [id number]  HPI: Lindsey [name] is a 32 y.o. female presenting on 07/22/2017 for Cough (Dry cough about 2 days. ) and Ear Pain (Pain in left ear. Tried ibuprofen. )   Here with mom Lindsey [name].  Friday evening started complaining of L ear pain, next morning noted nonproductive cough and congestion.   Some constipation despite dulcolax and linzess - appetite ok.   Denies fever, ST, abd discomfort, headache, sinus pressure pain or head congestion/coryza or rhinorrhea.  No sick contacts at home.  No smokers at home.  No h/o asthma.  She's been taking ibuprofen with some improvement.   No seizures since October.   Relevant past medical, surgical, family and social history reviewed and updated as indicated. Interim medical history since our last visit reviewed. Allergies and medications reviewed - she is now on keppra unsure dosage - trying to taper off lamictal. Outpatient Medications Prior to Visit  Medication Sig Dispense Refill  . diazepam (DIASTAT ACUDIAL) 10 MG GEL Place 20 mg rectally once.    . diazepam (VALIUM) 5 MG tablet TAKE 1 AND 1/2 TABLET AT BEDTIME 45 tablet 3  . LAMICTAL 100 MG tablet Take 100 mg by mouth daily. Take three tablets two times daily.    . pramipexole (MIRAPEX) 0.25 MG tablet TAKE THREE TABLETS EVERY DAY 90 tablet 3  . risperiDONE (RISPERDAL) 0.5 MG tablet TAKE ONE TABLET BY MOUTH AT BEDTIME MAY TAKE ADDITIONAL 1/2 TABLET ASNEEDED 30 tablet 3  . linaclotide (LINZESS) 72 MCG capsule Take 1 capsule (72 mcg total) by mouth daily before breakfast. 30 capsule 5  . ondansetron (ZOFRAN ODT) 4 MG disintegrating tablet Take 1 tablet (4 mg total) by mouth every  8 (eight) hours as needed for nausea or vomiting. 20 tablet 5   No facility-administered medications prior to visit.      Per HPI unless specifically indicated in ROS section below Review of Systems     Objective:    BP 116/68 (BP Location: Left Arm, Patient Position: Sitting, Cuff Size: Normal)   Pulse 91   Temp 97.9 F (36.6 C) (Axillary)   Wt 117 lb (53.1 kg)   LMP 01/09/2011   SpO2 97%   BMI 18.88 kg/m   Wt Readings from Last 3 Encounters:  07/22/17 117 lb (53.1 kg)  02/13/17 124 lb 9 oz (56.5 kg)  02/05/17 125 lb 3.2 oz (56.8 kg)    Physical Exam  Constitutional: She appears well-developed and well-nourished. No distress.  HENT:  Head: Normocephalic and atraumatic.  Right Ear: Hearing, tympanic membrane, external ear and ear canal normal.  Left Ear: Hearing, tympanic membrane, external ear and ear canal normal.  Nose: No mucosal edema or rhinorrhea.  Mouth/Throat: Uvula is midline, oropharynx is clear and moist and mucous membranes are normal. No oropharyngeal exudate, posterior oropharyngeal edema, posterior oropharyngeal erythema or tonsillar abscesses.  TMs bilaterally pearly grey, good light reflex Nasal mucosal congestion  Eyes: Conjunctivae and EOM are normal. Pupils are equal, round, and reactive to light. No scleral icterus.  Neck: Normal range of motion. Neck supple.  Cardiovascular: Normal rate, regular rhythm, normal heart sounds and intact distal pulses.  No murmur heard. Pulmonary/Chest: Effort normal and breath sounds normal. No respiratory distress. She has no wheezes. She has no rales.  Musculoskeletal: She exhibits no edema.  Lymphadenopathy:    She has cervical adenopathy (R AC enlarged lymph node).  Skin: Skin is warm and dry. No rash noted.  Nursing note and vitals reviewed.     Assessment & Plan:   Problem List Items Addressed This Visit    Cough - Primary    Of 3d duration - anticipate viral URI process.  rec delsym, honey with lemon,  continued ibuprofen and further supportive care Red flags to seek further care also reviewed.  Mom agrees with plan.       Seizure disorder, primary (HCC)    No recurrent seizure since 02/2017 (since keppra was started)          No orders of the defined types were placed in this encounter.  No orders of the defined types were placed in this encounter.   Follow up plan: Return if symptoms worsen or fail to improve.  Lindsey BoydenJavier Akaila Rambo, MD

## 2017-07-22 NOTE — Assessment & Plan Note (Addendum)
No recurrent seizure since 02/2017 (since keppra was started)

## 2017-07-23 ENCOUNTER — Other Ambulatory Visit (INDEPENDENT_AMBULATORY_CARE_PROVIDER_SITE_OTHER): Payer: 59

## 2017-07-23 ENCOUNTER — Ambulatory Visit (INDEPENDENT_AMBULATORY_CARE_PROVIDER_SITE_OTHER): Payer: 59

## 2017-07-23 DIAGNOSIS — R634 Abnormal weight loss: Secondary | ICD-10-CM | POA: Diagnosis not present

## 2017-07-23 DIAGNOSIS — K5900 Constipation, unspecified: Secondary | ICD-10-CM | POA: Diagnosis not present

## 2017-07-23 DIAGNOSIS — R194 Change in bowel habit: Secondary | ICD-10-CM | POA: Diagnosis not present

## 2017-07-23 LAB — CBC WITH DIFFERENTIAL/PLATELET
BASOS ABS: 0 10*3/uL (ref 0.0–0.1)
Basophils Relative: 0.5 % (ref 0.0–3.0)
EOS ABS: 0 10*3/uL (ref 0.0–0.7)
Eosinophils Relative: 0 % (ref 0.0–5.0)
HCT: 38.9 % (ref 36.0–46.0)
Hemoglobin: 13.7 g/dL (ref 12.0–15.0)
LYMPHS ABS: 1.7 10*3/uL (ref 0.7–4.0)
LYMPHS PCT: 39.8 % (ref 12.0–46.0)
MCHC: 35.1 g/dL (ref 30.0–36.0)
MCV: 82.8 fl (ref 78.0–100.0)
Monocytes Absolute: 0.3 10*3/uL (ref 0.1–1.0)
Monocytes Relative: 7.4 % (ref 3.0–12.0)
NEUTROS ABS: 2.2 10*3/uL (ref 1.4–7.7)
NEUTROS PCT: 52.3 % (ref 43.0–77.0)
PLATELETS: 186 10*3/uL (ref 150.0–400.0)
RBC: 4.7 Mil/uL (ref 3.87–5.11)
RDW: 12.7 % (ref 11.5–15.5)
WBC: 4.3 10*3/uL (ref 4.0–10.5)

## 2017-07-23 LAB — COMPREHENSIVE METABOLIC PANEL
ALT: 9 U/L (ref 0–35)
AST: 11 U/L (ref 0–37)
Albumin: 4.3 g/dL (ref 3.5–5.2)
Alkaline Phosphatase: 48 U/L (ref 39–117)
BUN: 10 mg/dL (ref 6–23)
CHLORIDE: 103 meq/L (ref 96–112)
CO2: 25 meq/L (ref 19–32)
CREATININE: 0.67 mg/dL (ref 0.40–1.20)
Calcium: 9.7 mg/dL (ref 8.4–10.5)
GFR: 109.85 mL/min (ref >60.00)
GLUCOSE: 96 mg/dL (ref 70–99)
Potassium: 3.6 mEq/L (ref 3.5–5.1)
SODIUM: 136 meq/L (ref 135–145)
Total Bilirubin: 0.4 mg/dL (ref 0.2–1.2)
Total Protein: 6.9 g/dL (ref 6.0–8.3)

## 2017-07-23 LAB — TSH: TSH: 1.33 u[IU]/mL (ref 0.35–4.50)

## 2017-07-23 NOTE — Telephone Encounter (Signed)
Have her come in today for labs and x ray  AndI will see her tomorrow at 1 pm Needs plain abd film and CMET along with TSH prior to visit.

## 2017-07-23 NOTE — Telephone Encounter (Signed)
Is there somewhere this week that you would be willing to work pt in. Your schedule is pretty full this week. You may have an appt available this evening at 5pm if the 5pm scheduled pt does not show up. Sent you message on that pt as well.

## 2017-07-23 NOTE — Telephone Encounter (Signed)
Can you schedule pt for tomorrow at 1:00pm per Dr. Darrick Huntsmanullo. Pt's mother is already aware of appt date and time. Pt is coming in today for lab work and xray.

## 2017-07-23 NOTE — Addendum Note (Signed)
Addended by: Sherlene ShamsULLO, Shantell Belongia L on: 07/23/2017 01:19 PM   Modules accepted: Orders

## 2017-07-24 ENCOUNTER — Ambulatory Visit (INDEPENDENT_AMBULATORY_CARE_PROVIDER_SITE_OTHER): Payer: 59 | Admitting: Internal Medicine

## 2017-07-24 ENCOUNTER — Encounter: Payer: Self-pay | Admitting: Internal Medicine

## 2017-07-24 DIAGNOSIS — K5900 Constipation, unspecified: Secondary | ICD-10-CM

## 2017-07-24 DIAGNOSIS — R634 Abnormal weight loss: Secondary | ICD-10-CM | POA: Diagnosis not present

## 2017-07-24 MED ORDER — LINACLOTIDE 290 MCG PO CAPS: 290.0000 ug | ORAL_CAPSULE | Freq: Every day | ORAL | 3 refills | Status: DC

## 2017-07-24 MED ORDER — PANTOPRAZOLE SODIUM 40 MG PO TBEC: 40.0000 mg | DELAYED_RELEASE_TABLET | Freq: Every day | ORAL | 3 refills | Status: DC

## 2017-07-24 NOTE — Patient Instructions (Signed)
Trial of protonix for gastritis   One tablet daily in the morning   Linzess at highest dose 290 mcg daily for chronic constipation    If no improvement   Needs  to see GI for EGD/colonoscopy

## 2017-07-24 NOTE — Progress Notes (Signed)
Subjective:  Patient ID: Lindsey [name], female    DOB: [birth year]  Age: 32 y.o. MRN: [id number]  CC: Diagnoses of Constipation, unspecified constipation type and Weight loss, unintentional were pertinent to this visit.  HPI NAO LINZ presents for evaluation of ongoing weight loss and chronic constipation.  She is brought in by her mother, her primary caregiver.   Patient is a 32 yr old white female with cerebral palsy and seizure disorder who has lost 4 lbs since October 2018,  And 10 lbs over the past year.  She struggles with chronic constipation since childhood and Does not  move her bowels unless she is given dulcolax.  Averages a BM every other day.  Stools are normal caliber .  Patient is not able to provide history but per mother she has intermittently complained of abdominal pain  But has not had blood in stools. Her appetite has been normal but she prefers junk food and fast food over health foods.    Plain abdominal films were done today and were nonspecific, not concerning for for obstruction or severe constipation   tsh cmet and cbc normal.   Appetite is   good for  junk food and fast food       Outpatient Medications Prior to Visit  Medication Sig Dispense Refill  . diazepam (DIASTAT ACUDIAL) 10 MG GEL Place 20 mg rectally once.    . diazepam (VALIUM) 5 MG tablet TAKE 1 AND 1/2 TABLET AT BEDTIME 45 tablet 3  . LAMICTAL 100 MG tablet Take 100 mg by mouth daily. Take three tablets two times daily.    Marland Kitchen levETIRAcetam (KEPPRA) 750 MG tablet Take 750 mg by mouth 2 (two) times daily.    . pramipexole (MIRAPEX) 0.25 MG tablet TAKE THREE TABLETS EVERY DAY 90 tablet 3  . risperiDONE (RISPERDAL) 0.5 MG tablet TAKE ONE TABLET BY MOUTH AT BEDTIME MAY TAKE ADDITIONAL 1/2 TABLET ASNEEDED 30 tablet 3   No facility-administered medications prior to visit.     Review of Systems;  Patient denies headache, fevers, malaise, unintentional weight loss, skin rash, eye pain, sinus  congestion and sinus pain, sore throat, dysphagia,  hemoptysis , cough, dyspnea, wheezing, chest pain, palpitations, orthopnea, edema, abdominal pain, nausea, melena, diarrhea, flank pain, dysuria, hematuria, urinary  Frequency, nocturia, numbness, tingling, seizures,  Focal weakness, Loss of consciousness,  Tremor, insomnia, depression, anxiety, and suicidal ideation.      Objective:  BP 112/70 (BP Location: Left Arm, Patient Position: Sitting, Cuff Size: Normal)   Pulse 72   Temp (!) 97.5 F (36.4 C) (Oral)   Resp 15   Ht 5\' 6"  (1.676 m)   Wt 120 lb 9.6 oz (54.7 kg)   LMP 01/09/2011   SpO2 97%   BMI 19.47 kg/m   BP Readings from Last 3 Encounters:  07/24/17 112/70  07/22/17 116/68  02/13/17 100/60    Wt Readings from Last 3 Encounters:  07/24/17 120 lb 9.6 oz (54.7 kg)  07/22/17 117 lb (53.1 kg)  02/13/17 124 lb 9 oz (56.5 kg)    General appearance: alert, cooperative and appears younger than stated age Ears: normal TM's and external ear canals both ears Throat: lips, mucosa, and tongue normal; teeth and gums normal Neck: no adenopathy, no carotid bruit, supple, symmetrical, trachea midline and thyroid not enlarged, symmetric, no tenderness/mass/nodules Back: symmetric, no curvature. ROM normal. No CVA tenderness. Lungs: clear to auscultation bilaterally Heart: regular rate and rhythm, S1, S2 normal, no murmur, click,  rub or gallop Abdomen: soft, non-tender; bowel sounds normal; no masses,  no organomegaly Pulses: 2+ and symmetric Skin: Skin color, texture, turgor normal. No rashes or lesions Lymph nodes: Cervical, supraclavicular, and axillary nodes normal.  No results found for: HGBA1C  Lab Results  Component Value Date   CREATININE 0.67 07/23/2017   CREATININE 0.86 01/19/2017   CREATININE 0.71 12/20/2015    Lab Results  Component Value Date   WBC 4.3 07/23/2017   HGB 13.7 07/23/2017   HCT 38.9 07/23/2017   PLT 186.0 07/23/2017   GLUCOSE 96 07/23/2017    ALT 9 07/23/2017   AST 11 07/23/2017   NA 136 07/23/2017   K 3.6 07/23/2017   CL 103 07/23/2017   CREATININE 0.67 07/23/2017   BUN 10 07/23/2017   CO2 25 07/23/2017   TSH 1.33 07/23/2017    Dg Chest 2 View  Result Date: 06/12/2017 CLINICAL DATA:  Cough for 1 EXAM: CHEST  2 VIEW COMPARISON:  01/19/2017 FINDINGS: Cardiac shadow is within normal limits. A stimulating device is seen. The lungs are well aerated bilaterally. No focal infiltrate or sizable effusion is seen. No bony abnormality is noted. IMPRESSION: No active cardiopulmonary disease. Electronically Signed   By: Alcide CleverMark  Lukens M.D.   On: 06/12/2017 13:20    Assessment & Plan:   Problem List Items Addressed This Visit    Weight loss, unintentional    Her weight loss initially was intentional and necessary  in 2016 when she was obese and indulging in oatmeal cookies. However she has continued to lose weight unintentionally . Labs are normal.  Discussed CT chest abd and pelvis if trial of protonix and linzess does not resolve her abdominal pain and weight loss       Constipation    Previous trial of lunesta at lowest dose was ineffective.  Will increase dose to 290 mcg        A total of 25 minutes of face to face time was spent with patient more than half of which was spent in counselling about the above mentioned conditions  and coordination of care   I am having Jameriah E. Cavins start on linaclotide. I am also having her maintain her diazepam, LAMICTAL, pramipexole, diazepam, risperiDONE, levETIRAcetam, and pantoprazole.  Meds ordered this encounter  Medications  . linaclotide (LINZESS) 290 MCG CAPS capsule    Sig: Take 1 capsule (290 mcg total) by mouth daily before breakfast.    Dispense:  30 capsule    Refill:  3  . pantoprazole (PROTONIX) 40 MG tablet    Sig: Take 1 tablet (40 mg total) by mouth daily.    Dispense:  30 tablet    Refill:  3    There are no discontinued medications.  Follow-up: No follow-ups on  file.   Sherlene Shamseresa L Aily Tzeng, MD

## 2017-07-25 ENCOUNTER — Telehealth: Payer: Self-pay | Admitting: Internal Medicine

## 2017-07-25 NOTE — Telephone Encounter (Signed)
Signed and returned,   No charge  

## 2017-07-25 NOTE — Telephone Encounter (Signed)
A physicians's order was dropped off for Dr. Darrick Huntsmanullo to complete. Paper is up front in colored folder.

## 2017-07-25 NOTE — Telephone Encounter (Signed)
forl has been placed in quick sign folder.

## 2017-07-26 DIAGNOSIS — R634 Abnormal weight loss: Secondary | ICD-10-CM | POA: Insufficient documentation

## 2017-07-26 NOTE — Assessment & Plan Note (Signed)
Previous trial of lunesta at lowest dose was ineffective.  Will increase dose to 290 mcg

## 2017-07-26 NOTE — Assessment & Plan Note (Addendum)
Her weight loss initially was intentional and necessary  in 2016 when she was obese and indulging in oatmeal cookies. However she has continued to lose weight unintentionally . Labs are normal.  Discussed CT chest abd and pelvis if trial of protonix and linzess does not resolve her abdominal pain and weight loss

## 2017-07-29 DIAGNOSIS — H9209 Otalgia, unspecified ear: Secondary | ICD-10-CM | POA: Diagnosis not present

## 2017-07-29 NOTE — Telephone Encounter (Signed)
Mailed out on Friday

## 2017-08-13 ENCOUNTER — Ambulatory Visit (INDEPENDENT_AMBULATORY_CARE_PROVIDER_SITE_OTHER): Payer: 59 | Admitting: Internal Medicine

## 2017-08-13 ENCOUNTER — Encounter: Payer: Self-pay | Admitting: Internal Medicine

## 2017-08-13 ENCOUNTER — Other Ambulatory Visit: Payer: Self-pay

## 2017-08-13 DIAGNOSIS — R634 Abnormal weight loss: Secondary | ICD-10-CM | POA: Diagnosis not present

## 2017-08-13 DIAGNOSIS — K5900 Constipation, unspecified: Secondary | ICD-10-CM | POA: Diagnosis not present

## 2017-08-13 NOTE — Progress Notes (Signed)
Subjective:  Patient ID: Lindsey [name], female    DOB: 01-26-1986  Age: 32 y.o. MRN: [id number]  CC: Diagnoses of Weight loss, unintentional and Constipation, unspecified constipation type were pertinent to this visit.  HPI [name] presents for follow up on constipation and weight loss  issues . At last visit on March 29 plain films were done and linzess dose was increased to 290 mcg daily .  iniitally good results with first dose,  Not stooling only every other day with addition of dulcolax .    Drinks very little water,  But has decaf tea 48 ounces roughly. ,  Stools are softer,  No pain associated now with movement.    No weight loss over the last 3 weeks  2) subcutaneous lump on abdomen umbilicus.  Not fluctuant,  Just red.  Not tender  Present for weeks  Without chane.    Outpatient Medications Prior to Visit  Medication Sig Dispense Refill  . diazepam (DIASTAT ACUDIAL) 10 MG GEL Place 20 mg rectally once.    . diazepam (VALIUM) 5 MG tablet TAKE 1 AND 1/2 TABLET AT BEDTIME 45 tablet 3  . LAMICTAL 100 MG tablet Take 100 mg by mouth daily. Take three tablets two times daily.    Marland Kitchen levETIRAcetam (KEPPRA) 750 MG tablet Take 750 mg by mouth 2 (two) times daily.    Marland Kitchen linaclotide (LINZESS) 290 MCG CAPS capsule Take 1 capsule (290 mcg total) by mouth daily before breakfast. 30 capsule 3  . pantoprazole (PROTONIX) 40 MG tablet Take 1 tablet (40 mg total) by mouth daily. 30 tablet 3  . pramipexole (MIRAPEX) 0.25 MG tablet TAKE THREE TABLETS EVERY DAY 90 tablet 3  . risperiDONE (RISPERDAL) 0.5 MG tablet TAKE ONE TABLET BY MOUTH AT BEDTIME MAY TAKE ADDITIONAL 1/2 TABLET ASNEEDED 30 tablet 3   No facility-administered medications prior to visit.     Review of Systems;  Patient denies headache, fevers, malaise, unintentional weight loss, skin rash, eye pain, sinus congestion and sinus pain, sore throat, dysphagia,  hemoptysis , cough, dyspnea, wheezing, chest pain, palpitations,  orthopnea, edema, abdominal pain, nausea, melena, diarrhea, constipation, flank pain, dysuria, hematuria, urinary  Frequency, nocturia, numbness, tingling, seizures,  Focal weakness, Loss of consciousness,  Tremor, insomnia, depression, anxiety, and suicidal ideation.      Objective:  BP 110/82 (BP Location: Right Arm, Patient Position: Sitting, Cuff Size: Normal)   Pulse 96   Temp (!) 97.5 F (36.4 C)   Wt 120 lb 9.6 oz (54.7 kg)   LMP 01/09/2011   SpO2 98%   BMI 19.47 kg/m   BP Readings from Last 3 Encounters:  08/13/17 110/82  07/24/17 112/70  07/22/17 116/68    Wt Readings from Last 3 Encounters:  08/13/17 120 lb 9.6 oz (54.7 kg)  07/24/17 120 lb 9.6 oz (54.7 kg)  07/22/17 117 lb (53.1 kg)    General appearance: alert, cooperative and appears stated age Ears: normal TM's and external ear canals both ears Throat: lips, mucosa, and tongue normal; teeth and gums normal Neck: no adenopathy, no carotid bruit, supple, symmetrical, trachea midline and thyroid not enlarged, symmetric, no tenderness/mass/nodules Back: symmetric, no curvature. ROM normal. No CVA tenderness. Lungs: clear to auscultation bilaterally Heart: regular rate and rhythm, S1, S2 normal, no murmur, click, rub or gallop Abdomen: soft, non-tender; bowel sounds normal; no masses,  no organomegaly Pulses: 2+ and symmetric Skin: Skin color, texture, turgor normal. No rashes or lesions Lymph nodes: Cervical, supraclavicular,  and axillary nodes normal.  No results found for: HGBA1C  Lab Results  Component Value Date   CREATININE 0.67 07/23/2017   CREATININE 0.86 01/19/2017   CREATININE 0.71 12/20/2015    Lab Results  Component Value Date   WBC 4.3 07/23/2017   HGB 13.7 07/23/2017   HCT 38.9 07/23/2017   PLT 186.0 07/23/2017   GLUCOSE 96 07/23/2017   ALT 9 07/23/2017   AST 11 07/23/2017   NA 136 07/23/2017   K 3.6 07/23/2017   CL 103 07/23/2017   CREATININE 0.67 07/23/2017   BUN 10 07/23/2017    CO2 25 07/23/2017   TSH 1.33 07/23/2017    Dg Chest 2 View  Result Date: 06/12/2017 CLINICAL DATA:  Cough for 1 EXAM: CHEST  2 VIEW COMPARISON:  01/19/2017 FINDINGS: Cardiac shadow is within normal limits. A stimulating device is seen. The lungs are well aerated bilaterally. No focal infiltrate or sizable effusion is seen. No bony abnormality is noted. IMPRESSION: No active cardiopulmonary disease. Electronically Signed   By: Alcide CleverMark  Lukens M.D.   On: 06/12/2017 13:20    Assessment & Plan:   Problem List Items Addressed This Visit    Weight loss, unintentional    Her weight loss initially was intentional and necessary  in 2016 when she was obese and indulging in oatmeal cookies. However she has continued to lose weight unintentionally . Labs are normal.  Discussed CT chest abd and pelvis if trial of protonix and linzess does not resolve her abdominal pain and weight loss .  Since increasing the dose of linzess her weight has stabilized.       Constipation    Currently managed with linzess and dulcolax.  Encouraged  to add citrucel and high fiber breads         I am having Lindsey [name] maintain her diazepam, LAMICTAL, pramipexole, diazepam, risperiDONE, levETIRAcetam, linaclotide, and pantoprazole.  No orders of the defined types were placed in this encounter.   There are no discontinued medications.  Follow-up: Return in about 6 months (around 02/12/2018) for constipation,  .   Sherlene Shamseresa L Cruise Baumgardner, MD

## 2017-08-13 NOTE — Patient Instructions (Addendum)
It sounds like her constipation is well controlled on the current regimen and due in part to her food preferences.   Try adding a dose of citrucel to her tea,  And try adding a daly metamucil biscuits   Try the atkins and Quest bars that are high fiber as a daily treat.  The area  on her tummy looks like a sebaceous cyst ,  But it is not infected  If the skin arounds it becomes red,  Or if it becomes painful,  Call and I will start [name] on an antibiotic and a probiotic

## 2017-08-14 NOTE — Assessment & Plan Note (Signed)
Currently managed with linzess and dulcolax.  Encouraged  to add citrucel and high fiber breads

## 2017-08-14 NOTE — Assessment & Plan Note (Signed)
Her weight loss initially was intentional and necessary  in 2016 when she was obese and indulging in oatmeal cookies. However she has continued to lose weight unintentionally . Labs are normal.  Discussed CT chest abd and pelvis if trial of protonix and linzess does not resolve her abdominal pain and weight loss .  Since increasing the dose of linzess her weight has stabilized.

## 2017-08-26 ENCOUNTER — Other Ambulatory Visit: Payer: Self-pay | Admitting: Internal Medicine

## 2017-08-27 ENCOUNTER — Ambulatory Visit: Payer: 59 | Admitting: Internal Medicine

## 2017-09-02 ENCOUNTER — Encounter: Payer: Self-pay | Admitting: Internal Medicine

## 2017-09-02 ENCOUNTER — Ambulatory Visit (INDEPENDENT_AMBULATORY_CARE_PROVIDER_SITE_OTHER): Payer: 59 | Admitting: Internal Medicine

## 2017-09-02 VITALS — BP 122/76 | HR 83 | Temp 95.0°F | Ht 66.0 in | Wt 123.2 lb

## 2017-09-02 DIAGNOSIS — K089 Disorder of teeth and supporting structures, unspecified: Secondary | ICD-10-CM | POA: Diagnosis not present

## 2017-09-02 MED ORDER — AMOXICILLIN-POT CLAVULANATE 875-125 MG PO TABS: 1.0000 | ORAL_TABLET | Freq: Two times a day (BID) | ORAL | 0 refills | Status: DC

## 2017-09-02 NOTE — Progress Notes (Signed)
Pre visit review using our clinic review tool, if applicable. No additional management support is needed unless otherwise documented below in the visit note. 

## 2017-09-02 NOTE — Patient Instructions (Addendum)
Take Augmentin 2x per day x 10 days    Please call and schedule dental appt

## 2017-09-02 NOTE — Progress Notes (Signed)
Chief Complaint  Patient presents with  . Facial Swelling    left side no MOI   F/u  1. Left cheek facial swelling x < 1 week no pain mom is historian and c/w sinus issue and wants pt evaluated. No fever/chills  Review of Systems  Constitutional: Negative for chills, fever and weight loss.  HENT: Negative for hearing loss and sinus pain.   Skin: Negative for rash.   Past Medical History:  Diagnosis Date  . Epilepsy [facility]) age 32   negative metabolic workup has brain abnormalities by MRI (Dr. Quintin Alto, [facility])  . Insomnia   . Status post VNS (vagus nerve stimulator) placement 2000  . Temporal lobectomy behavior syndrome 1994   Right   Past Surgical History:  Procedure Laterality Date  . ABDOMINAL HYSTERECTOMY  2005  . BRAIN SURGERY     Temporal Lobe resection  . CRANIOTOMY FOR TEMPORAL LOBECTOMY    . TONSILLECTOMY AND ADENOIDECTOMY  age 89   History reviewed. No pertinent family history. Social History   Socioeconomic History  . Marital status: Single    Spouse name: Not on file  . Number of children: Not on file  . Years of education: Not on file  . Highest education level: Not on file  Occupational History  . Not on file  Social Needs  . Financial resource strain: Not on file  . Food insecurity:    Worry: Not on file    Inability: Not on file  . Transportation needs:    Medical: Not on file    Non-medical: Not on file  Tobacco Use  . Smoking status: Never Smoker  . Smokeless tobacco: Never Used  Substance and Sexual Activity  . Alcohol use: No    Alcohol/week: 0.0 oz  . Drug use: No  . Sexual activity: Not on file  Lifestyle  . Physical activity:    Days per week: Not on file    Minutes per session: Not on file  . Stress: Not on file  Relationships  . Social connections:    Talks on phone: Not on file    Gets together: Not on file    Attends religious service: Not on file    Active member of club or organization: Not on file    Attends meetings of clubs  or organizations: Not on file    Relationship status: Not on file  . Intimate partner violence:    Fear of current or ex partner: Not on file    Emotionally abused: Not on file    Physically abused: Not on file    Forced sexual activity: Not on file  Other Topics Concern  . Not on file  Social History Narrative   Lives with mother   Current Meds  Medication Sig  . diazepam (DIASTAT ACUDIAL) 10 MG GEL Place 20 mg rectally once.  . diazepam (VALIUM) 5 MG tablet TAKE 1 AND 1/2 TABLET AT BEDTIME  . LAMICTAL 100 MG tablet Take 100 mg by mouth daily. Take three tablets two times daily.  Marland Kitchen levETIRAcetam (KEPPRA) 750 MG tablet Take 750 mg by mouth 2 (two) times daily.  Marland Kitchen linaclotide (LINZESS) 290 MCG CAPS capsule Take 1 capsule (290 mcg total) by mouth daily before breakfast.  . pantoprazole (PROTONIX) 40 MG tablet Take 1 tablet (40 mg total) by mouth daily.  . pramipexole (MIRAPEX) 0.25 MG tablet TAKE THREE TABLETS EVERY DAY  . risperiDONE (RISPERDAL) 0.5 MG tablet TAKE 1 TABLET BY MOUTH AT BEDTIME, MAY  TAKE ADDITIONAL 1/2 TABLET AS NEEDED   Allergies  Allergen Reactions  . Influenza Vac Split Quad Other (See Comments)    48 hours of fever, myalgias   . Benadryl [Diphenhydramine Hcl]     Seizure  . Zyrtec [Cetirizine Hcl]     Seizure    Recent Results (from the past 2160 hour(s))  CBC with Differential/Platelet     Status: None   Collection Time: 07/23/17  1:58 PM  Result Value Ref Range   WBC 4.3 4.0 - 10.5 K/uL   RBC 4.70 3.87 - 5.11 Mil/uL   Hemoglobin 13.7 12.0 - 15.0 g/dL   HCT 16.1 09.6 - 04.5 %   MCV 82.8 78.0 - 100.0 fl   MCHC 35.1 30.0 - 36.0 g/dL   RDW 40.9 81.1 - 91.4 %   Platelets 186.0 150.0 - 400.0 K/uL   Neutrophils Relative % 52.3 43.0 - 77.0 %   Lymphocytes Relative 39.8 12.0 - 46.0 %   Monocytes Relative 7.4 3.0 - 12.0 %   Eosinophils Relative 0.0 0.0 - 5.0 %   Basophils Relative 0.5 0.0 - 3.0 %   Neutro Abs 2.2 1.4 - 7.7 K/uL   Lymphs Abs 1.7 0.7 - 4.0  K/uL   Monocytes Absolute 0.3 0.1 - 1.0 K/uL   Eosinophils Absolute 0.0 0.0 - 0.7 K/uL   Basophils Absolute 0.0 0.0 - 0.1 K/uL  TSH     Status: None   Collection Time: 07/23/17  1:58 PM  Result Value Ref Range   TSH 1.33 0.35 - 4.50 uIU/mL  Comprehensive metabolic panel     Status: None   Collection Time: 07/23/17  1:58 PM  Result Value Ref Range   Sodium 136 135 - 145 mEq/L   Potassium 3.6 3.5 - 5.1 mEq/L   Chloride 103 96 - 112 mEq/L   CO2 25 19 - 32 mEq/L   Glucose, Bld 96 70 - 99 mg/dL   BUN 10 6 - 23 mg/dL   Creatinine, Ser 7.82 0.40 - 1.20 mg/dL   Total Bilirubin 0.4 0.2 - 1.2 mg/dL   Alkaline Phosphatase 48 39 - 117 U/L   AST 11 0 - 37 U/L   ALT 9 0 - 35 U/L   Total Protein 6.9 6.0 - 8.3 g/dL   Albumin 4.3 3.5 - 5.2 g/dL   Calcium 9.7 8.4 - 95.6 mg/dL   GFR 213.08 >65.78 mL/min   Objective  Body mass index is 19.89 kg/m. Wt Readings from Last 3 Encounters:  09/02/17 123 lb 3.2 oz (55.9 kg)  08/13/17 120 lb 9.6 oz (54.7 kg)  07/24/17 120 lb 9.6 oz (54.7 kg)   Temp Readings from Last 3 Encounters:  09/02/17 (!) 95 F (35 C) (Oral)  08/13/17 (!) 97.5 F (36.4 C)  07/24/17 (!) 97.5 F (36.4 C) (Oral)   BP Readings from Last 3 Encounters:  09/02/17 122/76  08/13/17 110/82  07/24/17 112/70   Pulse Readings from Last 3 Encounters:  09/02/17 83  08/13/17 96  07/24/17 72    Physical Exam  Constitutional: She is oriented to person, place, and time. Vital signs are normal. She appears well-developed and well-nourished. She is cooperative.  HENT:  Head: Normocephalic and atraumatic.  Nose: Right sinus exhibits no maxillary sinus tenderness and no frontal sinus tenderness. Left sinus exhibits no maxillary sinus tenderness and no frontal sinus tenderness.  Mouth/Throat: Oropharynx is clear and moist and mucous membranes are normal.  Poor dentition no obvious abscess   Eyes:  Pupils are equal, round, and reactive to light. Conjunctivae are normal.  Cardiovascular:  Normal rate, regular rhythm and normal heart sounds.  Pulmonary/Chest: Effort normal and breath sounds normal.  Neurological: She is alert and oriented to person, place, and time. Gait normal.  Skin: Skin is warm, dry and intact.  Seb derm to scalp, face  Psychiatric: She has a normal mood and affect. Her speech is normal and behavior is normal. Judgment and thought content normal. Cognition and memory are normal.  Nursing note and vitals reviewed.   Assessment   1. Left cheek swelling likely related to poor dentition w/o obvious abscess. No sinus ttp on exam  Plan   1 augmentin bid x 10 days  rec f/u Dr. Vilinda Flake [facility]   Provider: Dr. French Ana McLean-Scocuzza-Internal Medicine

## 2017-09-03 ENCOUNTER — Other Ambulatory Visit: Payer: Self-pay | Admitting: Internal Medicine

## 2017-09-04 NOTE — Telephone Encounter (Signed)
Refilled: 05/06/2017 Last OV: 08/13/2017 Next OV: 02/06/2018

## 2017-09-04 NOTE — Telephone Encounter (Signed)
Printed, signed and faxed.  

## 2017-10-02 ENCOUNTER — Ambulatory Visit (INDEPENDENT_AMBULATORY_CARE_PROVIDER_SITE_OTHER): Payer: 59 | Admitting: Primary Care

## 2017-10-02 ENCOUNTER — Encounter: Payer: Self-pay | Admitting: Primary Care

## 2017-10-02 VITALS — BP 110/64 | HR 99 | Temp 98.2°F | Ht 66.0 in | Wt 120.2 lb

## 2017-10-02 DIAGNOSIS — J309 Allergic rhinitis, unspecified: Secondary | ICD-10-CM | POA: Diagnosis not present

## 2017-10-02 NOTE — Patient Instructions (Signed)
Your symptoms are related to allergies.  Continue Sudafed as discussed.  If you develop a cough then Delsym or Robitussin are appropriate.   Please notify me if you develop persistent fevers of 101, notice increased fatigue or weakness, or feel worse after 1 week of onset of symptoms.   Increase consumption of water intake and rest.  It was a pleasure meeting you!  Allergic Rhinitis, Adult Allergic rhinitis is an allergic reaction that affects the mucous membrane inside the nose. It causes sneezing, a runny or stuffy nose, and the feeling of mucus going down the back of the throat (postnasal drip). Allergic rhinitis can be mild to severe. There are two types of allergic rhinitis:  Seasonal. This type is also called hay fever. It happens only during certain seasons.  Perennial. This type can happen at any time of the year.  What are the causes? This condition happens when the body's defense system (immune system) responds to certain harmless substances called allergens as though they were germs.  Seasonal allergic rhinitis is triggered by pollen, which can come from grasses, trees, and weeds. Perennial allergic rhinitis may be caused by:  House dust mites.  Pet dander.  Mold spores.  What are the signs or symptoms? Symptoms of this condition include:  Sneezing.  Runny or stuffy nose (nasal congestion).  Postnasal drip.  Itchy nose.  Tearing of the eyes.  Trouble sleeping.  Daytime sleepiness.  How is this diagnosed? This condition may be diagnosed based on:  Your medical history.  A physical exam.  Tests to check for related conditions, such as: ? Asthma. ? Pink eye. ? Ear infection. ? Upper respiratory infection.  Tests to find out which allergens trigger your symptoms. These may include skin or blood tests.  How is this treated? There is no cure for this condition, but treatment can help control symptoms. Treatment may include:  Taking medicines that  block allergy symptoms, such as antihistamines. Medicine may be given as a shot, nasal spray, or pill.  Avoiding the allergen.  Desensitization. This treatment involves getting ongoing shots until your body becomes less sensitive to the allergen. This treatment may be done if other treatments do not help.  If taking medicine and avoiding the allergen does not work, new, stronger medicines may be prescribed.  Follow these instructions at home:  Find out what you are allergic to. Common allergens include smoke, dust, and pollen.  Avoid the things you are allergic to. These are some things you can do to help avoid allergens: ? Replace carpet with wood, tile, or vinyl flooring. Carpet can trap dander and dust. ? Do not smoke. Do not allow smoking in your home. ? Change your heating and air conditioning filter at least once a month. ? During allergy season:  Keep windows closed as much as possible.  Plan outdoor activities when pollen counts are lowest. This is usually during the evening hours.  When coming indoors, change clothing and shower before sitting on furniture or bedding.  Take over-the-counter and prescription medicines only as told by your health care provider.  Keep all follow-up visits as told by your health care provider. This is important. Contact a health care provider if:  You have a fever.  You develop a persistent cough.  You make whistling sounds when you breathe (you wheeze).  Your symptoms interfere with your normal daily activities. Get help right away if:  You have shortness of breath. Summary  This condition can be managed by  taking medicines as directed and avoiding allergens.  Contact your health care provider if you develop a persistent cough or fever.  During allergy season, keep windows closed as much as possible. This information is not intended to replace advice given to you by your health care provider. Make sure you discuss any questions you  have with your health care provider. Document Released: 01/16/2001 Document Revised: 05/31/2016 Document Reviewed: 05/31/2016 Elsevier Interactive Patient Education  [employer].

## 2017-10-02 NOTE — Progress Notes (Signed)
Subjective:    Patient ID: [name], female    DOB: February 11, 1986, 32 y.o.   MRN: [id number]  HPI  Ms. Rehm is a 32 year old female with a history of cerebral palsy, seizure disorder who presents today with a chief complaint of sore throat. Her mother is with her today who is providing information for the HPI.   She also reports right ear pain, cough,  rhinorrhea, voice hoarseness, sneezing, watery eyes. Her symptoms began last night. She's taken Sudafed and Ibuprofen with some improvement.  She denies fevers, wheezing.   Review of Systems  Constitutional: Negative for fever.  HENT: Positive for congestion, ear pain, postnasal drip, rhinorrhea and voice change. Negative for sinus pressure and sore throat.   Eyes: Positive for itching.  Respiratory: Positive for cough. Negative for wheezing.   Allergic/Immunologic: Positive for environmental allergies.       Past Medical History:  Diagnosis Date  . Epilepsy [facility]) age 10   negative metabolic workup has brain abnormalities by MRI (Dr. Quintin Alto, [facility])  . Insomnia   . Status post VNS (vagus nerve stimulator) placement 2000  . Temporal lobectomy behavior syndrome 1994   Right     Social History   Socioeconomic History  . Marital status: Single    Spouse name: Not on file  . Number of children: Not on file  . Years of education: Not on file  . Highest education level: Not on file  Occupational History  . Not on file  Social Needs  . Financial resource strain: Not on file  . Food insecurity:    Worry: Not on file    Inability: Not on file  . Transportation needs:    Medical: Not on file    Non-medical: Not on file  Tobacco Use  . Smoking status: Never Smoker  . Smokeless tobacco: Never Used  Substance and Sexual Activity  . Alcohol use: No    Alcohol/week: 0.0 oz  . Drug use: No  . Sexual activity: Not on file  Lifestyle  . Physical activity:    Days per week: Not on file    Minutes per session: Not on file    . Stress: Not on file  Relationships  . Social connections:    Talks on phone: Not on file    Gets together: Not on file    Attends religious service: Not on file    Active member of club or organization: Not on file    Attends meetings of clubs or organizations: Not on file    Relationship status: Not on file  . Intimate partner violence:    Fear of current or ex partner: Not on file    Emotionally abused: Not on file    Physically abused: Not on file    Forced sexual activity: Not on file  Other Topics Concern  . Not on file  Social History Narrative   Lives with mother    Past Surgical History:  Procedure Laterality Date  . ABDOMINAL HYSTERECTOMY  2005  . BRAIN SURGERY     Temporal Lobe resection  . CRANIOTOMY FOR TEMPORAL LOBECTOMY    . TONSILLECTOMY AND ADENOIDECTOMY  age 66    No family history on file.  Allergies  Allergen Reactions  . Influenza Vac Split Quad Other (See Comments)    48 hours of fever, myalgias   . Benadryl [Diphenhydramine Hcl]     Seizure  . Zyrtec [Cetirizine Hcl]     Seizure  Current Outpatient Medications on File Prior to Visit  Medication Sig Dispense Refill  . diazepam (DIASTAT ACUDIAL) 10 MG GEL Place 20 mg rectally once.    . diazepam (VALIUM) 5 MG tablet TAKE 1 & 1/2 TABLET BY MOUTH AT BEDTIME 45 tablet 5  . LAMICTAL 100 MG tablet Take 100 mg by mouth 2 (two) times daily.     Marland Kitchen levETIRAcetam (KEPPRA) 750 MG tablet Take 750 mg by mouth 2 (two) times daily.    Marland Kitchen linaclotide (LINZESS) 290 MCG CAPS capsule Take 1 capsule (290 mcg total) by mouth daily before breakfast. 30 capsule 3  . pantoprazole (PROTONIX) 40 MG tablet Take 1 tablet (40 mg total) by mouth daily. 30 tablet 3  . pramipexole (MIRAPEX) 0.25 MG tablet TAKE THREE TABLETS EVERY DAY 90 tablet 3  . risperiDONE (RISPERDAL) 0.5 MG tablet TAKE 1 TABLET BY MOUTH AT BEDTIME, MAY TAKE ADDITIONAL 1/2 TABLET AS NEEDED 30 tablet 2   No current facility-administered medications on  file prior to visit.     BP 110/64   Pulse 99   Temp 98.2 F (36.8 C) (Tympanic)   Ht  (1.676 m)   Wt 120 lb 4 oz (54.5 kg)   LMP 01/09/2011   SpO2 97%   BMI 19.41 kg/m    Objective:   Physical Exam  Constitutional: She appears well-nourished. She does not appear ill.  HENT:  Right Ear: Tympanic membrane and ear canal normal.  Left Ear: Tympanic membrane and ear canal normal.  Nose: Mucosal edema present. Right sinus exhibits no maxillary sinus tenderness and no frontal sinus tenderness. Left sinus exhibits no maxillary sinus tenderness and no frontal sinus tenderness.  Mouth/Throat: Oropharynx is clear and moist.  Whitish mucous to posterior pharynx   Neck: Neck supple.  Cardiovascular: Normal rate and regular rhythm.  Respiratory: Effort normal and breath sounds normal. She has no wheezes.  Skin: Skin is warm and dry.           Assessment & Plan:  Allergic Rhinitis:  Sneezing, rhinorrhea, itchy/watery eyes, post nasal drip x 12 hours.  Exam today with evidence of environmental allergy involvement. She cannot take antihistamines given seizure history. Continue with plain sudafed OTC and Ibuprofen. Discussed use of Delsym if needed. Return precautions provided.   Doreene Nest, NP

## 2017-10-05 DIAGNOSIS — J209 Acute bronchitis, unspecified: Secondary | ICD-10-CM | POA: Diagnosis not present

## 2017-10-05 DIAGNOSIS — J019 Acute sinusitis, unspecified: Secondary | ICD-10-CM | POA: Diagnosis not present

## 2017-10-07 ENCOUNTER — Other Ambulatory Visit: Payer: Self-pay | Admitting: Internal Medicine

## 2017-10-07 NOTE — Telephone Encounter (Signed)
Refilled: 05/06/2017 Last OV: 08/13/2017 Next OV: 02/06/2018

## 2017-10-14 ENCOUNTER — Ambulatory Visit (INDEPENDENT_AMBULATORY_CARE_PROVIDER_SITE_OTHER): Payer: 59

## 2017-10-14 ENCOUNTER — Ambulatory Visit (INDEPENDENT_AMBULATORY_CARE_PROVIDER_SITE_OTHER): Payer: 59 | Admitting: Internal Medicine

## 2017-10-14 ENCOUNTER — Encounter: Payer: Self-pay | Admitting: Internal Medicine

## 2017-10-14 VITALS — BP 108/68 | HR 73 | Temp 97.9°F | Ht 66.0 in | Wt 120.0 lb

## 2017-10-14 DIAGNOSIS — R05 Cough: Secondary | ICD-10-CM | POA: Diagnosis not present

## 2017-10-14 DIAGNOSIS — R059 Cough, unspecified: Secondary | ICD-10-CM

## 2017-10-14 NOTE — Patient Instructions (Signed)
Please use nebulizer for cough and continue mucinex I will let you know about chest Xray   Cough, Adult Coughing is a reflex that clears your throat and your airways. Coughing helps to heal and protect your lungs. It is normal to cough occasionally, but a cough that happens with other symptoms or lasts a long time may be a sign of a condition that needs treatment. A cough may last only 2-3 weeks (acute), or it may last longer than 8 weeks (chronic). What are the causes? Coughing is commonly caused by:  Breathing in substances that irritate your lungs.  A viral or bacterial respiratory infection.  Allergies.  Asthma.  Postnasal drip.  Smoking.  Acid backing up from the stomach into the esophagus (gastroesophageal reflux).  Certain medicines.  Chronic lung problems, including COPD (or rarely, lung cancer).  Other medical conditions such as heart failure.  Follow these instructions at home: Pay attention to any changes in your symptoms. Take these actions to help with your discomfort:  Take medicines only as told by your health care provider. ? If you were prescribed an antibiotic medicine, take it as told by your health care provider. Do not stop taking the antibiotic even if you start to feel better. ? Talk with your health care provider before you take a cough suppressant medicine.  Drink enough fluid to keep your urine clear or pale yellow.  If the air is dry, use a cold steam vaporizer or humidifier in your bedroom or your home to help loosen secretions.  Avoid anything that causes you to cough at work or at home.  If your cough is worse at night, try sleeping in a semi-upright position.  Avoid cigarette smoke. If you smoke, quit smoking. If you need help quitting, ask your health care provider.  Avoid caffeine.  Avoid alcohol.  Rest as needed.  Contact a health care provider if:  You have new symptoms.  You cough up pus.  Your cough does not get better after  2-3 weeks, or your cough gets worse.  You cannot control your cough with suppressant medicines and you are losing sleep.  You develop pain that is getting worse or pain that is not controlled with pain medicines.  You have a fever.  You have unexplained weight loss.  You have night sweats. Get help right away if:  You cough up blood.  You have difficulty breathing.  Your heartbeat is very fast. This information is not intended to replace advice given to you by your health care provider. Make sure you discuss any questions you have with your health care provider. Document Released: 10/20/2010 Document Revised: 09/29/2015 Document Reviewed: 06/30/2014 Elsevier Interactive Patient Education  [employer]2018 [employer]

## 2017-10-14 NOTE — Progress Notes (Signed)
Pre visit review using our clinic review tool, if applicable. No additional management support is needed unless otherwise documented below in the visit note. 

## 2017-10-14 NOTE — Progress Notes (Signed)
Chief Complaint  Patient presents with  . Follow-up   F/u history through mother as  Pt has CP 1. Cough x 2 weeks and fever 100.3 she just completed 1 week of doxycycline 100 mg bid last thurs still with clear sputum and cough and sneezing last Friday she was really sick. Mom has given Mucinex DM, Tylenol and ibuprofen last week q2 hours to help and with h/o seizures none since 02/2017 she wants to make sure she is ok and going to beach Friday   Review of Systems  Constitutional: Negative for weight loss.  HENT:       +sneezing   Respiratory: Positive for cough and sputum production.   Gastrointestinal:       Appetite normal    Past Medical History:  Diagnosis Date  . Epilepsy [facility]) age 18   negative metabolic workup has brain abnormalities by MRI (Dr. Quintin Altoadtke, [facility])  . Insomnia   . Status post VNS (vagus nerve stimulator) placement 2000  . Temporal lobectomy behavior syndrome 1994   Right   Past Surgical History:  Procedure Laterality Date  . ABDOMINAL HYSTERECTOMY  2005  . BRAIN SURGERY     Temporal Lobe resection  . CRANIOTOMY FOR TEMPORAL LOBECTOMY    . TONSILLECTOMY AND ADENOIDECTOMY  age 89   No family history on file. Social History   Socioeconomic History  . Marital status: Single    Spouse name: Not on file  . Number of children: Not on file  . Years of education: Not on file  . Highest education level: Not on file  Occupational History  . Not on file  Social Needs  . Financial resource strain: Not on file  . Food insecurity:    Worry: Not on file    Inability: Not on file  . Transportation needs:    Medical: Not on file    Non-medical: Not on file  Tobacco Use  . Smoking status: Never Smoker  . Smokeless tobacco: Never Used  Substance and Sexual Activity  . Alcohol use: No    Alcohol/week: 0.0 oz  . Drug use: No  . Sexual activity: Not on file  Lifestyle  . Physical activity:    Days per week: Not on file    Minutes per session: Not on file  .  Stress: Not on file  Relationships  . Social connections:    Talks on phone: Not on file    Gets together: Not on file    Attends religious service: Not on file    Active member of club or organization: Not on file    Attends meetings of clubs or organizations: Not on file    Relationship status: Not on file  . Intimate partner violence:    Fear of current or ex partner: Not on file    Emotionally abused: Not on file    Physically abused: Not on file    Forced sexual activity: Not on file  Other Topics Concern  . Not on file  Social History Narrative   Lives with mother   Current Meds  Medication Sig  . diazepam (DIASTAT ACUDIAL) 10 MG GEL Place 20 mg rectally once.  . diazepam (VALIUM) 5 MG tablet TAKE 1 & 1/2 TABLET BY MOUTH AT BEDTIME  . LAMICTAL 100 MG tablet Take 100 mg by mouth 2 (two) times daily.   Marland Kitchen. levETIRAcetam (KEPPRA) 750 MG tablet Take 750 mg by mouth 2 (two) times daily.  Marland Kitchen. linaclotide (LINZESS) 290 MCG CAPS  capsule Take 1 capsule (290 mcg total) by mouth daily before breakfast.  . pantoprazole (PROTONIX) 40 MG tablet Take 1 tablet (40 mg total) by mouth daily.  . pramipexole (MIRAPEX) 0.25 MG tablet TAKE THREE TABLETS EVERY DAY  . risperiDONE (RISPERDAL) 0.5 MG tablet TAKE 1 TABLET BY MOUTH AT BEDTIME, MAY TAKE ADDITIONAL 1/2 TABLET AS NEEDED   Allergies  Allergen Reactions  . Influenza Vac Split Quad Other (See Comments)    48 hours of fever, myalgias   . Benadryl [Diphenhydramine Hcl]     Seizure  . Zyrtec [Cetirizine Hcl]     Seizure    Recent Results (from the past 2160 hour(s))  CBC with Differential/Platelet     Status: None   Collection Time: 07/23/17  1:58 PM  Result Value Ref Range   WBC 4.3 4.0 - 10.5 K/uL   RBC 4.70 3.87 - 5.11 Mil/uL   Hemoglobin 13.7 12.0 - 15.0 g/dL   HCT 16.1 09.6 - 04.5 %   MCV 82.8 78.0 - 100.0 fl   MCHC 35.1 30.0 - 36.0 g/dL   RDW 40.9 81.1 - 91.4 %   Platelets 186.0 150.0 - 400.0 K/uL   Neutrophils Relative % 52.3  43.0 - 77.0 %   Lymphocytes Relative 39.8 12.0 - 46.0 %   Monocytes Relative 7.4 3.0 - 12.0 %   Eosinophils Relative 0.0 0.0 - 5.0 %   Basophils Relative 0.5 0.0 - 3.0 %   Neutro Abs 2.2 1.4 - 7.7 K/uL   Lymphs Abs 1.7 0.7 - 4.0 K/uL   Monocytes Absolute 0.3 0.1 - 1.0 K/uL   Eosinophils Absolute 0.0 0.0 - 0.7 K/uL   Basophils Absolute 0.0 0.0 - 0.1 K/uL  TSH     Status: None   Collection Time: 07/23/17  1:58 PM  Result Value Ref Range   TSH 1.33 0.35 - 4.50 uIU/mL  Comprehensive metabolic panel     Status: None   Collection Time: 07/23/17  1:58 PM  Result Value Ref Range   Sodium 136 135 - 145 mEq/L   Potassium 3.6 3.5 - 5.1 mEq/L   Chloride 103 96 - 112 mEq/L   CO2 25 19 - 32 mEq/L   Glucose, Bld 96 70 - 99 mg/dL   BUN 10 6 - 23 mg/dL   Creatinine, Ser 7.82 0.40 - 1.20 mg/dL   Total Bilirubin 0.4 0.2 - 1.2 mg/dL   Alkaline Phosphatase 48 39 - 117 U/L   AST 11 0 - 37 U/L   ALT 9 0 - 35 U/L   Total Protein 6.9 6.0 - 8.3 g/dL   Albumin 4.3 3.5 - 5.2 g/dL   Calcium 9.7 8.4 - 95.6 mg/dL   GFR 213.08 >65.78 mL/min   Objective  Body mass index is 19.37 kg/m. Wt Readings from Last 3 Encounters:  10/14/17 120 lb (54.4 kg)  10/02/17 120 lb 4 oz (54.5 kg)  09/02/17 123 lb 3.2 oz (55.9 kg)   Temp Readings from Last 3 Encounters:  10/14/17 97.9 F (36.6 C) (Oral)  10/02/17 98.2 F (36.8 C) (Tympanic)  09/02/17 (!) 95 F (35 C) (Oral)   BP Readings from Last 3 Encounters:  10/14/17 108/68  10/02/17 110/64  09/02/17 122/76   Pulse Readings from Last 3 Encounters:  10/14/17 73  10/02/17 99  09/02/17 83    Physical Exam  Constitutional: She is oriented to person, place, and time. Vital signs are normal. She appears well-developed and well-nourished. She is cooperative.  HENT:  Head: Normocephalic and atraumatic.  Mouth/Throat: Oropharynx is clear and moist and mucous membranes are normal.  Eyes: Pupils are equal, round, and reactive to light. Conjunctivae are normal.   Cardiovascular: Normal rate, regular rhythm and normal heart sounds.  Pulmonary/Chest: Effort normal and breath sounds normal.  Course breath sounds noted on exam   Neurological: She is alert and oriented to person, place, and time. Gait normal.  Skin: Skin is warm, dry and intact.  Psychiatric: She has a normal mood and affect. Her speech is normal and behavior is normal. Judgment and thought content normal. Cognition and memory are impaired.  Cognitive impairment 2/2 CP  Nursing note and vitals reviewed.   Assessment   1. Cough x 2 weeks r/o pneumonia  Plan  1. cxr today  Cont mucinex dm add neb treatment at home mother has medication  Completed doxy 100 bid x 1 week last Thursday  Consider add antihistamine qhs Provider: Dr. French Ana McLean-Scocuzza-[facility]

## 2017-10-15 ENCOUNTER — Other Ambulatory Visit: Payer: Self-pay

## 2017-10-15 MED ORDER — PANTOPRAZOLE SODIUM 40 MG PO TBEC: 40.0000 mg | DELAYED_RELEASE_TABLET | Freq: Every day | ORAL | 5 refills | Status: DC

## 2017-10-15 MED ORDER — LINACLOTIDE 290 MCG PO CAPS: 290.0000 ug | ORAL_CAPSULE | Freq: Every day | ORAL | 5 refills | Status: DC

## 2017-10-29 DIAGNOSIS — L0390 Cellulitis, unspecified: Secondary | ICD-10-CM | POA: Diagnosis not present

## 2017-10-29 DIAGNOSIS — M26609 Unspecified temporomandibular joint disorder, unspecified side: Secondary | ICD-10-CM | POA: Diagnosis not present

## 2017-11-10 DIAGNOSIS — S80219A Abrasion, unspecified knee, initial encounter: Secondary | ICD-10-CM | POA: Diagnosis not present

## 2017-11-10 DIAGNOSIS — S9031XA Contusion of right foot, initial encounter: Secondary | ICD-10-CM | POA: Diagnosis not present

## 2017-11-15 ENCOUNTER — Other Ambulatory Visit: Payer: Self-pay | Admitting: Internal Medicine

## 2017-11-18 ENCOUNTER — Ambulatory Visit: Payer: 59 | Admitting: Internal Medicine

## 2017-12-11 DIAGNOSIS — S63501A Unspecified sprain of right wrist, initial encounter: Secondary | ICD-10-CM | POA: Diagnosis not present

## 2018-01-02 ENCOUNTER — Other Ambulatory Visit: Payer: Self-pay | Admitting: Internal Medicine

## 2018-01-07 NOTE — Telephone Encounter (Signed)
Refilled: 10/08/2017 Last OV: 08/13/2017 Next OV: 02/06/2018

## 2018-01-13 DIAGNOSIS — H9209 Otalgia, unspecified ear: Secondary | ICD-10-CM | POA: Diagnosis not present

## 2018-01-28 ENCOUNTER — Telehealth: Payer: Self-pay | Admitting: *Deleted

## 2018-01-28 ENCOUNTER — Ambulatory Visit: Payer: 59 | Admitting: Internal Medicine

## 2018-01-28 NOTE — Telephone Encounter (Signed)
FYI.Marland Kitchen.Marland Kitchen.Pt's mother canceled appt for today because pt is feeling better. Pt has a follow up scheduled in October.

## 2018-01-28 NOTE — Telephone Encounter (Signed)
Copied from CRM [id number]. Topic: Quick Communication - Appointment Cancellation >> Jan 28, 2018  7:39 AM Leafy Roobinson, Norma J wrote: Patient called to cancel appointment scheduled for tullo . Patient  HAS [id number]} rescheduled their appointment. Pt feeling better  department's PEC pool.

## 2018-02-06 ENCOUNTER — Encounter: Payer: Self-pay | Admitting: Internal Medicine

## 2018-02-06 ENCOUNTER — Ambulatory Visit: Payer: 59 | Admitting: Internal Medicine

## 2018-02-06 ENCOUNTER — Ambulatory Visit (INDEPENDENT_AMBULATORY_CARE_PROVIDER_SITE_OTHER): Payer: 59 | Admitting: Internal Medicine

## 2018-02-06 VITALS — BP 108/66 | HR 83 | Temp 97.5°F | Resp 16 | Ht 66.0 in | Wt 129.4 lb

## 2018-02-06 DIAGNOSIS — G808 Other cerebral palsy: Secondary | ICD-10-CM

## 2018-02-06 DIAGNOSIS — Z0000 Encounter for general adult medical examination without abnormal findings: Secondary | ICD-10-CM | POA: Diagnosis not present

## 2018-02-06 DIAGNOSIS — R634 Abnormal weight loss: Secondary | ICD-10-CM

## 2018-02-06 DIAGNOSIS — G40909 Epilepsy, unspecified, not intractable, without status epilepticus: Secondary | ICD-10-CM

## 2018-02-06 DIAGNOSIS — K5900 Constipation, unspecified: Secondary | ICD-10-CM

## 2018-02-06 DIAGNOSIS — G2581 Restless legs syndrome: Secondary | ICD-10-CM

## 2018-02-06 NOTE — Progress Notes (Signed)
Patient ID: Lindsey [name], female    DOB: 10-Jan-1986  Age: 32 y.o. MRN: [id number]  The patient is here for annual  examination .  She is accompanied by her mother who provies 100% of history and states that there are no uncontrolled chronic  Or acute problems.   The risk factors are reflected in the social history.  The roster of all physicians providing medical care to patient - is listed in the Snapshot section of the chart.  Activities of daily living:  The patient is not independent in all ADLs.  She has cerebral palsy and lives with her mother who provides meal preparation and : dressing, toileting,.  She can self feed and can ambulate independently   Home safety : The patient has smoke detectors in the home. They wear seatbelts.  There are no firearms at home. There is no violence in the home.   There is no risks for hepatitis, STDs or HIV. There is no   history of blood transfusion. They have no travel history to infectious disease endemic areas of the world.  The patient has seen their dentist in the last six month. They have an eye appointment at [facility] next week,.  She has had prior surgery for "lazy eye".   She does not  have excessive sun exposure. Discussed the need for sun protection: hats, long sleeves and use of sunscreen if there is significant sun exposure.   Diet: the importance of a healthy diet is discussed. Her diet includes cheeseburgers and milkshakes from [employer] several times per week per patient preference. She refuses to eat a more  healthy diet.  The benefits of regular aerobic exercise were discussed. She does not exercise regularly  Depression screen: there are no signs or vegative symptoms of depression- irritability, change in appetite, anhedonia, sadness/tearfullness.  Cognitive assessment: the patient cannot manage any of  her financial and personal affairs and is actively engaged. They could  Not relate day,date,year and events or draw a clock face.   The  following portions of the patient's history were reviewed and updated as appropriate: allergies, current medications, past family history, past medical history,  past surgical history, past social history  and problem list.  Visual acuity was not assessed per patient preference since she has regular follow up with her ophthalmologist. Hearing and body mass index were assessed and reviewed.   During the course of the visit the patient was educated and counseled about appropriate screening and preventive services including : fall prevention , diabetes screening, nutrition counseling, colorectal cancer screening, and recommended immunizations.    CC: The primary encounter diagnosis was Routine general medical examination at a health care facility. Diagnoses of Weight loss, unintentional, Seizure disorder, primary (HCC), Constipation, unspecified constipation type, Other cerebral palsy (HCC), and Restless legs syndrome were also pertinent to this visit.  1) constipation has improved and patient's appetite has improved.  She has gained 9 lbs since last visit.   History Kambrea has a past medical history of Epilepsy (HCC) (age 68), Insomnia, Status post VNS (vagus nerve stimulator) placement (2000), and Temporal lobectomy behavior syndrome (1994).   She has a past surgical history that includes Tonsillectomy and adenoidectomy (age 61); Abdominal hysterectomy (2005); Brain surgery; and Craniotomy for temporal lobectomy.   Her family history is not on file.She reports that she has never smoked. She has never used smokeless tobacco. She reports that she does not drink alcohol or use drugs.  Outpatient Medications Prior to Visit  Medication Sig Dispense Refill  . diazepam (DIASTAT ACUDIAL) 10 MG GEL Place 20 mg rectally once.    . diazepam (VALIUM) 5 MG tablet TAKE 1 & 1/2 TABLET BY MOUTH AT BEDTIME 45 tablet 5  . LAMICTAL 100 MG tablet Take 100 mg by mouth 2 (two) times daily.     Marland Kitchen levETIRAcetam  (KEPPRA) 750 MG tablet Take 750 mg by mouth 2 (two) times daily.    Marland Kitchen linaclotide (LINZESS) 290 MCG CAPS capsule Take 1 capsule (290 mcg total) by mouth daily before breakfast. 30 capsule 5  . pantoprazole (PROTONIX) 40 MG tablet Take 1 tablet (40 mg total) by mouth daily. 30 tablet 5  . pramipexole (MIRAPEX) 0.25 MG tablet TAKE THREE TABLETS EVERY DAY 90 tablet 3  . risperiDONE (RISPERDAL) 0.5 MG tablet TAKE 1 TABLET BY MOUTH AT BEDTIME, MAY TAKE ADDITIONAL 1/2 TABLET AS NEEDED. 30 tablet 2   No facility-administered medications prior to visit.     Review of Systems   Per her mother,  Patient has not complained of  headache, fevers, malaise, unintentional weight loss, skin rash, eye pain, sinus congestion and sinus pain, sore throat, dysphagia,  hemoptysis , cough, dyspnea, wheezing, chest pain, palpitations, orthopnea, edema, abdominal pain, nausea, melena, diarrhea, constipation, flank pain, dysuria, hematuria, urinary  Frequency, nocturia, numbness, tingling, seizures,  Focal weakness, Loss of consciousness,  Tremor, insomnia, depression, anxiety, and suicidal ideation.      Objective:  BP 108/66 (BP Location: Left Arm, Patient Position: Sitting, Cuff Size: Normal)   Pulse 83   Temp (!) 97.5 F (36.4 C) (Oral)   Resp 16   Ht 5\' 6"  (1.676 m)   Wt 129 lb 6.4 oz (58.7 kg)   LMP 01/09/2011   SpO2 98%   BMI 20.89 kg/m   Physical Exam   General appearance: alert, cooperative and appears stated age Ears: normal TM's and external ear canals both ears Throat: lips, mucosa, and tongue normal; teeth and gums normal Neck: no adenopathy, no carotid bruit, supple, symmetrical, trachea midline and thyroid not enlarged, symmetric, no tenderness/mass/nodules Back: symmetric, no curvature. ROM normal. No CVA tenderness. Lungs: clear to auscultation bilaterally Heart: regular rate and rhythm, S1, S2 normal, no murmur, click, rub or gallop Abdomen: soft, non-tender; bowel sounds normal; no  masses,  no organomegaly Pulses: 2+ and symmetric Skin: Skin color, texture, turgor normal. No rashes or lesions Lymph nodes: Cervical, supraclavicular, and axillary nodes normal.    Assessment & Plan:   Problem List Items Addressed This Visit    Cerebral palsy (HCC)    She has a moderate  intellectual disability , attends afternoon daycare.   Her emotional lability is managed with risperidone      Constipation    Currently managed with linzess and rare use of dulcolax.        Restless legs syndrome    Managed with Mirapex . No changes today       Routine general medical examination at a health care facility - Primary    Annual comprehensive preventive exam was done as well as an evaluation and management of chronic conditions .  During the course of the visit the patient was educated and counseled about appropriate screening and preventive services including :  diabetes screening, lipid analysis with projected  10 year  risk for CAD , nutrition counseling,  and recommended immunizations.  Printed recommendations for health maintenance screenings was given      Seizure disorder, primary (HCC)  Controlled with addition of Keppra added in 02/2017       Weight loss, unintentional    Resolved with correction of constipation resulting in regain of appetite.  She has gained 9 lbs since last visit.          I am having Shalese E. Palmeri maintain her diazepam, LAMICTAL, levETIRAcetam, diazepam, pantoprazole, linaclotide, risperiDONE, and pramipexole.  No orders of the defined types were placed in this encounter.   There are no discontinued medications.  Follow-up: No follow-ups on file.   Sherlene Shams, MD

## 2018-02-06 NOTE — Assessment & Plan Note (Signed)
Controlled with addition of Keppra added in 02/2017

## 2018-02-06 NOTE — Assessment & Plan Note (Signed)
Managed with Mirapex . No changes today

## 2018-02-06 NOTE — Assessment & Plan Note (Addendum)
She has a moderate  intellectual disability , attends afternoon daycare.   Her emotional lability is managed with risperidone

## 2018-02-06 NOTE — Assessment & Plan Note (Signed)
Resolved with correction of constipation resulting in regain of appetite.  She has gained 9 lbs since last visit.

## 2018-02-06 NOTE — Assessment & Plan Note (Signed)
Currently managed with linzess and rare use of dulcolax.

## 2018-02-06 NOTE — Assessment & Plan Note (Signed)
Annual comprehensive preventive exam was done as well as an evaluation and management of chronic conditions .  During the course of the visit the patient was educated and counseled about appropriate screening and preventive services including :  diabetes screening, lipid analysis with projected  10 year  risk for CAD, nutrition counseling,and recommended immunizations.  Printed recommendations for health maintenance screenings was given.  

## 2018-02-06 NOTE — Patient Instructions (Signed)

## 2018-02-12 ENCOUNTER — Other Ambulatory Visit: Payer: Self-pay | Admitting: Internal Medicine

## 2018-02-13 NOTE — Telephone Encounter (Signed)
Refilled: 11/15/2017 Last OV: 02/06/2018 Next OV: 08/08/2018

## 2018-02-20 DIAGNOSIS — G809 Cerebral palsy, unspecified: Secondary | ICD-10-CM | POA: Diagnosis not present

## 2018-02-27 DIAGNOSIS — H40013 Open angle with borderline findings, low risk, bilateral: Secondary | ICD-10-CM | POA: Diagnosis not present

## 2018-02-27 DIAGNOSIS — H5022 Vertical strabismus, left eye: Secondary | ICD-10-CM | POA: Diagnosis not present

## 2018-02-27 DIAGNOSIS — R29891 Ocular torticollis: Secondary | ICD-10-CM | POA: Diagnosis not present

## 2018-03-04 ENCOUNTER — Other Ambulatory Visit: Payer: Self-pay | Admitting: Internal Medicine

## 2018-03-05 NOTE — Telephone Encounter (Signed)
Last refill 01/30/18 Last office visit 02/26/18 Next office visit 08/08/18

## 2018-03-06 DIAGNOSIS — G40919 Epilepsy, unspecified, intractable, without status epilepticus: Secondary | ICD-10-CM | POA: Diagnosis not present

## 2018-03-06 DIAGNOSIS — K029 Dental caries, unspecified: Secondary | ICD-10-CM | POA: Diagnosis not present

## 2018-03-10 ENCOUNTER — Encounter: Payer: Self-pay | Admitting: Emergency Medicine

## 2018-03-10 ENCOUNTER — Emergency Department
Admission: EM | Admit: 2018-03-10 | Discharge: 2018-03-10 | Disposition: A | Discharge status: Home / Self Care | Payer: 59 | Attending: Emergency Medicine | Admitting: Emergency Medicine

## 2018-03-10 ENCOUNTER — Other Ambulatory Visit: Payer: Self-pay

## 2018-03-10 DIAGNOSIS — Z79899 Other long term (current) drug therapy: Secondary | ICD-10-CM | POA: Insufficient documentation

## 2018-03-10 DIAGNOSIS — K0889 Other specified disorders of teeth and supporting structures: Secondary | ICD-10-CM | POA: Diagnosis not present

## 2018-03-10 DIAGNOSIS — R509 Fever, unspecified: Secondary | ICD-10-CM | POA: Insufficient documentation

## 2018-03-10 LAB — CBC WITH DIFFERENTIAL/PLATELET
Abs Immature Granulocytes: 0.01 10*3/uL (ref 0.00–0.07)
BASOS ABS: 0 10*3/uL (ref 0.0–0.1)
Basophils Relative: 1 %
EOS ABS: 0.1 10*3/uL (ref 0.0–0.5)
Eosinophils Relative: 2 %
HEMATOCRIT: 43 % (ref 36.0–46.0)
Hemoglobin: 14.4 g/dL (ref 12.0–15.0)
Immature Granulocytes: 0 %
Lymphocytes Relative: 25 %
Lymphs Abs: 1 10*3/uL (ref 0.7–4.0)
MCH: 28.1 pg (ref 26.0–34.0)
MCHC: 33.5 g/dL (ref 30.0–36.0)
MCV: 84 fL (ref 80.0–100.0)
MONO ABS: 0.4 10*3/uL (ref 0.1–1.0)
Monocytes Relative: 9 %
NRBC: 0 % (ref 0.0–0.2)
Neutro Abs: 2.6 10*3/uL (ref 1.7–7.7)
Neutrophils Relative %: 63 %
Platelets: 208 10*3/uL (ref 150–400)
RBC: 5.12 MIL/uL — AB (ref 3.87–5.11)
RDW: 11.7 % (ref 11.5–15.5)
WBC: 4.2 10*3/uL (ref 4.0–10.5)

## 2018-03-10 LAB — BASIC METABOLIC PANEL
Anion gap: 10 (ref 5–15)
BUN: 8 mg/dL (ref 6–20)
CHLORIDE: 104 mmol/L (ref 98–111)
CO2: 25 mmol/L (ref 22–32)
CREATININE: 0.57 mg/dL (ref 0.44–1.00)
Calcium: 9.5 mg/dL (ref 8.9–10.3)
GFR calc Af Amer: >60 mL/min (ref >60)
GFR calc non Af Amer: >60 mL/min (ref >60)
GLUCOSE: 110 mg/dL — AB (ref 70–99)
Potassium: 3.8 mmol/L (ref 3.5–5.1)
Sodium: 139 mmol/L (ref 135–145)

## 2018-03-10 NOTE — Discharge Instructions (Addendum)
Call her dentist if any continued problems.  Continue giving antibiotic and Tylenol as needed for infection and pain.  Increase fluids.  Even Jell-O and chicken soup is considered liquids.

## 2018-03-10 NOTE — ED Triage Notes (Addendum)
Pt arrived via POV with reports of dental procedure on Thursday, had 6 fillings completed. Pt taking tylenol and amoxicillin.   Pt c/o dental pain and fevers at home, highest temp at home 100.5 temporal on Thursday.  Other wise the fevers have been under 100 since Thursday.  Last time she has had anything for a fever was last night around 9pm. For temp that was 99.

## 2018-03-10 NOTE — ED Provider Notes (Signed)
[facility] Emergency Department Provider Note  ____________________________________________   First MD Initiated Contact with Patient 03/10/18 [phone]     (approximate)  I have reviewed the triage vital signs and the nursing notes.   HISTORY  Chief Complaint Dental Pain and Fever   HPI Lindsey [name] is a 32 y.o. female presents to the ED with complaint of dental pain and fever since dental procedure 4 days ago.  Patient had 6 fillings done.  She is currently taken Tylenol and amoxicillin.  Mother states that the highest her temperature has been is 100.5 with a temporal thermometer.  No other symptoms.  Patient has decreased p.o. intake secondary to her dental pain.   Past Medical History:  Diagnosis Date  . Epilepsy [facility]) age 4   negative metabolic workup has brain abnormalities by MRI (Dr. Quintin Alto, [facility])  . Insomnia   . Status post VNS (vagus nerve stimulator) placement 2000  . Temporal lobectomy behavior syndrome 1994   Right    Patient Active Problem List   Diagnosis Date Noted  . Poor dentition 09/02/2017  . Weight loss, unintentional 07/26/2017  . Cough 01/08/2017  . Constipation 11/10/2015  . Long-term use of high-risk medication 10/11/2015  . Xerosis of skin 12/12/2014  . Restless legs syndrome 07/12/2014  . Restlessness and agitation 09/10/2012  . Routine general medical examination at a health care facility 06/08/2012  . Cerebral palsy (HCC) 05/08/2012  . Acneiform dermatitis 06/30/2011  . Seizure disorder, primary (HCC) 03/23/2011    Past Surgical History:  Procedure Laterality Date  . ABDOMINAL HYSTERECTOMY  2005  . BRAIN SURGERY     Temporal Lobe resection  . CRANIOTOMY FOR TEMPORAL LOBECTOMY    . TONSILLECTOMY AND ADENOIDECTOMY  age 64    Prior to Admission medications   Medication Sig Start Date End Date Taking? Authorizing Provider  amoxicillin (AMOXIL) 500 MG capsule Take 500 mg by mouth 3 (three) times daily.   Yes  [provider]  diazepam (DIASTAT ACUDIAL) 10 MG GEL Place 20 mg rectally once.    [provider]  diazepam (VALIUM) 5 MG tablet TAKE 1 & 1/2 TABLETS BY MOUTH AT BEDTIME 03/05/18   Sherlene Shams, MD  LAMICTAL 100 MG tablet Take 100 mg by mouth 2 (two) times daily.  08/03/16   [provider]  levETIRAcetam (KEPPRA) 750 MG tablet Take 750 mg by mouth 2 (two) times daily.    [provider]  linaclotide Karlene Einstein) 290 MCG CAPS capsule Take 1 capsule (290 mcg total) by mouth daily before breakfast. 10/15/17   Sherlene Shams, MD  pantoprazole (PROTONIX) 40 MG tablet Take 1 tablet (40 mg total) by mouth daily. 10/15/17   Sherlene Shams, MD  pramipexole (MIRAPEX) 0.25 MG tablet TAKE THREE TABLETS EVERY DAY 01/08/18   Sherlene Shams, MD  risperiDONE (RISPERDAL) 0.5 MG tablet TAKE ONE TABLET BY MOUTH AT BEDTIME MAY TAKE ADDITIONAL 1/2 TABLET ASNEEDED 02/13/18   Sherlene Shams, MD    Allergies Influenza vac split quad; Benadryl [diphenhydramine hcl]; and Zyrtec [cetirizine hcl]  History reviewed. No pertinent family history.  Social History Social History   Tobacco Use  . Smoking status: Never Smoker  . Smokeless tobacco: Never Used  Substance Use Topics  . Alcohol use: No    Alcohol/week: 0.0 standard drinks  . Drug use: No    Review of Systems Constitutional: No fever/chills Eyes: No visual changes. ENT: Positive dental pain. Cardiovascular: Denies chest pain.  Respiratory: Denies shortness of breath. Gastrointestinal: No abdominal pain.  No nausea, no vomiting.  Musculoskeletal: Negative for back pain. Skin: Negative for rash. Neurological: Negative for headaches, focal weakness or numbness. ___________________________________________   PHYSICAL EXAM:  VITAL SIGNS: ED Triage Vitals [03/10/18 0638]  Enc Vitals Group     BP 125/90     Pulse Rate 100     Resp 18     Temp 98.4 F (36.9 C)     Temp Source Axillary     SpO2 97 %     Weight  130 lb (59 kg)     Height 5\' 6"  (1.676 m)     Head Circumference      Peak Flow      Pain Score      Pain Loc      Pain Edu?      Excl. in GC?    Constitutional: Alert and oriented. Well appearing and in no acute distress. Eyes: Conjunctivae are normal.  Head: Atraumatic. Mouth/Throat: Mucous membranes are moist.  Oropharynx non-erythematous.  Upper anterior gums are inflamed with mild swelling but no drainage is noted.  There is evidence of recent dental work done. Neck: No stridor.   Hematological/Lymphatic/Immunilogical: No cervical lymphadenopathy. Cardiovascular: Normal rate, regular rhythm. Grossly normal heart sounds.  Good peripheral circulation. Respiratory: Normal respiratory effort.  No retractions. Lungs CTAB. Musculoskeletal: No lower extremity tenderness nor edema.  No joint effusions. Neurologic:  Normal speech and language. No gross focal neurologic deficits are appreciated. Skin:  Skin is warm, dry and intact. No rash noted. Psychiatric: Mood and affect are normal. Speech and behavior are normal.  ____________________________________________   LABS (all labs ordered are listed, but only abnormal results are displayed)  Labs Reviewed  CBC WITH DIFFERENTIAL/PLATELET - Abnormal; Notable for the following components:      Result Value   RBC 5.12 (*)    All other components within normal limits  BASIC METABOLIC PANEL - Abnormal; Notable for the following components:   Glucose, Bld 110 (*)    All other components within normal limits    PROCEDURES  Procedure(s) performed: None  Procedures  Critical Care performed: No  ____________________________________________   INITIAL IMPRESSION / ASSESSMENT AND PLAN / ED COURSE  As part of my medical decision making, I reviewed the following data within the electronic MEDICAL RECORD NUMBER Notes from prior ED visits and [place] Controlled Substance Database  Patient presents to the ED today with complaint of dental pain after  having had dental procedure where she had 6 fillings.  Patient has continued to take amoxicillin and Tylenol.  There is been a low-grade temperature per mother with a temporal thermometer.  On exam there is minimal edema of the anterior upper gums.  No drainage is noted.  Patient will continue with antibiotics and encouraged to increase fluids.  She is to follow-up with her dentist if any continued problems.  ____________________________________________   FINAL CLINICAL IMPRESSION(S) / ED DIAGNOSES  Final diagnoses:  Pain, dental     ED Discharge Orders    None       Note:  This document was prepared using Dragon voice recognition software and may include unintentional dictation errors.    Tommi Rumps, PA-C 03/10/18 1635    Minna Antis, MD 03/11/18 2358

## 2018-03-10 NOTE — ED Notes (Signed)
See triage note   Per mom she has had some dental work done last week  Developed some low grade fever since  Recently placed on amoxil and tylenol #3 for pain  Afebrile on arrival

## 2018-05-12 ENCOUNTER — Other Ambulatory Visit: Payer: Self-pay | Admitting: Internal Medicine

## 2018-05-27 ENCOUNTER — Other Ambulatory Visit: Payer: Self-pay | Admitting: Internal Medicine

## 2018-06-09 ENCOUNTER — Other Ambulatory Visit: Payer: Self-pay | Admitting: Internal Medicine

## 2018-06-11 ENCOUNTER — Ambulatory Visit (INDEPENDENT_AMBULATORY_CARE_PROVIDER_SITE_OTHER): Payer: 59

## 2018-06-11 ENCOUNTER — Encounter: Payer: Self-pay | Admitting: Family

## 2018-06-11 ENCOUNTER — Ambulatory Visit (INDEPENDENT_AMBULATORY_CARE_PROVIDER_SITE_OTHER): Payer: 59 | Admitting: Family

## 2018-06-11 VITALS — BP 104/68 | HR 81 | Temp 94.3°F | Resp 18 | Ht 66.0 in | Wt 134.1 lb

## 2018-06-11 DIAGNOSIS — H9202 Otalgia, left ear: Secondary | ICD-10-CM | POA: Insufficient documentation

## 2018-06-11 DIAGNOSIS — M25551 Pain in right hip: Secondary | ICD-10-CM | POA: Diagnosis not present

## 2018-06-11 DIAGNOSIS — M25552 Pain in left hip: Secondary | ICD-10-CM | POA: Diagnosis not present

## 2018-06-11 NOTE — Assessment & Plan Note (Signed)
Benign exam.  Mother will continue to monitor

## 2018-06-11 NOTE — Assessment & Plan Note (Signed)
Limited exam.  Jointly agreed with mother to pursue x-rays.  Suspect ataxic gait, compensation thereof, possible trochanteric bursitis.  Advised NSAIDs, heat.  If no improvement patient let me know we can pursue orthopedics, physical therapy

## 2018-06-11 NOTE — Patient Instructions (Signed)
May be degree of trochanteric bursitis playing a role, particularly with her weight gain. Literature below.   Ibuprofen is reasonable, heat is a great therapy.  Let me know if she continues to complain of this, as we could certainly consult orthopedics.  Let me know if she further complains of  her left ear.  Trochanteric Bursitis Trochanteric bursitis is a condition that causes hip pain. Trochanteric bursitis happens when fluid-filled sacs (bursae) in the hip get irritated. Normally these sacs absorb shock and help strong bands of tissue (tendons) in your hip glide smoothly over each other and over your hip bones. What are the causes? This condition results from increased friction between the hip bones and the tendons that go over them. This condition can happen if you:  Have weak hips.  Use your hip muscles too much (overuse).  Get hit in the hip. What increases the risk? This condition is more likely to develop in:  Women.  Adults who are middle-aged or older.  People with arthritis or a spinal condition.  People with weak buttocks muscles (gluteal muscles).  People who have one leg that is shorter than the other.  People who participate in certain kinds of athletic activities, such as: ? Running sports, especially long-distance running. ? Contact sports, like football or martial arts. ? Sports in which falls may occur, like skiing. What are the signs or symptoms? The main symptom of this condition is pain and tenderness over the point of your hip. The pain may be:  Sharp and intense.  Dull and achy.  Felt on the outside of your thigh. It may increase when you:  Lie on your side.  Walk or run.  Go up on stairs.  Sit.  Stand up after sitting.  Stand for long periods of time. How is this diagnosed? This condition may be diagnosed based on:  Your symptoms.  Your medical history.  A physical exam.  Imaging tests, such as: ? X-rays to check your  bones. ? An MRI or ultrasound to check your tendons and muscles. During your physical exam, your health care provider will check the movement and strength of your hip. He or she may press on the point of your hip to check for pain. How is this treated? This condition may be treated by:  Resting.  Reducing your activity.  Avoiding activities that cause pain.  Using crutches, a cane, or a walker to decrease the strain on your hip.  Taking medicine to help with swelling.  Having medicine injected into the bursae to help with swelling.  Using ice, heat, and massage therapy for pain relief.  Physical therapy exercises for strength and flexibility.  Surgery (rare). Follow these instructions at home: Activity  Rest.  Avoid activities that cause pain.  Return to your normal activities as told by your health care provider. Ask your health care provider what activities are safe for you. Managing pain, stiffness, and swelling  Take over-the-counter and prescription medicines only as told by your health care provider.  If directed, apply heat to the injured area as told by your health care provider. ? Place a towel between your skin and the heat source. ? Leave the heat on for 20-30 minutes. ? Remove the heat if your skin turns bright red. This is especially important if you are unable to feel pain, heat, or cold. You may have a greater risk of getting burned.  If directed, apply ice to the injured area: ? Put ice in  a plastic bag. ? Place a towel between your skin and the bag. ? Leave the ice on for 20 minutes, 2-3 times a day. General instructions  If the affected leg is one that you use for driving, ask your health care provider when it is safe to drive.  Use crutches, a cane, or a walker as told by your health care provider.  If one of your legs is shorter than the other, get fitted for a shoe insert.  Lose weight if you are overweight. How is this prevented?  Wear  supportive footwear that is appropriate for your sport.  If you have hip pain, start any new exercise or sport slowly.  Maintain physical fitness, including: ? Strength. ? Flexibility. Contact a health care provider if:  Your pain does not improve with 2-4 weeks. Get help right away if:  You develop severe pain.  You have a fever.  You develop increased redness over your hip.  You have a change in your bowel function or bladder function.  You cannot control the muscles in your feet. This information is not intended to replace advice given to you by your health care provider. Make sure you discuss any questions you have with your health care provider. Document Released: 05/31/2004 Document Revised: 12/28/2015 Document Reviewed: 04/08/2015 [employer] Interactive Patient Education  2019 [employer]

## 2018-06-11 NOTE — Progress Notes (Signed)
Subjective:    Patient ID: Lindsey [name], female    DOB: 1986-04-29, 33 y.o.   MRN: [id number]  CC: Lindsey [name] is a 33 y.o. female who presents today for an acute visit.    HPI: Left ear pain, daughter mentioned  last night.  NO ear drainage, congestion, cough fever constipation  Right hip pain x one week, unchanged. Daughter has pointed to front right of hip on two occasions telling her mother it hurts.  No falls. Walking normally for her. Urinating well. Has given ibuprofen , heat and suspects this has given some relief.   Accompanied by mother   HISTORY:  Past Medical History:  Diagnosis Date  . Epilepsy [facility]) age 41   negative metabolic workup has brain abnormalities by MRI (Dr. Quintin Alto, [facility])  . Insomnia   . Status post VNS (vagus nerve stimulator) placement 2000  . Temporal lobectomy behavior syndrome 1994   Right   Past Surgical History:  Procedure Laterality Date  . ABDOMINAL HYSTERECTOMY  2005  . BRAIN SURGERY     Temporal Lobe resection  . CRANIOTOMY FOR TEMPORAL LOBECTOMY    . TONSILLECTOMY AND ADENOIDECTOMY  age 52   No family history on file.  Allergies: Influenza vac split quad; Benadryl [diphenhydramine hcl]; and Zyrtec [cetirizine hcl] Current Outpatient Medications on File Prior to Visit  Medication Sig Dispense Refill  . diazepam (DIASTAT ACUDIAL) 10 MG GEL Place 20 mg rectally once.    . diazepam (VALIUM) 5 MG tablet TAKE 1 & 1/2 TABLETS BY MOUTH AT BEDTIME 45 tablet 5  . LAMICTAL 100 MG tablet Take 100 mg by mouth 2 (two) times daily.     Marland Kitchen levETIRAcetam (KEPPRA) 750 MG tablet Take 750 mg by mouth 2 (two) times daily.    Marland Kitchen LINZESS 290 MCG CAPS capsule TAKE 1 CAPSULE BY MOUTH EVERY MORNING BEFORE BREAKFAST 30 capsule 5  . pantoprazole (PROTONIX) 40 MG tablet TAKE ONE TABLET BY MOUTH DAILY 30 tablet 5  . pramipexole (MIRAPEX) 0.25 MG tablet TAKE THREE TABLETS EVERY DAY 90 tablet 3  . risperiDONE (RISPERDAL) 0.5 MG tablet TAKE ONE TABLET AT  BEDTIME AND 1/2 TABLET AS NEEDED 30 tablet 2  . amoxicillin (AMOXIL) 500 MG capsule Take 500 mg by mouth 3 (three) times daily.     No current facility-administered medications on file prior to visit.     Social History   Tobacco Use  . Smoking status: Never Smoker  . Smokeless tobacco: Never Used  Substance Use Topics  . Alcohol use: No    Alcohol/week: 0.0 standard drinks  . Drug use: No    Review of Systems  Constitutional: Negative for chills and fever.  HENT: Positive for ear pain. Negative for congestion and ear discharge.   Respiratory: Negative for cough.   Cardiovascular: Negative for chest pain and palpitations.  Gastrointestinal: Negative for abdominal distention, abdominal pain, constipation, diarrhea, nausea and vomiting.  Musculoskeletal: Positive for arthralgias. Negative for back pain.  Neurological: Negative for numbness.      Objective:    BP 104/68   Pulse 81   Temp (!) 94.3 F (34.6 C) (Axillary)   Resp 18   Ht 5\' 6"  (1.676 m)   Wt 134 lb 2 oz (60.8 kg)   LMP 01/09/2011   SpO2 99%   BMI 21.65 kg/m    NOTE: temperature is axilary Physical Exam Vitals signs reviewed.  Constitutional:      Appearance: Normal appearance. She is well-developed.  HENT:     Head: Normocephalic and atraumatic.     Right Ear: Hearing, tympanic membrane, ear canal and external ear normal. No decreased hearing noted. No drainage, swelling or tenderness. No middle ear effusion. No foreign body. Tympanic membrane is not erythematous or bulging.     Left Ear: Hearing, tympanic membrane, ear canal and external ear normal. No decreased hearing noted. No drainage, swelling or tenderness.  No middle ear effusion. No foreign body. Tympanic membrane is not erythematous or bulging.     Nose: Nose normal. No rhinorrhea.     Right Sinus: No maxillary sinus tenderness or frontal sinus tenderness.     Left Sinus: No maxillary sinus tenderness or frontal sinus tenderness.      Mouth/Throat:     Pharynx: Uvula midline. No oropharyngeal exudate or posterior oropharyngeal erythema.     Tonsils: No tonsillar abscesses.  Eyes:     Conjunctiva/sclera: Conjunctivae normal.  Cardiovascular:     Rate and Rhythm: Normal rate and regular rhythm.     Pulses: Normal pulses.     Heart sounds: Normal heart sounds.  Pulmonary:     Effort: Pulmonary effort is normal.     Breath sounds: Normal breath sounds. No wheezing, rhonchi or rales.  Abdominal:     General: Bowel sounds are normal. There is no distension.     Palpations: Abdomen is soft. Abdomen is not rigid. There is no fluid wave or mass.     Tenderness: There is no abdominal tenderness. There is no guarding or rebound.  Musculoskeletal:     Right hip: She exhibits no tenderness.     Lumbar back: She exhibits no tenderness and no bony tenderness.     Comments: Limited exam due to patient cooperation Gait is wide based,ataxic gait.    Lymphadenopathy:     Head:     Right side of head: No submental, submandibular, tonsillar, preauricular, posterior auricular or occipital adenopathy.     Left side of head: No submental, submandibular, tonsillar, preauricular, posterior auricular or occipital adenopathy.     Cervical: No cervical adenopathy.  Skin:    General: Skin is warm and dry.  Neurological:     Mental Status: She is alert.     Comments: Sensation BLE appears intact  Psychiatric:        Speech: Speech normal.        Behavior: Behavior normal.        Thought Content: Thought content normal.        Assessment & Plan:   Problem List Items Addressed This Visit      Other   Right hip pain - Primary    Limited exam.  Jointly agreed with mother to pursue x-rays.  Suspect ataxic gait, compensation thereof, possible trochanteric bursitis.  Advised NSAIDs, heat.  If no improvement patient let me know we can pursue orthopedics, physical therapy      Relevant Orders   DG HIPS BILAT WITH PELVIS 3-4 VIEWS   Ear  pain, left    Benign exam.  Mother will continue to monitor            I am having Lindsey [name] maintain her diazepam, LAMICTAL, levETIRAcetam, diazepam, amoxicillin, pantoprazole, LINZESS, pramipexole, and risperiDONE.   No orders of the defined types were placed in this encounter.   Return precautions given.   Risks, benefits, and alternatives of the medications and treatment plan prescribed today were discussed, and patient expressed understanding.   Education regarding  symptom management and diagnosis given to patient on AVS.  Continue to follow with Sherlene Shamsullo, Teresa L, MD for routine health maintenance.   Lindsey [name] and I agreed with plan.   Rennie PlowmanMargaret Jaaliyah Lucatero, FNP

## 2018-06-12 ENCOUNTER — Encounter: Payer: Self-pay | Admitting: Family

## 2018-06-26 DIAGNOSIS — M25551 Pain in right hip: Secondary | ICD-10-CM | POA: Diagnosis not present

## 2018-07-25 ENCOUNTER — Other Ambulatory Visit: Payer: Self-pay | Admitting: Internal Medicine

## 2018-08-08 ENCOUNTER — Ambulatory Visit: Payer: 59 | Admitting: Internal Medicine

## 2018-08-25 ENCOUNTER — Other Ambulatory Visit: Payer: Self-pay | Admitting: Internal Medicine

## 2018-08-25 NOTE — Telephone Encounter (Signed)
Refilled: 03/05/2018 Last OV: 02/06/2018 Next OV: 10/29/2018

## 2018-08-31 ENCOUNTER — Other Ambulatory Visit: Payer: Self-pay | Admitting: Internal Medicine

## 2018-10-28 ENCOUNTER — Other Ambulatory Visit: Payer: Self-pay

## 2018-10-29 ENCOUNTER — Ambulatory Visit: Payer: 59 | Admitting: Internal Medicine

## 2018-10-29 NOTE — Telephone Encounter (Signed)
Pt dropped form off from [employer] in Dr. Demetrios Isaacs color folder upfront  Please mail back to pt mother when completed

## 2018-10-31 ENCOUNTER — Ambulatory Visit: Payer: 59 | Admitting: Internal Medicine

## 2018-11-14 ENCOUNTER — Other Ambulatory Visit: Payer: Self-pay

## 2018-11-14 ENCOUNTER — Telehealth: Payer: Self-pay | Admitting: Internal Medicine

## 2018-11-14 ENCOUNTER — Encounter: Payer: Self-pay | Admitting: Internal Medicine

## 2018-11-14 ENCOUNTER — Ambulatory Visit (INDEPENDENT_AMBULATORY_CARE_PROVIDER_SITE_OTHER): Payer: 59 | Admitting: Internal Medicine

## 2018-11-14 DIAGNOSIS — L309 Dermatitis, unspecified: Secondary | ICD-10-CM | POA: Diagnosis not present

## 2018-11-14 MED ORDER — TRIAMCINOLONE ACETONIDE 0.1 % EX CREA: 1.0000 "application " | TOPICAL_CREAM | Freq: Two times a day (BID) | CUTANEOUS | 0 refills | Status: DC

## 2018-11-14 NOTE — Progress Notes (Addendum)
Virtual Visit via [email or web address]  This visit type was conducted due to national recommendations for restrictions regarding the COVID-19 pandemic (e.g. social distancing).  This format is felt to be most appropriate for this patient at this time.  All issues noted in this document were discussed and addressed.  No physical exam was performed (except for noted visual exam findings with Video Visits).   I connected with@ on 11/14/18 at  2:00 PM EDT by a video enabled telemedicine application  and verified that I am speaking with the correct person using two identifiers. Location patient: home Location provider: work or home office Persons participating in the virtual visit: patient, provider  I discussed the limitations, risks, security and privacy concerns of performing an evaluation and management service by telephone and the availability of in person appointments. I also discussed with the patient that there may be a patient responsible charge related to this service. The patient expressed understanding and agreed to proceed.  Reason for visit: non resolving rash   HPI:   rash on dorsum of right  Foot.  sarted about a week ago after she scratched the top of her foot .  Mother has been applying antibacterial ointment for 5 days,  No change but has become very red. Patient does not wear sandals or walk outside barefoot.  Wears socks and running shoes when outside.  No recent detergent changes. No history of known insect bite .   ROS: See pertinent positives and negatives per HPI.  Past Medical History:  Diagnosis Date  . Epilepsy [facility]) age 5   negative metabolic workup has brain abnormalities by MRI (Dr. Lisabeth Devoid, [place])  . Insomnia   . Status post VNS (vagus nerve stimulator) placement 2000  . Temporal lobectomy behavior syndrome 1994   Right    Past Surgical History:  Procedure Laterality Date  . ABDOMINAL HYSTERECTOMY  2005  . BRAIN SURGERY     Temporal Lobe resection  . CRANIOTOMY FOR  TEMPORAL LOBECTOMY    . TONSILLECTOMY AND ADENOIDECTOMY  age 89    No family history on file.  SOCIAL HX:  reports that she has never smoked. She has never used smokeless tobacco. She reports that she does not drink alcohol or use drugs.  She has limited vocabulary and all histor y is provided by mother   Current Outpatient Medications:  .  diazepam (DIASTAT ACUDIAL) 10 MG GEL, Place 20 mg rectally once., Disp: , Rfl:  .  diazepam (VALIUM) 5 MG tablet, TAKE ONE AND ONE-HALF TABLET BY MOUTH ATBEDTIME, Disp: 45 tablet, Rfl: 3 .  LAMICTAL 100 MG tablet, Take 100 mg by mouth 2 (two) times daily. , Disp: , Rfl:  .  levETIRAcetam (KEPPRA) 750 MG tablet, Take 750 mg by mouth 2 (two) times daily., Disp: , Rfl:  .  pramipexole (MIRAPEX) 0.25 MG tablet, TAKE THREE TABLETS EVERY DAY, Disp: 90 tablet, Rfl: 3 .  risperiDONE (RISPERDAL) 0.5 MG tablet, TAKE ONE TABLET AT BEDTIME AND 1/2 TABLET AS NEEDED, Disp: 30 tablet, Rfl: 2 .  LINZESS 290 MCG CAPS capsule, TAKE ONE CAPSULE EACH MORNING BEFORE BREAKFAST (Patient not taking: Reported on 11/14/2018), Disp: 30 capsule, Rfl: 5 .  pantoprazole (PROTONIX) 40 MG tablet, TAKE 1 TABLET BY MOUTH DAILY (Patient not taking: Reported on 11/14/2018), Disp: 30 tablet, Rfl: 5 .  triamcinolone cream (KENALOG) 0.1 %, Apply 1 application topically 2 (two) times daily. To affected area, and keep covered., Disp: 30 g, Rfl: 0  EXAM:  General impression:  alert, cooperative and articulate.  No signs of being in distress  Lungs: speech is fluent sentence length suggests that patient is not short of breath and not punctuated by cough, sneezing or sniffing. Marland Kitchen.   Psych: affect normal.  speech is articulate and non pressured .  Denies suicidal thoughts   Skin:  Erythematous macular rash in a follicular pattern on dorsum of right foot  ASSESSMENT AND PLAN:  Dermatitis of right foot No response to empiric topical antimicrobials.  Trial of triamcinolone bid and keep covered.  Add  oral anb if no improvement in 4 days     I discussed the assessment and treatment plan with the patient. The patient was provided an opportunity to ask questions and all were answered. The patient agreed with the plan and demonstrated an understanding of the instructions.   The patient was advised to call back or seek an in-person evaluation if the symptoms worsen or if the condition fails to improve as anticipated.  I provided 25 minutes of non-face-to-face time during this encounter.   Sherlene Shamseresa L Cloy Cozzens, MD

## 2018-11-14 NOTE — Assessment & Plan Note (Signed)
No response to empiric topical antimicrobials.  Trial of triamcinolone bid and keep covered.  Add oral anb if no improvement in 4 days

## 2018-11-14 NOTE — Telephone Encounter (Signed)
Patient's mother called and says she received a MyChart message from Dr. Derrel Nip to schedule a telemetry visit for a rash. I called the office and spoke to Hauser, [facility] who asked to speak to the mother, the call was connected successfully.

## 2018-11-14 NOTE — Progress Notes (Signed)
.  h 

## 2018-12-24 ENCOUNTER — Other Ambulatory Visit: Payer: Self-pay | Admitting: Internal Medicine

## 2018-12-25 ENCOUNTER — Telehealth: Payer: Self-pay | Admitting: Internal Medicine

## 2018-12-25 NOTE — Telephone Encounter (Signed)
Please refill per pt request pramipexole (MIRAPEX) 0.25 MG tablet   diazepam (VALIUM) 5 MG tablet     Send to [place], [place] - [place] [phone] (Phone) [phone] (Fax)

## 2018-12-26 ENCOUNTER — Telehealth: Payer: Self-pay | Admitting: Internal Medicine

## 2018-12-26 NOTE — Telephone Encounter (Signed)
Patient's mother called again to check the status of her diazapam request.  Patient is all out of the medication.

## 2018-12-26 NOTE — Telephone Encounter (Signed)
This refill cannot be delegated   Requested Prescriptions  Pending Prescriptions Disp Refills  . diazepam (VALIUM) 5 MG tablet [Pharmacy Med Name: DIAZEPAM 5 MG TAB] 45 tablet     Sig: TAKE 1 AND 1/2 TABLET AT BEDTIME     Not Delegated - Psychiatry:  Anxiolytics/Hypnotics Failed - 12/26/2018  7:48 AM      Failed - This refill cannot be delegated      Failed - Urine Drug Screen completed in last 360 days.      Passed - Valid encounter within last 6 months    Recent Outpatient Visits          1 month ago Dermatitis of right foot   [facility] Sherlene Shamsullo, Teresa L, MD   6 months ago Right hip pain   [facility] [place] Arnett, Lyn RecordsMargaret G, FNP   10 months ago Routine general medical examination at a health care facility   [facility] [place] Sherlene Shamsullo, Teresa L, MD   1 year ago Cough   [facility] [place] McLean-Scocuzza, Pasty Spillersracy N, MD   1 year ago Poor dentition   [facility] [place] McLean-Scocuzza, Pasty Spillersracy N, MD      Future Appointments            In 1 month Darrick Huntsmanullo, Mar Daringeresa L, MD [facility] [place], [facility]           . pramipexole (MIRAPEX) 0.25 MG tablet [Pharmacy Med Name: PRAMIPEXOLE DIHYDROCHLORIDE 0.25 MG] 90 tablet 3    Sig: TAKE THREE TABLETS BY MOUTH EVERY DAY     Neurology:  Parkinsonian Agents Passed - 12/26/2018  7:48 AM      Passed - Last BP in normal range    BP Readings from Last 1 Encounters:  06/11/18 104/68         Passed - Valid encounter within last 12 months    Recent Outpatient Visits          1 month ago Dermatitis of right foot   [facility] [place] [facility] Sherlene Shamsullo, Teresa L, MD   6 months ago Right hip pain   [facility] [place] Arnett, Lyn RecordsMargaret G, FNP   10 months ago Routine general medical examination at a health care facility   [facility] [place] Sherlene Shamsullo, Teresa L, MD   1 year ago Cough   [facility] [place] McLean-Scocuzza, Pasty Spillersracy N, MD   1 year ago Poor dentition   [facility] [place] McLean-Scocuzza, Pasty Spillersracy N, MD      Future Appointments            In 1 month Darrick Huntsmanullo, Mar Daringeresa L, MD [facility] [place], [facility]           . risperiDONE (RISPERDAL) 0.5 MG tablet [Pharmacy Med Name: RISPERIDONE 0.5 MG TAB] 135 tablet 0    Sig: TAKE ONE TABLET AT BEDTIME AND 1/2 TABLET AS NEEDED     Not Delegated - Psychiatry:  Antipsychotics - Second Generation (Atypical) - risperidone Failed - 12/26/2018  7:48 AM      Failed - This refill cannot be delegated      Failed - Prolactin Level (serum) in normal range and within 180 days    No results found for: PROLACTIN, TOTPROLACTIN       Failed - ALT in normal range and within 180 days    ALT  Date Value Ref Range Status  07/23/2017 9 0 - 35 U/L Final  Failed - AST in normal range and within 180 days    AST  Date Value Ref Range Status  07/23/2017 11 0 - 37 U/L Final         Passed - Valid encounter within last 6 months    Recent Outpatient Visits          1 month ago Dermatitis of right foot   [place] Crecencio Mc, MD   6 months ago Right hip pain   [place] Crumpler, FNP   10 months ago Routine general medical examination at a health care facility   [facility] Crecencio Mc, MD   1 year ago Cough   [facility] [place] McLean-Scocuzza, Nino Glow, MD   1 year ago Poor dentition   [place] McLean-Scocuzza, Nino Glow, MD      Future Appointments            In 1 month Derrel Nip, Aris Everts, MD [facility]

## 2018-12-26 NOTE — Telephone Encounter (Signed)
Refilled: 08/25/2018 Last OV: 11/14/2018 Next OV: 02/12/2019

## 2018-12-26 NOTE — Telephone Encounter (Signed)
PT's mother dropped off a medication form to be completed. Form is up front in color folder

## 2018-12-29 NOTE — Telephone Encounter (Signed)
LMTCB

## 2019-01-09 ENCOUNTER — Other Ambulatory Visit: Payer: Self-pay | Admitting: Internal Medicine

## 2019-01-09 DIAGNOSIS — Z20822 Contact with and (suspected) exposure to covid-19: Secondary | ICD-10-CM

## 2019-01-11 LAB — NOVEL CORONAVIRUS, NAA: SARS-CoV-2, NAA: NOT DETECTED

## 2019-01-15 ENCOUNTER — Other Ambulatory Visit: Payer: Self-pay | Admitting: Internal Medicine

## 2019-01-15 NOTE — Telephone Encounter (Signed)
Refilled: 12/26/2018 Last OV: 11/14/2018. Next OV: 02/12/2019

## 2019-01-21 ENCOUNTER — Other Ambulatory Visit: Payer: Self-pay | Admitting: Internal Medicine

## 2019-02-09 ENCOUNTER — Encounter: Payer: 59 | Admitting: Internal Medicine

## 2019-02-10 ENCOUNTER — Other Ambulatory Visit: Payer: Self-pay

## 2019-02-11 ENCOUNTER — Encounter: Payer: 59 | Admitting: Internal Medicine

## 2019-02-12 ENCOUNTER — Encounter: Payer: Self-pay | Admitting: Internal Medicine

## 2019-02-12 ENCOUNTER — Other Ambulatory Visit: Payer: Self-pay

## 2019-02-12 ENCOUNTER — Ambulatory Visit (INDEPENDENT_AMBULATORY_CARE_PROVIDER_SITE_OTHER): Payer: 59 | Admitting: Internal Medicine

## 2019-02-12 VITALS — BP 108/72 | HR 94 | Temp 98.1°F | Resp 16 | Ht 66.0 in | Wt 133.4 lb

## 2019-02-12 DIAGNOSIS — Z79899 Other long term (current) drug therapy: Secondary | ICD-10-CM

## 2019-02-12 DIAGNOSIS — G40909 Epilepsy, unspecified, not intractable, without status epilepticus: Secondary | ICD-10-CM | POA: Diagnosis not present

## 2019-02-12 DIAGNOSIS — Z0000 Encounter for general adult medical examination without abnormal findings: Secondary | ICD-10-CM

## 2019-02-12 DIAGNOSIS — G808 Other cerebral palsy: Secondary | ICD-10-CM

## 2019-02-12 DIAGNOSIS — K5900 Constipation, unspecified: Secondary | ICD-10-CM

## 2019-02-12 LAB — COMPREHENSIVE METABOLIC PANEL
ALT: 6 U/L (ref 0–35)
AST: 11 U/L (ref 0–37)
Albumin: 4.4 g/dL (ref 3.5–5.2)
Alkaline Phosphatase: 62 U/L (ref 39–117)
BUN: 9 mg/dL (ref 6–23)
CO2: 23 mEq/L (ref 19–32)
Calcium: 9.6 mg/dL (ref 8.4–10.5)
Chloride: 101 mEq/L (ref 96–112)
Creatinine, Ser: 0.58 mg/dL (ref 0.40–1.20)
GFR: 120.83 mL/min (ref >60.00)
Glucose, Bld: 92 mg/dL (ref 70–99)
Potassium: 3.8 mEq/L (ref 3.5–5.1)
Sodium: 133 mEq/L — ABNORMAL LOW (ref 135–145)
Total Bilirubin: 0.6 mg/dL (ref 0.2–1.2)
Total Protein: 6.9 g/dL (ref 6.0–8.3)

## 2019-02-12 LAB — TSH: TSH: 1.7 u[IU]/mL (ref 0.35–4.50)

## 2019-02-12 LAB — LIPID PANEL
Cholesterol: 113 mg/dL (ref 0–200)
HDL: 36.6 mg/dL — ABNORMAL LOW (ref >39.00)
LDL Cholesterol: 65 mg/dL (ref 0–99)
NonHDL: 76.65
Total CHOL/HDL Ratio: 3
Triglycerides: 59 mg/dL (ref 0.0–149.0)
VLDL: 11.8 mg/dL (ref 0.0–40.0)

## 2019-02-12 LAB — B12 AND FOLATE PANEL
Folate: 7 ng/mL (ref >5.9)
Vitamin B-12: 343 pg/mL (ref 211–911)

## 2019-02-12 NOTE — Progress Notes (Signed)
Patient ID: Lindsey [name], female    DOB: 1985/11/23  Age: 33 y.o. MRN: [id number]  The patient is here for annual  preventive  examination and management of other chronic and acute problems. She is accompanied by her mother  As she is unable to provide history due to chronic condition of cerebral palsy .   The family has a new great niece that [name] enjoys holding.  Family is concerned about pertussis and mother is requesting TD without pertussis because of her seizure history (has noever had the pertussis vaccine .  Discussed the individual components and purpose of the  TDaP  Vaccine.  Her  Neurologist has  advised her to NOT GET THE PERTUSSIS vaccine since childhood .  She does not come in contact with dogs,  Cats or dirt.    no seizures in 2 years   Weight stable.    The risk factors are reflected in the social history.  The roster of all physicians providing medical care to patient - is listed in the Snapshot section of the chart.  Activities of daily living:  The patient is  Not 100% independent in all ADLs: dressing, toileting, or feeding.  She ambulates without a walker independently   Home safety : The patient has smoke detectors in the home. They wear seatbelts.  There are no firearms at home. There is no violence in the home.   There is no risks for hepatitis, STDs or HIV. There is no   history of blood transfusion. They have no travel history to infectious disease endemic areas of the world.  The patient has seen their dentist in the last six month. They have seen their eye doctor in the last year. .  They do not  have excessive sun exposure. Discussed the need for sun protection: hats, long sleeves and use of sunscreen if there is significant sun exposure.   Diet: the importance of a healthy diet is discussed. They do have a healthy diet. She has maintained a healthy weight for the last 2 years.   The benefits of regular aerobic exercise were discussed. She walks 4 times  per week ,  20 minutes.   Depression screen: there are no signs or vegative symptoms of depression- irritability, change in appetite, anhedonia, sadness/tearfullness.  Cognitive assessment: the patient cannot manage her  financial and personal affairs and is actively engaged.   The following portions of the patient's history were reviewed and updated as appropriate: allergies, current medications, past family history, past medical history,  past surgical history, past social history  and problem list.  Visual acuity was not assessed per patient preference since she has regular follow up with her ophthalmologist. Hearing and body mass index were assessed and reviewed.   During the course of the visit the patient was educated and counseled about appropriate screening and preventive services including : fall prevention , diabetes screening, nutrition counseling, colorectal cancer screening, and recommended immunizations.    CC: The primary encounter diagnosis was Constipation, unspecified constipation type. Diagnoses of Long-term use of high-risk medication, Seizure disorder, primary (HCC), Other cerebral palsy (HCC), and Routine general medical examination at a health care facility were also pertinent to this visit.  History [name] has a past medical history of Epilepsy (HCC) (age 42), Insomnia, Status post VNS (vagus nerve stimulator) placement (2000), and Temporal lobectomy behavior syndrome (1994).   She has a past surgical history that includes Tonsillectomy and adenoidectomy (age 89); Abdominal hysterectomy (2005); Brain surgery; and  Craniotomy for temporal lobectomy.   Her family history is not on file.She reports that she has never smoked. She has never used smokeless tobacco. She reports that she does not drink alcohol or use drugs.  Outpatient Medications Prior to Visit  Medication Sig Dispense Refill  . diazepam (DIASTAT ACUDIAL) 10 MG GEL Place 20 mg rectally once.    . diazepam  (VALIUM) 5 MG tablet TAKE 1 AND 1/2 TABLET AT BEDTIME 45 tablet 5  . LAMICTAL 100 MG tablet Take 100 mg by mouth 2 (two) times daily.     Marland Kitchen levETIRAcetam (KEPPRA) 750 MG tablet Take 750 mg by mouth 2 (two) times daily.    . pramipexole (MIRAPEX) 0.25 MG tablet TAKE 3 TABLETS BY MOUTH DAILY 90 tablet 0  . risperiDONE (RISPERDAL) 0.5 MG tablet TAKE ONE TABLET AT BEDTIME AND 1/2 TABLET AS NEEDED 135 tablet 0  . triamcinolone cream (KENALOG) 0.1 % Apply 1 application topically 2 (two) times daily. To affected area, and keep covered. 30 g 0  . LINZESS 290 MCG CAPS capsule TAKE ONE CAPSULE EACH MORNING BEFORE BREAKFAST (Patient not taking: Reported on 11/14/2018) 30 capsule 5  . pantoprazole (PROTONIX) 40 MG tablet TAKE 1 TABLET BY MOUTH DAILY (Patient not taking: Reported on 11/14/2018) 30 tablet 5   No facility-administered medications prior to visit.     Review of Systems   Patient denies headache, fevers, malaise, unintentional weight loss, skin rash, eye pain, sinus congestion and sinus pain, sore throat, dysphagia,  hemoptysis , cough, dyspnea, wheezing, chest pain, palpitations, orthopnea, edema, abdominal pain, nausea, melena, diarrhea, constipation, flank pain, dysuria, hematuria, urinary  Frequency, nocturia, numbness, tingling, seizures,  Focal weakness, Loss of consciousness,  Tremor, insomnia, depression, anxiety, and suicidal ideation.      Objective:  BP 108/72 (BP Location: Left Arm, Patient Position: Sitting, Cuff Size: Normal)   Pulse 94   Temp 98.1 F (36.7 C) (Temporal)   Resp 16   Ht 5\' 6"  (1.676 m)   Wt 133 lb 6.4 oz (60.5 kg)   LMP 01/09/2011   SpO2 97%   BMI 21.53 kg/m   Physical Exam  General appearance: alert, not  cooperative and appears younger than stated age Ears: normal TM's and external ear canals both ears Throat: lips, mucosa, and tongue normal; teeth and gums normal Neck: no adenopathy, no carotid bruit, supple, symmetrical, trachea midline and thyroid  not enlarged, symmetric, no tenderness/mass/nodules Back: symmetric, no curvature. ROM normal. No CVA tenderness. Lungs: clear to auscultation bilaterally Heart: regular rate and rhythm, S1, S2 normal, no murmur, click, rub or gallop Abdomen: soft, non-tender; bowel sounds normal; no masses,  no organomegaly Pulses: 2+ and symmetric Skin: Skin color, texture, turgor normal. No rashes or lesions Lymph nodes: Cervical, supraclavicular, and axillary nodes normal.  Assessment & Plan:   Problem List Items Addressed This Visit      Unprioritized   Seizure disorder, primary (HCC)    She has been seizure free for nearly 2 years.  No changes to medications today       Cerebral palsy (HCC)    She has a moderate  intellectual disability , attends afternoon daycare.   Her emotional lability is no longer requiring risperidone.  Forms signed       Routine general medical examination at a health care facility    .Annual comprehensive preventive exam was done as well as an evaluation and management of chronic conditions .  During the course of the visit the  patient was educated and counseled about appropriate screening and preventive services including :  diabetes screening, lipid analysis with projected  10 year  risk for CAD , nutrition counseling,  and recommended immunizations.  Printed recommendations for health maintenance screenings was given      Relevant Orders   Lipid panel   Long-term use of high-risk medication   Relevant Orders   Comprehensive metabolic panel   CBC with Differential/Platelet   B12 and Folate Panel   Constipation - Primary    Currently managed with every other day  use of dulcolax.        Relevant Orders   TSH      I am having Giah E. Jahr maintain her diazepam, LaMICtal, levETIRAcetam, Linzess, pantoprazole, triamcinolone cream, diazepam, risperiDONE, and pramipexole.  No orders of the defined types were placed in this encounter.   There are no  discontinued medications.  Follow-up: No follow-ups on file.   Crecencio Mc, MD

## 2019-02-12 NOTE — Assessment & Plan Note (Signed)
She has a moderate  intellectual disability , attends afternoon daycare.   Her emotional lability is no longer requiring risperidone.  Forms signed

## 2019-02-12 NOTE — Assessment & Plan Note (Signed)
Annual comprehensive preventive exam was done as well as an evaluation and management of chronic conditions .  During the course of the visit the patient was educated and counseled about appropriate screening and preventive services including :  diabetes screening, lipid analysis with projected  10 year  risk for CAD, nutrition counseling,and recommended immunizations.  Printed recommendations for health maintenance screenings was given.  

## 2019-02-12 NOTE — Assessment & Plan Note (Signed)
She has been seizure free for nearly 2 years.  No changes to medications today

## 2019-02-12 NOTE — Patient Instructions (Signed)
Pertussis infections are RARELY without symptoms (think "whooping cough")   Tetanus is not contagious but can occur with dog or cat bites,  Wounds from falling in the dirt   If Lindsey [name] develops a whooping cough she should be seen asap and if she gets a wound,  She should get a tetanus booster   Health Maintenance, Female Adopting a healthy lifestyle and getting preventive care are important in promoting health and wellness. Ask your health care provider about:  The right schedule for you to have regular tests and exams.  Things you can do on your own to prevent diseases and keep yourself healthy. What should I know about diet, weight, and exercise? Eat a healthy diet   Eat a diet that includes plenty of vegetables, fruits, low-fat dairy products, and lean protein.  Do not eat a lot of foods that are high in solid fats, added sugars, or sodium. Maintain a healthy weight Body mass index (BMI) is used to identify weight problems. It estimates body fat based on height and weight. Your health care provider can help determine your BMI and help you achieve or maintain a healthy weight. Get regular exercise Get regular exercise. This is one of the most important things you can do for your health. Most adults should:  Exercise for at least 150 minutes each week. The exercise should increase your heart rate and make you sweat (moderate-intensity exercise).  Do strengthening exercises at least twice a week. This is in addition to the moderate-intensity exercise.  Spend less time sitting. Even light physical activity can be beneficial. Watch cholesterol and blood lipids Have your blood tested for lipids and cholesterol at 31 years of age, then have this test every 5 years. Have your cholesterol levels checked more often if:  Your lipid or cholesterol levels are high.  You are older than 31 years of age.  You are at high risk for heart disease. What should I know about cancer screening?  Depending on your health history and family history, you may need to have cancer screening at various ages. This may include screening for:  Breast cancer.  Cervical cancer.  Colorectal cancer.  Skin cancer.  Lung cancer. What should I know about heart disease, diabetes, and high blood pressure? Blood pressure and heart disease  High blood pressure causes heart disease and increases the risk of stroke. This is more likely to develop in people who have high blood pressure readings, are of African descent, or are overweight.  Have your blood pressure checked: ? Every 3-5 years if you are 50-32 years of age. ? Every year if you are 101 years old or older. Diabetes Have regular diabetes screenings. This checks your fasting blood sugar level. Have the screening done:  Once every three years after age 24 if you are at a normal weight and have a low risk for diabetes.  More often and at a younger age if you are overweight or have a high risk for diabetes. What should I know about preventing infection? Hepatitis B If you have a higher risk for hepatitis B, you should be screened for this virus. Talk with your health care provider to find out if you are at risk for hepatitis B infection. Hepatitis C Testing is recommended for:  Everyone born from [birth year] through 1963.  Anyone with known risk factors for hepatitis C. Sexually transmitted infections (STIs)  Get screened for STIs, including gonorrhea and chlamydia, if: ? You are sexually active and are younger  than 31 years of age. ? You are older than 31 years of age and your health care provider tells you that you are at risk for this type of infection. ? Your sexual activity has changed since you were last screened, and you are at increased risk for chlamydia or gonorrhea. Ask your health care provider if you are at risk.  Ask your health care provider about whether you are at high risk for HIV. Your health care provider may recommend a  prescription medicine to help prevent HIV infection. If you choose to take medicine to prevent HIV, you should first get tested for HIV. You should then be tested every 3 months for as long as you are taking the medicine. Pregnancy  If you are about to stop having your period (premenopausal) and you may become pregnant, seek counseling before you get pregnant.  Take 400 to 800 micrograms (mcg) of folic acid every day if you become pregnant.  Ask for birth control (contraception) if you want to prevent pregnancy. Osteoporosis and menopause Osteoporosis is a disease in which the bones lose minerals and strength with aging. This can result in bone fractures. If you are 70 years old or older, or if you are at risk for osteoporosis and fractures, ask your health care provider if you should:  Be screened for bone loss.  Take a calcium or vitamin D supplement to lower your risk of fractures.  Be given hormone replacement therapy (HRT) to treat symptoms of menopause. Follow these instructions at home: Lifestyle  Do not use any products that contain nicotine or tobacco, such as cigarettes, e-cigarettes, and chewing tobacco. If you need help quitting, ask your health care provider.  Do not use street drugs.  Do not share needles.  Ask your health care provider for help if you need support or information about quitting drugs. Alcohol use  Do not drink alcohol if: ? Your health care provider tells you not to drink. ? You are pregnant, may be pregnant, or are planning to become pregnant.  If you drink alcohol: ? Limit how much you use to 0-1 drink a day. ? Limit intake if you are breastfeeding.  Be aware of how much alcohol is in your drink. In the [place] one drink equals one 12 oz bottle of beer (355 mL), one 5 oz glass of wine (148 mL), or one 1 oz glass of hard liquor (44 mL). General instructions  Schedule regular health, dental, and eye exams.  Stay current with your vaccines.   Tell your health care provider if: ? You often feel depressed. ? You have ever been abused or do not feel safe at home. Summary  Adopting a healthy lifestyle and getting preventive care are important in promoting health and wellness.  Follow your health care provider's instructions about healthy diet, exercising, and getting tested or screened for diseases.  Follow your health care provider's instructions on monitoring your cholesterol and blood pressure. This information is not intended to replace advice given to you by your health care provider. Make sure you discuss any questions you have with your health care provider. Document Released: 11/06/2010 Document Revised: 04/16/2018 Document Reviewed: 04/16/2018 Elsevier Patient Education  2020 [employer].

## 2019-02-12 NOTE — Assessment & Plan Note (Signed)
Currently managed with every other day  use of dulcolax.   

## 2019-02-13 LAB — CBC WITH DIFFERENTIAL/PLATELET
Basophils Absolute: 0 10*3/uL (ref 0.0–0.1)
Basophils Relative: 0.7 % (ref 0.0–3.0)
Eosinophils Absolute: 0 10*3/uL (ref 0.0–0.7)
Eosinophils Relative: 0.1 % (ref 0.0–5.0)
HCT: 38.9 % (ref 36.0–46.0)
Hemoglobin: 13.6 g/dL (ref 12.0–15.0)
Lymphocytes Relative: 28.4 % (ref 12.0–46.0)
Lymphs Abs: 1.2 10*3/uL (ref 0.7–4.0)
MCHC: 34.8 g/dL (ref 30.0–36.0)
MCV: 81.9 fl (ref 78.0–100.0)
Monocytes Absolute: 0.3 10*3/uL (ref 0.1–1.0)
Monocytes Relative: 6.7 % (ref 3.0–12.0)
Neutro Abs: 2.8 10*3/uL (ref 1.4–7.7)
Neutrophils Relative %: 64.1 % (ref 43.0–77.0)
Platelets: 184 10*3/uL (ref 150.0–400.0)
RBC: 4.75 Mil/uL (ref 3.87–5.11)
RDW: 12.3 % (ref 11.5–15.5)
WBC: 4.4 10*3/uL (ref 4.0–10.5)

## 2019-02-24 ENCOUNTER — Other Ambulatory Visit: Payer: Self-pay | Admitting: Internal Medicine

## 2019-03-18 ENCOUNTER — Other Ambulatory Visit: Payer: Self-pay | Admitting: Internal Medicine

## 2019-03-26 IMAGING — DX DG CHEST 2V
2 series · 2 of 2 positions shown · non-contrast
Comparison: None.

CLINICAL DATA: Cough for 4 weeks.

EXAM:
CHEST  2 VIEW

[chest pa]
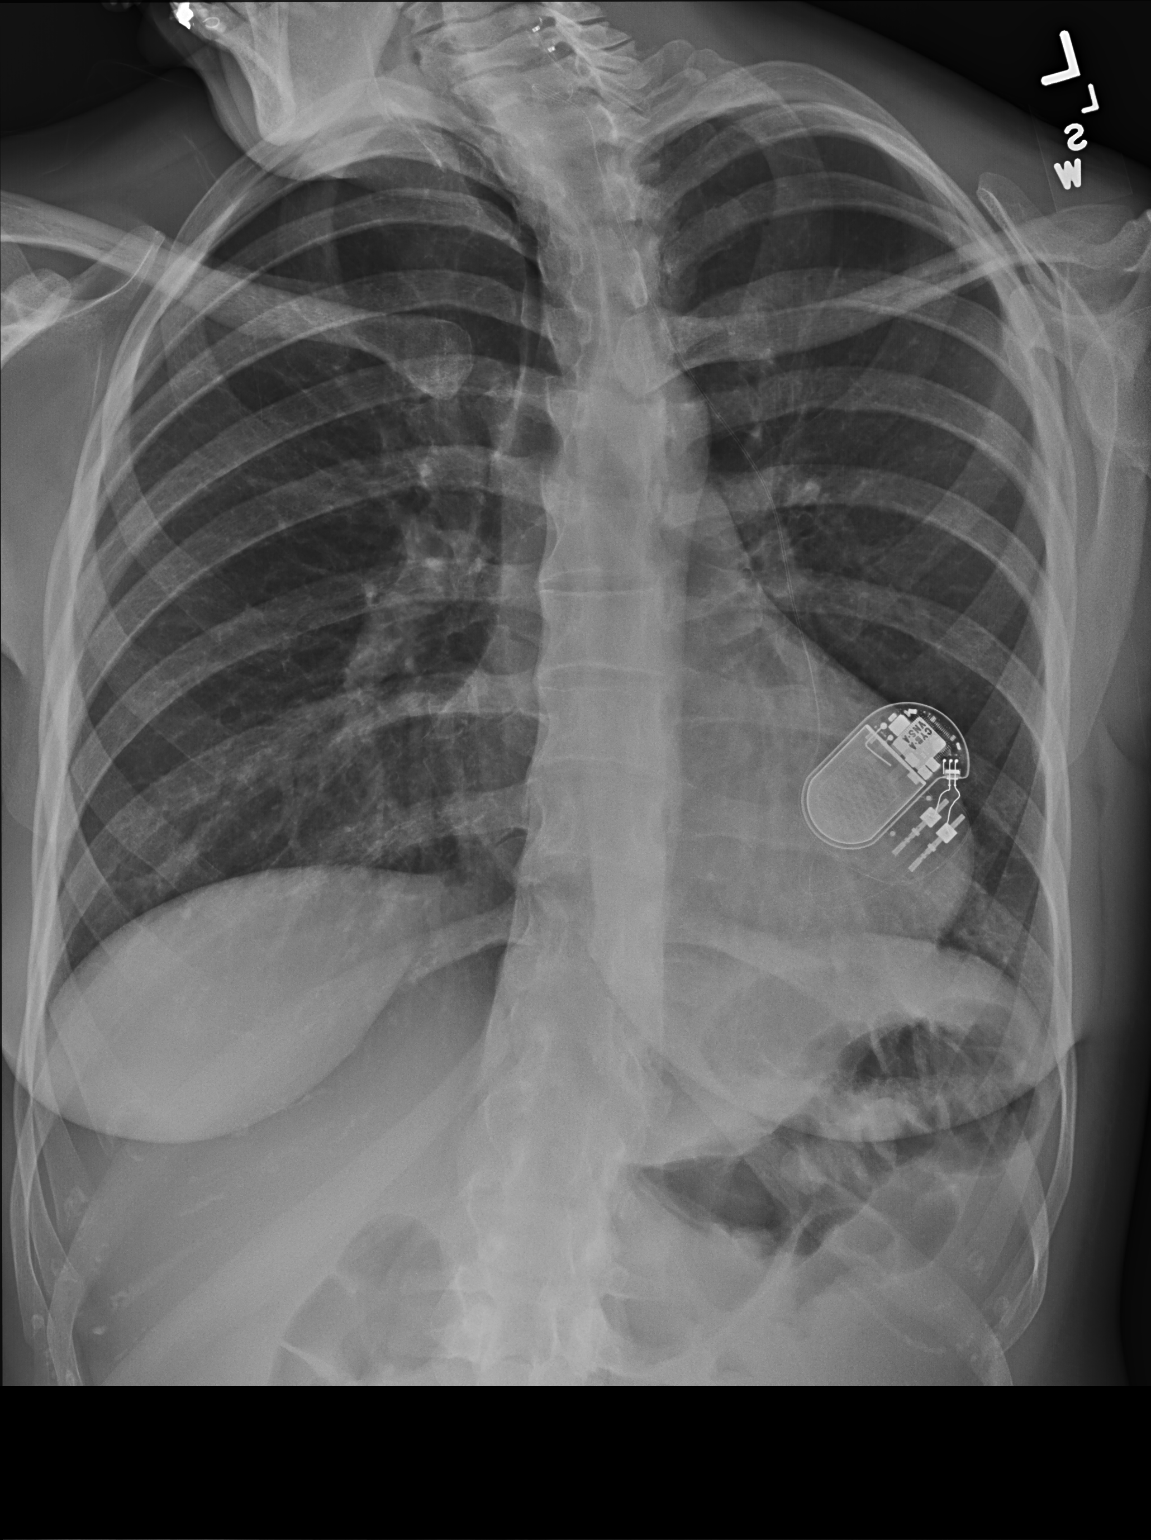

[chest lat]
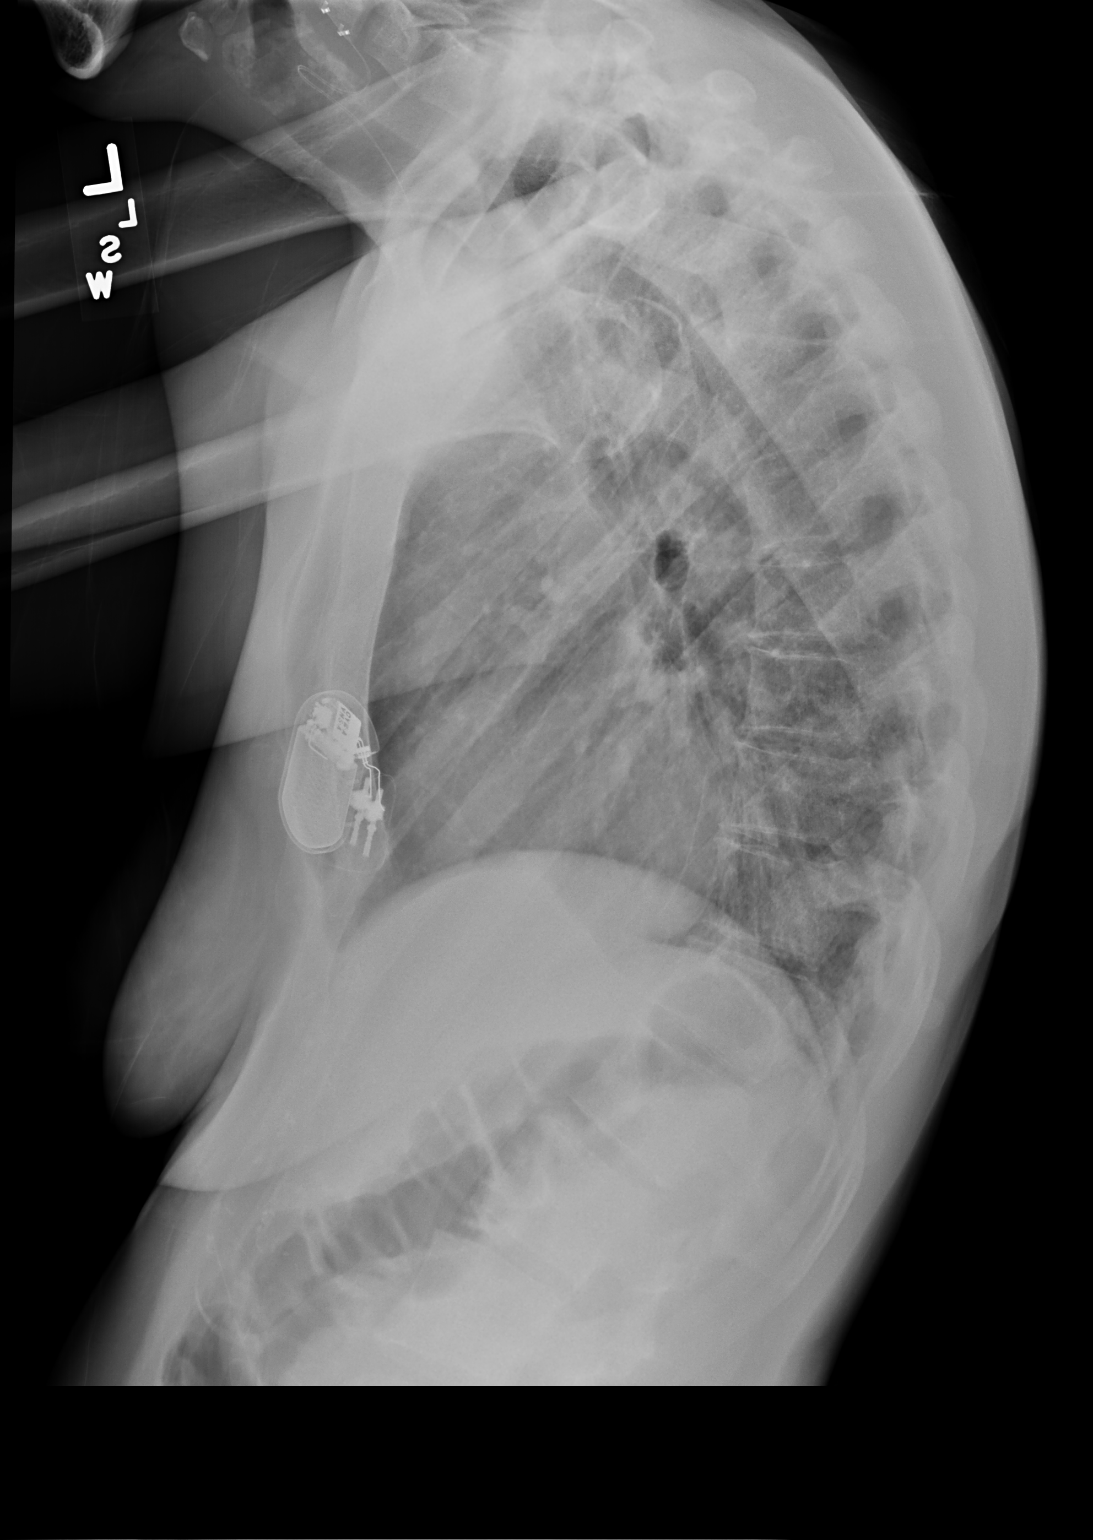

[2 of 2 positions shown; findings below may reference images not displayed]

FINDINGS: Heart size is upper normal. Hardware in place over the left chest,
presumed neurostimulator.

Lungs are clear. No pleural effusion or pneumothorax seen. No acute
or suspicious osseous finding.
IMPRESSION: No active cardiopulmonary disease. No evidence of pneumonia or
pulmonary edema.

## 2019-04-14 ENCOUNTER — Telehealth: Payer: Self-pay | Admitting: *Deleted

## 2019-04-14 NOTE — Telephone Encounter (Signed)
Copied from [place] [id number]. Topic: General - Other >> Apr 14, 2019  4:25 PM Leward Quan A wrote: Reason for CRM: Ms Lindsey [name] from [facility] sent a fax on 04/10/2019 for Dr Derrel Nip to complete and send back for patient to get some upgrade to her bathroom to help her care for herself properly. Please call [phone] Lindsey [name]

## 2019-04-15 NOTE — Telephone Encounter (Signed)
Form has been faxed as well.

## 2019-04-15 NOTE — Telephone Encounter (Signed)
Form has been completed and signed.

## 2019-05-09 IMAGING — DX DG CHEST 1V PORT
1 series · 1 of 1 positions shown · non-contrast
Comparison: 12/06/2016

CLINICAL DATA: Seizure with hypoxia today.

EXAM:
PORTABLE CHEST 1 VIEW

[chest ap]
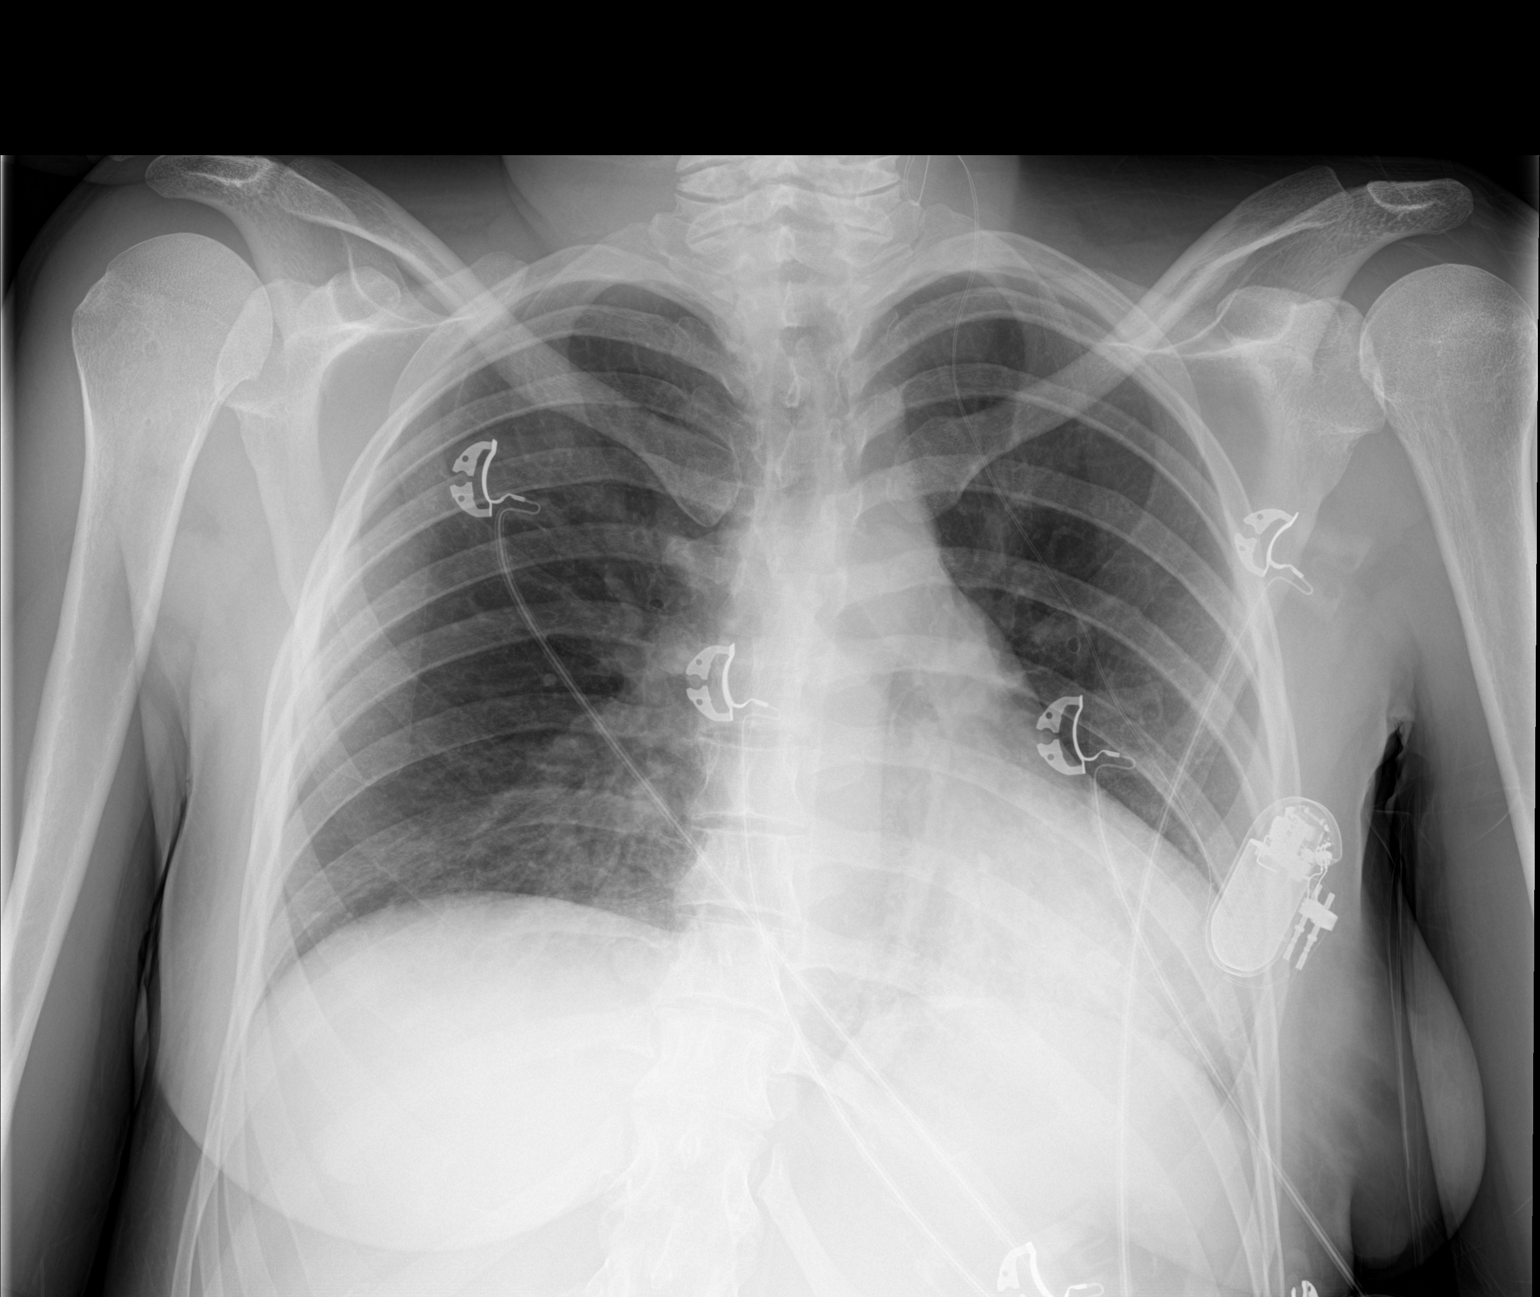

[1 of 1 positions shown; findings below may reference images not displayed]

FINDINGS: Heart is top normal. No aortic aneurysm. Vagus nerve stimulator is
noted overlying the left lower thorax with lead along the left side
of the neck to the C6 level. There is bibasilar atelectasis. No
overt pulmonary edema, effusion or pneumothorax.
IMPRESSION: Bibasilar atelectasis with borderline cardiomegaly. Vagus nerve
stimulator is in place.

## 2019-05-17 ENCOUNTER — Other Ambulatory Visit: Payer: Self-pay | Admitting: Internal Medicine

## 2019-05-18 NOTE — Telephone Encounter (Signed)
Diazepam    Refilled: 12/26/2018  Mirapex   Refilled: 03/19/2019  Last OV: 02/12/2019 Next OV: not scheduled

## 2019-07-17 NOTE — Telephone Encounter (Signed)
Pt mother called to make sure MyChart message came through

## 2019-07-20 ENCOUNTER — Other Ambulatory Visit: Payer: Self-pay | Admitting: Internal Medicine

## 2019-07-20 MED ORDER — PRAMIPEXOLE DIHYDROCHLORIDE 0.125 MG PO TABS: 0.1250 mg | ORAL_TABLET | Freq: Every day | ORAL | 0 refills | Status: DC

## 2019-08-14 ENCOUNTER — Other Ambulatory Visit: Payer: Self-pay

## 2019-08-14 ENCOUNTER — Ambulatory Visit
Admission: EM | Admit: 2019-08-14 | Discharge: 2019-08-14 | Disposition: A | Discharge status: Home / Self Care | Payer: 59 | Attending: Emergency Medicine | Admitting: Emergency Medicine

## 2019-08-14 ENCOUNTER — Telehealth: Payer: Self-pay | Admitting: Internal Medicine

## 2019-08-14 DIAGNOSIS — L509 Urticaria, unspecified: Secondary | ICD-10-CM

## 2019-08-14 MED ORDER — PREDNISONE 10 MG PO TABS: 40.0000 mg | ORAL_TABLET | Freq: Every day | ORAL | 0 refills | Status: AC

## 2019-08-14 NOTE — Discharge Instructions (Addendum)
Give your daughter the prednisone as directed.    Follow up with your primary care provider if her symptoms are not improving.

## 2019-08-14 NOTE — Telephone Encounter (Signed)
Spoke with pt's mother and she stated that they went to [facility] this morning. She stated that they treated her with prednisone.

## 2019-08-14 NOTE — Telephone Encounter (Signed)
Pt woke up and has hives on her legs. Her mom gave her a claritin and cortizone cream. She wanted to see if Dr. Darrick Huntsman had any openings but she isn't here today and no one at our location or [facility] location have any openings. I gave her the number to [facility]. I also told her that I would let her provider know.

## 2019-08-14 NOTE — ED Triage Notes (Signed)
Pt presents with mom and c/o hives on lower legs and right hip. Mom reports these were not present last night, she noticed them this morning when getting the pt dressed. She has applied hydrocortisone and given pt loratadine. She reports hives have improved slightly since then.

## 2019-08-14 NOTE — ED Provider Notes (Signed)
[name]    CSN: [id number] Arrival date & time: 08/14/19  0830      History   Chief Complaint No chief complaint on file.   HPI Lindsey [name] is a 34 y.o. female.   Accompanied by her mother, patient presents with hives on her legs since this morning.  No difficulty swallowing or breathing.  Patient has cerebral palsy and is limited in communication.  Her mother applied hydrocortisone cream and gave the patient Claritin; the hives have improved.  She states the patient cannot take Benadryl or Zyrtec because they cause her to have seizures.  No new medicines, detergents, soaps, or other products.  She denies fever, chills, sore throat, cough, shortness of breath, vomiting, diarrhea, or other symptoms.    The history is provided by the patient and a parent.    Past Medical History:  Diagnosis Date  . Epilepsy [facility]) age 34   negative metabolic workup has brain abnormalities by MRI (Dr. Lisabeth Devoid, [place])  . Insomnia   . Status post VNS (vagus nerve stimulator) placement 2000  . Temporal lobectomy behavior syndrome 1994   Right    Patient Active Problem List   Diagnosis Date Noted  . Right hip pain 06/11/2018  . Poor dentition 09/02/2017  . Cough 01/08/2017  . Constipation 11/10/2015  . Long-term use of high-risk medication 10/11/2015  . Xerosis of skin 12/12/2014  . Restless legs syndrome 07/12/2014  . Restlessness and agitation 09/10/2012  . Routine general medical examination at a health care facility 06/08/2012  . Cerebral palsy ([place]) 05/08/2012  . Acneiform dermatitis 06/30/2011  . Seizure disorder, primary ([place]) 03/23/2011    Past Surgical History:  Procedure Laterality Date  . ABDOMINAL HYSTERECTOMY  2005  . BRAIN SURGERY     Temporal Lobe resection  . CRANIOTOMY FOR TEMPORAL LOBECTOMY    . TONSILLECTOMY AND ADENOIDECTOMY  age 75    OB History   No obstetric history on file.      Home Medications    Prior to Admission medications     Medication Sig Start Date End Date Taking? Authorizing Provider  diazepam (DIASTAT ACUDIAL) 10 MG GEL Place 20 mg rectally once.   Yes [provider]  diazepam (VALIUM) 5 MG tablet TAKE 1.5 AT BEDTIME 05/18/19  Yes Crecencio Mc, MD  LAMICTAL 100 MG tablet Take 100 mg by mouth 2 (two) times daily.  08/03/16  Yes [provider]  levETIRAcetam (KEPPRA) 750 MG tablet Take 750 mg by mouth 2 (two) times daily.   Yes [provider]  LINZESS 290 MCG CAPS capsule TAKE ONE [place] Patient not taking: Reported on 11/14/2018 07/25/18   Crecencio Mc, MD  pantoprazole (PROTONIX) 40 MG tablet TAKE 1 TABLET BY MOUTH DAILY Patient not taking: Reported on 11/14/2018 07/25/18   Crecencio Mc, MD  pramipexole (MIRAPEX) 0.125 MG tablet Take 1 tablet (0.125 mg total) by mouth daily. 07/20/19   Crecencio Mc, MD  predniSONE (DELTASONE) 10 MG tablet Take 4 tablets (40 mg total) by mouth daily for 5 days. 08/14/19 08/19/19  Sharion Balloon, NP  risperiDONE (RISPERDAL) 0.5 MG tablet TAKE ONE TABLET AT BEDTIME AND 1/2 TABLET AS NEEDED 12/26/18   Crecencio Mc, MD  triamcinolone cream (KENALOG) 0.1 % Apply 1 application topically 2 (two) times daily. To affected area, and keep covered. 11/14/18   Crecencio Mc, MD    Family History History reviewed. No pertinent family history.  Social History Social History   Tobacco Use  . Smoking status: Never Smoker  . Smokeless tobacco: Never Used  Substance Use Topics  . Alcohol use: No    Alcohol/week: 0.0 standard drinks  . Drug use: No     Allergies   Influenza vac split quad, Benadryl [diphenhydramine hcl], and Zyrtec [cetirizine hcl]   Review of Systems Review of Systems  Constitutional: Negative for chills and fever.  HENT: Negative for ear pain, sore throat and trouble swallowing.   Eyes: Negative for pain and visual disturbance.  Respiratory: Negative for cough and shortness of breath.    Cardiovascular: Negative for chest pain and palpitations.  Gastrointestinal: Negative for abdominal pain, diarrhea, nausea and vomiting.  Genitourinary: Negative for dysuria and hematuria.  Musculoskeletal: Negative for arthralgias and back pain.  Skin: Positive for rash. Negative for color change.  Neurological: Negative for seizures and syncope.  All other systems reviewed and are negative.    Physical Exam Triage Vital Signs ED Triage Vitals  Enc Vitals Group     BP 08/14/19 0840 113/76     Pulse Rate 08/14/19 0840 67     Resp --      Temp 08/14/19 0840 97.8 F (36.6 C)     Temp Source 08/14/19 0840 Axillary     SpO2 08/14/19 0840 98 %     Weight 08/14/19 0837 130 lb (59 kg)     Height 08/14/19 0837 5\' 6"  (1.676 m)     Head Circumference --      Peak Flow --      Pain Score 08/14/19 0837 0     Pain Loc --      Pain Edu? --      Excl. in GC? --    No data found.  Updated Vital Signs BP 113/76 (BP Location: Left Arm)   Pulse 67   Temp 97.8 F (36.6 C) (Axillary)   Ht 5\' 6"  (1.676 m)   Wt 130 lb (59 kg)   LMP 01/09/2011   SpO2 98%   BMI 20.98 kg/m   Visual Acuity Right Eye Distance:   Left Eye Distance:   Bilateral Distance:    Right Eye Near:   Left Eye Near:    Bilateral Near:     Physical Exam Vitals and nursing note reviewed.  Constitutional:      General: She is not in acute distress.    Appearance: She is well-developed. She is not ill-appearing.  HENT:     Head: Normocephalic and atraumatic.     Mouth/Throat:     Mouth: Mucous membranes are moist.     Pharynx: Oropharynx is clear.  Eyes:     Conjunctiva/sclera: Conjunctivae normal.  Cardiovascular:     Rate and Rhythm: Normal rate and regular rhythm.     Heart sounds: Normal heart sounds. No murmur.  Pulmonary:     Effort: Pulmonary effort is normal. No respiratory distress.     Breath sounds: Normal breath sounds. No wheezing or rhonchi.  Abdominal:     General: Bowel sounds are  normal.     Palpations: Abdomen is soft.     Tenderness: There is no abdominal tenderness. There is no guarding or rebound.  Musculoskeletal:     Cervical back: Neck supple.  Skin:    General: Skin is warm and dry.     Findings: Rash present. No bruising or erythema.     Comments: Hives on bilateral LE and on right hip. No open  wounds.    Neurological:     Mental Status: She is alert. Mental status is at baseline.      UC Treatments / Results  Labs (all labs ordered are listed, but only abnormal results are displayed) Labs Reviewed - No data to display  EKG   Radiology No results found.  Procedures Procedures (including critical care time)  Medications Ordered in UC Medications - No data to display  Initial Impression / Assessment and Plan / UC Course  I have reviewed the triage vital signs and the nursing notes.  Pertinent labs & imaging results that were available during my care of the patient were reviewed by me and considered in my medical decision making (see chart for details).   Hives.  Treating with prednisone.  Instructed mother to continue Claritin.  Instructed her to follow up with her PCP if her symptoms are not improving; Go to ED if she has difficulty swallowing or breathing.  Mother and patient agree to plan of care.     Final Clinical Impressions(s) / UC Diagnoses   Final diagnoses:  Hives     Discharge Instructions     Give your daughter the prednisone as directed.    Follow up with your primary care provider if her symptoms are not improving.        ED Prescriptions    Medication Sig Dispense Auth. Provider   predniSONE (DELTASONE) 10 MG tablet Take 4 tablets (40 mg total) by mouth daily for 5 days. 20 tablet Mickie Bail, NP     PDMP not reviewed this encounter.   Mickie Bail, NP 08/14/19 0900

## 2019-08-21 ENCOUNTER — Telehealth: Payer: Self-pay | Admitting: Internal Medicine

## 2019-08-21 NOTE — Telephone Encounter (Signed)
err

## 2019-08-24 ENCOUNTER — Ambulatory Visit (INDEPENDENT_AMBULATORY_CARE_PROVIDER_SITE_OTHER): Payer: 59 | Admitting: Internal Medicine

## 2019-08-24 ENCOUNTER — Other Ambulatory Visit: Payer: Self-pay

## 2019-08-24 ENCOUNTER — Other Ambulatory Visit: Payer: Self-pay | Admitting: Internal Medicine

## 2019-08-24 ENCOUNTER — Encounter: Payer: Self-pay | Admitting: Internal Medicine

## 2019-08-24 VITALS — BP 90/70 | HR 51 | Temp 97.9°F | Resp 15 | Ht 66.0 in | Wt 114.2 lb

## 2019-08-24 DIAGNOSIS — G809 Cerebral palsy, unspecified: Secondary | ICD-10-CM

## 2019-08-24 DIAGNOSIS — R232 Flushing: Secondary | ICD-10-CM | POA: Diagnosis not present

## 2019-08-24 DIAGNOSIS — R451 Restlessness and agitation: Secondary | ICD-10-CM

## 2019-08-24 DIAGNOSIS — G40119 Localization-related (focal) (partial) symptomatic epilepsy and epileptic syndromes with simple partial seizures, intractable, without status epilepticus: Secondary | ICD-10-CM

## 2019-08-24 DIAGNOSIS — G2581 Restless legs syndrome: Secondary | ICD-10-CM

## 2019-08-24 MED ORDER — DIAZEPAM 10 MG PO TABS: 10.0000 mg | ORAL_TABLET | Freq: Two times a day (BID) | ORAL | 5 refills | Status: DC | PRN

## 2019-08-24 MED ORDER — PAROXETINE HCL 10 MG PO TABS: 10.0000 mg | ORAL_TABLET | Freq: Every day | ORAL | 0 refills | Status: DC
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
## 2019-08-24 NOTE — Patient Instructions (Addendum)
Continue melatonin but take it at dinner time.  You can use up to 5 mg  paxil start at 1/2 tablet daily for the first few days up to one week. , then full tablet thereafter.  Stay at full tablet for two weeks    Increase diazepam to 10 mg at bedtime

## 2019-08-24 NOTE — Telephone Encounter (Signed)
Refill request for valium, last seen 02-12-19 , last filled 05-17-18.  Please advise.

## 2019-08-25 LAB — URINALYSIS, ROUTINE W REFLEX MICROSCOPIC
Bilirubin Urine: NEGATIVE
Ketones, ur: NEGATIVE
Leukocytes,Ua: NEGATIVE
Nitrite: NEGATIVE
Specific Gravity, Urine: 1.02 (ref 1.000–1.030)
Total Protein, Urine: NEGATIVE
Urine Glucose: NEGATIVE
Urobilinogen, UA: 0.2 (ref 0.0–1.0)
pH: 7.5 (ref 5.0–8.0)

## 2019-08-25 LAB — COMPREHENSIVE METABOLIC PANEL
ALT: 7 U/L (ref 0–35)
AST: 13 U/L (ref 0–37)
Albumin: 4.4 g/dL (ref 3.5–5.2)
Alkaline Phosphatase: 53 U/L (ref 39–117)
BUN: 10 mg/dL (ref 6–23)
CO2: 27 mEq/L (ref 19–32)
Calcium: 9.5 mg/dL (ref 8.4–10.5)
Chloride: 102 mEq/L (ref 96–112)
Creatinine, Ser: 0.68 mg/dL (ref 0.40–1.20)
GFR: 100.22 mL/min (ref >60.00)
Glucose, Bld: 93 mg/dL (ref 70–99)
Potassium: 3.8 mEq/L (ref 3.5–5.1)
Sodium: 135 mEq/L (ref 135–145)
Total Bilirubin: 0.3 mg/dL (ref 0.2–1.2)
Total Protein: 7.1 g/dL (ref 6.0–8.3)

## 2019-08-25 LAB — CBC WITH DIFFERENTIAL/PLATELET
Basophils Absolute: 0 10*3/uL (ref 0.0–0.1)
Basophils Relative: 1 % (ref 0.0–3.0)
Eosinophils Absolute: 0 10*3/uL (ref 0.0–0.7)
Eosinophils Relative: 0 % (ref 0.0–5.0)
HCT: 40.3 % (ref 36.0–46.0)
Hemoglobin: 13.9 g/dL (ref 12.0–15.0)
Lymphocytes Relative: 37.8 % (ref 12.0–46.0)
Lymphs Abs: 1.7 10*3/uL (ref 0.7–4.0)
MCHC: 34.4 g/dL (ref 30.0–36.0)
MCV: 84.6 fl (ref 78.0–100.0)
Monocytes Absolute: 0.4 10*3/uL (ref 0.1–1.0)
Monocytes Relative: 9.5 % (ref 3.0–12.0)
Neutro Abs: 2.4 10*3/uL (ref 1.4–7.7)
Neutrophils Relative %: 51.7 % (ref 43.0–77.0)
Platelets: 168 10*3/uL (ref 150.0–400.0)
RBC: 4.77 Mil/uL (ref 3.87–5.11)
RDW: 12.2 % (ref 11.5–15.5)
WBC: 4.6 10*3/uL (ref 4.0–10.5)

## 2019-08-25 LAB — LUTEINIZING HORMONE: LH: 4.1 m[IU]/mL

## 2019-08-25 LAB — FOLLICLE STIMULATING HORMONE: FSH: 5.9 m[IU]/mL

## 2019-08-25 LAB — URINE CULTURE
MICRO NUMBER:: 10379044
Result:: NO GROWTH
SPECIMEN QUALITY:: ADEQUATE

## 2019-08-25 LAB — TSH: TSH: 1.84 u[IU]/mL (ref 0.35–4.50)

## 2019-08-25 NOTE — Assessment & Plan Note (Signed)
She has been seizure free for over 20 months .  Continue lamictal and Keppra.  lamictal dose reduce gradually by Dr Quintin [facility]) due to tremor reported by mother which is still present on today's visit

## 2019-08-25 NOTE — Assessment & Plan Note (Signed)
May be secondary to reduced lamictal dose vs menopause vs infection  vs anxiety due to restricted socialization.  Trial of Paroxetine 10 mg .  Labs pending. Follow up one month .

## 2019-08-25 NOTE — Assessment & Plan Note (Signed)
Her mother reports intolerance to mirapex but may need to try again since tremor has not stopped and nighttime agitation is worsening.  Will increase valium to 10 mg from 7.5 mg as a trial

## 2019-08-25 NOTE — Assessment & Plan Note (Signed)
With intellectual disability and seizure disorder.  History of temporal lobe resection

## 2019-08-31 ENCOUNTER — Telehealth: Payer: Self-pay | Admitting: Internal Medicine

## 2019-08-31 DIAGNOSIS — H9202 Otalgia, left ear: Secondary | ICD-10-CM

## 2019-08-31 NOTE — Telephone Encounter (Signed)
Mother returning call concerning ENT,needing referral ASAP

## 2019-08-31 NOTE — Telephone Encounter (Signed)
Spoke with pt's mother and informed her that the referral has been placed.

## 2019-08-31 NOTE — Telephone Encounter (Signed)
Noted. I will place the referral. I will forward to [place] to send it over to them ASAP as she has an appointment this afternoon.

## 2019-08-31 NOTE — Telephone Encounter (Signed)
Spoke with pt's mother to offer her an appt with Dr. Birdie Sons or another provider in the office. Mother stated that she did not want to see anyone here she just wanted a referral placed so her daughter could see the PA at [facility]. She stated that she has already spoken with them and they will see her this afternoon if she can get an urgent referral sent over. The pt is having pain in the left ear that has been occurring all weekend.

## 2019-08-31 NOTE — Telephone Encounter (Signed)
PT's mother states that she has had an earache in her left ear all weekend. She called ENT and they said they could not see her without a referral. Mother states that [facility] [facility] could see pt this afternoon if we get a referral sent ASAP. She states that if that cant happen then she will take PT to Urgent care. Please call back asap to advise

## 2019-09-21 ENCOUNTER — Other Ambulatory Visit: Payer: Self-pay

## 2019-09-23 ENCOUNTER — Encounter: Payer: Self-pay | Admitting: Internal Medicine

## 2019-09-23 ENCOUNTER — Other Ambulatory Visit: Payer: Self-pay

## 2019-09-23 ENCOUNTER — Ambulatory Visit (INDEPENDENT_AMBULATORY_CARE_PROVIDER_SITE_OTHER): Payer: 59 | Admitting: Internal Medicine

## 2019-09-23 DIAGNOSIS — G2581 Restless legs syndrome: Secondary | ICD-10-CM | POA: Diagnosis not present

## 2019-09-23 MED ORDER — ROPINIROLE HCL 0.25 MG PO TABS: 0.2500 mg | ORAL_TABLET | Freq: Three times a day (TID) | ORAL | 0 refills | Status: DC

## 2019-09-23 NOTE — Progress Notes (Signed)
Subjective:  Patient ID: Lindsey [name], female    DOB: Apr 12, 1986  Age: 34 y.o. MRN: [id number]  CC: The encounter diagnosis was Restless legs syndrome.  HPI Lindsey [name] presents for one month follow up on agitation/anxiety  This visit occurred during the SARS-CoV-2 public health emergency.  Safety protocols were in place, including screening questions prior to the visit, additional usage of staff PPE, and extensive cleaning of exam room while observing appropriate contact time as indicated for disinfecting solutions.   Paxil 10 mg started one month ago, at 5 pm with 5 mg melatonin  And diazepam dose increased from 7.5 mg to 19 mg at night.  The medication change  Has improved many nights of sleep  However still has 2 or 3 nights of agitation related to RLS. Marland Kitchen  No prior trial of requip,  Did not tolerate mirapex.    Weight stable but still underweight Body mass index is 18.43 kg/m.  Appetite Is good   Outpatient Medications Prior to Visit  Medication Sig Dispense Refill  . diazepam (DIASTAT ACUDIAL) 10 MG GEL Place 20 mg rectally once.    . diazepam (VALIUM) 10 MG tablet Take 1 tablet (10 mg total) by mouth every 12 (twelve) hours as needed for anxiety. 30 tablet 5  . LAMICTAL 100 MG tablet Take 100 mg by mouth 2 (two) times daily.     Marland Kitchen levETIRAcetam (KEPPRA) 750 MG tablet Take 750 mg by mouth 2 (two) times daily.    Marland Kitchen PARoxetine (PAXIL) 10 MG tablet Take 1 tablet (10 mg total) by mouth daily. 90 tablet 0   No facility-administered medications prior to visit.    Review of Systems;  Per mother,  Patient denies headache, fevers, malaise, unintentional weight loss, skin rash, eye pain, sinus congestion and sinus pain, sore throat, dysphagia,  hemoptysis , cough, dyspnea, wheezing, chest pain, palpitations, orthopnea, edema, abdominal pain, nausea, melena, diarrhea, constipation, flank pain, dysuria, hematuria, urinary  Frequency, nocturia, numbness, tingling, seizures,  Focal  weakness, Loss of consciousness,  Tremor, insomnia, depression, and suicidal ideation.      Objective:  BP 102/70 (BP Location: Left Arm, Patient Position: Sitting, Cuff Size: Normal)   Pulse 75   Temp 98.3 F (36.8 C) (Temporal)   Resp 16   Ht 5\' 6"  (1.676 m)   Wt 114 lb 3.2 oz (51.8 kg)   LMP 01/09/2011   SpO2 99%   BMI 18.43 kg/m   BP Readings from Last 3 Encounters:  09/23/19 102/70  08/24/19 90/70  08/14/19 113/76    Wt Readings from Last 3 Encounters:  09/23/19 114 lb 3.2 oz (51.8 kg)  08/24/19 114 lb 3.2 oz (51.8 kg)  08/14/19 130 lb (59 kg)    General appearance: alert, minimally cooperative and appears stated age Ears: normal TM's and external ear canals both ears Throat: lips, mucosa, and tongue normal; teeth and gums normal Neck: no adenopathy, no carotid bruit, supple, symmetrical, trachea midline and thyroid not enlarged, symmetric, no tenderness/mass/nodules Back: symmetric, no curvature. ROM normal. No CVA tenderness. Lungs: clear to auscultation bilaterally Heart: regular rate and rhythm, S1, S2 normal, no murmur, click, rub or gallop Abdomen: soft, non-tender; bowel sounds normal; no masses,  no organomegaly Pulses: 2+ and symmetric Skin: Skin color, texture, turgor normal. No rashes or lesions Lymph nodes: Cervical, supraclavicular, and axillary nodes normal. Neuro:  awake and interactive with anxious mood and affect. Higher cortical functions are impaired . Speech is not clear and she  is unable to articulate how she feels . Extraocular movements are intact.  Sensation to light touch is grossly intact bilaterally of upper and lower extremities. Motor examination shows 4+/5 symmetric hand grip and upper extremity and 5/5 lower extremity strength. There is no pronation or drift. Gait is non-ataxic    No results found for: HGBA1C  Lab Results  Component Value Date   CREATININE 0.68 08/24/2019   CREATININE 0.58 02/12/2019   CREATININE 0.57 03/10/2018     Lab Results  Component Value Date   WBC 4.6 08/24/2019   HGB 13.9 08/24/2019   HCT 40.3 08/24/2019   PLT 168.0 08/24/2019   GLUCOSE 93 08/24/2019   CHOL 113 02/12/2019   TRIG 59.0 02/12/2019   HDL 36.60 (L) 02/12/2019   LDLCALC 65 02/12/2019   ALT 7 08/24/2019   AST 13 08/24/2019   NA 135 08/24/2019   K 3.8 08/24/2019   CL 102 08/24/2019   CREATININE 0.68 08/24/2019   BUN 10 08/24/2019   CO2 27 08/24/2019   TSH 1.84 08/24/2019    No results found.  Assessment & Plan:   Problem List Items Addressed This Visit      Unprioritized   Restless legs syndrome    Trial of requip starting at 0.25 mg qhs dose.directions to mother for titration of dose .  Continue 10 mg valium and 10 mg paxil . RTC one month          I am having Lindsey [name] start on rOPINIRole. I am also having her maintain her diazepam, LaMICtal, levETIRAcetam, diazepam, and PARoxetine.  Meds ordered this encounter  Medications  . rOPINIRole (REQUIP) 0.25 MG tablet    Sig: Take 1 tablet (0.25 mg total) by mouth 3 (three) times daily.    Dispense:  90 tablet    Refill:  0    There are no discontinued medications.  Follow-up: Return in about 4 weeks (around 10/21/2019).   Sherlene Shams, MD

## 2019-09-23 NOTE — Patient Instructions (Addendum)
Starting Requip;  Take it  30 minutes before  bedtime 0.25 for restless legs  If,  after one week she still restless at night,  ok to increase to 0.5 mg (2 tablets)   Follow up one month

## 2019-09-24 ENCOUNTER — Emergency Department
Admission: EM | Admit: 2019-09-24 | Discharge: 2019-09-24 | Disposition: A | Discharge status: Home / Self Care | Payer: 59 | Attending: Emergency Medicine | Admitting: Emergency Medicine

## 2019-09-24 ENCOUNTER — Encounter: Payer: Self-pay | Admitting: *Deleted

## 2019-09-24 DIAGNOSIS — R220 Localized swelling, mass and lump, head: Secondary | ICD-10-CM | POA: Insufficient documentation

## 2019-09-24 DIAGNOSIS — G809 Cerebral palsy, unspecified: Secondary | ICD-10-CM | POA: Diagnosis not present

## 2019-09-24 DIAGNOSIS — T50905A Adverse effect of unspecified drugs, medicaments and biological substances, initial encounter: Secondary | ICD-10-CM | POA: Insufficient documentation

## 2019-09-24 DIAGNOSIS — Z79899 Other long term (current) drug therapy: Secondary | ICD-10-CM | POA: Diagnosis not present

## 2019-09-24 NOTE — ED Notes (Signed)
Discussed pt with RN first, pt has a Benadryl allergy thus med was not given.  No swelling to tongue noted, no acute distress.  Mother with pt and advised to notify nursing staff with any changes.

## 2019-09-24 NOTE — ED Notes (Signed)
Pt mom reports that pt started on Requip yesterday and today she noticed some swelling to her top lip and left cheek. No difficulty breathing.

## 2019-09-24 NOTE — ED Notes (Signed)
Pt mom verbalizes understanding of d/c instructions and follow up 

## 2019-09-24 NOTE — ED Provider Notes (Signed)
Partial  [facility] Emergency Department Provider Note   ____________________________________________   First MD Initiated Contact with Patient 09/24/19 2232     (approximate)  I have reviewed the triage vital signs and the nursing notes.   HISTORY  Chief Complaint Facial Swelling    HPI Lindsey [name] is a 34 y.o. female with past medical history of cerebral palsy, partial epilepsy, and restless leg syndrome presents to the ED complaining of facial swelling.  Majority of history is obtained from patient's mother given patient's cognitive deficits.  Patient's mother states that she noticed some swelling along the left side of patient's face earlier today.  Area does not seem painful or itchy, mom has not noticed any redness or drainage from the area either.  She is concerned it could be related to the Requip that the patient started taking yesterday.  She was prescribed this by her PCP for restless leg syndrome.  Patient has not had any dental issues and has not had any difficulty swallowing or opening her jaw.  She has not had any hives, vomiting, diarrhea, or difficulty breathing.        Past Medical History:  Diagnosis Date  . Epilepsy [facility]) age 76   negative metabolic workup has brain abnormalities by MRI (Dr. Quintin Alto, [facility])  . Insomnia   . Status post VNS (vagus nerve stimulator) placement 2000  . Temporal lobectomy behavior syndrome 1994   Right    Patient Active Problem List   Diagnosis Date Noted  . Right hip pain 06/11/2018  . Poor dentition 09/02/2017  . Cough 01/08/2017  . Constipation 11/10/2015  . Long-term use of high-risk medication 10/11/2015  . Xerosis of skin 12/12/2014  . Restless legs syndrome 07/12/2014  . Restlessness and agitation 09/10/2012  . Routine general medical examination at a health care facility 06/08/2012  . Cerebral palsy (HCC) 05/08/2012  . Acneiform dermatitis 06/30/2011  . Partial epilepsy with intractable  epilepsy (HCC) 03/23/2011    Past Surgical History:  Procedure Laterality Date  . ABDOMINAL HYSTERECTOMY  2005  . BRAIN SURGERY     Temporal Lobe resection  . CRANIOTOMY FOR TEMPORAL LOBECTOMY    . TONSILLECTOMY AND ADENOIDECTOMY  age 60    Prior to Admission medications   Medication Sig Start Date End Date Taking? Authorizing Provider  diazepam (DIASTAT ACUDIAL) 10 MG GEL Place 20 mg rectally once.    [provider]  diazepam (VALIUM) 10 MG tablet Take 1 tablet (10 mg total) by mouth every 12 (twelve) hours as needed for anxiety. 08/24/19   Sherlene Shams, MD  LAMICTAL 100 MG tablet Take 100 mg by mouth 2 (two) times daily.  08/03/16   [provider]  levETIRAcetam (KEPPRA) 750 MG tablet Take 750 mg by mouth 2 (two) times daily.    [provider]  PARoxetine (PAXIL) 10 MG tablet Take 1 tablet (10 mg total) by mouth daily. 08/24/19   Sherlene Shams, MD  rOPINIRole (REQUIP) 0.25 MG tablet Take 1 tablet (0.25 mg total) by mouth 3 (three) times daily. 09/23/19   Sherlene Shams, MD    Allergies Influenza vac split quad, Benadryl [diphenhydramine hcl], and Zyrtec [cetirizine hcl]  No family history on file.  Social History Social History   Tobacco Use  . Smoking status: Never Smoker  . Smokeless tobacco: Never Used  Substance Use Topics  . Alcohol use: No    Alcohol/week: 0.0 standard drinks  . Drug use: No  Review of Systems  Constitutional: No fever/chills Eyes: No visual changes. ENT: No sore throat.  Positive for facial swelling. Cardiovascular: Denies chest pain. Respiratory: Denies shortness of breath. Gastrointestinal: No abdominal pain.  No nausea, no vomiting.  No diarrhea.  No constipation. Genitourinary: Negative for dysuria. Musculoskeletal: Negative for back pain. Skin: Negative for rash. Neurological: Negative for headaches, focal weakness or numbness.  ____________________________________________   PHYSICAL EXAM:  VITAL  SIGNS: ED Triage Vitals  Enc Vitals Group     BP 09/24/19 1941 (!) 90/58     Pulse Rate 09/24/19 1941 (!) 49     Resp 09/24/19 1941 16     Temp 09/24/19 1941 98 F (36.7 C)     Temp Source 09/24/19 1941 Oral     SpO2 09/24/19 1941 98 %     Weight 09/24/19 1942 114 lb (51.7 kg)     Height --      Head Circumference --      Peak Flow --      Pain Score 09/24/19 1942 0     Pain Loc --      Pain Edu? --      Excl. in [place]? --     Constitutional: Alert and oriented. Eyes: Conjunctivae are normal. Head: Atraumatic.  Mild edema noted to left cheek with no erythema, warmth, or tenderness. Nose: No congestion/rhinnorhea. Mouth/Throat: Mucous membranes are moist.  Dentition intact without any oral tenderness. Neck: Normal ROM Cardiovascular: Normal rate, regular rhythm. Grossly normal heart sounds. Respiratory: Normal respiratory effort.  No retractions. Lungs CTAB. Gastrointestinal: Soft and nontender. No distention. Genitourinary: deferred Musculoskeletal: No lower extremity tenderness nor edema. Neurologic:  Normal speech and language. No gross focal neurologic deficits are appreciated. Skin:  Skin is warm, dry and intact. No rash noted. Psychiatric: Mood and affect are normal. Speech and behavior are normal.  ____________________________________________   LABS (all labs ordered are listed, but only abnormal results are displayed)  Labs Reviewed - No data to display   PROCEDURES  Procedure(s) performed (including Critical Care):  Procedures   ____________________________________________   INITIAL IMPRESSION / ASSESSMENT AND PLAN / ED COURSE       34 year old female with history of cerebral palsy, partial seizures, and restless leg syndrome presents to the ED with left-sided facial swelling after starting on Requip yesterday.  Facial swelling is relatively mild and there are no findings to suggest an infectious process.  Her dentition is intact and swelling is limited  to her left cheek.  There are no signs of major allergic reaction or anaphylaxis.  It is certainly possible that the swelling is due to the Requip and I have advised mother to stop the medication.  She was given Benadryl here in the ED and mother was counseled to use this as needed at home, follow-up with PCP, and return to the ED for new or worsening symptoms.  Mother agrees with plan.      ____________________________________________   FINAL CLINICAL IMPRESSION(S) / ED DIAGNOSES  Final diagnoses:  Facial swelling  Adverse effect of drug, initial encounter     ED Discharge Orders    None       Note:  This document was prepared using Dragon voice recognition software and may include unintentional dictation errors.   Blake Divine, MD 09/24/19 [phone]

## 2019-09-24 NOTE — Assessment & Plan Note (Addendum)
Trial of requip starting at 0.25 mg qhs dose.directions to mother for titration of dose .  Continue 10 mg valium and 10 mg paxil . RTC one month

## 2019-09-24 NOTE — ED Triage Notes (Signed)
Pt is here due to swelling to the left side of her face.  Pt has swelling to the left side of her lips and to the area next to her moth, area seems to be still soft and non tender to touch.  Mother wonders if this is an allergic reaction to her new med requip which she started on yesterday.  Pt is not in any distress at this time.  Mother who is legal guardian is with her and attentive.

## 2019-09-25 ENCOUNTER — Telehealth: Payer: Self-pay | Admitting: Internal Medicine

## 2019-09-25 ENCOUNTER — Other Ambulatory Visit: Payer: Self-pay

## 2019-09-25 ENCOUNTER — Ambulatory Visit
Admission: EM | Admit: 2019-09-25 | Discharge: 2019-09-25 | Disposition: A | Discharge status: Home / Self Care | Payer: 59

## 2019-09-25 DIAGNOSIS — K0889 Other specified disorders of teeth and supporting structures: Secondary | ICD-10-CM

## 2019-09-25 DIAGNOSIS — R220 Localized swelling, mass and lump, head: Secondary | ICD-10-CM | POA: Diagnosis not present

## 2019-09-25 MED ORDER — AMOXICILLIN 875 MG PO TABS: 875.0000 mg | ORAL_TABLET | Freq: Two times a day (BID) | ORAL | 0 refills | Status: AC

## 2019-09-25 NOTE — Telephone Encounter (Signed)
Spoken to mother, Requip was stopped. Swollen on left side of face. Patient was was given antibiotics at [facility]. Patients mother stated ED never gave any medication in the ED. She is allergic to benadryl. Mother gave pt Claritin and  swelling is down. Patient doesn't need anything as of right now. They are heading to the beach this weekend, they will keep an eye on it for the next week.

## 2019-09-25 NOTE — ED Triage Notes (Signed)
Per mother, pt is having left-sided facial swelling and pain x 1 day. Per mother, the symptoms started after taking Requip. Pt was seen at the ED last night and the swelling and pain have not improved since then.

## 2019-09-25 NOTE — Telephone Encounter (Signed)
It appears she was in ER yesterday and Urgent Care  today - just evaluated.  Please call and confirm doing ok.  Will need f/u scheduled.  Let me know if needs anything more urgent.

## 2019-09-25 NOTE — Telephone Encounter (Signed)
FYI:  Pt doing better  

## 2019-09-25 NOTE — Telephone Encounter (Signed)
FYI- after patient started requip, she had mild facial swelling. ED provider told them to quit taking the medication.

## 2019-09-25 NOTE — ED Provider Notes (Signed)
Lindsey [name]    CSN: [id number] Arrival date & time: 09/25/19  8032      History   Chief Complaint Chief Complaint  Patient presents with  . Facial Swelling    HPI Lindsey [name] is a 34 y.o. female.   Accompanied by her mother, patient presents with left side facial swelling and pain x 1 day.  She started Requip yesterday which was prescribed by her PCP for restless leg syndrome.  Patient's medical history includes cerebral palsy and epilepsy.  Patient was seen in the ED last night; diagnosed with facial swelling and adverse effect of drug; mother instructed to treat with Benadryl but mother reports patient has allergy to Benadryl (causes seizures); mother gave patient Claritin this morning.  Mother reports ongoing swelling and now patient has pain in left lower jaw.  No difficulty swallowing or breathing.  Mother reports patient ate her breakfast well this morning.  No fever.   The history is provided by the patient and a parent.    Past Medical History:  Diagnosis Date  . Epilepsy [facility]) age 67   negative metabolic workup has brain abnormalities by MRI (Dr. Lisabeth [name], [place])  . Insomnia   . Status post VNS (vagus nerve stimulator) placement 2000  . Temporal lobectomy behavior syndrome 1994   Right    Patient Active Problem List   Diagnosis Date Noted  . Right hip pain 06/11/2018  . Poor dentition 09/02/2017  . Cough 01/08/2017  . Constipation 11/10/2015  . Long-term use of high-risk medication 10/11/2015  . Xerosis of skin 12/12/2014  . Restless legs syndrome 07/12/2014  . Restlessness and agitation 09/10/2012  . Routine general medical examination at a health care facility 06/08/2012  . Cerebral palsy (Lindsey [name]) 05/08/2012  . Acneiform dermatitis 06/30/2011  . Partial epilepsy with intractable epilepsy (Lindsey [name]) 03/23/2011    Past Surgical History:  Procedure Laterality Date  . ABDOMINAL HYSTERECTOMY  2005  . BRAIN SURGERY     Temporal Lobe resection  .  CRANIOTOMY FOR TEMPORAL LOBECTOMY    . TONSILLECTOMY AND ADENOIDECTOMY  age 34    OB History   No obstetric history on file.      Home Medications    Prior to Admission medications   Medication Sig Start Date End Date Taking? Authorizing Provider  melatonin 5 MG TABS Take 5 mg by mouth.   Yes [provider]  amoxicillin (AMOXIL) 875 MG tablet Take 1 tablet (875 mg total) by mouth 2 (two) times daily for 7 days. 09/25/19 10/02/19  Sharion Balloon, NP  diazepam (DIASTAT ACUDIAL) 10 MG GEL Place 20 mg rectally once.    [provider]  diazepam (VALIUM) 10 MG tablet Take 1 tablet (10 mg total) by mouth every 12 (twelve) hours as needed for anxiety. 08/24/19   Crecencio Mc, MD  LAMICTAL 100 MG tablet Take 100 mg by mouth 2 (two) times daily.  08/03/16   [provider]  levETIRAcetam (KEPPRA) 750 MG tablet Take 750 mg by mouth 2 (two) times daily.    [provider]  PARoxetine (PAXIL) 10 MG tablet Take 1 tablet (10 mg total) by mouth daily. 08/24/19   Crecencio Mc, MD  rOPINIRole (REQUIP) 0.25 MG tablet Take 1 tablet (0.25 mg total) by mouth 3 (three) times daily. 09/23/19   Crecencio Mc, MD    Family History History reviewed. No pertinent family history.  Social History Social History   Tobacco Use  . Smoking status:  Never Smoker  . Smokeless tobacco: Never Used  Substance Use Topics  . Alcohol use: No    Alcohol/week: 0.0 standard drinks  . Drug use: No     Allergies   Influenza vac split quad, Benadryl [diphenhydramine hcl], and Zyrtec [cetirizine hcl]   Review of Systems Review of Systems  Constitutional: Negative for chills and fever.  HENT: Positive for dental problem and facial swelling. Negative for ear pain, sore throat and trouble swallowing.   Eyes: Negative for pain and visual disturbance.  Respiratory: Negative for cough and shortness of breath.   Cardiovascular: Negative for chest pain and palpitations.    Gastrointestinal: Negative for abdominal pain and vomiting.  Genitourinary: Negative for dysuria and hematuria.  Musculoskeletal: Negative for arthralgias and back pain.  Skin: Negative for color change and rash.  Neurological: Negative for seizures and syncope.  All other systems reviewed and are negative.    Physical Exam Triage Vital Signs ED Triage Vitals  Enc Vitals Group     BP      Pulse      Resp      Temp      Temp src      SpO2      Weight      Height      Head Circumference      Peak Flow      Pain Score      Pain Loc      Pain Edu?      Excl. in [place]?    No data found.  Updated Vital Signs BP 105/73 (BP Location: Right Arm)   Pulse 72   Temp 97.6 F (36.4 C)   Resp 17   LMP 01/09/2011   SpO2 96%   Visual Acuity Right Eye Distance:   Left Eye Distance:   Bilateral Distance:    Right Eye Near:   Left Eye Near:    Bilateral Near:     Physical Exam Vitals and nursing note reviewed.  Constitutional:      General: She is not in acute distress.    Appearance: She is well-developed.  HENT:     Head: Normocephalic and atraumatic.      Right Ear: Tympanic membrane normal.     Left Ear: Tympanic membrane normal.     Nose: Nose normal.     Mouth/Throat:     Mouth: Mucous membranes are moist.      Comments: Tender to palpation. Eyes:     Conjunctiva/sclera: Conjunctivae normal.  Cardiovascular:     Rate and Rhythm: Normal rate and regular rhythm.     Heart sounds: No murmur.  Pulmonary:     Effort: Pulmonary effort is normal. No respiratory distress.     Breath sounds: Normal breath sounds.  Abdominal:     Palpations: Abdomen is soft.     Tenderness: There is no abdominal tenderness. There is no guarding or rebound.  Musculoskeletal:     Cervical back: Neck supple.     Right lower leg: No edema.     Left lower leg: No edema.  Skin:    General: Skin is warm and dry.     Findings: No rash.  Neurological:     Mental Status: She is alert.       UC Treatments / Results  Labs (all labs ordered are listed, but only abnormal results are displayed) Labs Reviewed - No data to display  EKG   Radiology No results found.  Procedures Procedures (  including critical care time)  Medications Ordered in UC Medications - No data to display  Initial Impression / Assessment and Plan / UC Course  I have reviewed the triage vital signs and the nursing notes.  Pertinent labs & imaging results that were available during my care of the patient were reviewed by me and considered in my medical decision making (see chart for details).   Facial swelling, dental pain.  Treating with amoxicillin.  Instructed mother to continue giving the patient Claritin daily.  Instructed her to follow-up with her dentist as soon as possible.  Instructed her to go to the emergency department if she has difficulty swallowing or breathing.  Mother agrees to plan of care.      Final Clinical Impressions(s) / UC Diagnoses   Final diagnoses:  Facial swelling  Pain, dental     Discharge Instructions     Take the amoxicillin as directed.  Continue Claritin daily.    Call your dentist to schedule an appointment as soon as possible.    Go to the Emergency Department if you have difficulty swallowing or breathing.        ED Prescriptions    Medication Sig Dispense Auth. Provider   amoxicillin (AMOXIL) 875 MG tablet Take 1 tablet (875 mg total) by mouth 2 (two) times daily for 7 days. 14 tablet Mickie Bail, NP     PDMP not reviewed this encounter.   Mickie Bail, NP 09/25/19 [phone]

## 2019-09-25 NOTE — Telephone Encounter (Signed)
Pt mother called in said that they were in the emergency room until 11 last night because pt had a reaction to medication that Dr. Darrick Huntsman gave her on wednesday

## 2019-09-25 NOTE — Discharge Instructions (Signed)
Take the amoxicillin as directed.  Continue Claritin daily.    Call your dentist to schedule an appointment as soon as possible.    Go to the Emergency Department if you have difficulty swallowing or breathing.

## 2019-10-29 ENCOUNTER — Other Ambulatory Visit: Payer: Self-pay

## 2019-10-29 ENCOUNTER — Encounter: Payer: Self-pay | Admitting: Internal Medicine

## 2019-10-29 ENCOUNTER — Ambulatory Visit (INDEPENDENT_AMBULATORY_CARE_PROVIDER_SITE_OTHER): Payer: 59 | Admitting: Internal Medicine

## 2019-10-29 DIAGNOSIS — R220 Localized swelling, mass and lump, head: Secondary | ICD-10-CM

## 2019-10-29 DIAGNOSIS — L509 Urticaria, unspecified: Secondary | ICD-10-CM | POA: Diagnosis not present

## 2019-10-29 MED ORDER — TRIAMCINOLONE ACETONIDE 0.1 % EX CREA: 1.0000 "application " | TOPICAL_CREAM | Freq: Two times a day (BID) | CUTANEOUS | 1 refills | Status: DC

## 2019-10-29 NOTE — Progress Notes (Signed)
Subjective:  Patient ID: Lindsey [name], female    DOB: 12-18-1985  Age: 34 y.o. MRN: [id number]  CC: Diagnoses of Urticarial rash and Right facial swelling were pertinent to this visit.  HPI Lindsey [name] presents for follow up  This visit occurred during the SARS-CoV-2 public health emergency.  Safety protocols were in place, including screening questions prior to the visit, additional usage of staff PPE, and extensive cleaning of exam room while observing appropriate contact time as indicated for disinfecting solutions.   Patient's mother Lindsey [name] reports that several weeks ago she developed a prurutic rash on her right buttock that developed into .cellulitis due to patient scratching it incessantly.   Recent history The patient developed facial swelling  On May 20 , after initiating therapy for RLS with Requip ,and the facial swelling was accompanied by jaw pain .  The ER doctor on May 20 presumed that this was a drug reaction  And recommended Benadryul and cessation of medication.  Patient's allergy to benadryl was heeded by mother who gave her claritin instead.  she returned to ER on May 21 with continued swelling accompanied by lower jaw pain on same side and was prescribed amoxicillin for presumed tooth abscess and recommended urgent appt with dentist.  The symptoms of facial pain have resolved, but  the rash on her extremities s persisted despite stopping the  requip.   Outpatient Medications Prior to Visit  Medication Sig Dispense Refill   diazepam (DIASTAT ACUDIAL) 10 MG GEL Place 20 mg rectally once.     diazepam (VALIUM) 10 MG tablet Take 1 tablet (10 mg total) by mouth every 12 (twelve) hours as needed for anxiety. 30 tablet 5   LAMICTAL 100 MG tablet Take 100 mg by mouth 2 (two) times daily.      levETIRAcetam (KEPPRA) 750 MG tablet Take 750 mg by mouth 2 (two) times daily.     melatonin 5 MG TABS Take 5 mg by mouth.     PARoxetine (PAXIL) 10 MG tablet Take 1 tablet  (10 mg total) by mouth daily. 90 tablet 0   rOPINIRole (REQUIP) 0.25 MG tablet Take 1 tablet (0.25 mg total) by mouth 3 (three) times daily. 90 tablet 0   No facility-administered medications prior to visit.    Review of Systems;  Patient denies headache, fevers, malaise, unintentional weight loss, skin rash, eye pain, sinus congestion and sinus pain, sore throat, dysphagia,  hemoptysis , cough, dyspnea, wheezing, chest pain, palpitations, orthopnea, edema, abdominal pain, nausea, melena, diarrhea, constipation, flank pain, dysuria, hematuria, urinary  Frequency, nocturia, numbness, tingling, seizures,  Focal weakness, Loss of consciousness,  Tremor, insomnia, depression, anxiety, and suicidal ideation.      Objective:  BP 100/62 (BP Location: Left Arm, Patient Position: Sitting, Cuff Size: Normal)    Pulse 71    Temp 98.5 F (36.9 C) (Temporal)    Resp 16    Ht 5\' 6"  (1.676 m)    Wt 115 lb (52.2 kg)    LMP 01/09/2011    SpO2 97%    BMI 18.56 kg/m   BP Readings from Last 3 Encounters:  10/29/19 100/62  09/25/19 105/73  09/24/19 111/78    Wt Readings from Last 3 Encounters:  10/29/19 115 lb (52.2 kg)  09/24/19 114 lb (51.7 kg)  09/23/19 114 lb 3.2 oz (51.8 kg)    General appearance: alert, cooperative and appears stated age Ears: normal TM's and external ear canals both ears Throat: lips, mucosa, and  tongue normal; teeth and gums normal Neck: no adenopathy, no carotid bruit, supple, symmetrical, trachea midline and thyroid not enlarged, symmetric, no tenderness/mass/nodules Back: symmetric, no curvature. ROM normal. No CVA tenderness. Lungs: clear to auscultation bilaterally Heart: regular rate and rhythm, S1, S2 normal, no murmur, click, rub or gallop Abdomen: soft, non-tender; bowel sounds normal; no masses,  no organomegaly Pulses: 2+ and symmetric Skin: xerotic,  Excoriations on right buttock with hyperemia suggestive of continued scratching without cellulitis  Lymph nodes:  Cervical, supraclavicular, and axillary nodes normal.  No results found for: HGBA1C  Lab Results  Component Value Date   CREATININE 0.68 08/24/2019   CREATININE 0.58 02/12/2019   CREATININE 0.57 03/10/2018    Lab Results  Component Value Date   WBC 4.6 08/24/2019   HGB 13.9 08/24/2019   HCT 40.3 08/24/2019   PLT 168.0 08/24/2019   GLUCOSE 93 08/24/2019   CHOL 113 02/12/2019   TRIG 59.0 02/12/2019   HDL 36.60 (L) 02/12/2019   LDLCALC 65 02/12/2019   ALT 7 08/24/2019   AST 13 08/24/2019   NA 135 08/24/2019   K 3.8 08/24/2019   CL 102 08/24/2019   CREATININE 0.68 08/24/2019   BUN 10 08/24/2019   CO2 27 08/24/2019   TSH 1.84 08/24/2019    No results found.  Assessment & Plan:   Problem List Items Addressed This Visit      Unprioritized   Urticarial rash    Continue management of extreme xerosis and add claritin up to 4 times daily for itching  .  Triamcinolone refilled       Right facial swelling    Recent episode appears to have been due to dental abscess .  Patient has poor dentition          I have discontinued [name]'s rOPINIRole. I am also having her start on triamcinolone cream. Additionally, I am having her maintain her diazepam, LaMICtal, levETIRAcetam, diazepam, PARoxetine, and melatonin.  Meds ordered this encounter  Medications   triamcinolone cream (KENALOG) 0.1 %    Sig: Apply 1 application topically 2 (two) times daily.    Dispense:  80 g    Refill:  1    Medications Discontinued During This Encounter  Medication Reason   rOPINIRole (REQUIP) 0.25 MG tablet Allergic reaction    Follow-up: No follow-ups on file.   Crecencio Mc, MD

## 2019-10-29 NOTE — Patient Instructions (Signed)
INCREASE Sun'S CLARITIN TO 4 TIMES DAILY   I HAVE REFILLED THE TRIAMCINOLONE.  IT IS MUCH STRONGER THAN HYDRCORTISONE

## 2019-10-30 ENCOUNTER — Telehealth: Payer: Self-pay | Admitting: Internal Medicine

## 2019-10-30 DIAGNOSIS — L509 Urticaria, unspecified: Secondary | ICD-10-CM | POA: Insufficient documentation

## 2019-10-30 DIAGNOSIS — R220 Localized swelling, mass and lump, head: Secondary | ICD-10-CM | POA: Insufficient documentation

## 2019-10-30 NOTE — Assessment & Plan Note (Addendum)
Continue management of extreme xerosis and add claritin up to 4 times daily for itching  .  Triamcinolone refilled

## 2019-10-30 NOTE — Telephone Encounter (Signed)
My Chart message sent

## 2019-10-30 NOTE — Assessment & Plan Note (Signed)
Recent episode appears to have been due to dental abscess .  Patient has poor dentition

## 2019-11-02 ENCOUNTER — Ambulatory Visit
Admission: EM | Admit: 2019-11-02 | Discharge: 2019-11-02 | Disposition: A | Discharge status: Home / Self Care | Payer: 59 | Attending: Emergency Medicine | Admitting: Emergency Medicine

## 2019-11-02 DIAGNOSIS — R220 Localized swelling, mass and lump, head: Secondary | ICD-10-CM | POA: Diagnosis not present

## 2019-11-02 MED ORDER — AMOXICILLIN 875 MG PO TABS: 875.0000 mg | ORAL_TABLET | Freq: Two times a day (BID) | ORAL | 0 refills | Status: AC

## 2019-11-02 NOTE — Discharge Instructions (Addendum)
Give your daughter the antibiotic as prescribed.    Please call to make an appointment with a dentist as soon as possible.    Return here or go to the emergency department if your daughter develops increased pain, swelling, fever, chills, or other concerning symptoms.

## 2019-11-02 NOTE — ED Provider Notes (Signed)
[name]    CSN: [id number] Arrival date & time: 11/02/19  2423      History   Chief Complaint Chief Complaint  Patient presents with  . Facial Swelling    HPI Lindsey [name] is a 34 y.o. female.   Accompanied by her mother, patient presents with Right facial swelling in her right lower jaw area since this morning.  Her mother reports the area is tender; No facial swelling last night.  Patient was seen in the ED on 09/24/2019 for Left side facial swelling which was thought to be due to to a reaction to Requip; she was advised to stop the Requip and treated with Benadryl.  Patient was seen here in the urgent care the next day 09/25/2019 because she had developed pain in the area; she was treated with amoxicillin and instructed to follow-up with her dentist; The swelling resolved with the medication.  Mother denies fever, rash, change in appetite or activity, cough, vomiting, or other symptoms.    The history is provided by the patient and a parent.    Past Medical History:  Diagnosis Date  . Epilepsy [facility]) age 33   negative metabolic workup has brain abnormalities by MRI (Dr. Quintin Alto, [facility])  . Insomnia   . Status post VNS (vagus nerve stimulator) placement 2000  . Temporal lobectomy behavior syndrome 1994   Right    Patient Active Problem List   Diagnosis Date Noted  . Urticarial rash 10/30/2019  . Right facial swelling 10/30/2019  . Right hip pain 06/11/2018  . Poor dentition 09/02/2017  . Cough 01/08/2017  . Constipation 11/10/2015  . Long-term use of high-risk medication 10/11/2015  . Xerosis of skin 12/12/2014  . Restless legs syndrome 07/12/2014  . Restlessness and agitation 09/10/2012  . Routine general medical examination at a health care facility 06/08/2012  . Cerebral palsy (HCC) 05/08/2012  . Acneiform dermatitis 06/30/2011  . Partial epilepsy with intractable epilepsy (HCC) 03/23/2011    Past Surgical History:  Procedure Laterality Date  .  ABDOMINAL HYSTERECTOMY  2005  . BRAIN SURGERY     Temporal Lobe resection  . CRANIOTOMY FOR TEMPORAL LOBECTOMY    . TONSILLECTOMY AND ADENOIDECTOMY  age 75    OB History   No obstetric history on file.      Home Medications    Prior to Admission medications   Medication Sig Start Date End Date Taking? Authorizing Provider  amoxicillin (AMOXIL) 875 MG tablet Take 1 tablet (875 mg total) by mouth 2 (two) times daily for 7 days. 11/02/19 11/09/19  Mickie Bail, NP  diazepam (DIASTAT ACUDIAL) 10 MG GEL Place 20 mg rectally once.    [provider]  diazepam (VALIUM) 10 MG tablet Take 1 tablet (10 mg total) by mouth every 12 (twelve) hours as needed for anxiety. 08/24/19   Sherlene Shams, MD  LAMICTAL 100 MG tablet Take 100 mg by mouth 2 (two) times daily.  08/03/16   [provider]  levETIRAcetam (KEPPRA) 750 MG tablet Take 750 mg by mouth 2 (two) times daily.    [provider]  melatonin 5 MG TABS Take 5 mg by mouth.    [provider]  PARoxetine (PAXIL) 10 MG tablet Take 1 tablet (10 mg total) by mouth daily. 08/24/19   Sherlene Shams, MD  triamcinolone cream (KENALOG) 0.1 % Apply 1 application topically 2 (two) times daily. 10/29/19   Sherlene Shams, MD    Family History History  reviewed. No pertinent family history.  Social History Social History   Tobacco Use  . Smoking status: Never Smoker  . Smokeless tobacco: Never Used  Vaping Use  . Vaping Use: Never used  Substance Use Topics  . Alcohol use: No    Alcohol/week: 0.0 standard drinks  . Drug use: No     Allergies   Influenza vac split quad, Benadryl [diphenhydramine hcl], Requip [ropinirole], and Zyrtec [cetirizine hcl]   Review of Systems Review of Systems  Constitutional: Negative for chills and fever.  HENT: Positive for facial swelling. Negative for ear pain, sore throat and trouble swallowing.   Eyes: Negative for pain and visual disturbance.  Respiratory: Negative for  cough and shortness of breath.   Cardiovascular: Negative for chest pain and palpitations.  Gastrointestinal: Negative for abdominal pain and vomiting.  Genitourinary: Negative for dysuria and hematuria.  Musculoskeletal: Negative for arthralgias and back pain.  Skin: Negative for color change and rash.  Neurological: Negative for seizures and syncope.  All other systems reviewed and are negative.    Physical Exam Triage Vital Signs ED Triage Vitals  Enc Vitals Group     BP      Pulse      Resp      Temp      Temp src      SpO2      Weight      Height      Head Circumference      Peak Flow      Pain Score      Pain Loc      Pain Edu?      Excl. in GC?    No data found.  Updated Vital Signs BP 111/75   Pulse 73   Temp 97.6 F (36.4 C)   Resp 16   LMP 01/09/2011   SpO2 98%   Visual Acuity Right Eye Distance:   Left Eye Distance:   Bilateral Distance:    Right Eye Near:   Left Eye Near:    Bilateral Near:     Physical Exam Vitals and nursing note reviewed.  Constitutional:      General: She is not in acute distress.    Appearance: She is well-developed.  HENT:     Head: Normocephalic and atraumatic.      Right Ear: Tympanic membrane normal.     Left Ear: Tympanic membrane normal.     Nose: Nose normal.     Mouth/Throat:     Mouth: Mucous membranes are moist.     Dentition: Dental caries present.     Pharynx: Oropharynx is clear. Uvula midline. No uvula swelling.  Eyes:     Conjunctiva/sclera: Conjunctivae normal.  Cardiovascular:     Rate and Rhythm: Normal rate and regular rhythm.     Heart sounds: No murmur heard.   Pulmonary:     Effort: Pulmonary effort is normal. No respiratory distress.     Breath sounds: Normal breath sounds.  Abdominal:     Palpations: Abdomen is soft.     Tenderness: There is no abdominal tenderness. There is no guarding or rebound.  Musculoskeletal:     Cervical back: Neck supple.  Skin:    General: Skin is warm  and dry.     Findings: No rash.  Neurological:     Mental Status: She is alert. Mental status is at baseline.      UC Treatments / Results  Labs (all labs ordered are listed, but  only abnormal results are displayed) Labs Reviewed - No data to display  EKG   Radiology No results found.  Procedures Procedures (including critical care time)  Medications Ordered in UC Medications - No data to display  Initial Impression / Assessment and Plan / UC Course  I have reviewed the triage vital signs and the nursing notes.  Pertinent labs & imaging results that were available during my care of the patient were reviewed by me and considered in my medical decision making (see chart for details).   Facial swelling.  Instructed mother to schedule appointment with dentist as soon as possible; dental resource guide provided.  7-day course of amoxicillin.  Return precautions discussed.  Mother agrees to plan of care.       Final Clinical Impressions(s) / UC Diagnoses   Final diagnoses:  Facial swelling     Discharge Instructions     Give your daughter the antibiotic as prescribed.    Please call to make an appointment with a dentist as soon as possible.    Return here or go to the emergency department if your daughter develops increased pain, swelling, fever, chills, or other concerning symptoms.        ED Prescriptions    Medication Sig Dispense Auth. Provider   amoxicillin (AMOXIL) 875 MG tablet Take 1 tablet (875 mg total) by mouth 2 (two) times daily for 7 days. 14 tablet Mickie Bail, NP     PDMP not reviewed this encounter.   Mickie Bail, NP 11/02/19 [phone]

## 2019-11-02 NOTE — ED Triage Notes (Signed)
Patient's mother reports patient woke up this morning with facial swelling. Reports it was not there when they went to sleep last night.

## 2019-11-03 ENCOUNTER — Other Ambulatory Visit: Payer: Self-pay

## 2019-11-03 ENCOUNTER — Encounter: Payer: Self-pay | Admitting: Internal Medicine

## 2019-11-03 ENCOUNTER — Ambulatory Visit (INDEPENDENT_AMBULATORY_CARE_PROVIDER_SITE_OTHER): Payer: 59 | Admitting: Internal Medicine

## 2019-11-03 VITALS — BP 98/72 | HR 57 | Temp 97.9°F | Ht 65.98 in | Wt 115.8 lb

## 2019-11-03 DIAGNOSIS — H02843 Edema of right eye, unspecified eyelid: Secondary | ICD-10-CM

## 2019-11-03 DIAGNOSIS — L509 Urticaria, unspecified: Secondary | ICD-10-CM

## 2019-11-03 MED ORDER — PREDNISONE 10 MG PO TABS: ORAL_TABLET | ORAL | 0 refills | Status: DC

## 2019-11-03 NOTE — Patient Instructions (Addendum)
Consider allergy and immunology referral as well for food allergy testing and more extensive testing  Continue claritin   Start prednisone taper  cerave cream to body  Cool washcloth to eyes for swelling      Hives Hives (urticaria) are itchy, red, swollen areas on the skin. Hives can appear on any part of the body. Hives often fade within 24 hours (acute hives). Sometimes, new hives appear after old ones fade and the cycle can continue for several days or weeks (chronic hives). Hives do not spread from person to person (are not contagious). Hives come from the body's reaction to something a person is allergic to (allergen), something that causes irritation, or various other triggers. When a person is exposed to a trigger, his or her body releases a chemical (histamine) that causes redness, itching, and swelling. Hives can appear right after exposure to a trigger or hours later. What are the causes? This condition may be caused by:  Allergies to foods or ingredients.  Insect bites or stings.  Exposure to pollen or pets.  Contact with latex or chemicals.  Spending time in sunlight, heat, or cold (exposure).  Exercise.  Stress.  Certain medicines. You can also get hives from other medical conditions and treatments, such as:  Viruses, including the common cold.  Bacterial infections, such as urinary tract infections and strep throat.  Certain medicines.  Allergy shots.  Blood transfusions. Sometimes, the cause of this condition is not known (idiopathic hives). What increases the risk? You are more likely to develop this condition if you:  Are a woman.  Have food allergies, especially to citrus fruits, milk, eggs, peanuts, tree nuts, or shellfish.  Are allergic to: ? Medicines. ? Latex. ? Insects. ? Animals. ? Pollen. What are the signs or symptoms? Common symptoms of this condition include raised, itchy, red or white bumps or patches on your skin. These areas  may:  Become large and swollen (welts).  Change in shape and location, quickly and repeatedly.  Be separate hives or connect over a large area of skin.  Sting or become painful.  Turn white when pressed in the center (blanch). In severe cases, yourhands, feet, and face may also become swollen. This may occur if hives develop deeper in your skin. How is this diagnosed? This condition may be diagnosed by your symptoms, medical history, and physical exam.  Your skin, urine, or blood may be tested to find out what is causing your hives and to rule out other health issues.  Your health care provider may also remove a small sample of skin from the affected area and examine it under a microscope (biopsy). How is this treated? Treatment for this condition depends on the cause and severity of your symptoms. Your health care provider may recommend using cool, wet cloths (cool compresses) or taking cool showers to relieve itching. Treatment may include:  Medicines that help: ? Relieve itching (antihistamines). ? Reduce swelling (corticosteroids). ? Treat infection (antibiotics).  An injectable medicine (omalizumab). Your health care provider may prescribe this if you have chronic idiopathic hives and you continue to have symptoms even after treatment with antihistamines. Severe cases may require an emergency injection of adrenaline (epinephrine) to prevent a life-threatening allergic reaction (anaphylaxis). Follow these instructions at home: Medicines  Take and apply over-the-counter and prescription medicines only as told by your health care provider.  If you were prescribed an antibiotic medicine, take it as told by your health care provider. Do not stop using the  antibiotic even if you start to feel better. Skin care  Apply cool compresses to the affected areas.  Do not scratch or rub your skin. General instructions  Do not take hot showers or baths. This can make itching  worse.  Do not wear tight-fitting clothing.  Use sunscreen and wear protective clothing when you are outside.  Avoid any substances that cause your hives. Keep a journal to help track what causes your hives. Write down: ? What medicines you take. ? What you eat and drink. ? What products you use on your skin.  Keep all follow-up visits as told by your health care provider. This is important. Contact a health care provider if:  Your symptoms are not controlled with medicine.  Your joints are painful or swollen. Get help right away if:  You have a fever.  You have pain in your abdomen.  Your tongue or lips are swollen.  Your eyelids are swollen.  Your chest or throat feels tight.  You have trouble breathing or swallowing. These symptoms may represent a serious problem that is an emergency. Do not wait to see if the symptoms will go away. Get medical help right away. Call your local emergency services (911 in the [place] Do not drive yourself to the hospital. Summary  Hives (urticaria) are itchy, red, swollen areas on your skin. Hives come from the body's reaction to something a person is allergic to (allergen), something that causes irritation, or various other triggers.  Treatment for this condition depends on the cause and severity of your symptoms.  Avoid any substances that cause your hives. Keep a journal to help track what causes your hives.  Take and apply over-the-counter and prescription medicines only as told by your health care provider.  Keep all follow-up visits as told by your health care provider. This is important. This information is not intended to replace advice given to you by your health care provider. Make sure you discuss any questions you have with your health care provider. Document Revised: 11/06/2017 Document Reviewed: 11/06/2017 Elsevier Patient Education  2020 [employer].

## 2019-11-03 NOTE — Progress Notes (Signed)
Chief Complaint  Patient presents with  . Facial Swelling    pt c/o right side facial swelling and hives along legs. Accompanied by mother.   F/u with mom who provides history  1. C/o facial swelling right side and red hives right side of body w/o itching I.e right arm and right hip prior hives were on left side of body and mom not sure trigger. meds are the same except paxil new and had hives before, no insect bites, no known h/o allergies, no UTI sxs and culture 08/2019 negative UTI  Mom wonders if something going on with immune system  Mom has tried claritin 1-2 x per day    Review of Systems  Constitutional: Negative for weight loss.  HENT: Negative for hearing loss.   Eyes: Negative for blurred vision.  Respiratory: Negative for shortness of breath.   Cardiovascular: Negative for chest pain.  Genitourinary: Negative for dysuria.  Skin: Positive for rash. Negative for itching.   Past Medical History:  Diagnosis Date  . Epilepsy [facility]) age 34   negative metabolic workup has brain abnormalities by MRI (Dr. Quintin Alto, [facility])  . Insomnia   . Status post VNS (vagus nerve stimulator) placement 2000  . Temporal lobectomy behavior syndrome 1994   Right   Past Surgical History:  Procedure Laterality Date  . ABDOMINAL HYSTERECTOMY  2005  . BRAIN SURGERY     Temporal Lobe resection  . CRANIOTOMY FOR TEMPORAL LOBECTOMY    . TONSILLECTOMY AND ADENOIDECTOMY  age 34   No family history on file. Social History   Socioeconomic History  . Marital status: Single    Spouse name: Not on file  . Number of children: Not on file  . Years of education: Not on file  . Highest education level: Not on file  Occupational History  . Not on file  Tobacco Use  . Smoking status: Never Smoker  . Smokeless tobacco: Never Used  Vaping Use  . Vaping Use: Never used  Substance and Sexual Activity  . Alcohol use: No    Alcohol/week: 0.0 standard drinks  . Drug use: No  . Sexual activity: Not on file    Other Topics Concern  . Not on file  Social History Narrative   Lives with mother   Social Determinants of Health   Financial Resource Strain:   . Difficulty of Paying Living Expenses:   Food Insecurity:   . Worried About Programme researcher, broadcasting/film/video in the Last Year:   . Barista in the Last Year:   Transportation Needs:   . Freight forwarder (Medical):   Marland Kitchen Lack of Transportation (Non-Medical):   Physical Activity:   . Days of Exercise per Week:   . Minutes of Exercise per Session:   Stress:   . Feeling of Stress :   Social Connections:   . Frequency of Communication with Friends and Family:   . Frequency of Social Gatherings with Friends and Family:   . Attends Religious Services:   . Active Member of Clubs or Organizations:   . Attends Banker Meetings:   Marland Kitchen Marital Status:   Intimate Partner Violence:   . Fear of Current or Ex-Partner:   . Emotionally Abused:   Marland Kitchen Physically Abused:   . Sexually Abused:    Current Meds  Medication Sig  . amoxicillin (AMOXIL) 875 MG tablet Take 1 tablet (875 mg total) by mouth 2 (two) times daily for 7 days.  . diazepam (DIASTAT  ACUDIAL) 10 MG GEL Place 20 mg rectally once.  . diazepam (VALIUM) 10 MG tablet Take 1 tablet (10 mg total) by mouth every 12 (twelve) hours as needed for anxiety.  Marland Kitchen LAMICTAL 100 MG tablet Take 100 mg by mouth 2 (two) times daily.   Marland Kitchen levETIRAcetam (KEPPRA) 750 MG tablet Take 750 mg by mouth 2 (two) times daily.  . melatonin 5 MG TABS Take 5 mg by mouth.  Marland Kitchen PARoxetine (PAXIL) 10 MG tablet Take 1 tablet (10 mg total) by mouth daily.  Marland Kitchen triamcinolone cream (KENALOG) 0.1 % Apply 1 application topically 2 (two) times daily.   Allergies  Allergen Reactions  . Influenza Vac Split Quad Other (See Comments)    48 hours of fever, myalgias   . Benadryl [Diphenhydramine Hcl]     Seizure  . Requip [Ropinirole]     Itching,   . Zyrtec [Cetirizine Hcl]     Seizure    Recent Results (from the past  2160 hour(s))  TSH     Status: None   Collection Time: 08/24/19  4:29 PM  Result Value Ref Range   TSH 1.84 0.35 - 4.50 uIU/mL  Follicle stimulating hormone     Status: None   Collection Time: 08/24/19  4:29 PM  Result Value Ref Range   FSH 5.9 mIU/ML    Comment: Female Reference Range:  1.4-18.1 mIU/mLFemale Reference Range:Follicular Phase          2.5-10.2 mIU/mLMidCycle Peak          3.4-33.4 mIU/mLLuteal Phase          1.5-9.1 mIU/mLPost Menopausal     23.0-116.3 mIU/mLPregnant          <0.3 mIU/mL  LH     Status: None   Collection Time: 08/24/19  4:29 PM  Result Value Ref Range   LH 4.10 mIU/mL    Comment: Female Reference Range:20-70 yrs     1.5-9.3 mIU/mL>70 yrs       3.1-35.6 mIU/mLFemale Reference Range:Follicular Phase     1.9-12.5 mIU/mLMidcycle             8.7-76.3 mIU/mLLuteal Phase         0.5-16.9 mIU/mL  Post Menopausal      15.9-54.0  mIU/mLPregnant             <1.5 mIU/mLContraceptives       0.7-5.6 mIU/mL   Comprehensive metabolic panel     Status: None   Collection Time: 08/24/19  4:29 PM  Result Value Ref Range   Sodium 135 135 - 145 mEq/L   Potassium 3.8 3.5 - 5.1 mEq/L   Chloride 102 96 - 112 mEq/L   CO2 27 19 - 32 mEq/L   Glucose, Bld 93 70 - 99 mg/dL   BUN 10 6 - 23 mg/dL   Creatinine, Ser 1.50 0.40 - 1.20 mg/dL   Total Bilirubin 0.3 0.2 - 1.2 mg/dL   Alkaline Phosphatase 53 39 - 117 U/L   AST 13 0 - 37 U/L   ALT 7 0 - 35 U/L   Total Protein 7.1 6.0 - 8.3 g/dL   Albumin 4.4 3.5 - 5.2 g/dL   GFR 569.79 >48.01 mL/min   Calcium 9.5 8.4 - 10.5 mg/dL  CBC with Differential/Platelet     Status: None   Collection Time: 08/24/19  4:29 PM  Result Value Ref Range   WBC 4.6 4.0 - 10.5 K/uL   RBC 4.77 3.87 - 5.11 Mil/uL   Hemoglobin 13.9  12.0 - 15.0 g/dL   HCT 16.140.3 36 - 46 %   MCV 84.6 78.0 - 100.0 fl   MCHC 34.4 30.0 - 36.0 g/dL   RDW 09.612.2 04.511.5 - 40.915.5 %   Platelets 168.0 150 - 400 K/uL   Neutrophils Relative % 51.7 43 - 77 %   Lymphocytes Relative 37.8 12 -  46 %   Monocytes Relative 9.5 3 - 12 %   Eosinophils Relative 0.0 0 - 5 %   Basophils Relative 1.0 0 - 3 %   Neutro Abs 2.4 1.4 - 7.7 K/uL   Lymphs Abs 1.7 0.7 - 4.0 K/uL   Monocytes Absolute 0.4 0 - 1 K/uL   Eosinophils Absolute 0.0 0 - 0 K/uL   Basophils Absolute 0.0 0 - 0 K/uL  Urinalysis, Routine w reflex microscopic     Status: Abnormal   Collection Time: 08/24/19  4:29 PM  Result Value Ref Range   Color, Urine YELLOW Yellow;Lt. Yellow;Straw;Dark Yellow;Amber;Green;Red;Brown   APPearance Sl Cloudy (A) Clear;Turbid;Slightly Cloudy;Cloudy   Specific Gravity, Urine 1.020 1.000 - 1.030   pH 7.5 5.0 - 8.0   Total Protein, Urine NEGATIVE Negative   Urine Glucose NEGATIVE Negative   Ketones, ur NEGATIVE Negative   Bilirubin Urine NEGATIVE Negative   Hgb urine dipstick MODERATE (A) Negative   Urobilinogen, UA 0.2 0.0 - 1.0   Leukocytes,Ua NEGATIVE Negative   Nitrite NEGATIVE Negative   WBC, UA 0-2/hpf 0-2/hpf   RBC / HPF 7-10/hpf (A) 0-2/hpf   Mucus, UA Presence of (A) None   Squamous Epithelial / LPF Few(5-10/hpf) (A) Rare(0-4/hpf)   Bacteria, UA Rare(<10/hpf) (A) None   Amorphous Present (A) None;Present  Urine Culture     Status: None   Collection Time: 08/24/19  4:29 PM   Specimen: Blood  Result Value Ref Range   MICRO NUMBER: [id number]    SPECIMEN QUALITY: Adequate    Sample Source URINE    STATUS: FINAL    Result: No Growth    Objective  Body mass index is 18.7 kg/m. Wt Readings from Last 3 Encounters:  11/03/19 115 lb 12.8 oz (52.5 kg)  10/29/19 115 lb (52.2 kg)  09/24/19 114 lb (51.7 kg)   Temp Readings from Last 3 Encounters:  11/03/19 97.9 F (36.6 C) (Axillary)  11/02/19 97.6 F (36.4 C)  10/29/19 98.5 F (36.9 C) (Temporal)   BP Readings from Last 3 Encounters:  11/03/19 98/72  11/02/19 111/75  10/29/19 100/62   Pulse Readings from Last 3 Encounters:  11/03/19 (!) 57  11/02/19 73  10/29/19 71    Physical Exam Vitals and nursing note  reviewed.  Constitutional:      Appearance: Normal appearance. She is well-developed and well-groomed.  HENT:     Head: Normocephalic and atraumatic.  Eyes:     Conjunctiva/sclera: Conjunctivae normal.     Pupils: Pupils are equal, round, and reactive to light.  Cardiovascular:     Rate and Rhythm: Normal rate and regular rhythm.     Heart sounds: Normal heart sounds.  Pulmonary:     Effort: Pulmonary effort is normal.     Breath sounds: Normal breath sounds.  Abdominal:     General: Abdomen is flat. Bowel sounds are normal.     Tenderness: There is no abdominal tenderness.  Skin:    General: Skin is warm and dry.          Comments: +hives   Neurological:     General: No focal deficit  present.     Mental Status: She is alert and oriented to person, place, and time. Mental status is at baseline.     Gait: Gait normal.  Psychiatric:        Attention and Perception: Attention and perception normal.        Mood and Affect: Mood and affect normal.        Speech: Speech normal.        Behavior: Behavior normal. Behavior is cooperative.        Thought Content: Thought content normal.        Cognition and Memory: Cognition and memory normal.        Judgment: Judgment normal.     Assessment  Plan  Hives ? allergic Swelling of gland of right eyelid  -Rx prednisone taper  -prn Claritin  Upcoming appt with ENT Dr. Jenne Campus and dental  Cont Abx Disc with mom cool compress for eyelids  And consider allergy& immunology in the future for food allergy testing   Provider: Dr. French Ana McLean-Scocuzza-Internal Medicine

## 2019-11-04 ENCOUNTER — Other Ambulatory Visit: Payer: Self-pay | Admitting: Internal Medicine

## 2019-12-03 ENCOUNTER — Ambulatory Visit: Payer: 59 | Admitting: Internal Medicine

## 2019-12-03 ENCOUNTER — Other Ambulatory Visit: Payer: Self-pay

## 2019-12-07 ENCOUNTER — Telehealth: Payer: Self-pay | Admitting: Internal Medicine

## 2019-12-07 NOTE — Telephone Encounter (Signed)
Mother called and cancelled appointment for 8/3 and moved it to 9/2. She states Lindsey [name] is sleeping better and is doing well and wants to know if they need to keep the appointment on 9/2?

## 2019-12-07 NOTE — Telephone Encounter (Signed)
Spoke with pt's mother and she stated that pt is doing good and went ahead and canceled pt's appt.

## 2019-12-07 NOTE — Telephone Encounter (Signed)
Does not need to keep appt if better

## 2019-12-08 ENCOUNTER — Other Ambulatory Visit: Payer: Self-pay

## 2019-12-08 ENCOUNTER — Ambulatory Visit: Payer: 59 | Admitting: Internal Medicine

## 2019-12-08 ENCOUNTER — Ambulatory Visit
Admission: EM | Admit: 2019-12-08 | Discharge: 2019-12-08 | Disposition: A | Discharge status: Home / Self Care | Payer: 59

## 2019-12-08 DIAGNOSIS — J069 Acute upper respiratory infection, unspecified: Secondary | ICD-10-CM

## 2019-12-08 NOTE — ED Provider Notes (Signed)
Lindsey [name]    CSN: [id number] Arrival date & time: 12/08/19  5809      History   Chief Complaint Chief Complaint  Patient presents with  . Sore Throat  . Nasal Congestion    HPI Lindsey [name] is a 34 y.o. female.   Accompanied by her mother, patient presents with runny nose, nonproductive cough, sneezing, sore throat since yesterday.  She denies fever, shortness of breath, abdominal pain, vomiting, diarrhea, rash, or other symptoms.  Treatment attempted with Claritin, Delsym, ibuprofen.  Mother declines COVID or strep testing today.  The history is provided by the patient and a parent.    Past Medical History:  Diagnosis Date  . Epilepsy [facility] age 78   negative metabolic workup has brain abnormalities by MRI (Dr. Quintin Alto, [facility])  . Insomnia   . Status post VNS (vagus nerve stimulator) placement 2000  . Temporal lobectomy behavior syndrome 1994   Right    Patient Active Problem List   Diagnosis Date Noted  . Urticarial rash 10/30/2019  . Right facial swelling 10/30/2019  . Right hip pain 06/11/2018  . Poor dentition 09/02/2017  . Cough 01/08/2017  . Constipation 11/10/2015  . Long-term use of high-risk medication 10/11/2015  . Xerosis of skin 12/12/2014  . Restless legs syndrome 07/12/2014  . Restlessness and agitation 09/10/2012  . Routine general medical examination at a health care facility 06/08/2012  . Cerebral palsy (HCC) 05/08/2012  . Acneiform dermatitis 06/30/2011  . Partial epilepsy with intractable epilepsy (HCC) 03/23/2011    Past Surgical History:  Procedure Laterality Date  . ABDOMINAL HYSTERECTOMY  2005  . BRAIN SURGERY     Temporal Lobe resection  . CRANIOTOMY FOR TEMPORAL LOBECTOMY    . TONSILLECTOMY AND ADENOIDECTOMY  age 63    OB History   No obstetric history on file.      Home Medications    Prior to Admission medications   Medication Sig Start Date End Date Taking? Authorizing Provider  diazepam (DIASTAT ACUDIAL) 10  MG GEL Place 20 mg rectally once.    [provider]  diazepam (VALIUM) 10 MG tablet Take 1 tablet (10 mg total) by mouth every 12 (twelve) hours as needed for anxiety. 08/24/19   Sherlene Shams, MD  LAMICTAL 100 MG tablet Take 100 mg by mouth 2 (two) times daily.  08/03/16   [provider]  levETIRAcetam (KEPPRA) 750 MG tablet Take 750 mg by mouth 2 (two) times daily.    [provider]  melatonin 5 MG TABS Take 5 mg by mouth.    [provider]  PARoxetine (PAXIL) 10 MG tablet TAKE 1 TABLET BY MOUTH DAILY 11/04/19   Sherlene Shams, MD  predniSONE (DELTASONE) 10 MG tablet 6 tablets on Day 1 in am with food , then reduce by 1 tablet daily until gone 11/03/19   McLean-Scocuzza, Pasty Spillers, MD  triamcinolone cream (KENALOG) 0.1 % Apply 1 application topically 2 (two) times daily. 10/29/19   Sherlene Shams, MD    Family History History reviewed. No pertinent family history.  Social History Social History   Tobacco Use  . Smoking status: Never Smoker  . Smokeless tobacco: Never Used  Vaping Use  . Vaping Use: Never used  Substance Use Topics  . Alcohol use: No    Alcohol/week: 0.0 standard drinks  . Drug use: No     Allergies   Influenza vac split quad, Benadryl [diphenhydramine hcl], Requip [ropinirole], and Zyrtec [cetirizine  hcl]   Review of Systems Review of Systems  Constitutional: Negative for chills and fever.  HENT: Positive for rhinorrhea, sneezing and sore throat. Negative for congestion and ear pain.   Eyes: Negative for pain and visual disturbance.  Respiratory: Positive for cough. Negative for shortness of breath.   Cardiovascular: Negative for chest pain and palpitations.  Gastrointestinal: Negative for abdominal pain, diarrhea, nausea and vomiting.  Genitourinary: Negative for dysuria and hematuria.  Musculoskeletal: Negative for arthralgias and back pain.  Skin: Negative for color change and rash.  Neurological: Negative for  seizures and syncope.  All other systems reviewed and are negative.    Physical Exam Triage Vital Signs ED Triage Vitals  Enc Vitals Group     BP      Pulse      Resp      Temp      Temp src      SpO2      Weight      Height      Head Circumference      Peak Flow      Pain Score      Pain Loc      Pain Edu?      Excl. in GC?    No data found.  Updated Vital Signs BP 111/79 (BP Location: Right Arm)   Pulse 82   Temp 98.1 F (36.7 C) (Axillary)   Resp 16   LMP 01/09/2011   SpO2 100%   Visual Acuity Right Eye Distance:   Left Eye Distance:   Bilateral Distance:    Right Eye Near:   Left Eye Near:    Bilateral Near:     Physical Exam Vitals and nursing note reviewed.  Constitutional:      General: She is not in acute distress.    Appearance: She is well-developed. She is not ill-appearing.  HENT:     Head: Normocephalic and atraumatic.     Right Ear: Tympanic membrane normal.     Left Ear: Tympanic membrane normal.     Nose: Nose normal.     Mouth/Throat:     Mouth: Mucous membranes are moist.     Pharynx: Oropharynx is clear.  Eyes:     Conjunctiva/sclera: Conjunctivae normal.  Cardiovascular:     Rate and Rhythm: Normal rate and regular rhythm.     Heart sounds: No murmur heard.   Pulmonary:     Effort: Pulmonary effort is normal. No respiratory distress.     Breath sounds: Normal breath sounds. No wheezing or rhonchi.  Abdominal:     Palpations: Abdomen is soft.     Tenderness: There is no abdominal tenderness. There is no guarding or rebound.  Musculoskeletal:     Cervical back: Neck supple.  Skin:    General: Skin is warm and dry.     Findings: No rash.  Neurological:     Mental Status: She is alert. Mental status is at baseline.  Psychiatric:        Mood and Affect: Mood normal.        Behavior: Behavior normal.      UC Treatments / Results  Labs (all labs ordered are listed, but only abnormal results are displayed) Labs Reviewed  - No data to display  EKG   Radiology No results found.  Procedures Procedures (including critical care time)  Medications Ordered in UC Medications - No data to display  Initial Impression / Assessment and Plan / UC Course  I have reviewed the triage vital signs and the nursing notes.  Pertinent labs & imaging results that were available during my care of the patient were reviewed by me and considered in my medical decision making (see chart for details).   Viral upper respiratory infection.  Mother declines COVID test and strep test.  Treating symptomatically with Tylenol and Mucinex as needed.  Instructed mother and patient to follow-up with their PCP if her symptoms are not improving.  Patient and her mother agree to plan of care.   Final Clinical Impressions(s) / UC Diagnoses   Final diagnoses:  Viral URI     Discharge Instructions     Tylenol as needed for fever or discomfort.  Mucinex as needed for congestion.    Follow up with your primary care provider if your symptoms are not improving.       ED Prescriptions    None     PDMP not reviewed this encounter.   Mickie Bail, NP 12/08/19 [phone]

## 2019-12-08 NOTE — Discharge Instructions (Signed)
Tylenol as needed for fever or discomfort.  Mucinex as needed for congestion.    Follow up with your primary care provider if your symptoms are not improving.

## 2019-12-08 NOTE — ED Triage Notes (Addendum)
Pt BIB mother for runny nose, sore throat, cough, sneezing. Pt's mother has been treating with Claritin, delysum, and ibuprofen with out relief. Pt's mother denies fever, n/v/d. Mother refused strep and covid testing at this time. States she had similar symptoms that improved.

## 2019-12-18 ENCOUNTER — Ambulatory Visit
Admission: EM | Admit: 2019-12-18 | Discharge: 2019-12-18 | Disposition: A | Discharge status: Home / Self Care | Payer: 59 | Attending: Emergency Medicine | Admitting: Emergency Medicine

## 2019-12-18 DIAGNOSIS — R220 Localized swelling, mass and lump, head: Secondary | ICD-10-CM

## 2019-12-18 MED ORDER — PREDNISONE 10 MG (21) PO TBPK: ORAL_TABLET | Freq: Every day | ORAL | 0 refills | Status: DC

## 2019-12-18 NOTE — ED Triage Notes (Signed)
Patients mother reports patient woke up this morning with facial swelling. States the patient has followed up with the [facility] dental and an allergist, both of which are unsure what is causing the facial swelling.

## 2019-12-18 NOTE — ED Provider Notes (Signed)
Lindsey [name]    CSN: [id number] Arrival date & time: 12/18/19  0807      History   Chief Complaint Chief Complaint  Patient presents with  . Facial Swelling    HPI Lindsey [name] is a 34 y.o. female.   Accompanied by her mother, patient presents with right side facial swelling since awakening this morning.  No difficulty swallowing or breathing.  Mother states the patient started with hives 2 days ago.  Treated at home with Claritin this morning.  No fever, chills, cough, shortness of breath, abdominal pain, vomiting, diarrhea, rash, or other symptoms.  Patient denies pain.  She was seen here for similar symptoms on 11/02/2019; treated with amoxicillin for dental infection and instructed to follow-up with her dentist.  Mother reports she followed up with the dentist and an allergist; no known cause for the facial swelling identified.  Patient was also seen in the ED on 09/24/2019 for left facial swelling; this was thought to be an allergic reaction to Requip and she was treated with Benadryl.  She was seen here at the urgent care the next day on 09/25/2019 and treated with amoxicillin because she had developed a dental pain at that time.  The history is provided by the patient.    Past Medical History:  Diagnosis Date  . Epilepsy [facility]) age 35   negative metabolic workup has brain abnormalities by MRI (Dr. Quintin Alto, [facility])  . Insomnia   . Status post VNS (vagus nerve stimulator) placement 2000  . Temporal lobectomy behavior syndrome 1994   Right    Patient Active Problem List   Diagnosis Date Noted  . Urticarial rash 10/30/2019  . Right facial swelling 10/30/2019  . Right hip pain 06/11/2018  . Poor dentition 09/02/2017  . Cough 01/08/2017  . Constipation 11/10/2015  . Long-term use of high-risk medication 10/11/2015  . Xerosis of skin 12/12/2014  . Restless legs syndrome 07/12/2014  . Restlessness and agitation 09/10/2012  . Routine general medical examination at a  health care facility 06/08/2012  . Cerebral palsy (HCC) 05/08/2012  . Acneiform dermatitis 06/30/2011  . Partial epilepsy with intractable epilepsy (HCC) 03/23/2011    Past Surgical History:  Procedure Laterality Date  . ABDOMINAL HYSTERECTOMY  2005  . BRAIN SURGERY     Temporal Lobe resection  . CRANIOTOMY FOR TEMPORAL LOBECTOMY    . TONSILLECTOMY AND ADENOIDECTOMY  age 86    OB History   No obstetric history on file.      Home Medications    Prior to Admission medications   Medication Sig Start Date End Date Taking? Authorizing Provider  diazepam (DIASTAT ACUDIAL) 10 MG GEL Place 20 mg rectally once.    [provider]  diazepam (VALIUM) 10 MG tablet Take 1 tablet (10 mg total) by mouth every 12 (twelve) hours as needed for anxiety. 08/24/19   Sherlene Shams, MD  LAMICTAL 100 MG tablet Take 100 mg by mouth 2 (two) times daily.  08/03/16   [provider]  levETIRAcetam (KEPPRA) 750 MG tablet Take 750 mg by mouth 2 (two) times daily.    [provider]  melatonin 5 MG TABS Take 5 mg by mouth.    [provider]  PARoxetine (PAXIL) 10 MG tablet TAKE 1 TABLET BY MOUTH DAILY 11/04/19   Sherlene Shams, MD  predniSONE (STERAPRED UNI-PAK 21 TAB) 10 MG (21) TBPK tablet Take by mouth daily. As directed 12/18/19   Mickie Bail, NP  triamcinolone cream (KENALOG) 0.1 % Apply 1 application topically 2 (two) times daily. 10/29/19   Sherlene Shams, MD    Family History History reviewed. No pertinent family history.  Social History Social History   Tobacco Use  . Smoking status: Never Smoker  . Smokeless tobacco: Never Used  Vaping Use  . Vaping Use: Never used  Substance Use Topics  . Alcohol use: No    Alcohol/week: 0.0 standard drinks  . Drug use: No     Allergies   Influenza vac split quad, Benadryl [diphenhydramine hcl], Requip [ropinirole], and Zyrtec [cetirizine hcl]   Review of Systems Review of Systems  Constitutional: Negative  for chills and fever.  HENT: Positive for facial swelling. Negative for ear pain and sore throat.   Eyes: Negative for pain and visual disturbance.  Respiratory: Negative for cough and shortness of breath.   Cardiovascular: Negative for chest pain and palpitations.  Gastrointestinal: Negative for abdominal pain, diarrhea and vomiting.  Genitourinary: Negative for dysuria and hematuria.  Musculoskeletal: Negative for arthralgias and back pain.  Skin: Negative for color change and rash.  Neurological: Negative for seizures and syncope.  All other systems reviewed and are negative.    Physical Exam Triage Vital Signs ED Triage Vitals  Enc Vitals Group     BP      Pulse      Resp      Temp      Temp src      SpO2      Weight      Height      Head Circumference      Peak Flow      Pain Score      Pain Loc      Pain Edu?      Excl. in GC?    No data found.  Updated Vital Signs BP 101/70   Pulse 81   Temp 98.1 F (36.7 C)   Resp 17   LMP 01/09/2011   SpO2 96%   Visual Acuity Right Eye Distance:   Left Eye Distance:   Bilateral Distance:    Right Eye Near:   Left Eye Near:    Bilateral Near:     Physical Exam Vitals and nursing note reviewed.  Constitutional:      General: She is not in acute distress.    Appearance: She is well-developed. She is not ill-appearing.  HENT:     Head: Normocephalic and atraumatic.     Right Ear: Tympanic membrane normal.     Left Ear: Tympanic membrane normal.     Nose: Nose normal.     Mouth/Throat:     Mouth: Mucous membranes are moist.     Pharynx: Oropharynx is clear.     Comments: Right cheek and lips edematous.  No swelling of tongue or oropharynx.  No difficulty swallowing.  Speech and voice at baseline.  Eyes:     Conjunctiva/sclera: Conjunctivae normal.  Cardiovascular:     Rate and Rhythm: Normal rate and regular rhythm.     Heart sounds: No murmur heard.   Pulmonary:     Effort: Pulmonary effort is normal. No  respiratory distress.     Breath sounds: Normal breath sounds. No wheezing or rhonchi.  Abdominal:     Palpations: Abdomen is soft.     Tenderness: There is no abdominal tenderness. There is no guarding or rebound.  Musculoskeletal:     Cervical back: Neck supple.  Skin:  General: Skin is warm and dry.     Findings: No rash.  Neurological:     Mental Status: She is alert. Mental status is at baseline.      UC Treatments / Results  Labs (all labs ordered are listed, but only abnormal results are displayed) Labs Reviewed - No data to display  EKG   Radiology No results found.  Procedures Procedures (including critical care time)  Medications Ordered in UC Medications - No data to display  Initial Impression / Assessment and Plan / UC Course  I have reviewed the triage vital signs and the nursing notes.  Pertinent labs & imaging results that were available during my care of the patient were reviewed by me and considered in my medical decision making (see chart for details).   Facial swelling, right side.  Treating with prednisone taper.  Mother already gave Claritin at home.  Patient cannot take Benadryl.  Instructed mother to follow-up with her PCP if Eyvette's symptoms are not improving.  Instructed her to call 911 or go to the ED if she has any difficulty swallowing or breathing.  Mother agrees to plan of care.   Final Clinical Impressions(s) / UC Diagnoses   Final diagnoses:  Facial swelling     Discharge Instructions     Continue the Claritin as directed by your primary care provider.    Give Teyana the prednisone as directed.    Follow up with your primary care provider if her symptoms are not improving.    Call 911 or go to the Emergency Department if she has any difficulty swallowing or breathing.       ED Prescriptions    Medication Sig Dispense Auth. Provider   predniSONE (STERAPRED UNI-PAK 21 TAB) 10 MG (21) TBPK tablet Take by mouth  daily. As directed 21 tablet Mickie Bail, NP     PDMP not reviewed this encounter.   Mickie Bail, NP 12/18/19 [phone]

## 2019-12-18 NOTE — Discharge Instructions (Addendum)
Continue the Claritin as directed by your primary care provider.    Give Lindsey [name] the prednisone as directed.    Follow up with your primary care provider if her symptoms are not improving.    Call 911 or go to the Emergency Department if she has any difficulty swallowing or breathing.

## 2019-12-30 ENCOUNTER — Ambulatory Visit (INDEPENDENT_AMBULATORY_CARE_PROVIDER_SITE_OTHER): Payer: 59 | Admitting: Internal Medicine

## 2019-12-30 ENCOUNTER — Telehealth: Payer: Self-pay | Admitting: Internal Medicine

## 2019-12-30 ENCOUNTER — Other Ambulatory Visit: Payer: Self-pay

## 2019-12-30 ENCOUNTER — Encounter: Payer: Self-pay | Admitting: Internal Medicine

## 2019-12-30 VITALS — BP 98/64 | HR 75 | Temp 97.7°F | Resp 15 | Ht 65.0 in | Wt 120.4 lb

## 2019-12-30 DIAGNOSIS — L509 Urticaria, unspecified: Secondary | ICD-10-CM | POA: Diagnosis not present

## 2019-12-30 MED ORDER — MONTELUKAST SODIUM 10 MG PO TABS: 10.0000 mg | ORAL_TABLET | Freq: Every day | ORAL | 3 refills | Status: DC

## 2019-12-30 MED ORDER — EPINEPHRINE 0.3 MG/0.3ML IJ SOAJ: 0.3000 mg | INTRAMUSCULAR | 1 refills | Status: DC | PRN

## 2019-12-30 MED ORDER — LORATADINE 10 MG PO TABS: 10.0000 mg | ORAL_TABLET | Freq: Two times a day (BID) | ORAL | 11 refills | Status: DC

## 2019-12-30 MED ORDER — LORATADINE 10 MG PO TABS: 10.0000 mg | ORAL_TABLET | Freq: Four times a day (QID) | ORAL | 11 refills | Status: DC

## 2019-12-30 NOTE — Telephone Encounter (Signed)
Lindsey [name] at [facility] called and states that loratadine (CLARITIN) 10 MG tablet was sent in for four times a day and she states that that can not be done. Please call back

## 2019-12-30 NOTE — Progress Notes (Signed)
Subjective:  Patient ID: Lindsey [name], female    DOB: [birth year]  Age: 34 y.o. MRN: [id number]  CC: The primary encounter diagnosis was Hives. A diagnosis of Urticarial rash was also pertinent to this visit.  HPI [name] presents for evaluation and treatment of recurrent hives accompanied by facial swelling , lip swelling    Patient has received both doses of the available COVID 19 vaccine without complications.  Patient continues to mask when outside of the home except when walking in yard or at safe distances from others .  Patient denies any change in mood or development of unhealthy behaviors resuting from the pandemic's restriction of activities and socialization.      Lindsey [name] is a 34 yr old white female with cerebral palsy and seizure disorder s/p VNS who has been having episodes of urticarial rash accompanied by facial swelling and lip swelling since April 9.   The episodes have occurred both at home and when away from home.  The etiology is not clear.  There have been no recent medication changes except Requip,  Which was stopped and Paxil, which started  On April 19.  She received the Pfizer COVID 19  MRNA vaccine in February with no immediate reaction   She has had at least 4 episodes of body rash accompanied by facial swelling since early April .  Each episode resolved with prednisone taper and Claritin.   Allergy testing done recently  by ENT, reported by mother [name] (no report available) .  Dust mites was the biggest reactor.     Outpatient Medications Prior to Visit  Medication Sig Dispense Refill  . diazepam (DIASTAT ACUDIAL) 10 MG GEL Place 20 mg rectally once.    . diazepam (VALIUM) 10 MG tablet Take 1 tablet (10 mg total) by mouth every 12 (twelve) hours as needed for anxiety. 30 tablet 5  . LAMICTAL 100 MG tablet Take 100 mg by mouth 2 (two) times daily.     Marland Kitchen levETIRAcetam (KEPPRA) 750 MG tablet Take 750 mg by mouth 2 (two) times daily.    . melatonin  5 MG TABS Take 5 mg by mouth.    Marland Kitchen PARoxetine (PAXIL) 10 MG tablet TAKE 1 TABLET BY MOUTH DAILY 90 tablet 0  . triamcinolone cream (KENALOG) 0.1 % Apply 1 application topically 2 (two) times daily. 80 g 1  . predniSONE (STERAPRED UNI-PAK 21 TAB) 10 MG (21) TBPK tablet Take by mouth daily. As directed (Patient not taking: Reported on 12/30/2019) 21 tablet 0   No facility-administered medications prior to visit.    Review of Systems;  Patient denies headache, fevers, malaise, unintentional weight loss, , eye pain, sinus congestion and sinus pain, sore throat, dysphagia,  hemoptysis , cough, dyspnea, wheezing, chest pain, palpitations, orthopnea, edema, abdominal pain, nausea, melena, diarrhea, constipation, flank pain, dysuria, hematuria, urinary  Frequency, nocturia, numbness, tingling, seizures,  Focal weakness, Loss of consciousness,  Tremor, insomnia, depression, anxiety, and suicidal ideation.      Objective:  BP 98/64 (BP Location: Right Arm, Patient Position: Sitting, Cuff Size: Normal)   Pulse 75   Temp 97.7 F (36.5 C) (Axillary)   Resp 15   Ht 5\' 5"  (1.651 m)   Wt 120 lb 6.4 oz (54.6 kg)   LMP 01/09/2011   SpO2 97%   BMI 20.04 kg/m   BP Readings from Last 3 Encounters:  12/30/19 98/64  12/18/19 101/70  12/08/19 111/79    Wt Readings from Last 3 Encounters:  12/30/19 120 lb 6.4 oz (54.6 kg)  11/03/19 115 lb 12.8 oz (52.5 kg)  10/29/19 115 lb (52.2 kg)    General appearance: alert, cooperative and appears younger than stated age Ears: normal TM's and external ear canals both ears Throat: lips, mucosa, and tongue normal; teeth and gums normal.  No lip swelling currently  Neck: no adenopathy, no carotid bruit, supple, symmetrical, trachea midline and thyroid not enlarged, symmetric, no tenderness/mass/nodules Back: symmetric, no curvature. ROM normal. No CVA tenderness. Lungs: clear to auscultation bilaterally Heart: regular rate and rhythm, S1, S2 normal, no murmur,  click, rub or gallop Abdomen: soft, non-tender; bowel sounds normal; no masses,  no organomegaly Pulses: 2+ and symmetric Skin: Skin color, texture, turgor normal. No rashes or lesions currently.   Lymph nodes: Cervical, supraclavicular, and axillary nodes normal.  No results found for: HGBA1C  Lab Results  Component Value Date   CREATININE 0.68 08/24/2019   CREATININE 0.58 02/12/2019   CREATININE 0.57 03/10/2018    Lab Results  Component Value Date   WBC 4.7 12/30/2019   HGB 13.2 12/30/2019   HCT 38.6 12/30/2019   PLT 221.0 12/30/2019   GLUCOSE 93 08/24/2019   CHOL 113 02/12/2019   TRIG 59.0 02/12/2019   HDL 36.60 (L) 02/12/2019   LDLCALC 65 02/12/2019   ALT 7 08/24/2019   AST 13 08/24/2019   NA 135 08/24/2019   K 3.8 08/24/2019   CL 102 08/24/2019   CREATININE 0.68 08/24/2019   BUN 10 08/24/2019   CO2 27 08/24/2019   TSH 1.84 08/24/2019    No results found.  Assessment & Plan:   Problem List Items Addressed This Visit      Unprioritized   Urticarial rash    Etiology unclear.  First episode occurred prior to initiation of Paxil. Has had 4 episodes. Allergy testing done,  Records requested.  Continue claritin twice daily,  Adding Singulair,  Epi pen given.  Complement  Deficiency to be ruled out       Other Visit Diagnoses    Hives    -  Primary   Relevant Orders   CBC with Differential/Platelet (Completed)   Complement component c1q   C1 Esterase Inhibitor (Completed)   Complement, total      I have discontinued Leondra E. Cross's predniSONE. I am also having her start on montelukast and EPINEPHrine. Additionally, I am having her maintain her diazepam, LaMICtal, levETIRAcetam, diazepam, melatonin, triamcinolone cream, and PARoxetine.  Meds ordered this encounter  Medications  . montelukast (SINGULAIR) 10 MG tablet    Sig: Take 1 tablet (10 mg total) by mouth at bedtime.    Dispense:  30 tablet    Refill:  3  . EPINEPHrine 0.3 mg/0.3 mL IJ SOAJ  injection    Sig: Inject 0.3 mLs (0.3 mg total) into the muscle as needed for anaphylaxis.    Dispense:  1 each    Refill:  1  . DISCONTD: loratadine (CLARITIN) 10 MG tablet    Sig: Take 1 tablet (10 mg total) by mouth in the morning, at noon, in the evening, and at bedtime.    Dispense:  120 tablet    Refill:  11    Medications Discontinued During This Encounter  Medication Reason  . predniSONE (STERAPRED UNI-PAK 21 TAB) 10 MG (21) TBPK tablet     Follow-up: No follow-ups on file.   Sherlene Shams, MD

## 2019-12-30 NOTE — Telephone Encounter (Signed)
THE 4 TIMES DAILY DOSE IS FOR CHRONIC SPONTANEOUS URTICARIA.  BUT SINCE THEY WOULD NOT FILL, I HAVE REDUCED IT TO TWO TIMES DAILY

## 2019-12-30 NOTE — Patient Instructions (Addendum)
Continue claritin up to 4 times daily (at least twice daily every day)   Adding Singulair once daily to help reduce the allergic response  Use epi pen for tongue or lip swelling

## 2019-12-31 LAB — CBC WITH DIFFERENTIAL/PLATELET
Basophils Absolute: 0 10*3/uL (ref 0.0–0.1)
Basophils Relative: 1 % (ref 0.0–3.0)
Eosinophils Absolute: 0 10*3/uL (ref 0.0–0.7)
Eosinophils Relative: 0.1 % (ref 0.0–5.0)
HCT: 38.6 % (ref 36.0–46.0)
Hemoglobin: 13.2 g/dL (ref 12.0–15.0)
Lymphocytes Relative: 31 % (ref 12.0–46.0)
Lymphs Abs: 1.4 10*3/uL (ref 0.7–4.0)
MCHC: 34.3 g/dL (ref 30.0–36.0)
MCV: 86.5 fl (ref 78.0–100.0)
Monocytes Absolute: 0.3 10*3/uL (ref 0.1–1.0)
Monocytes Relative: 6.1 % (ref 3.0–12.0)
Neutro Abs: 2.9 10*3/uL (ref 1.4–7.7)
Neutrophils Relative %: 61.8 % (ref 43.0–77.0)
Platelets: 221 10*3/uL (ref 150.0–400.0)
RBC: 4.47 Mil/uL (ref 3.87–5.11)
RDW: 12.7 % (ref 11.5–15.5)
WBC: 4.7 10*3/uL (ref 4.0–10.5)

## 2019-12-31 LAB — C1 ESTERASE INHIBITOR: C1INH SerPl-mCnc: 26 mg/dL (ref 21–39)

## 2019-12-31 LAB — COMPLEMENT, TOTAL: Compl, Total (CH50): 57 U/mL (ref 31–60)

## 2019-12-31 NOTE — Telephone Encounter (Signed)
Spoke with [name] at [facility] and informed her of Dr. Melina Schools message below.

## 2019-12-31 NOTE — Assessment & Plan Note (Addendum)
Etiology unclear.  First episode occurred prior to initiation of Paxil. Has had 4 episodes. Allergy testing done,  Records requested.  Continue claritin twice daily,  Adding Singulair,  Epi pen given.  Complement  Deficiency to be ruled out

## 2020-01-04 NOTE — Telephone Encounter (Signed)
Pt's mother dropped off form for physician's order. Placed up front with envelope to be mailed back to pt

## 2020-01-05 LAB — COMPLEMENT COMPONENT C1Q: Complement C1Q: 5.6 mg/dL (ref 5.0–8.6)

## 2020-01-07 ENCOUNTER — Ambulatory Visit: Payer: 59 | Admitting: Internal Medicine

## 2020-02-01 IMAGING — DX DG CHEST 2V
2 series · 2 of 2 positions shown · non-contrast
Comparison: 06/12/2017

CLINICAL DATA: Cough and fever 2 weeks.

EXAM:
CHEST - 2 VIEW

[chest pa]
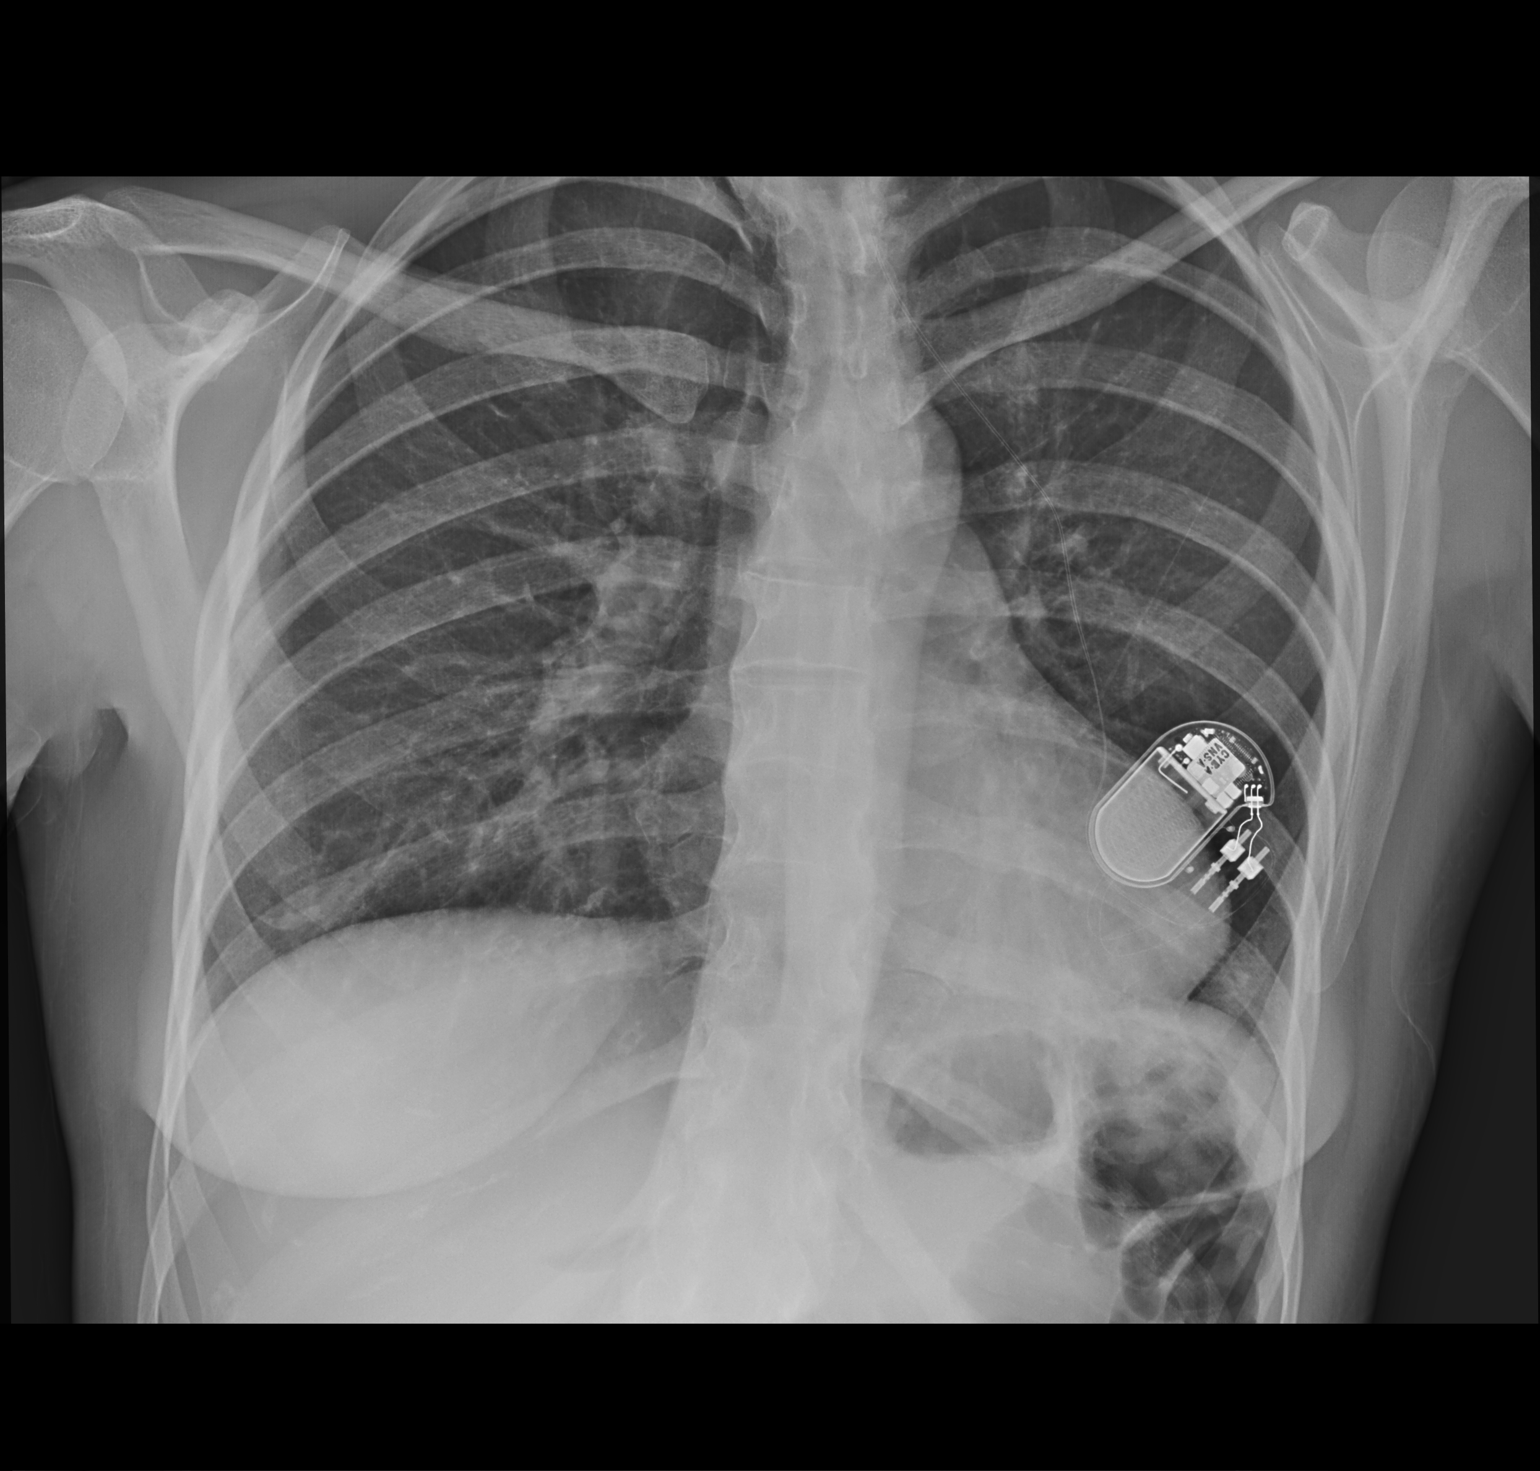

[chest lat]
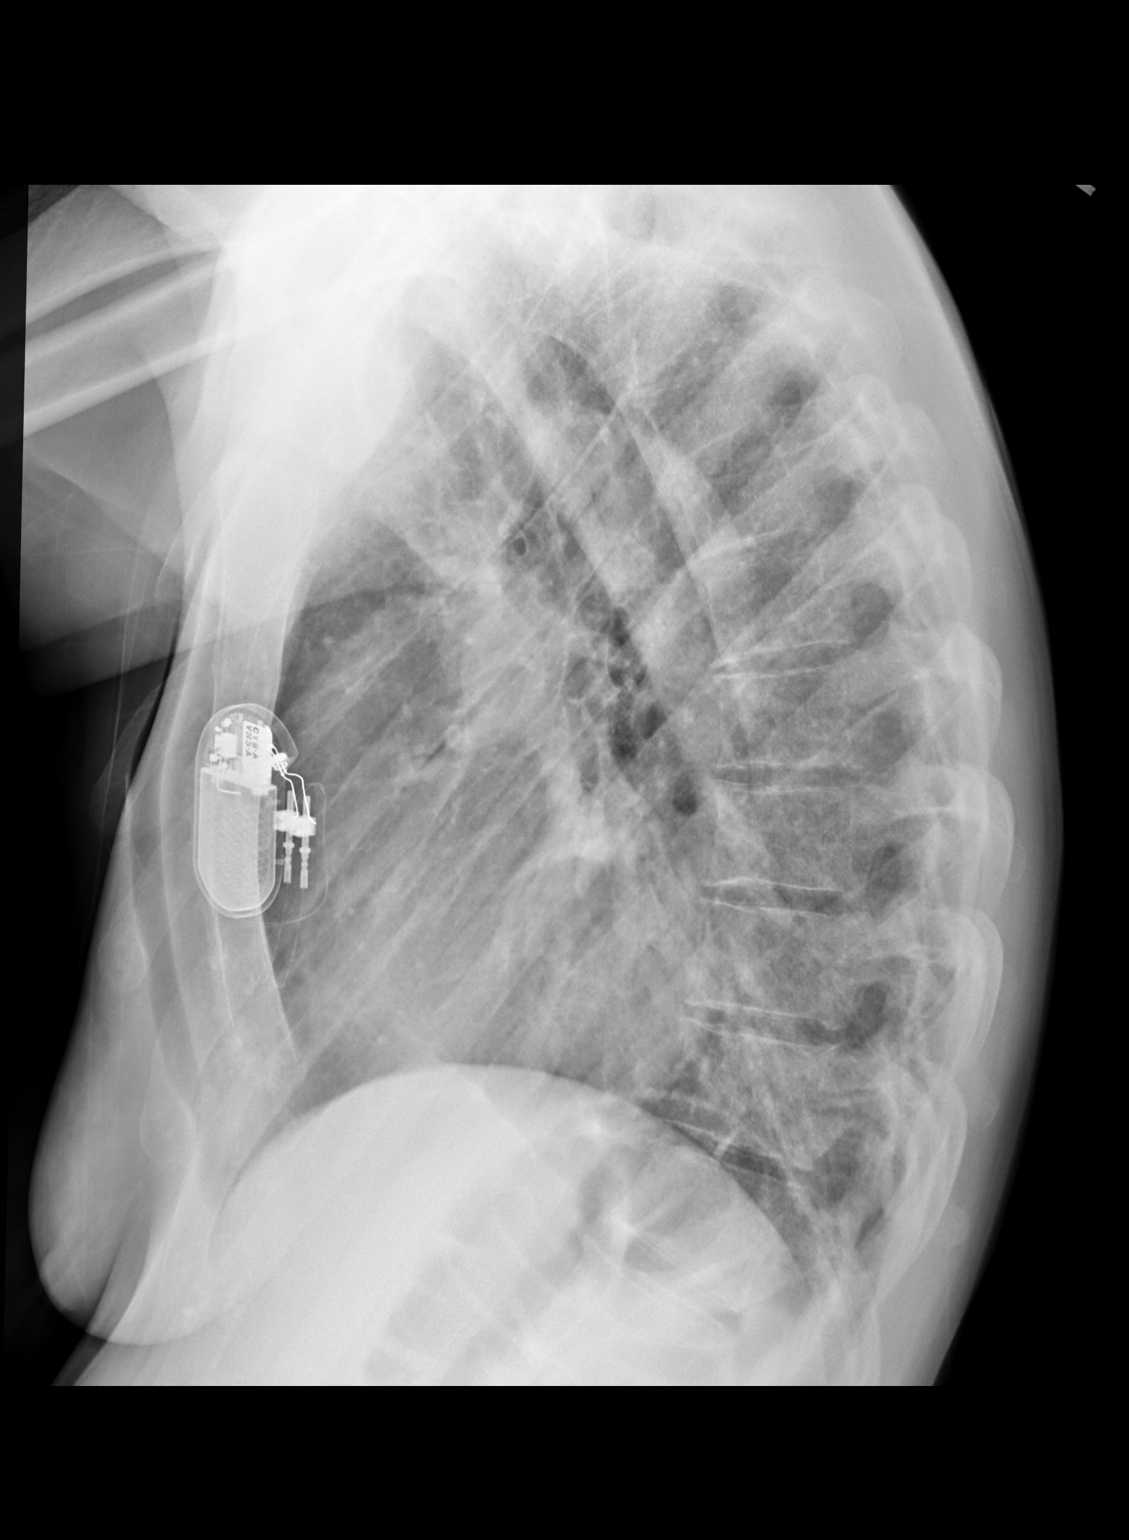

[2 of 2 positions shown; findings below may reference images not displayed]

FINDINGS: Patient is rotated to the right. Left-sided neural stimulator
unchanged. Lungs are adequately inflated without consolidation or
effusion. Cardiomediastinal silhouette and remainder of the exam is
unchanged.
IMPRESSION: No active cardiopulmonary disease.

## 2020-02-08 ENCOUNTER — Other Ambulatory Visit: Payer: Self-pay | Admitting: Internal Medicine

## 2020-02-19 ENCOUNTER — Other Ambulatory Visit: Payer: Self-pay | Admitting: Internal Medicine

## 2020-02-19 MED ORDER — PAROXETINE HCL 10 MG PO TABS: 10.0000 mg | ORAL_TABLET | Freq: Every day | ORAL | 1 refills | Status: DC

## 2020-02-19 NOTE — Addendum Note (Signed)
Addended by: Hulan Fray on: 02/19/2020 10:47 AM   Modules accepted: Orders

## 2020-03-14 ENCOUNTER — Other Ambulatory Visit: Payer: Self-pay

## 2020-03-14 ENCOUNTER — Encounter: Payer: Self-pay | Admitting: Internal Medicine

## 2020-03-14 ENCOUNTER — Ambulatory Visit (INDEPENDENT_AMBULATORY_CARE_PROVIDER_SITE_OTHER): Payer: 59 | Admitting: Internal Medicine

## 2020-03-14 VITALS — BP 114/78 | HR 91 | Temp 97.6°F | Resp 16 | Ht 65.0 in | Wt 132.8 lb

## 2020-03-14 DIAGNOSIS — L509 Urticaria, unspecified: Secondary | ICD-10-CM

## 2020-03-14 DIAGNOSIS — R220 Localized swelling, mass and lump, head: Secondary | ICD-10-CM

## 2020-03-14 DIAGNOSIS — Z0000 Encounter for general adult medical examination without abnormal findings: Secondary | ICD-10-CM | POA: Diagnosis not present

## 2020-03-14 DIAGNOSIS — G809 Cerebral palsy, unspecified: Secondary | ICD-10-CM | POA: Diagnosis not present

## 2020-03-14 DIAGNOSIS — K5900 Constipation, unspecified: Secondary | ICD-10-CM

## 2020-03-14 NOTE — Assessment & Plan Note (Signed)
.  Annual comprehensive preventive exam was done as well as an evaluation and management of chronic conditions .  During the course of the visit the patient's mother  was educated and counseled about appropriate screening and preventive services including :  diabetes screening, lipid analysis with projected  10 year  risk for CAD , nutrition counseling,  and recommended immunizations.  Printed recommendations for health maintenance screenings was given

## 2020-03-14 NOTE — Assessment & Plan Note (Signed)
Etiology unclear.  First episode occurred prior to initiation of Paxil. Has had 4 episodes. Allergy testing done,  Records requested.  Continue claritin twice daily,   DID NOT TOLERATE  Singulair,  Epi pen given.

## 2020-03-14 NOTE — Assessment & Plan Note (Signed)
With intellectual disability and seizure disorder.  History of temporal lobe resection .  She remains dependent on mother

## 2020-03-14 NOTE — Patient Instructions (Addendum)
Increase Paxil to 20 mg daily : one pill at breakfast , the other at 5 pm

## 2020-03-14 NOTE — Progress Notes (Signed)
Patient ID: Lindsey [name], female    DOB: Feb 09, 1986  Age: 34 y.o. MRN: [id number]  The patient is here for annual wellness examination and management of other chronic and acute problems.  This visit occurred during the SARS-CoV-2 public health emergency.  Safety protocols were in place, including screening questions prior to the visit, additional usage of staff PPE, and extensive cleaning of exam room while observing appropriate contact time as indicated for disinfecting solutions.    Patient has received NO  doses of the available COVID 19 vaccines.   Patient continues to mask when outside of the home except when walking in yard or at safe distances from others .  Patient denies any change in mood or development of unhealthy behaviors resuting from the pandemic's restriction of activities and socialization.       The risk factors are reflected in the social history.  The roster of all physicians providing medical care to patient - is listed in the Snapshot section of the chart.  Activities of daily living:  The patient is 100% independent in all ADLs: dressing, toileting, feeding as well as independent mobility  Home safety : The patient has smoke detectors in the home. They wear seatbelts.  There are no firearms at home. There is no violence in the home.   There is no risks for hepatitis, STDs or HIV. There is no   history of blood transfusion. They have no travel history to infectious disease endemic areas of the world.  The patient has seen their dentist in the last six month. They have seen their eye doctor in the last year. They admit to slight hearing difficulty with regard to whispered voices and some television programs.  They have deferred audiologic testing in the last year.  They do not  have excessive sun exposure. Discussed the need for sun protection: hats, long sleeves and use of sunscreen if there is significant sun exposure.   Diet: the importance of a healthy diet is  discussed.  Her mother has been indulging her somewhat with donuts and bundt cakes, and she has gained 12 lbs since August .  The benefits of regular aerobic exercise were discussed. She walks with her mother several times per week    Depression screen: there are no signs or vegative symptoms of depression- irritability, change in appetite, anhedonia, sadness/tearfullness.  Cognitive assessment: the patient is unable to manage any financial and personal affairs.  She is dependent on her mother.  The following portions of the patient's history were reviewed and updated as appropriate: allergies, current medications, past family history, past medical history,  past surgical history, past social history  and problem list.  Visual acuity was not assessed per patient preference since she has regular follow up with her ophthalmologist. Hearing and body mass index were assessed and reviewed.   During the course of the visit the patient was educated and counseled about appropriate screening and preventive services including : fall prevention , diabetes screening, nutrition counseling, colorectal cancer screening, and recommended immunizations.    CC: Diagnoses of Cerebral palsy, unspecified type (HCC), Constipation, unspecified constipation type, Right facial swelling, Urticarial rash, and Routine general medical examination at a health care facility were pertinent to this visit.  1) GAD:  She is tolerating Paxil for GAD but continues to have occasional temper tantrums with behavioral  Disturbances.  Taking 10 mg diazepam for 7 pm for restlessness at night.  Discussed increasing dose to 20 mg daily in split doses.  2) appetite good.  Gaining weight eating donuts and bundt cakes  3) had a hives reaction to nuts and to animal dander .  Managed with claritin 10 mg bid . DID NOT TOLERATE SINGULAIR  History Lindsey [name] has a past medical history of Epilepsy (HCC) (age 73), Insomnia, Status post VNS (vagus  nerve stimulator) placement (2000), and Temporal lobectomy behavior syndrome (1994).   She has a past surgical history that includes Tonsillectomy and adenoidectomy (age 51); Abdominal hysterectomy (2005); Brain surgery; and Craniotomy for temporal lobectomy.   Her family history is not on file.She reports that she has never smoked. She has never used smokeless tobacco. She reports that she does not drink alcohol and does not use drugs.  Outpatient Medications Prior to Visit  Medication Sig Dispense Refill  . diazepam (DIASTAT ACUDIAL) 10 MG GEL Place 20 mg rectally once.    . diazepam (VALIUM) 10 MG tablet TAKE ONE TABLET EVERY TWELVE HOURS IF NEEDED FOR ANXIETY 30 tablet 5  . EPINEPHrine 0.3 mg/0.3 mL IJ SOAJ injection Inject 0.3 mLs (0.3 mg total) into the muscle as needed for anaphylaxis. 1 each 1  . LAMICTAL 100 MG tablet Take 100 mg by mouth 2 (two) times daily.     Marland Kitchen levETIRAcetam (KEPPRA) 750 MG tablet Take 750 mg by mouth 2 (two) times daily.    Marland Kitchen loratadine (CLARITIN) 10 MG tablet Take 1 tablet (10 mg total) by mouth in the morning and at bedtime. 60 tablet 11  . melatonin 5 MG TABS Take 5 mg by mouth.    . montelukast (SINGULAIR) 10 MG tablet Take 1 tablet (10 mg total) by mouth at bedtime. 30 tablet 3  . PARoxetine (PAXIL) 10 MG tablet Take 1 tablet (10 mg total) by mouth daily. 90 tablet 1  . triamcinolone cream (KENALOG) 0.1 % Apply 1 application topically 2 (two) times daily. 80 g 1  . chlorhexidine (PERIDEX) 0.12 % solution 15 mLs 2 (two) times daily.     No facility-administered medications prior to visit.    Review of Systems   Patient denies headache, fevers, malaise, unintentional weight loss, skin rash, eye pain, sinus congestion and sinus pain, sore throat, dysphagia,  hemoptysis , cough, dyspnea, wheezing, chest pain, palpitations, orthopnea, edema, abdominal pain, nausea, melena, diarrhea, constipation, flank pain, dysuria, hematuria, urinary  Frequency, nocturia,  numbness, tingling, seizures,  Focal weakness, Loss of consciousness,  Tremor, insomnia, depression, anxiety, and suicidal ideation.     Objective:  BP 114/78 (BP Location: Left Arm, Patient Position: Sitting, Cuff Size: Normal)   Pulse 91   Temp 97.6 F (36.4 C) (Oral)   Resp 16   Ht 5\' 5"  (1.651 m)   Wt 132 lb 12.8 oz (60.2 kg)   LMP 01/09/2011   SpO2 98%   BMI 22.10 kg/m   Physical Exam  General appearance: alert, cooperative and appears younger than stated age Ears: normal TM's and external ear canals both ears Throat: lips, mucosa, and tongue normal; teeth and gums normal Neck: no adenopathy, no carotid bruit, supple, symmetrical, trachea midline and thyroid not enlarged, symmetric, no tenderness/mass/nodules Back: symmetric, no curvature. ROM normal. No CVA tenderness. Lungs: clear to auscultation bilaterally Heart: regular rate and rhythm, S1, S2 normal, no murmur, click, rub or gallop Abdomen: soft, non-tender; bowel sounds normal; no masses,  no organomegaly Pulses: 2+ and symmetric Skin: Skin color, texture, turgor normal. No rashes or lesions Lymph nodes: Cervical, supraclavicular, and axillary nodes normal.   Assessment & Plan:  Problem List Items Addressed This Visit      Unprioritized   Cerebral palsy (HCC)    With intellectual disability and seizure disorder.  History of temporal lobe resection .  She remains dependent on mother       Constipation    Currently managed with every other day  use of dulcolax.        Right facial swelling    Resolved with increased use of claritin to 10 mg bid       Routine general medical examination at a health care facility    .Annual comprehensive preventive exam was done as well as an evaluation and management of chronic conditions .  During the course of the visit the patient's mother  was educated and counseled about appropriate screening and preventive services including :  diabetes screening, lipid analysis with  projected  10 year  risk for CAD , nutrition counseling,  and recommended immunizations.  Printed recommendations for health maintenance screenings was given      Urticarial rash    Etiology unclear.  First episode occurred prior to initiation of Paxil. Has had 4 episodes. Allergy testing done,  Records requested.  Continue claritin twice daily,   DID NOT TOLERATE  Singulair,  Epi pen given.           I am having Chazlyn E. Menna maintain her diazepam, LaMICtal, levETIRAcetam, melatonin, triamcinolone cream, montelukast, EPINEPHrine, loratadine, diazepam, PARoxetine, and chlorhexidine.  No orders of the defined types were placed in this encounter.   There are no discontinued medications.  Follow-up: No follow-ups on file.   Sherlene Shams, MD

## 2020-03-14 NOTE — Assessment & Plan Note (Signed)
Resolved with increased use of claritin to 10 mg bid

## 2020-03-14 NOTE — Assessment & Plan Note (Signed)
Currently managed with every other day  use of dulcolax.

## 2020-03-17 ENCOUNTER — Ambulatory Visit
Admission: EM | Admit: 2020-03-17 | Discharge: 2020-03-17 | Disposition: A | Discharge status: Home / Self Care | Payer: 59 | Attending: Emergency Medicine | Admitting: Emergency Medicine

## 2020-03-17 ENCOUNTER — Other Ambulatory Visit: Payer: Self-pay

## 2020-03-17 DIAGNOSIS — B349 Viral infection, unspecified: Secondary | ICD-10-CM

## 2020-03-17 NOTE — ED Triage Notes (Signed)
Mom reports runny nose, cough, sneezing symptoms started Tuesday night. Mom states she gave pt ibuprofen at 0630 and cold/cough medication.

## 2020-03-17 NOTE — Discharge Instructions (Addendum)
The COVID / Influenza / RSV tests are pending.  She should self quarantine until the results are back.    Give her Tylenol as needed for fever or discomfort.  Have her rest and stay hydrated.    Follow up with her primary care provider if her symptoms are not improving.

## 2020-03-17 NOTE — ED Provider Notes (Signed)
[name]    CSN: [id number] Arrival date & time: 03/17/20  2297      History   Chief Complaint Chief Complaint  Patient presents with  . Cough  . Nasal Congestion    HPI Lindsey [name] is a 34 y.o. female.   Accompanied by her mother, patient presents with runny nose, nonproductive cough, sneezing x2 days.  OTC treatment attempted at home with cold medication.  Mother reports T-max 99.7.  She denies rash, difficulty breathing, vomiting, diarrhea, or other symptoms.  She was seen by her PCP on 03/14/2020 for routine general medical examination; no changes in medications.  Her medical history includes cerebral palsy, epilepsy, RLS, urticaria, facial swelling.  The history is provided by the patient and a parent.    Past Medical History:  Diagnosis Date  . Epilepsy [facility]) age 89   negative metabolic workup has brain abnormalities by MRI (Dr. Quintin Alto, [facility])  . Insomnia   . Status post VNS (vagus nerve stimulator) placement 2000  . Temporal lobectomy behavior syndrome 1994   Right    Patient Active Problem List   Diagnosis Date Noted  . Urticarial rash 10/30/2019  . Right facial swelling 10/30/2019  . Poor dentition 09/02/2017  . Constipation 11/10/2015  . Long-term use of high-risk medication 10/11/2015  . Xerosis of skin 12/12/2014  . Restless legs syndrome 07/12/2014  . Restlessness and agitation 09/10/2012  . Routine general medical examination at a health care facility 06/08/2012  . Cerebral palsy (HCC) 05/08/2012  . Acneiform dermatitis 06/30/2011  . Partial epilepsy with intractable epilepsy (HCC) 03/23/2011    Past Surgical History:  Procedure Laterality Date  . ABDOMINAL HYSTERECTOMY  2005  . BRAIN SURGERY     Temporal Lobe resection  . CRANIOTOMY FOR TEMPORAL LOBECTOMY    . TONSILLECTOMY AND ADENOIDECTOMY  age 42    OB History   No obstetric history on file.      Home Medications    Prior to Admission medications   Medication Sig  Start Date End Date Taking? Authorizing Provider  chlorhexidine (PERIDEX) 0.12 % solution 15 mLs 2 (two) times daily. 02/10/20   [provider]  diazepam (DIASTAT ACUDIAL) 10 MG GEL Place 20 mg rectally once.    [provider]  diazepam (VALIUM) 10 MG tablet TAKE ONE TABLET EVERY TWELVE HOURS IF NEEDED FOR ANXIETY 02/08/20   Sherlene Shams, MD  EPINEPHrine 0.3 mg/0.3 mL IJ SOAJ injection Inject 0.3 mLs (0.3 mg total) into the muscle as needed for anaphylaxis. 12/30/19   Sherlene Shams, MD  LAMICTAL 100 MG tablet Take 100 mg by mouth 2 (two) times daily.  08/03/16   [provider]  levETIRAcetam (KEPPRA) 750 MG tablet Take 750 mg by mouth 2 (two) times daily.    [provider]  loratadine (CLARITIN) 10 MG tablet Take 1 tablet (10 mg total) by mouth in the morning and at bedtime. 12/30/19   Sherlene Shams, MD  melatonin 5 MG TABS Take 5 mg by mouth.    [provider]  montelukast (SINGULAIR) 10 MG tablet Take 1 tablet (10 mg total) by mouth at bedtime. 12/30/19   Sherlene Shams, MD  PARoxetine (PAXIL) 10 MG tablet Take 1 tablet (10 mg total) by mouth daily. 02/19/20   Sherlene Shams, MD  triamcinolone cream (KENALOG) 0.1 % Apply 1 application topically 2 (two) times daily. 10/29/19   Sherlene Shams, MD    Family History History reviewed.  No pertinent family history.  Social History Social History   Tobacco Use  . Smoking status: Never Smoker  . Smokeless tobacco: Never Used  Vaping Use  . Vaping Use: Never used  Substance Use Topics  . Alcohol use: No    Alcohol/week: 0.0 standard drinks  . Drug use: No     Allergies   Influenza vac split quad, Benadryl [diphenhydramine hcl], Requip [ropinirole], Singulair [montelukast], Zyrtec [cetirizine hcl], and Peanut-containing drug products   Review of Systems Review of Systems  Constitutional: Negative for chills and fever.  HENT: Positive for rhinorrhea and sneezing. Negative for ear pain  and sore throat.   Eyes: Negative for pain and visual disturbance.  Respiratory: Positive for cough. Negative for shortness of breath.   Cardiovascular: Negative for chest pain and palpitations.  Gastrointestinal: Negative for abdominal pain, diarrhea and vomiting.  Genitourinary: Negative for dysuria and hematuria.  Musculoskeletal: Negative for arthralgias and back pain.  Skin: Negative for color change and rash.  Neurological: Negative for seizures and syncope.  All other systems reviewed and are negative.    Physical Exam Triage Vital Signs ED Triage Vitals  Enc Vitals Group     BP      Pulse      Resp      Temp      Temp src      SpO2      Weight      Height      Head Circumference      Peak Flow      Pain Score      Pain Loc      Pain Edu?      Excl. in GC?    No data found.  Updated Vital Signs BP 108/76   Pulse 96   Temp 98.1 F (36.7 C) (Axillary)   Resp 16   LMP 01/09/2011   SpO2 96%   Visual Acuity Right Eye Distance:   Left Eye Distance:   Bilateral Distance:    Right Eye Near:   Left Eye Near:    Bilateral Near:     Physical Exam Vitals and nursing note reviewed.  Constitutional:      General: She is not in acute distress.    Appearance: She is well-developed. She is not ill-appearing.  HENT:     Head: Normocephalic and atraumatic.     Right Ear: Tympanic membrane normal.     Left Ear: Tympanic membrane normal.     Nose: Rhinorrhea present.     Mouth/Throat:     Mouth: Mucous membranes are moist.     Pharynx: Oropharynx is clear.  Eyes:     Conjunctiva/sclera: Conjunctivae normal.  Cardiovascular:     Rate and Rhythm: Normal rate and regular rhythm.     Heart sounds: Normal heart sounds.  Pulmonary:     Effort: Pulmonary effort is normal. No respiratory distress.     Breath sounds: Normal breath sounds.  Abdominal:     Palpations: Abdomen is soft.     Tenderness: There is no abdominal tenderness. There is no guarding or rebound.    Musculoskeletal:     Cervical back: Neck supple.  Skin:    General: Skin is warm and dry.     Findings: No rash.  Neurological:     Mental Status: She is alert. Mental status is at baseline.      UC Treatments / Results  Labs (all labs ordered are listed, but only abnormal results are  displayed) Labs Reviewed  NOVEL CORONAVIRUS, NAA  COVID-19, FLU A+B AND RSV    EKG   Radiology No results found.  Procedures Procedures (including critical care time)  Medications Ordered in UC Medications - No data to display  Initial Impression / Assessment and Plan / UC Course  I have reviewed the triage vital signs and the nursing notes.  Pertinent labs & imaging results that were available during my care of the patient were reviewed by me and considered in my medical decision making (see chart for details).   Viral illness.  Covid/flu/RSV pending.  Discussed symptomatic treatment.  Instructed her to self quarantine until the test results are back.  Instructed her to follow-up with her PCP if her symptoms are not improving.  Patient and her mother agrees to plan of care   Final Clinical Impressions(s) / UC Diagnoses   Final diagnoses:  Viral illness     Discharge Instructions     The COVID / Influenza / RSV tests are pending.  She should self quarantine until the results are back.    Give her Tylenol as needed for fever or discomfort.  Have her rest and stay hydrated.    Follow up with her primary care provider if her symptoms are not improving.         ED Prescriptions    None     PDMP not reviewed this encounter.   Mickie Bail, NP 03/17/20 [phone]

## 2020-03-18 LAB — COVID-19, FLU A+B AND RSV
Influenza A, NAA: NOT DETECTED
Influenza B, NAA: NOT DETECTED
RSV, NAA: NOT DETECTED
SARS-CoV-2, NAA: NOT DETECTED

## 2020-03-22 MED ORDER — PAROXETINE HCL 10 MG PO TABS: 10.0000 mg | ORAL_TABLET | Freq: Every day | ORAL | 1 refills | Status: DC

## 2020-03-22 MED ORDER — PAROXETINE HCL 10 MG PO TABS: 10.0000 mg | ORAL_TABLET | Freq: Two times a day (BID) | ORAL | 1 refills | Status: DC

## 2020-03-22 NOTE — Telephone Encounter (Signed)
Pt mother called and said that the old rx for PARoxetine (PAXIL) 10 MG tablet was called in she said that Dr. Darrick Huntsman increased it to two pills a day not one

## 2020-03-22 NOTE — Addendum Note (Signed)
Addended by: Sandy Salaam on: 03/22/2020 08:52 AM   Modules accepted: Orders

## 2020-04-08 DIAGNOSIS — F419 Anxiety disorder, unspecified: Secondary | ICD-10-CM

## 2020-04-12 ENCOUNTER — Emergency Department: Payer: 59

## 2020-04-12 ENCOUNTER — Other Ambulatory Visit: Payer: Self-pay

## 2020-04-12 ENCOUNTER — Emergency Department
Admission: EM | Admit: 2020-04-12 | Discharge: 2020-04-12 | Disposition: A | Discharge status: Home / Self Care | Payer: 59 | Attending: Emergency Medicine | Admitting: Emergency Medicine

## 2020-04-12 ENCOUNTER — Encounter: Payer: Self-pay | Admitting: Intensive Care

## 2020-04-12 DIAGNOSIS — R569 Unspecified convulsions: Secondary | ICD-10-CM | POA: Insufficient documentation

## 2020-04-12 DIAGNOSIS — Z79899 Other long term (current) drug therapy: Secondary | ICD-10-CM | POA: Insufficient documentation

## 2020-04-12 DIAGNOSIS — F69 Unspecified disorder of adult personality and behavior: Secondary | ICD-10-CM | POA: Diagnosis not present

## 2020-04-12 DIAGNOSIS — R451 Restlessness and agitation: Secondary | ICD-10-CM | POA: Diagnosis not present

## 2020-04-12 DIAGNOSIS — G809 Cerebral palsy, unspecified: Secondary | ICD-10-CM | POA: Diagnosis present

## 2020-04-12 DIAGNOSIS — Z91010 Allergy to peanuts: Secondary | ICD-10-CM | POA: Insufficient documentation

## 2020-04-12 DIAGNOSIS — G40119 Localization-related (focal) (partial) symptomatic epilepsy and epileptic syndromes with simple partial seizures, intractable, without status epilepticus: Secondary | ICD-10-CM | POA: Diagnosis present

## 2020-04-12 DIAGNOSIS — F99 Mental disorder, not otherwise specified: Secondary | ICD-10-CM | POA: Diagnosis present

## 2020-04-12 LAB — COMPREHENSIVE METABOLIC PANEL
ALT: 12 U/L (ref 0–44)
AST: 17 U/L (ref 15–41)
Albumin: 4 g/dL (ref 3.5–5.0)
Alkaline Phosphatase: 56 U/L (ref 38–126)
Anion gap: 8 (ref 5–15)
BUN: 11 mg/dL (ref 6–20)
CO2: 24 mmol/L (ref 22–32)
Calcium: 9 mg/dL (ref 8.9–10.3)
Chloride: 103 mmol/L (ref 98–111)
Creatinine, Ser: 0.59 mg/dL (ref 0.44–1.00)
GFR, Estimated: >60 mL/min (ref >60)
Glucose, Bld: 101 mg/dL — ABNORMAL HIGH (ref 70–99)
Potassium: 4 mmol/L (ref 3.5–5.1)
Sodium: 135 mmol/L (ref 135–145)
Total Bilirubin: 0.6 mg/dL (ref 0.3–1.2)
Total Protein: 7 g/dL (ref 6.5–8.1)

## 2020-04-12 LAB — CBC
HCT: 38.9 % (ref 36.0–46.0)
Hemoglobin: 13.5 g/dL (ref 12.0–15.0)
MCH: 29.8 pg (ref 26.0–34.0)
MCHC: 34.7 g/dL (ref 30.0–36.0)
MCV: 85.9 fL (ref 80.0–100.0)
Platelets: 161 10*3/uL (ref 150–400)
RBC: 4.53 MIL/uL (ref 3.87–5.11)
RDW: 12.3 % (ref 11.5–15.5)
WBC: 3.9 10*3/uL — ABNORMAL LOW (ref 4.0–10.5)
nRBC: 0 % (ref 0.0–0.2)

## 2020-04-12 LAB — URINALYSIS, COMPLETE (UACMP) WITH MICROSCOPIC
Bilirubin Urine: NEGATIVE
Glucose, UA: NEGATIVE mg/dL
Ketones, ur: NEGATIVE mg/dL
Leukocytes,Ua: NEGATIVE
Nitrite: NEGATIVE
Protein, ur: 30 mg/dL — AB
Specific Gravity, Urine: 1.027 (ref 1.005–1.030)
pH: 5 (ref 5.0–8.0)

## 2020-04-12 LAB — URINE DRUG SCREEN, QUALITATIVE (ARMC ONLY)
Amphetamines, Ur Screen: NOT DETECTED
Barbiturates, Ur Screen: NOT DETECTED
Benzodiazepine, Ur Scrn: POSITIVE — AB
Cannabinoid 50 Ng, Ur ~~LOC~~: NOT DETECTED
Cocaine Metabolite,Ur ~~LOC~~: NOT DETECTED
MDMA (Ecstasy)Ur Screen: NOT DETECTED
Methadone Scn, Ur: NOT DETECTED
Opiate, Ur Screen: NOT DETECTED
Phencyclidine (PCP) Ur S: NOT DETECTED
Tricyclic, Ur Screen: NOT DETECTED

## 2020-04-12 LAB — ETHANOL: Alcohol, Ethyl (B): <10 mg/dL (ref <10)

## 2020-04-12 LAB — SALICYLATE LEVEL: Salicylate Lvl: <7 mg/dL — ABNORMAL LOW (ref 7.0–30.0)

## 2020-04-12 LAB — ACETAMINOPHEN LEVEL: Acetaminophen (Tylenol), Serum: <10 ug/mL — ABNORMAL LOW (ref 10–30)

## 2020-04-12 MED ORDER — LEVETIRACETAM 750 MG PO TABS: 2250.0000 mg | ORAL_TABLET | Freq: Every day | ORAL | 0 refills | Status: AC

## 2020-04-12 MED ORDER — LEVETIRACETAM 750 MG PO TABS: 2250.0000 mg | ORAL_TABLET | Freq: Every day | ORAL | 0 refills | Status: DC

## 2020-04-12 NOTE — ED Provider Notes (Signed)
[facility] Emergency Department Provider Note ____________________________________________   First MD Initiated Contact with Patient 04/12/20 1314     (approximate)  I have reviewed the triage vital signs and the nursing notes.  HISTORY  Chief Complaint Psychiatric Evaluation   HPI Lindsey [name] is a 34 y.o. femalewho presents to the ED for evaluation of her mental health.   Chart review indicates hx cerebral palsy, generalized anxiety disorder.  Seizure disorder on Keppra. Patient lives at home and her mother is her caregiver.  Mother presents to the ED tearful with complaints of chronically worsening behavior at home with patient.  She describes a prolonged course since this past summer where patient has been intermittently acting out and behaving "like a toddler."  She describes multiple scenarios where minor inconveniences the patient will cause her to "throw a temper tantrum" where she removes all of her clothes and starts screaming.  Mother reports significant stress at home with caring for a nephew who recently received a kidney transplant and her own father who has recently had strokes and diagnosed with dementia.  Patient is unable to provide any significant history due to her baseline cognitive delay.  Mother denies any concerns for fevers or illnesses.  Denies any recent medication changes.  Patient follows a neurologist at [facility].  Mother reports concern that patient needs to see a psychiatrist, in the outpatient referral was not quick enough.   Past Medical History:  Diagnosis Date  . Epilepsy [facility]) age 89   negative metabolic workup has brain abnormalities by MRI (Dr. Quintin Alto, [facility])  . Insomnia   . Status post VNS (vagus nerve stimulator) placement 2000  . Temporal lobectomy behavior syndrome 1994   Right    Patient Active Problem List   Diagnosis Date Noted  . Urticarial rash 10/30/2019  . Right facial swelling 10/30/2019  . Poor  dentition 09/02/2017  . Constipation 11/10/2015  . Long-term use of high-risk medication 10/11/2015  . Xerosis of skin 12/12/2014  . Restless legs syndrome 07/12/2014  . Restlessness and agitation 09/10/2012  . Routine general medical examination at a health care facility 06/08/2012  . Cerebral palsy (HCC) 05/08/2012  . Acneiform dermatitis 06/30/2011  . Partial epilepsy with intractable epilepsy (HCC) 03/23/2011    Past Surgical History:  Procedure Laterality Date  . ABDOMINAL HYSTERECTOMY  2005  . BRAIN SURGERY     Temporal Lobe resection  . CRANIOTOMY FOR TEMPORAL LOBECTOMY    . TONSILLECTOMY AND ADENOIDECTOMY  age 55    Prior to Admission medications   Medication Sig Start Date End Date Taking? Authorizing Provider  chlorhexidine (PERIDEX) 0.12 % solution 15 mLs 2 (two) times daily. 02/10/20   [provider]  diazepam (DIASTAT ACUDIAL) 10 MG GEL Place 20 mg rectally once.    [provider]  diazepam (VALIUM) 10 MG tablet TAKE ONE TABLET EVERY TWELVE HOURS IF NEEDED FOR ANXIETY 02/08/20   Sherlene Shams, MD  EPINEPHrine 0.3 mg/0.3 mL IJ SOAJ injection Inject 0.3 mLs (0.3 mg total) into the muscle as needed for anaphylaxis. 12/30/19   Sherlene Shams, MD  LAMICTAL 100 MG tablet Take 100 mg by mouth 2 (two) times daily.  08/03/16   [provider]  levETIRAcetam (KEPPRA) 750 MG tablet Take 750 mg by mouth 2 (two) times daily.    [provider]  loratadine (CLARITIN) 10 MG tablet Take 1 tablet (10 mg total) by mouth in the morning and at bedtime. 12/30/19  Sherlene Shams, MD  melatonin 5 MG TABS Take 5 mg by mouth.    [provider]  montelukast (SINGULAIR) 10 MG tablet Take 1 tablet (10 mg total) by mouth at bedtime. 12/30/19   Sherlene Shams, MD  PARoxetine (PAXIL) 10 MG tablet Take 1 tablet (10 mg total) by mouth 2 (two) times daily. 03/22/20   Sherlene Shams, MD  triamcinolone cream (KENALOG) 0.1 % Apply 1 application topically 2  (two) times daily. 10/29/19   Sherlene Shams, MD    Allergies Influenza vac split quad, Benadryl [diphenhydramine hcl], Requip [ropinirole], Singulair [montelukast], Zyrtec [cetirizine hcl], and Peanut-containing drug products  History reviewed. No pertinent family history.  Social History Social History   Tobacco Use  . Smoking status: Never Smoker  . Smokeless tobacco: Never Used  Vaping Use  . Vaping Use: Never used  Substance Use Topics  . Alcohol use: No    Alcohol/week: 0.0 standard drinks  . Drug use: No    Review of Systems  Constitutional: No fever/chills.  Positive for generalized worsening behavior Eyes: No visual changes. ENT: No sore throat. Cardiovascular: Denies chest pain. Respiratory: Denies shortness of breath. Gastrointestinal: No abdominal pain.  No nausea, no vomiting.  No diarrhea.  No constipation. Genitourinary: Negative for dysuria. Musculoskeletal: Negative for back pain. Skin: Negative for rash. Neurological: Negative for headaches, focal weakness or numbness.  ____________________________________________   PHYSICAL EXAM:  VITAL SIGNS: Vitals:   04/12/20 1132 04/12/20 1356  BP: 111/73   Pulse: 76   Resp: 16   Temp: 97.7 F (36.5 C)   SpO2: 100% 100%     Constitutional: Alert and well-appearing. Eyes: Conjunctivae are normal. PERRL. EOMI. Head: Right-sided periorbital bruising without bony step-offs or signs of EOM entrapment. Nose: No congestion/rhinnorhea. Mouth/Throat: Mucous membranes are moist.  Oropharynx non-erythematous. Neck: No stridor. No cervical spine tenderness to palpation. Cardiovascular: Normal rate, regular rhythm. Grossly normal heart sounds.  Good peripheral circulation. Respiratory: Normal respiratory effort.  No retractions. Lungs CTAB. Gastrointestinal: Soft , nondistended, nontender to palpation. No CVA tenderness. Musculoskeletal: No lower extremity tenderness nor edema.  No joint effusions. No signs of  acute trauma. Neurologic:  . No gross focal neurologic deficits are appreciated Skin:  Skin is warm, dry and intact. No rash noted. Psychiatric: Mood and affect are normal. Speech and behavior are normal.  ____________________________________________   LABS (all labs ordered are listed, but only abnormal results are displayed)  Labs Reviewed  COMPREHENSIVE METABOLIC PANEL - Abnormal; Notable for the following components:      Result Value   Glucose, Bld 101 (*)    All other components within normal limits  SALICYLATE LEVEL - Abnormal; Notable for the following components:   Salicylate Lvl <7.0 (*)    All other components within normal limits  ACETAMINOPHEN LEVEL - Abnormal; Notable for the following components:   Acetaminophen (Tylenol), Serum <10 (*)    All other components within normal limits  CBC - Abnormal; Notable for the following components:   WBC 3.9 (*)    All other components within normal limits  URINE DRUG SCREEN, QUALITATIVE ([facility] ONLY) - Abnormal; Notable for the following components:   Benzodiazepine, Ur Scrn POSITIVE (*)    All other components within normal limits  URINALYSIS, COMPLETE (UACMP) WITH MICROSCOPIC - Abnormal; Notable for the following components:   Color, Urine YELLOW (*)    APPearance HAZY (*)    Hgb urine dipstick LARGE (*)    Protein, ur 30 (*)  Bacteria, UA FEW (*)    All other components within normal limits  ETHANOL   ____________________________________________  RADIOLOGY  ED MD interpretation: CT head reviewed by me without evidence of acute renal pathology.  Evidence of old resection to the right temporal lobe.  Official radiology report(s): CT Head Wo Contrast  Result Date: 04/12/2020 CLINICAL DATA:  Head trauma. EXAM: CT HEAD WITHOUT CONTRAST TECHNIQUE: Contiguous axial images were obtained from the base of the skull through the vertex without intravenous contrast. COMPARISON:  CT head 915, 2018. FINDINGS: Brain: No evidence of  acute infarction, hemorrhage, hydrocephalus, extra-axial collection or mass lesion/mass effect. Sequela of prior resection of a large portion of the right temporal lobe. Vascular: No hyperdense vessel identified. Skull: Prior right temporal craniotomy. Sinuses/Orbits: Visualized sinuses are clear.  Unremarkable orbits. Other: No mastoid effusions. IMPRESSION: 1. No evidence of acute intracranial abnormality. 2. Similar sequela of partial right temporal lobe resection. Electronically Signed   By: Feliberto Harts MD   On: 04/12/2020 14:51   ____________________________________________   PROCEDURES and INTERVENTIONS  Procedure(s) performed (including Critical Care):  Procedures  Medications - No data to display  ____________________________________________   MDM / ED COURSE   34 year old patient cerebral palsy is brought to the ED with her mother due to concerns for chronic behavior concerns at home, amenable to psychiatric evaluation and likely outpatient management.  Normal vitals on room air.  Exam demonstrates some mild bruising to her right periorbital area, and neither patient nor mother cannot recall any discrete trauma.  Patient has no evidence of neurovascular deficits and she is pleasant for me during my evaluation.  She is in no distress and has no further evidence of trauma beyond the bruising around her right eye.  No evidence of EOM entrapment.  Blood work and urine testing without evidence of systemic illness as the source of her altered mentation and behavior.  CT imaging of the head without evidence of ICH or mass to cause her altered behavior.  We will have psychiatry evaluate the patient to offer any additional recommendations.  I anticipate outpatient management thereafter.     ____________________________________________   FINAL CLINICAL IMPRESSION(S) / ED DIAGNOSES  Final diagnoses:  Behavior concern in adult     ED Discharge Orders    None       Nikya Busler   Note:  This document was prepared using Dragon voice recognition software and may include unintentional dictation errors.   Delton Prairie, MD 04/12/20 1459

## 2020-04-12 NOTE — ED Notes (Signed)
Assumed care of patient as per parent, patient has not been acting like her usual self since Thursday. Reports she has been very short tempered and acting out, when parent does not respond to her request right away she takes her clothes off and hits hit. This behavior is new and parent is concerned something else may be wrong with her. Patient is a poor historian can only answer yes and no questions, unable to have full conversation. Denied pain when asked.

## 2020-04-12 NOTE — ED Triage Notes (Signed)
Patient brought in by mom who is her caregiver at home. Mom tearful in triage. Mom reports since Thursday patient has been having episodes when she doesn't get her way right away of " taking all her clothes off, yelling, hitting cabinets, herself and caregivers." Patient has old bruise to right eye. Mom reports PCP is in process of getting appointment set up to see psychiatrist to get the process started of possible psych meds.

## 2020-04-12 NOTE — Discharge Instructions (Addendum)
Follow up with Neurology.  Please increase your Keppra dose as directed.

## 2020-04-12 NOTE — ED Provider Notes (Signed)
Patient has been evaluated by Dr. Toni Amend.  In discussion with the patient's neurologist, they have recommended increasing her keppra to 1.5 pils BID.  To follow up with clinic next week.   Stable and appropriate for DC home.   Willy Eddy, MD 04/12/20 [phone]
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
## 2020-05-04 ENCOUNTER — Emergency Department
Admission: EM | Admit: 2020-05-04 | Discharge: 2020-05-04 | Disposition: A | Discharge status: Home / Self Care | Payer: 59 | Attending: Emergency Medicine | Admitting: Emergency Medicine

## 2020-05-04 ENCOUNTER — Other Ambulatory Visit: Payer: Self-pay

## 2020-05-04 ENCOUNTER — Telehealth: Payer: Self-pay

## 2020-05-04 ENCOUNTER — Ambulatory Visit: Payer: Medicaid Other | Admitting: Internal Medicine

## 2020-05-04 DIAGNOSIS — R4689 Other symptoms and signs involving appearance and behavior: Secondary | ICD-10-CM | POA: Diagnosis not present

## 2020-05-04 DIAGNOSIS — Z79899 Other long term (current) drug therapy: Secondary | ICD-10-CM | POA: Diagnosis not present

## 2020-05-04 DIAGNOSIS — R456 Violent behavior: Secondary | ICD-10-CM | POA: Insufficient documentation

## 2020-05-04 DIAGNOSIS — F71 Moderate intellectual disabilities: Secondary | ICD-10-CM | POA: Diagnosis not present

## 2020-05-04 DIAGNOSIS — Z91010 Allergy to peanuts: Secondary | ICD-10-CM | POA: Diagnosis not present

## 2020-05-04 DIAGNOSIS — F72 Severe intellectual disabilities: Secondary | ICD-10-CM | POA: Insufficient documentation

## 2020-05-04 LAB — CBC
HCT: 39.4 % (ref 36.0–46.0)
Hemoglobin: 13.5 g/dL (ref 12.0–15.0)
MCH: 29.6 pg (ref 26.0–34.0)
MCHC: 34.3 g/dL (ref 30.0–36.0)
MCV: 86.4 fL (ref 80.0–100.0)
Platelets: 185 10*3/uL (ref 150–400)
RBC: 4.56 MIL/uL (ref 3.87–5.11)
RDW: 12.1 % (ref 11.5–15.5)
WBC: 3.7 10*3/uL — ABNORMAL LOW (ref 4.0–10.5)
nRBC: 0 % (ref 0.0–0.2)

## 2020-05-04 LAB — COMPREHENSIVE METABOLIC PANEL
ALT: 11 U/L (ref 0–44)
AST: 18 U/L (ref 15–41)
Albumin: 4.2 g/dL (ref 3.5–5.0)
Alkaline Phosphatase: 49 U/L (ref 38–126)
Anion gap: 8 (ref 5–15)
BUN: 10 mg/dL (ref 6–20)
CO2: 24 mmol/L (ref 22–32)
Calcium: 9 mg/dL (ref 8.9–10.3)
Chloride: 104 mmol/L (ref 98–111)
Creatinine, Ser: 0.53 mg/dL (ref 0.44–1.00)
GFR, Estimated: >60 mL/min (ref >60)
Glucose, Bld: 114 mg/dL — ABNORMAL HIGH (ref 70–99)
Potassium: 3.5 mmol/L (ref 3.5–5.1)
Sodium: 136 mmol/L (ref 135–145)
Total Bilirubin: 0.6 mg/dL (ref 0.3–1.2)
Total Protein: 7.3 g/dL (ref 6.5–8.1)

## 2020-05-04 LAB — ETHANOL: Alcohol, Ethyl (B): <10 mg/dL (ref <10)

## 2020-05-04 LAB — SALICYLATE LEVEL: Salicylate Lvl: <7 mg/dL — ABNORMAL LOW (ref 7.0–30.0)

## 2020-05-04 LAB — ACETAMINOPHEN LEVEL: Acetaminophen (Tylenol), Serum: <10 ug/mL — ABNORMAL LOW (ref 10–30)

## 2020-05-04 MED ORDER — OLANZAPINE 5 MG PO TBDP: 5.0000 mg | ORAL_TABLET | Freq: Two times a day (BID) | ORAL | 1 refills | Status: DC

## 2020-05-04 NOTE — Telephone Encounter (Signed)
LM for pt's mother to give [place] a call back.

## 2020-05-04 NOTE — Telephone Encounter (Signed)
Spoke with pt's mother and scheduled pt for an ED follow up for tomorrow at 1:30pm. Mother stated that they had to cancel pt's appt this morning because pt was having behavorial issues and had to be taken to the ED. Mother stated that they prescribed her a new medication to see if that helps. Mother also wanted to bring to attention the fact the pt's WBC keeps dropping and she is wanting to talk with you about that tomorrow and what could be causing that.

## 2020-05-04 NOTE — Telephone Encounter (Signed)
Pt's mother returned your call from previous message
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
## 2020-05-04 NOTE — ED Provider Notes (Signed)
[facility] Emergency Department Provider Note   ____________________________________________    I have reviewed the triage vital signs and the nursing notes.   HISTORY  Chief Complaint Aggressive Behavior     HPI Lindsey [name] is a 34 y.o. female with history of intellectual disability who was brought in by mother because of aggressive behavior.  Mother reports the patient has been becoming very upset if she does not get her way recently.  Was seen in the emergency department several weeks ago for similar complaint.  Mother thinks this is a behavioral issue and that she needs psychiatric medication.  Patient is quiet and behaving appropriately here   Past Medical History:  Diagnosis Date  . Epilepsy [facility]) age 89   negative metabolic workup has brain abnormalities by MRI (Dr. Quintin Alto, [facility])  . Insomnia   . Status post VNS (vagus nerve stimulator) placement 2000  . Temporal lobectomy behavior syndrome 1994   Right    Patient Active Problem List   Diagnosis Date Noted  . Change in behavior 05/04/2020  . Moderate intellectual disability 05/04/2020  . Urticarial rash 10/30/2019  . Right facial swelling 10/30/2019  . Poor dentition 09/02/2017  . Constipation 11/10/2015  . Long-term use of high-risk medication 10/11/2015  . Xerosis of skin 12/12/2014  . Restless legs syndrome 07/12/2014  . Restlessness and agitation 09/10/2012  . Routine general medical examination at a health care facility 06/08/2012  . Cerebral palsy (HCC) 05/08/2012  . Acneiform dermatitis 06/30/2011  . Partial epilepsy with intractable epilepsy (HCC) 03/23/2011    Past Surgical History:  Procedure Laterality Date  . ABDOMINAL HYSTERECTOMY  2005  . BRAIN SURGERY     Temporal Lobe resection  . CRANIOTOMY FOR TEMPORAL LOBECTOMY    . TONSILLECTOMY AND ADENOIDECTOMY  age 23    Prior to Admission medications   Medication Sig Start Date End Date Taking? Authorizing  Provider  OLANZapine zydis (ZYPREXA ZYDIS) 5 MG disintegrating tablet Take 1 tablet (5 mg total) by mouth 2 (two) times daily. 05/04/20  Yes Clapacs, Jackquline Denmark, MD  chlorhexidine (PERIDEX) 0.12 % solution 15 mLs 2 (two) times daily. 02/10/20   [provider]  diazepam (DIASTAT ACUDIAL) 10 MG GEL Place 20 mg rectally once.    [provider]  diazepam (VALIUM) 10 MG tablet TAKE ONE TABLET EVERY TWELVE HOURS IF NEEDED FOR ANXIETY 02/08/20   Sherlene Shams, MD  EPINEPHrine 0.3 mg/0.3 mL IJ SOAJ injection Inject 0.3 mLs (0.3 mg total) into the muscle as needed for anaphylaxis. 12/30/19   Sherlene Shams, MD  LAMICTAL 100 MG tablet Take 100 mg by mouth 2 (two) times daily.  08/03/16   [provider]  levETIRAcetam (KEPPRA) 750 MG tablet Take 3 tablets (2,250 mg total) by mouth daily. TAKE 1 and a half pills twice a day 04/12/20   Clapacs, Jackquline Denmark, MD  loratadine (CLARITIN) 10 MG tablet Take 1 tablet (10 mg total) by mouth in the morning and at bedtime. 12/30/19   Sherlene Shams, MD  melatonin 5 MG TABS Take 5 mg by mouth.    [provider]  montelukast (SINGULAIR) 10 MG tablet Take 1 tablet (10 mg total) by mouth at bedtime. 12/30/19   Sherlene Shams, MD  PARoxetine (PAXIL) 10 MG tablet Take 1 tablet (10 mg total) by mouth 2 (two) times daily. 03/22/20   Sherlene Shams, MD  triamcinolone cream (KENALOG) 0.1 % Apply 1 application topically 2 (  two) times daily. 10/29/19   Sherlene Shams, MD     Allergies Influenza vac split quad, Benadryl [diphenhydramine hcl], Requip [ropinirole], Singulair [montelukast], Zyrtec [cetirizine hcl], and Peanut-containing drug products  No family history on file.  Social History Social History   Tobacco Use  . Smoking status: Never Smoker  . Smokeless tobacco: Never Used  Vaping Use  . Vaping Use: Never used  Substance Use Topics  . Alcohol use: No    Alcohol/week: 0.0 standard drinks  . Drug use: No    Review of Systems  limited by intellectual disability  Constitutional: No reports of fevers   Respiratory: No cough Gastrointestinal: No vomiting      ____________________________________________   PHYSICAL EXAM:  VITAL SIGNS: ED Triage Vitals  Enc Vitals Group     BP 05/04/20 0927 109/78     Pulse Rate 05/04/20 0927 91     Resp 05/04/20 0927 16     Temp 05/04/20 0927 98.4 F (36.9 C)     Temp Source 05/04/20 0927 Oral     SpO2 05/04/20 0927 98 %     Weight 05/04/20 0928 56.2 kg (124 lb)     Height 05/04/20 0928 1.676 m (5\' 6" )     Head Circumference --      Peak Flow --      Pain Score 05/04/20 0928 0     Pain Loc --      Pain Edu? --      Excl. in GC? --     Constitutional: Alert, quiet and calm Eyes: Conjunctivae are normal.  Head: Atraumatic. Nose: No congestion/rhinnorhea.  Cardiovascular: Normal rate, regular rhythm.   Good peripheral circulation. Respiratory: Normal respiratory effort.  No retractions.   Musculoskeletal: Warm and well perfused Neurologic:  Normal speech and language. No gross focal neurologic deficits are appreciated.  Skin:  Skin is warm, dry and intact. No rash noted. Psychiatric: Mood and affect are normal. Speech and behavior are normal.  ____________________________________________   LABS (all labs ordered are listed, but only abnormal results are displayed)  Labs Reviewed  COMPREHENSIVE METABOLIC PANEL - Abnormal; Notable for the following components:      Result Value   Glucose, Bld 114 (*)    All other components within normal limits  SALICYLATE LEVEL - Abnormal; Notable for the following components:   Salicylate Lvl <7.0 (*)    All other components within normal limits  ACETAMINOPHEN LEVEL - Abnormal; Notable for the following components:   Acetaminophen (Tylenol), Serum <10 (*)    All other components within normal limits  CBC - Abnormal; Notable for the following components:   WBC 3.7 (*)    All other components within normal limits   ETHANOL  URINE DRUG SCREEN, QUALITATIVE ([facility] ONLY)   ____________________________________________  EKG   ____________________________________________  RADIOLOGY   ____________________________________________   PROCEDURES  Procedure(s) performed: No  Procedures   Critical Care performed: No ____________________________________________   INITIAL IMPRESSION / ASSESSMENT AND PLAN / ED COURSE  Pertinent labs & imaging results that were available during my care of the patient were reviewed by me and considered in my medical decision making (see chart for details).  Patient calm and behaving appropriately here.  Lab work is unremarkable, medically cleared for psychiatric evaluation.  Appreciate Dr. 05/06/20 consultation, he will start the patient on Zyprexa outpatient follow-up recommended.    ____________________________________________   FINAL CLINICAL IMPRESSION(S) / ED DIAGNOSES  Final diagnoses:  Aggressive behavior  Note:  This document was prepared using Dragon voice recognition software and may include unintentional dictation errors.   Jene Every, MD 05/04/20 [phone]

## 2020-05-04 NOTE — ED Notes (Signed)
Patient assisted into personal clothes by mother.

## 2020-05-04 NOTE — ED Notes (Signed)
Pt dressed into burgundy scrubs by this tech and Eman, EDT. Pt belongings include: pair of tennis shoes, pair of socks, jean pants, green quarter sleeve shirt, bra, gray jacket, and underwear. Pt belongings properly labeled and put into one belongings bag.

## 2020-05-04 NOTE — Telephone Encounter (Signed)
Pt's mom called and said that she has been discharged from the hospital and they are starting her on OLANZapine zydis (ZYPREXA ZYDIS) 5 MG disintegrating  She would like a call back to discuss what happened today and wanting to get a follow up appt scheduled soon on a Thursday

## 2020-05-05 ENCOUNTER — Encounter: Payer: Self-pay | Admitting: Internal Medicine

## 2020-05-05 ENCOUNTER — Other Ambulatory Visit: Payer: Self-pay

## 2020-05-05 ENCOUNTER — Ambulatory Visit (INDEPENDENT_AMBULATORY_CARE_PROVIDER_SITE_OTHER): Payer: 59 | Admitting: Internal Medicine

## 2020-05-05 VITALS — BP 100/60 | HR 83 | Temp 97.5°F | Resp 16 | Ht 66.0 in | Wt 124.0 lb

## 2020-05-05 DIAGNOSIS — D709 Neutropenia, unspecified: Secondary | ICD-10-CM | POA: Diagnosis not present

## 2020-05-05 DIAGNOSIS — R4689 Other symptoms and signs involving appearance and behavior: Secondary | ICD-10-CM | POA: Diagnosis not present

## 2020-05-05 MED ORDER — ALPRAZOLAM 0.25 MG PO TABS: 0.2500 mg | ORAL_TABLET | Freq: Two times a day (BID) | ORAL | 0 refills | Status: DC | PRN

## 2020-05-05 MED ORDER — PAROXETINE HCL 10 MG PO TABS
20.0000 mg | ORAL_TABLET | Freq: Two times a day (BID) | ORAL | 1 refills | Status: DC
Start: 2020-05-05 — End: 2020-05-10

## 2020-05-05 NOTE — Patient Instructions (Addendum)
Reduce olanzapine to 2.5 mg once daily for the first 3-4 days,  Then add 2nd dose if needed   Use alprazolam in the interim for any tantrums   Increase the paroxetine (paxil) to 20 mg in the morning for one week,  Then if tolerating this   You can increase evening dose to 20 mg  For total daily dose of 40 mg

## 2020-05-07 NOTE — Progress Notes (Signed)
Subjective:  Patient ID: Lindsey [name], female    DOB: [birth year]  Age: 35 y.o. MRN: [id number]  CC: The primary encounter diagnosis was Neutropenia, unspecified type (HCC). A diagnosis of Change in behavior was also pertinent to this visit.  HPI Lindsey [name] presents for follow up on 2nd ER visit for agitation and uncontrollable behavior.  Accompanied by her mother , who is chief caregiver and guardian and provides 100% of history as Lindsey [name] is intellectually impaired secondary to cerebral palsy with seizure disorder.   This visit occurred during the SARS-CoV-2 public health emergency.  Safety protocols were in place, including screening questions prior to the visit, additional usage of staff PPE, and extensive cleaning of exam room while observing appropriate contact time as indicated for disinfecting solutions.   Lindsey [name] is currently calm and seated but was uncooperative with staff.   Recent events leading to ER visit reviewed .  ER record reviewed.  She continues to have temper tantrums at home that culminate in her removing all of her clothes and being defiant.  She is not physically violent.  Mother and I discussed the possibility of patient being jealous of her cousin,  who is a toddler and is in the home for several days per week for daycare.  The toddler is clearly  doted on by Lindsey [name] (patient's mother).    Patient was seen by Dr Toni Amend (psychiatrist on call,  called by ER physician) and olanzapine 5 mg bid prescribed.  Patient's mother gave her one dose which made patient weak and lethargic. She is also tolerating Paxil 10 mg which I started over a month ago.  Dose has not been titrated.  Neurology has not seen her since the behavioral changes began,  But Keppra dose was increased with no significant improvement in her outbursts.  She has not seen a child psychiatrist.   Outpatient Medications Prior to Visit  Medication Sig Dispense Refill  . chlorhexidine (PERIDEX) 0.12 %  solution 15 mLs 2 (two) times daily.    . diazepam (DIASTAT ACUDIAL) 10 MG GEL Place 20 mg rectally once.    . diazepam (VALIUM) 10 MG tablet TAKE ONE TABLET EVERY TWELVE HOURS IF NEEDED FOR ANXIETY 30 tablet 5  . EPINEPHrine 0.3 mg/0.3 mL IJ SOAJ injection Inject 0.3 mLs (0.3 mg total) into the muscle as needed for anaphylaxis. 1 each 1  . LAMICTAL 100 MG tablet Take 100 mg by mouth 2 (two) times daily.     Marland Kitchen levETIRAcetam (KEPPRA) 750 MG tablet Take 3 tablets (2,250 mg total) by mouth daily. TAKE 1 and a half pills twice a day 90 tablet 0  . loratadine (CLARITIN) 10 MG tablet Take 1 tablet (10 mg total) by mouth in the morning and at bedtime. 60 tablet 11  . melatonin 5 MG TABS Take 5 mg by mouth.    . OLANZapine zydis (ZYPREXA ZYDIS) 5 MG disintegrating tablet Take 1 tablet (5 mg total) by mouth 2 (two) times daily. 60 tablet 1  . triamcinolone cream (KENALOG) 0.1 % Apply 1 application topically 2 (two) times daily. 80 g 1  . PARoxetine (PAXIL) 10 MG tablet Take 1 tablet (10 mg total) by mouth 2 (two) times daily. 180 tablet 1  . montelukast (SINGULAIR) 10 MG tablet Take 1 tablet (10 mg total) by mouth at bedtime. (Patient not taking: Reported on 05/05/2020) 30 tablet 3   No facility-administered medications prior to visit.    Review of Systems;  Patient is unable  to provide meaninful information but per mother there as been no signs of  headache, fevers, malaise, unintentional weight loss, skin rash, eye pain, sinus congestion and sinus pain, sore throat, dysphagia,  hemoptysis , cough, dyspnea, wheezing, chest pain, palpitations, orthopnea, edema, abdominal pain, nausea, melena, diarrhea, constipation, flank pain, dysuria, hematuria, urinary  Frequency, nocturia, numbness, tingling,    Loss of consciousness,  Tremor, insomnia, depression, or  suicidal ideation.      Objective:  BP 100/60 (BP Location: Right Arm, Patient Position: Sitting, Cuff Size: Normal)   Pulse 83   Temp (!) 97.5 F  (36.4 C) (Oral)   Resp 16   Ht 5\' 6"  (1.676 m)   Wt 124 lb (56.2 kg)   LMP 01/09/2011   SpO2 98%   BMI 20.01 kg/m   BP Readings from Last 3 Encounters:  05/05/20 100/60  05/04/20 110/80  04/12/20 120/66    Wt Readings from Last 3 Encounters:  05/05/20 124 lb (56.2 kg)  05/04/20 124 lb (56.2 kg)  04/12/20 124 lb (56.2 kg)    General appearance: alert, does not follow commands.  Appears sullen and older than stated age Ears: normal TM's and external ear canals both ears Throat: lips, mucosa, and tongue normal; teeth and gums normal Neck: no adenopathy, no carotid bruit, supple, symmetrical, trachea midline and thyroid not enlarged, symmetric, no tenderness/mass/nodules Back: symmetric, no curvature. ROM normal. No CVA tenderness. Lungs: clear to auscultation bilaterally Heart: regular rate and rhythm, S1, S2 normal, no murmur, click, rub or gallop Abdomen: soft, non-tender; bowel sounds normal; no masses,  no organomegaly Pulses: 2+ and symmetric Skin: Skin color, texture, turgor normal. No rashes or lesions Lymph nodes: Cervical, supraclavicular, and axillary nodes normal.  No results found for: HGBA1C  Lab Results  Component Value Date   CREATININE 0.53 05/04/2020   CREATININE 0.59 04/12/2020   CREATININE 0.68 08/24/2019    Lab Results  Component Value Date   WBC 3.7 (L) 05/04/2020   HGB 13.5 05/04/2020   HCT 39.4 05/04/2020   PLT 185 05/04/2020   GLUCOSE 114 (H) 05/04/2020   CHOL 113 02/12/2019   TRIG 59.0 02/12/2019   HDL 36.60 (L) 02/12/2019   LDLCALC 65 02/12/2019   ALT 11 05/04/2020   AST 18 05/04/2020   NA 136 05/04/2020   K 3.5 05/04/2020   CL 104 05/04/2020   CREATININE 0.53 05/04/2020   BUN 10 05/04/2020   CO2 24 05/04/2020   TSH 1.84 08/24/2019    No results found.  Assessment & Plan:   Problem List Items Addressed This Visit      Unprioritized   Change in behavior    Etiology unclear.  Infection ruled out.  Suspect jealousy of  toddler in home receiving showers of affection from mother,  Creating increased  anxiety . Mother has agreed to increase dose of Paroxetine to 20 mg daily and resume olanzapine at 2.5 mg once daiily in the morning.  Strongly advised to contact her neurologist to inquire about psychiatry referral to someone experienced with adult patients with intellectual diabilities.          Other Visit Diagnoses    Neutropenia, unspecified type ([place])    -  Primary   Relevant Orders   CBC with Differential/Platelet      I have discontinued Lindsey [name]'s montelukast. I have also changed her PARoxetine. Additionally, I am having her start on ALPRAZolam. Lastly, I am having her maintain her diazepam, LaMICtal, melatonin, triamcinolone, EPINEPHrine,  loratadine, diazepam, chlorhexidine, levETIRAcetam, and OLANZapine zydis.  Meds ordered this encounter  Medications  . ALPRAZolam (XANAX) 0.25 MG tablet    Sig: Take 1 tablet (0.25 mg total) by mouth 2 (two) times daily as needed for anxiety.    Dispense:  20 tablet    Refill:  0  . PARoxetine (PAXIL) 10 MG tablet    Sig: Take 2 tablets (20 mg total) by mouth 2 (two) times daily.    Dispense:  180 tablet    Refill:  1    KEEP ON FILE FOR FUTURE REFILLS    Medications Discontinued During This Encounter  Medication Reason  . montelukast (SINGULAIR) 10 MG tablet   . PARoxetine (PAXIL) 10 MG tablet    I provided  30 minutes of  face-to-face time during this encounter reviewing patient's current problems and past surgeries, labs and imaging studies, providing counseling on the above mentioned problems , and coordination  of care .  Follow-up: Return in about 3 weeks (around 05/26/2020).   Sherlene Shams, MD

## 2020-05-07 NOTE — Assessment & Plan Note (Signed)
Etiology unclear.  Infection ruled out.  Suspect jealousy of toddler in home receiving showers of affection from mother,  Creating increased  anxiety . Mother has agreed to increase dose of Paroxetine to 20 mg daily and resume olanzapine at 2.5 mg once daiily in the morning.  Strongly advised to contact her neurologist to inquire about psychiatry referral to someone experienced with adult patients with intellectual diabilities.

## 2020-05-10 ENCOUNTER — Other Ambulatory Visit: Payer: Self-pay | Admitting: Internal Medicine

## 2020-05-10 MED ORDER — PAROXETINE HCL 20 MG PO TABS
20.0000 mg | ORAL_TABLET | Freq: Two times a day (BID) | ORAL | 2 refills | Status: DC
Start: 2020-05-10 — End: 2021-01-06

## 2020-05-16 DIAGNOSIS — Z79899 Other long term (current) drug therapy: Secondary | ICD-10-CM

## 2020-06-02 ENCOUNTER — Other Ambulatory Visit: Payer: Self-pay | Admitting: Internal Medicine

## 2020-06-02 NOTE — Telephone Encounter (Signed)
RX Refill:xanax Last Seen:05-05-20 Last ordered:05-05-20

## 2020-06-06 ENCOUNTER — Other Ambulatory Visit: Payer: Medicaid Other

## 2020-06-09 ENCOUNTER — Ambulatory Visit: Payer: Medicaid Other | Admitting: Internal Medicine

## 2020-06-21 ENCOUNTER — Telehealth: Payer: Self-pay

## 2020-06-21 ENCOUNTER — Other Ambulatory Visit: Payer: Self-pay

## 2020-06-21 ENCOUNTER — Other Ambulatory Visit (INDEPENDENT_AMBULATORY_CARE_PROVIDER_SITE_OTHER): Payer: 59

## 2020-06-21 DIAGNOSIS — Z79899 Other long term (current) drug therapy: Secondary | ICD-10-CM | POA: Diagnosis not present

## 2020-06-21 DIAGNOSIS — D709 Neutropenia, unspecified: Secondary | ICD-10-CM

## 2020-06-21 LAB — COMPREHENSIVE METABOLIC PANEL
ALT: 14 U/L (ref 0–35)
AST: 17 U/L (ref 0–37)
Albumin: 4.3 g/dL (ref 3.5–5.2)
Alkaline Phosphatase: 68 U/L (ref 39–117)
BUN: 10 mg/dL (ref 6–23)
CO2: 24 mEq/L (ref 19–32)
Calcium: 9.3 mg/dL (ref 8.4–10.5)
Chloride: 103 mEq/L (ref 96–112)
Creatinine, Ser: 0.58 mg/dL (ref 0.40–1.20)
GFR: 119.34 mL/min (ref >60.00)
Glucose, Bld: 75 mg/dL (ref 70–99)
Potassium: 4.1 mEq/L (ref 3.5–5.1)
Sodium: 139 mEq/L (ref 135–145)
Total Bilirubin: 0.4 mg/dL (ref 0.2–1.2)
Total Protein: 7 g/dL (ref 6.0–8.3)

## 2020-06-21 NOTE — Addendum Note (Signed)
Addended by: Warden Fillers on: 06/21/2020 03:08 PM   Modules accepted: Orders

## 2020-06-21 NOTE — Telephone Encounter (Signed)
Pt's mother is aware and stated that they would have the cbc drawn at pt appt next week.

## 2020-06-21 NOTE — Addendum Note (Signed)
Addended by: Warden Fillers on: 06/21/2020 10:32 AM   Modules accepted: Orders

## 2020-06-21 NOTE — Telephone Encounter (Signed)
LMTCB

## 2020-06-21 NOTE — Telephone Encounter (Signed)
Lab has been added per pt's mother's request.

## 2020-06-21 NOTE — Addendum Note (Signed)
Addended by: Warden Fillers on: 06/21/2020 10:34 AM   Modules accepted: Orders

## 2020-06-21 NOTE — Telephone Encounter (Signed)
CMET has been resulted & CBC can not be added per lab.

## 2020-06-24 LAB — LAMOTRIGINE LEVEL: Lamotrigine Lvl: 7.2 ug/mL (ref 4.0–18.0)

## 2020-06-24 LAB — LEVETIRACETAM LEVEL: Keppra (Levetiracetam): 25.9 ug/mL (ref 12.0–46.0)

## 2020-06-28 ENCOUNTER — Ambulatory Visit (INDEPENDENT_AMBULATORY_CARE_PROVIDER_SITE_OTHER): Payer: 59 | Admitting: Internal Medicine

## 2020-06-28 ENCOUNTER — Other Ambulatory Visit: Payer: Self-pay

## 2020-06-28 ENCOUNTER — Encounter: Payer: Self-pay | Admitting: Internal Medicine

## 2020-06-28 VITALS — BP 112/84 | HR 108 | Temp 96.7°F | Ht 65.98 in | Wt 153.4 lb

## 2020-06-28 DIAGNOSIS — D72819 Decreased white blood cell count, unspecified: Secondary | ICD-10-CM

## 2020-06-28 DIAGNOSIS — E038 Other specified hypothyroidism: Secondary | ICD-10-CM | POA: Diagnosis not present

## 2020-06-28 DIAGNOSIS — D702 Other drug-induced agranulocytosis: Secondary | ICD-10-CM | POA: Diagnosis not present

## 2020-06-28 DIAGNOSIS — R451 Restlessness and agitation: Secondary | ICD-10-CM

## 2020-06-28 NOTE — Assessment & Plan Note (Addendum)
Mild improvement with addition of paxil .  Continue prn alprazolam

## 2020-06-28 NOTE — Progress Notes (Addendum)
Subjective:  Patient ID: Lindsey [name], female    DOB: February 24, 1986  Age: 35 y.o. MRN: [id number]  CC: The primary encounter diagnosis was Leukopenia, unspecified type. Diagnoses of TSH (thyroid-stimulating hormone deficiency), Restlessness and agitation, and Drug-induced leukopenia (HCC) were also pertinent to this visit.  HPI Lindsey [name] presents for  FOLLOW UP On new onset agitation and behavioral disturbance .    This visit occurred during the SARS-CoV-2 public health emergency.  Safety protocols were in place, including screening questions prior to the visit, additional usage of staff PPE, and extensive cleaning of exam room while observing appropriate contact time as indicated for disinfecting solutions.    Patient has received three doses of the available COVID 19 vaccine without complications.  Patient refuses to  mask but does not leave the  home except when with her mother .     She is a 35 yr old female with cerebral palsy and seizure disorder with limited conversational skills who is accompanied by her mother.  Her mother notes that the tantrums have improved this month and have been controlled with alprazolam prn.  Her mother notes that tThe episodes are often triggered by  Interactions with multiple family members that resulting in chaotic milieu due to members being hard of hearing and argumentative.   Patient has had intermittent episodes of  hand tremors that are often aggravated by some of her medications   Mother wants to stop the loratidine   Mother wants a note of exemption so Lindsey [name] doesn't have to wear a mask in the office.  She is fully vaccinated and does not leave the house except with mother.     Outpatient Medications Prior to Visit  Medication Sig Dispense Refill  . ALPRAZolam (XANAX) 0.25 MG tablet TAKE 1 TABLET BY MOUTH 2 TIMES DAILY AS NEEDED FOR ANXIETY. 45 tablet 3  . chlorhexidine (PERIDEX) 0.12 % solution 15 mLs 2 (two) times daily.    .  diazepam (DIASTAT ACUDIAL) 10 MG GEL Place 20 mg rectally once.    . diazepam (VALIUM) 10 MG tablet TAKE ONE TABLET EVERY TWELVE HOURS IF NEEDED FOR ANXIETY 30 tablet 5  . EPINEPHrine 0.3 mg/0.3 mL IJ SOAJ injection Inject 0.3 mLs (0.3 mg total) into the muscle as needed for anaphylaxis. 1 each 1  . LAMICTAL 100 MG tablet Take 100 mg by mouth 2 (two) times daily.     Marland Kitchen levETIRAcetam (KEPPRA) 750 MG tablet Take 3 tablets (2,250 mg total) by mouth daily. TAKE 1 and a half pills twice a day 90 tablet 0  . melatonin 5 MG TABS Take 5 mg by mouth.    . OLANZapine zydis (ZYPREXA ZYDIS) 5 MG disintegrating tablet Take 1 tablet (5 mg total) by mouth 2 (two) times daily. 60 tablet 1  . PARoxetine (PAXIL) 20 MG tablet Take 1 tablet (20 mg total) by mouth 2 (two) times daily. 180 tablet 2  . triamcinolone cream (KENALOG) 0.1 % Apply 1 application topically 2 (two) times daily. 80 g 1  . loratadine (CLARITIN) 10 MG tablet Take 1 tablet (10 mg total) by mouth in the morning and at bedtime. (Patient not taking: Reported on 06/28/2020) 60 tablet 11   No facility-administered medications prior to visit.    Review of Systems;  Patient denies headache, fevers, malaise, unintentional weight loss, skin rash, eye pain, sinus congestion and sinus pain, sore throat, dysphagia,  hemoptysis , cough, dyspnea, wheezing, chest pain, palpitations, orthopnea, edema, abdominal pain, nausea,  melena, diarrhea, constipation, flank pain, dysuria, hematuria, urinary  Frequency, nocturia, numbness, tingling, seizures,  Focal weakness, Loss of consciousness,  Tremor, insomnia, depression, anxiety, and suicidal ideation.      Objective:  BP 112/84 (BP Location: Left Arm, Patient Position: Sitting)   Pulse (!) 108   Temp (!) 96.7 F (35.9 C)   Ht 5' 5.98" (1.676 m)   Wt 153 lb 6.4 oz (69.6 kg)   LMP 01/09/2011   SpO2 97%   BMI 24.77 kg/m   BP Readings from Last 3 Encounters:  06/28/20 112/84  05/05/20 100/60  05/04/20  110/80    Wt Readings from Last 3 Encounters:  06/28/20 153 lb 6.4 oz (69.6 kg)  05/05/20 124 lb (56.2 kg)  05/04/20 124 lb (56.2 kg)    General appearance: alert, intermittently cooperative and appears younger than stated age Ears: normal TM's and external ear canals both ears Throat: lips, mucosa, and tongue normal; teeth and gums normal Neck: no adenopathy, no carotid bruit, supple, symmetrical, trachea midline and thyroid not enlarged, symmetric, no tenderness/mass/nodules Back: symmetric, no curvature. ROM normal. No CVA tenderness. Lungs: clear to auscultation bilaterally Heart: regular rate and rhythm, S1, S2 normal, no murmur, click, rub or gallop Abdomen: soft, non-tender; bowel sounds normal; no masses,  no organomegaly Pulses: 2+ and symmetric Skin: Skin color, texture, turgor normal. No rashes or lesions Lymph nodes: Cervical, supraclavicular, and axillary nodes normal.   No results found for: HGBA1C  Lab Results  Component Value Date   CREATININE 0.58 06/21/2020   CREATININE 0.53 05/04/2020   CREATININE 0.59 04/12/2020    Lab Results  Component Value Date   WBC 3.3 (L) 06/28/2020   HGB 13.3 06/28/2020   HCT 37.7 06/28/2020   PLT 182.0 06/28/2020   GLUCOSE 75 06/21/2020   CHOL 113 02/12/2019   TRIG 59.0 02/12/2019   HDL 36.60 (L) 02/12/2019   LDLCALC 65 02/12/2019   ALT 14 06/21/2020   AST 17 06/21/2020   NA 139 06/21/2020   K 4.1 06/21/2020   CL 103 06/21/2020   CREATININE 0.58 06/21/2020   BUN 10 06/21/2020   CO2 24 06/21/2020   TSH 1.75 06/28/2020    No results found.  Assessment & Plan:   Problem List Items Addressed This Visit      Unprioritized   Restlessness and agitation    Mild improvement with addition of paxil .  Continue prn alprazolam        Drug-induced leukopenia (HCC)    Other Visit Diagnoses    Leukopenia, unspecified type    -  Primary   Relevant Orders   CBC with Differential/Platelet (Completed)   TSH (Completed)    TSH (thyroid-stimulating hormone deficiency)          I have discontinued Lindsey [name]'s loratadine. I am also having her maintain her diazepam, LaMICtal, melatonin, triamcinolone, EPINEPHrine, diazepam, chlorhexidine, levETIRAcetam, OLANZapine zydis, PARoxetine, and ALPRAZolam.  No orders of the defined types were placed in this encounter.   Medications Discontinued During This Encounter  Medication Reason  . loratadine (CLARITIN) 10 MG tablet     Follow-up: Return in about 6 months (around 12/26/2020).   Sherlene Shams, MD

## 2020-06-29 DIAGNOSIS — D702 Other drug-induced agranulocytosis: Secondary | ICD-10-CM | POA: Insufficient documentation

## 2020-06-29 LAB — CBC WITH DIFFERENTIAL/PLATELET
Basophils Absolute: 0 10*3/uL (ref 0.0–0.1)
Basophils Relative: 1 % (ref 0.0–3.0)
Eosinophils Absolute: 0 10*3/uL (ref 0.0–0.7)
Eosinophils Relative: 0.1 % (ref 0.0–5.0)
HCT: 37.7 % (ref 36.0–46.0)
Hemoglobin: 13.3 g/dL (ref 12.0–15.0)
Lymphocytes Relative: 32.6 % (ref 12.0–46.0)
Lymphs Abs: 1.1 10*3/uL (ref 0.7–4.0)
MCHC: 35.4 g/dL (ref 30.0–36.0)
MCV: 84.4 fl (ref 78.0–100.0)
Monocytes Absolute: 0.3 10*3/uL (ref 0.1–1.0)
Monocytes Relative: 9.8 % (ref 3.0–12.0)
Neutro Abs: 1.9 10*3/uL (ref 1.4–7.7)
Neutrophils Relative %: 56.5 % (ref 43.0–77.0)
Platelets: 182 10*3/uL (ref 150.0–400.0)
RBC: 4.47 Mil/uL (ref 3.87–5.11)
RDW: 12.3 % (ref 11.5–15.5)
WBC: 3.3 10*3/uL — ABNORMAL LOW (ref 4.0–10.5)

## 2020-06-29 LAB — TSH: TSH: 1.75 u[IU]/mL (ref 0.35–4.50)

## 2020-07-07 ENCOUNTER — Other Ambulatory Visit: Payer: Self-pay | Admitting: Internal Medicine

## 2020-07-07 NOTE — Telephone Encounter (Signed)
RX Refill:valium Last Seen:06-28-20 Last ordered:02-08-20

## 2020-07-13 ENCOUNTER — Other Ambulatory Visit: Payer: Self-pay | Admitting: Internal Medicine

## 2020-07-13 DIAGNOSIS — R451 Restlessness and agitation: Secondary | ICD-10-CM

## 2020-07-13 NOTE — Assessment & Plan Note (Addendum)
With escalation of behavior resulting in multiple ER visits for management. Now managed with twice daily olanzapine orally dissolving tablet 5 mg , initially prescribed by psychiatrist Mordecai Rasmussen)

## 2020-07-19 NOTE — Telephone Encounter (Signed)
Have you had a chance to initiate the PA?

## 2020-07-21 NOTE — Telephone Encounter (Signed)
Pt called returning your call  She said that you can also send her a Mychart

## 2020-07-21 NOTE — Telephone Encounter (Signed)
LMTCB

## 2020-09-08 ENCOUNTER — Other Ambulatory Visit: Payer: Self-pay | Admitting: Internal Medicine

## 2020-09-19 NOTE — Telephone Encounter (Signed)
Lindsey [name] called and said that she will keep Pt on the Olanzapine. Pt is having behavioral problems when taking the lower dose

## 2020-09-20 MED ORDER — OLANZAPINE 5 MG PO TBDP
5.0000 mg | ORAL_TABLET | Freq: Two times a day (BID) | ORAL | 3 refills | Status: DC
Start: 2020-09-20 — End: 2020-11-22

## 2020-11-21 ENCOUNTER — Other Ambulatory Visit: Payer: Self-pay | Admitting: Internal Medicine

## 2020-11-21 NOTE — Telephone Encounter (Signed)
RX Refill:xanax Last Seen:06-28-20 Last ordered:06-02-20

## 2020-11-22 MED ORDER — OLANZAPINE 5 MG PO TBDP
2.5000 mg | ORAL_TABLET | Freq: Two times a day (BID) | ORAL | 5 refills | Status: DC
Start: 2020-11-22 — End: 2021-03-28

## 2020-12-05 DIAGNOSIS — U071 COVID-19: Secondary | ICD-10-CM

## 2020-12-05 HISTORY — DX: COVID-19: U07.1

## 2020-12-21 ENCOUNTER — Emergency Department: Payer: 59

## 2020-12-21 ENCOUNTER — Emergency Department
Admission: EM | Admit: 2020-12-21 | Discharge: 2020-12-21 | Disposition: A | Discharge status: Home / Self Care | Payer: 59 | Attending: Emergency Medicine | Admitting: Emergency Medicine

## 2020-12-21 ENCOUNTER — Other Ambulatory Visit: Payer: Self-pay

## 2020-12-21 DIAGNOSIS — U071 COVID-19: Secondary | ICD-10-CM | POA: Insufficient documentation

## 2020-12-21 DIAGNOSIS — Z91010 Allergy to peanuts: Secondary | ICD-10-CM | POA: Insufficient documentation

## 2020-12-21 DIAGNOSIS — R000 Tachycardia, unspecified: Secondary | ICD-10-CM | POA: Insufficient documentation

## 2020-12-21 DIAGNOSIS — R059 Cough, unspecified: Secondary | ICD-10-CM | POA: Diagnosis present

## 2020-12-21 LAB — COMPREHENSIVE METABOLIC PANEL
ALT: 14 U/L (ref 0–44)
AST: 23 U/L (ref 15–41)
Albumin: 3.9 g/dL (ref 3.5–5.0)
Alkaline Phosphatase: 79 U/L (ref 38–126)
Anion gap: 9 (ref 5–15)
BUN: 10 mg/dL (ref 6–20)
CO2: 21 mmol/L — ABNORMAL LOW (ref 22–32)
Calcium: 8.8 mg/dL — ABNORMAL LOW (ref 8.9–10.3)
Chloride: 101 mmol/L (ref 98–111)
Creatinine, Ser: 0.64 mg/dL (ref 0.44–1.00)
GFR, Estimated: >60 mL/min (ref >60)
Glucose, Bld: 127 mg/dL — ABNORMAL HIGH (ref 70–99)
Potassium: 3.8 mmol/L (ref 3.5–5.1)
Sodium: 131 mmol/L — ABNORMAL LOW (ref 135–145)
Total Bilirubin: 0.7 mg/dL (ref 0.3–1.2)
Total Protein: 7.2 g/dL (ref 6.5–8.1)

## 2020-12-21 LAB — RESP PANEL BY RT-PCR (FLU A&B, COVID) ARPGX2
Influenza A by PCR: NEGATIVE
Influenza B by PCR: NEGATIVE
SARS Coronavirus 2 by RT PCR: POSITIVE — AB

## 2020-12-21 LAB — CBC WITH DIFFERENTIAL/PLATELET
Abs Immature Granulocytes: 0.02 10*3/uL (ref 0.00–0.07)
Basophils Absolute: 0 10*3/uL (ref 0.0–0.1)
Basophils Relative: 0 %
Eosinophils Absolute: 0 10*3/uL (ref 0.0–0.5)
Eosinophils Relative: 0 %
HCT: 38.3 % (ref 36.0–46.0)
Hemoglobin: 13.1 g/dL (ref 12.0–15.0)
Immature Granulocytes: 0 %
Lymphocytes Relative: 9 %
Lymphs Abs: 0.5 10*3/uL — ABNORMAL LOW (ref 0.7–4.0)
MCH: 29 pg (ref 26.0–34.0)
MCHC: 34.2 g/dL (ref 30.0–36.0)
MCV: 84.9 fL (ref 80.0–100.0)
Monocytes Absolute: 0.4 10*3/uL (ref 0.1–1.0)
Monocytes Relative: 6 %
Neutro Abs: 5 10*3/uL (ref 1.7–7.7)
Neutrophils Relative %: 85 %
Platelets: 186 10*3/uL (ref 150–400)
RBC: 4.51 MIL/uL (ref 3.87–5.11)
RDW: 12.7 % (ref 11.5–15.5)
WBC: 5.9 10*3/uL (ref 4.0–10.5)
nRBC: 0 % (ref 0.0–0.2)

## 2020-12-21 MED ORDER — ONDANSETRON 4 MG PO TBDP: 4.0000 mg | ORAL_TABLET | Freq: Three times a day (TID) | ORAL | 0 refills | Status: AC | PRN

## 2020-12-21 MED ORDER — SODIUM CHLORIDE 0.9 % IV BOLUS
500.0000 mL | Freq: Once | INTRAVENOUS | Status: AC
  Administered 2020-12-21: 500 mL via INTRAVENOUS

## 2020-12-21 MED ORDER — ALBUTEROL SULFATE HFA 108 (90 BASE) MCG/ACT IN AERS
2.0000 | INHALATION_SPRAY | Freq: Once | RESPIRATORY_TRACT | Status: DC | PRN
  Administered 2020-12-21: 2 via RESPIRATORY_TRACT
  Filled 2020-12-21: qty 6.7

## 2020-12-21 MED ORDER — METHYLPREDNISOLONE SODIUM SUCC 125 MG IJ SOLR: 125.0000 mg | Freq: Once | INTRAMUSCULAR | Status: DC | PRN

## 2020-12-21 MED ORDER — FAMOTIDINE IN NACL 20-0.9 MG/50ML-% IV SOLN: 20.0000 mg | Freq: Once | INTRAVENOUS | Status: DC | PRN

## 2020-12-21 MED ORDER — MOLNUPIRAVIR EUA 200MG CAPSULE: 4.0000 | ORAL_CAPSULE | Freq: Two times a day (BID) | ORAL | 0 refills | Status: AC

## 2020-12-21 MED ORDER — BEBTELOVIMAB 175 MG/2 ML IV (EUA)
175.0000 mg | Freq: Once | INTRAMUSCULAR | Status: AC
  Administered 2020-12-21: 175 mg via INTRAVENOUS
  Filled 2020-12-21: qty 2

## 2020-12-21 MED ORDER — IBUPROFEN 800 MG PO TABS
800.0000 mg | ORAL_TABLET | Freq: Once | ORAL | Status: AC
  Administered 2020-12-21: 800 mg via ORAL
  Filled 2020-12-21: qty 1

## 2020-12-21 MED ORDER — EPINEPHRINE 0.3 MG/0.3ML IJ SOAJ: 0.3000 mg | Freq: Once | INTRAMUSCULAR | Status: DC | PRN

## 2020-12-21 MED ORDER — SODIUM CHLORIDE 0.9 % IV SOLN: INTRAVENOUS | Status: DC | PRN

## 2020-12-21 NOTE — ED Provider Notes (Signed)
[facility] Emergency Department Provider Note  ____________________________________________   Event Date/Time   First MD Initiated Contact with Patient 12/21/20 0430     (approximate)  I have reviewed the triage vital signs and the nursing notes.   HISTORY  Chief Complaint Cough    HPI Lindsey [name] is a 35 y.o. female with macrocephaly, CP, significant developmental delay, seizure disorder who comes in with concerns for cough.  Patient has a legal guardian who is the mother who is at bedside.  Per family member she was around somebody who tested positive for COVID and patient is been having cough and fevers at home.  Patient got Tylenol around 3 AM.  No ibuprofen.  No other symptoms other than the cough and the fevers.  Tolerating eating well.  Symptoms started yesterday.  Unable to get full HPI from patient due to their known baseline developmental delay     Past Medical History:  Diagnosis Date   Epilepsy [facility]) age 58   negative metabolic workup has brain abnormalities by MRI (Dr. Quintin Alto, [facility])   Insomnia    Status post VNS (vagus nerve stimulator) placement 2000   Temporal lobectomy behavior syndrome 1994   Right    Patient Active Problem List   Diagnosis Date Noted   Drug-induced leukopenia (HCC) 06/29/2020   Change in behavior 05/04/2020   Moderate intellectual disability 05/04/2020   Urticarial rash 10/30/2019   Right facial swelling 10/30/2019   Poor dentition 09/02/2017   Constipation 11/10/2015   Long-term use of high-risk medication 10/11/2015   Xerosis of skin 12/12/2014   Restless legs syndrome 07/12/2014   Restlessness and agitation 09/10/2012   Routine general medical examination at a health care facility 06/08/2012   Cerebral palsy (HCC) 05/08/2012   Acneiform dermatitis 06/30/2011   Partial epilepsy with intractable epilepsy (HCC) 03/23/2011    Past Surgical History:  Procedure Laterality Date   ABDOMINAL  HYSTERECTOMY  2005   BRAIN SURGERY     Temporal Lobe resection   CRANIOTOMY FOR TEMPORAL LOBECTOMY     TONSILLECTOMY AND ADENOIDECTOMY  age 36    Prior to Admission medications   Medication Sig Start Date End Date Taking? Authorizing Provider  ALPRAZolam (XANAX) 0.25 MG tablet TAKE 1 TABLET BY MOUTH 2 TIMES DAILY AS NEEDED FOR ANXIETY. 11/21/20   Sherlene Shams, MD  chlorhexidine (PERIDEX) 0.12 % solution 15 mLs 2 (two) times daily. 02/10/20   [provider]  diazepam (DIASTAT ACUDIAL) 10 MG GEL Place 20 mg rectally once.    [provider]  diazepam (VALIUM) 10 MG tablet TAKE ONE TABLET EVERY 12 HOURS AS NEEDEDFOR ANXIETY 07/07/20   Sherlene Shams, MD  EPINEPHrine 0.3 mg/0.3 mL IJ SOAJ injection Inject 0.3 mLs (0.3 mg total) into the muscle as needed for anaphylaxis. 12/30/19   Sherlene Shams, MD  LAMICTAL 100 MG tablet Take 100 mg by mouth 2 (two) times daily.  08/03/16   [provider]  levETIRAcetam (KEPPRA) 750 MG tablet Take 3 tablets (2,250 mg total) by mouth daily. TAKE 1 and a half pills twice a day 04/12/20   Clapacs, John T, MD  melatonin 5 MG TABS Take 5 mg by mouth.    [provider]  OLANZapine zydis (ZYPREXA) 5 MG disintegrating tablet Take 0.5 tablets (2.5 mg total) by mouth 2 (two) times daily. NOTE DOSE REDUCTION 11/22/20   Sherlene Shams, MD  PARoxetine (PAXIL) 20 MG tablet Take 1 tablet (20 mg  total) by mouth 2 (two) times daily. 05/10/20   Sherlene Shamsullo, Teresa L, MD  triamcinolone cream (KENALOG) 0.1 % Apply 1 application topically 2 (two) times daily. 10/29/19   Sherlene Shamsullo, Teresa L, MD    Allergies Influenza vac split quad, Benadryl [diphenhydramine hcl], Montelukast, Ropinirole, Zyrtec [cetirizine hcl], and Peanut-containing drug products  No family history on file.  Social History Social History   Tobacco Use   Smoking status: Never   Smokeless tobacco: Never  Vaping Use   Vaping Use: Never used  Substance Use Topics   Alcohol use: No     Alcohol/week: 0.0 standard drinks   Drug use: No      Review of Systems is somewhat limited due to being obtained from family member to developmental disability  Constitutional: Positive fever Eyes: No visual changes. ENT: No sore throat. Cardiovascular: Denies chest pain. Respiratory: Denies shortness of breath.  Positive cough Gastrointestinal: No abdominal pain.  No nausea, no vomiting.  No diarrhea.  No constipation. Genitourinary: Negative for dysuria. Musculoskeletal: Negative for back pain. Skin: Negative for rash. Neurological: Negative for headaches, focal weakness or numbness. All other ROS negative ____________________________________________   PHYSICAL EXAM:  VITAL SIGNS: ED Triage Vitals  Enc Vitals Group     BP 12/21/20 0434 108/84     Pulse Rate 12/21/20 0434 (!) 120     Resp 12/21/20 0434 18     Temp 12/21/20 0434 100 F (37.8 C)     Temp Source 12/21/20 0434 Axillary     SpO2 12/21/20 0434 96 %     Weight 12/21/20 0434 145 lb (65.8 kg)     Height 12/21/20 0434 5\' 6"  (1.676 m)     Head Circumference --      Peak Flow --      Pain Score 12/21/20 0433 0     Pain Loc --      Pain Edu? --      Excl. in GC? --     Constitutional: Alert and oriented.  At her baseline self Eyes: Conjunctivae are normal. EOMI. Head: Atraumatic. Nose: No congestion/rhinnorhea. Mouth/Throat: Mucous membranes are moist.   Neck: No stridor. Trachea Midline. FROM Cardiovascular: Tachycardic, regular rhythm. Grossly normal heart sounds.  Good peripheral circulation. Respiratory: Normal respiratory effort.  No retractions. Lungs CTAB. Gastrointestinal: Soft and nontender. No distention. No abdominal bruits.  Musculoskeletal: No lower extremity tenderness nor edema.  No joint effusions. Neurologic: Speech impediment at baseline, neurologically at baseline Skin:  Skin is warm, dry and intact. No rash noted. Psychiatric unable to fully assess due to developmental delay GU:  Deferred   ____________________________________________   LABS (all labs ordered are listed, but only abnormal results are displayed)  Labs Reviewed  RESP PANEL BY RT-PCR (FLU A&B, COVID) ARPGX2  CBC WITH DIFFERENTIAL/PLATELET  COMPREHENSIVE METABOLIC PANEL   ____________________________________________   ED ECG REPORT I, Concha SeMary E Burt Piatek, the attending physician, personally viewed and interpreted this ECG.  EKG appears to be more of a sinus tachycardia rate of 106, no ST elevation, T wave inversion in lead III, bradycardia QTC prolonged but I think that this is an accurate reviewed prior EKG since she has had sinus tachycardia previously.  It is being read as atrial flutter but I really suspect this is more like a sinus tachycardia.  Repeat EKG is sinus tachycardia rate of 109, no ST elevation, T wave version lead III V3 to V6, normal intervals ____________________________________________  RADIOLOGY Vela ProseI, Dameon Soltis E Tyree Vandruff, personally viewed and evaluated these  images (plain radiographs) as part of my medical decision making, as well as reviewing the written report by the radiologist.  ED MD interpretation: No pneumonia  Official radiology report(s): DG Chest Portable 1 View  Result Date: 12/21/2020 CLINICAL DATA:  Cough and shortness of breath. EXAM: PORTABLE CHEST 1 VIEW COMPARISON:  Chest x-ray dated October 14, 2017. FINDINGS: The patient is rotated to the left. Unchanged left-sided neural stimulator. The heart size and mediastinal contours are within normal limits. No focal consolidation, pleural effusion, or pneumothorax. No acute osseous abnormality. IMPRESSION: 1. No active disease. Electronically Signed   By: Obie Dredge M.D.   On: 12/21/2020 05:32    ____________________________________________   PROCEDURES  Procedure(s) performed (including Critical Care):  Procedures   ____________________________________________   INITIAL IMPRESSION / ASSESSMENT AND PLAN / ED  COURSE  Lindsey [name] was evaluated in Emergency Department on 12/21/2020 for the symptoms described in the history of present illness. She was evaluated in the context of the global COVID-19 pandemic, which necessitated consideration that the patient might be at risk for infection with the SARS-CoV-2 virus that causes COVID-19. Institutional protocols and algorithms that pertain to the evaluation of patients at risk for COVID-19 are in a state of rapid change based on information released by regulatory bodies including the CDC and federal and state organizations. These policies and algorithms were followed during the patient's care in the ED.    Patient comes in for concerns with fevers, cough with COVID exposure.  We will test for COVID, flu, chest x-ray to evaluate for pneumonia.  Given tachycardia will get some basic labs to evaluate for any dehydration   Discussed with pharmacist Ardell Isaacs.  Given patient is on multiple seizure medications sometimes the Paxlvid he can have bad interactions causing behavioral changes, increased sedation ect.  Discussed with them and they stated that she would meet criteria for the monoclonal antibody due to her CP/seizure history.  5:25 AM her sodium is slightly low given that the tachycardia is suspect some mild dehydration so we will give a little bit of fluid.  Patient's heart rates have come down to 109.  She is little agitated suspect could be explain her heart rates.  This time she looks well hydrated.  She tolerated the infusion well I believe that she is safe for discharge home.  Family feels comfortable with this plan will return to the ER for symptoms are worsening.  Prescribed her some Zofran to help with nausea and recommended Tylenol and ibuprofen.  I discussed the provisional nature of ED diagnosis, the treatment so far, the ongoing plan of care, follow up appointments and return precautions with the patient and any family or support people present.  They expressed understanding and agreed with the plan, discharged home.           ____________________________________________   FINAL CLINICAL IMPRESSION(S) / ED DIAGNOSES   Final diagnoses:  COVID-19      MEDICATIONS GIVEN DURING THIS VISIT:  Medications  0.9 %  sodium chloride infusion (has no administration in time range)  famotidine (PEPCID) IVPB 20 mg premix (has no administration in time range)  methylPREDNISolone sodium succinate (SOLU-MEDROL) 125 mg/2 mL injection 125 mg (has no administration in time range)  albuterol (VENTOLIN HFA) 108 (90 Base) MCG/ACT inhaler 2 puff (2 puffs Inhalation Given 12/21/20 0555)  EPINEPHrine (EPI-PEN) injection 0.3 mg (has no administration in time range)  ibuprofen (ADVIL) tablet 800 mg (800 mg Oral Given 12/21/20 0451)  sodium chloride  0.9 % bolus 500 mL (500 mLs Intravenous Bolus 12/21/20 0530)  bebtelovimab EUA injection SOLN 175 mg (175 mg Intravenous Given 12/21/20 0556)     ED Discharge Orders          Ordered    ondansetron (ZOFRAN ODT) 4 MG disintegrating tablet  Every 8 hours PRN        12/21/20 0544             Note:  This document was prepared using Dragon voice recognition software and may include unintentional dictation errors.    Concha Se, MD 12/21/20 [phone]

## 2020-12-21 NOTE — ED Triage Notes (Signed)
Pt lives in the house with someone who tested positive for COVid, pt has been coughing and having fevers at home. Pt was last given tylenol around 0300

## 2020-12-21 NOTE — Discharge Instructions (Signed)
Tylenol 1 g every 8 hours, ibuprofen 600 every 6 hours with food to help with fevers, pain.  Return to the ER for shortness of breath or any other concerns.  This will take time for her body to fight off.  We have given her the antibody infusion to help decrease the severity of illness.

## 2020-12-22 NOTE — Telephone Encounter (Signed)
Spoke with Pete Pelt about message and she stated that she would reach out to pt's mother.

## 2020-12-29 ENCOUNTER — Encounter: Payer: Self-pay | Admitting: Internal Medicine

## 2020-12-29 ENCOUNTER — Telehealth (INDEPENDENT_AMBULATORY_CARE_PROVIDER_SITE_OTHER): Payer: 59 | Admitting: Internal Medicine

## 2020-12-29 DIAGNOSIS — R451 Restlessness and agitation: Secondary | ICD-10-CM | POA: Diagnosis not present

## 2020-12-29 DIAGNOSIS — U071 COVID-19: Secondary | ICD-10-CM | POA: Diagnosis not present

## 2020-12-29 MED ORDER — BENZONATATE 200 MG PO CAPS: 200.0000 mg | ORAL_CAPSULE | Freq: Three times a day (TID) | ORAL | 1 refills | Status: DC | PRN

## 2020-12-29 NOTE — Progress Notes (Signed)
Virtual Visit converted to Telepho  This visit type was conducted due to national recommendations for restrictions regarding the COVID-19 pandemic (e.g. social distancing).  This format is felt to be most appropriate for this patient at this time.  All issues noted in this document were discussed and addressed.  No physical exam was performed (except for noted visual exam findings with Video Visits).   I connected withNAME@ on 12/29/20 at  9:00 AM EDT by a video enabled telemedicine application and verified that I am speaking with the correct person using two identifiers. Location patient: home Note  Location provider: work or home office Persons participating in the virtual visit: patient, provider and patient's mother Lindsey [name]   I discussed the limitations, risks, security and privacy concerns of performing an evaluation and management service by telephone and the availability of in person appointments. I also discussed with the patient that there may be a patient responsible charge related to this service. The patient expressed understanding and agreed to proceed.  Interactive audio and video telecommunications were attempted between this provider and patient, however she lost video , due to patient having technical difficulties OR patient did not have access to video capability.  We continued and completed visit with audio only.  Reason for visit: ACUTE COVID INFECTION   HPI:  35 YR OLD COVID VACCINATED  FEMALE WITH CEREBRAL PALSY ,  SEIZURE DISORDER PRESENTS FOR EVALUATION FOLLOWING RECENT COVID DIAGNOSIS. DIAGNOSED AUGUST 17 SYMPTOMS WERE COUGH  AND FEVER TO 101 .  FEVERS HAVE RESOLVED,  COUGH STILL MOIST SOUNDING BUT NONPRODUCTIVE.  WORSE AT NIGHT,. MOTHER HAS NOT heard wheezing or noticed increased work of breathing.  Appetite fine. taking mucinex dm.    Cerebral palsy with behavioral issues:  Had an emotional outbreak yesterday ,  became angry and started taking her clothes off .  Mother  attributes it to cabin fever.      Still taking seroquel   ROS: See pertinent positives and negatives per HPI.  Past Medical History:  Diagnosis Date   Epilepsy [facility]) age 89   negative metabolic workup has brain abnormalities by MRI (Dr. Quintin Alto, [facility])   Insomnia    Status post VNS (vagus nerve stimulator) placement 2000   Temporal lobectomy behavior syndrome 1994   Right    Past Surgical History:  Procedure Laterality Date   ABDOMINAL HYSTERECTOMY  2005   BRAIN SURGERY     Temporal Lobe resection   CRANIOTOMY FOR TEMPORAL LOBECTOMY     TONSILLECTOMY AND ADENOIDECTOMY  age 62    No family history on file.  SOCIAL HX: disabled, dependent on mother    Current Outpatient Medications:    ALPRAZolam (XANAX) 0.25 MG tablet, TAKE 1 TABLET BY MOUTH 2 TIMES DAILY AS NEEDED FOR ANXIETY., Disp: 45 tablet, Rfl: 5   benzonatate (TESSALON) 200 MG capsule, Take 1 capsule (200 mg total) by mouth 3 (three) times daily as needed for cough., Disp: 60 capsule, Rfl: 1   chlorhexidine (PERIDEX) 0.12 % solution, 15 mLs 2 (two) times daily., Disp: , Rfl:    diazepam (DIASTAT ACUDIAL) 10 MG GEL, Place 20 mg rectally once., Disp: , Rfl:    diazepam (VALIUM) 10 MG tablet, TAKE ONE TABLET EVERY 12 HOURS AS NEEDEDFOR ANXIETY, Disp: 30 tablet, Rfl: 5   EPINEPHrine 0.3 mg/0.3 mL IJ SOAJ injection, Inject 0.3 mLs (0.3 mg total) into the muscle as needed for anaphylaxis., Disp: 1 each, Rfl: 1   LAMICTAL 100 MG tablet, Take 100 mg by  mouth 2 (two) times daily. , Disp: , Rfl:    levETIRAcetam (KEPPRA) 750 MG tablet, Take 3 tablets (2,250 mg total) by mouth daily. TAKE 1 and a half pills twice a day, Disp: 90 tablet, Rfl: 0   melatonin 5 MG TABS, Take 5 mg by mouth., Disp: , Rfl:    OLANZapine zydis (ZYPREXA) 5 MG disintegrating tablet, Take 0.5 tablets (2.5 mg total) by mouth 2 (two) times daily. NOTE DOSE REDUCTION, Disp: 30 tablet, Rfl: 5   PARoxetine (PAXIL) 20 MG tablet, Take 1 tablet (20 mg total) by mouth 2  (two) times daily., Disp: 180 tablet, Rfl: 2   VALTOCO 20 MG DOSE 10 MG/0.1ML LQPK, Place into both nostrils., Disp: , Rfl:   EXAM:   General impression: alert, cooperative and articulate.  No signs of being in distress  Lungs: speech is fluent sentence length suggests that patient is not short of breath and not punctuated by cough, sneezing or sniffing. Marland Kitchen   Psych: affect normal.  speech is articulate and non pressured .  Denies suicidal thoughts    ASSESSMENT AND PLAN:  Discussed the following assessment and plan:  COVID-19 virus infection  Restlessness and agitation  COVID-19 virus infection Diagnosed in ER on August 18 but not prescribed antiviral for unclear reasons.  She has completed the molnupiravir I sent in and doing well per mother Lindsey [name],  But has a persistent cough.  Tessalon perles sent in   Restlessness and agitation With recurrent episodes of outbursts which culiminate in patient disrobing .  Continue seroquel    I discussed the assessment and treatment plan with the patient. The patient was provided an opportunity to ask questions and all were answered. The patient agreed with the plan and demonstrated an understanding of the instructions.   The patient was advised to call back or seek an in-person evaluation if the symptoms worsen or if the condition fails to improve as anticipated.   I spent 30 minutes dedicated to the care of this patient on the date of this encounter to include  reviewof ER records,  review of her medical history,  NON Face-to-face time with the patient , and post visit ordering OF therapeutics.    Sherlene Shams, MD

## 2020-12-31 NOTE — Assessment & Plan Note (Signed)
With recurrent episodes of outbursts which culiminate in patient disrobing .  Continue seroquel

## 2020-12-31 NOTE — Assessment & Plan Note (Signed)
Diagnosed in ER on August 18 but not prescribed antiviral for unclear reasons.  She has completed the molnupiravir I sent in and doing well per mother [name],  But has a persistent cough.  Tessalon perles sent in

## 2021-01-02 ENCOUNTER — Other Ambulatory Visit: Payer: Self-pay | Admitting: Internal Medicine

## 2021-01-03 MED ORDER — CHERATUSSIN AC 100-10 MG/5ML PO SOLN: 5.0000 mL | Freq: Three times a day (TID) | ORAL | 0 refills | Status: DC | PRN

## 2021-01-03 NOTE — Telephone Encounter (Signed)
Last OV 12/29/20 last refill 3/22 with 5 refills okay to fill?

## 2021-01-04 ENCOUNTER — Ambulatory Visit: Payer: 59 | Admitting: Dermatology

## 2021-01-05 ENCOUNTER — Other Ambulatory Visit: Payer: Self-pay

## 2021-01-05 ENCOUNTER — Ambulatory Visit (INDEPENDENT_AMBULATORY_CARE_PROVIDER_SITE_OTHER): Payer: 59 | Admitting: Dermatology

## 2021-01-05 DIAGNOSIS — L821 Other seborrheic keratosis: Secondary | ICD-10-CM

## 2021-01-05 DIAGNOSIS — L578 Other skin changes due to chronic exposure to nonionizing radiation: Secondary | ICD-10-CM | POA: Diagnosis not present

## 2021-01-05 NOTE — Progress Notes (Signed)
   Follow-Up Visit   Subjective  Lindsey [name] is a 35 y.o. female who presents for the following: Spot check (Pts mom states that pt has 2 new spots on her back that she would like to have checked today. ).  Pt mother accompanies pt today who contributes to history.    The following portions of the chart were reviewed this encounter and updated as appropriate:  Tobacco  Allergies  Meds  Problems  Med Hx  Surg Hx  Fam Hx     Review of Systems: No other skin or systemic complaints except as noted in HPI or Assessment and Plan.  Objective  Well appearing patient in no apparent distress; mood and affect are within normal limits.  A focused examination was performed including back. Relevant physical exam findings are noted in the Assessment and Plan.  Left Upper Back Stuck-on, waxy, tan-brown papules and plaques -- Discussed benign etiology and prognosis.   Assessment & Plan  Seborrheic keratosis Left Upper Back  Discussed benign. If become bothersome, itchy, or flaky can be removed in the future. Pt defers treatment today.   Actinic Damage - chronic, secondary to cumulative UV radiation exposure/sun exposure over time - diffuse scaly erythematous macules with underlying dyspigmentation - Recommend daily broad spectrum sunscreen SPF 30+ to sun-exposed areas, reapply every 2 hours as needed.  - Recommend staying in the shade or wearing long sleeves, sun glasses (UVA+UVB protection) and wide brim hats (4-inch brim around the entire circumference of the hat). - Call for new or changing lesions.  Return if symptoms worsen or fail to improve.  Lindsey [name], CMA, am acting as scribe for Armida Sans, MD. Documentation: I have reviewed the above documentation for accuracy and completeness, and I agree with the above.  Armida Sans, MD

## 2021-01-06 ENCOUNTER — Other Ambulatory Visit: Payer: Self-pay | Admitting: Internal Medicine

## 2021-01-11 ENCOUNTER — Encounter: Payer: Self-pay | Admitting: Dermatology

## 2021-02-10 ENCOUNTER — Ambulatory Visit (INDEPENDENT_AMBULATORY_CARE_PROVIDER_SITE_OTHER): Payer: 59 | Admitting: Internal Medicine

## 2021-02-10 ENCOUNTER — Other Ambulatory Visit: Payer: Self-pay

## 2021-02-10 ENCOUNTER — Encounter: Payer: Self-pay | Admitting: Internal Medicine

## 2021-02-10 DIAGNOSIS — T50905A Adverse effect of unspecified drugs, medicaments and biological substances, initial encounter: Secondary | ICD-10-CM | POA: Diagnosis not present

## 2021-02-10 DIAGNOSIS — G2589 Other specified extrapyramidal and movement disorders: Secondary | ICD-10-CM | POA: Diagnosis not present

## 2021-02-10 NOTE — Patient Instructions (Signed)
Change diazepam to 5 pm instead of bedtime   Stop the zyprexa.  If Lindsey [name]'s agitation increases,  you can  add a morning dose of diazepam ( start with 5 mg )

## 2021-02-10 NOTE — Progress Notes (Signed)
Subjective:  Patient ID: Lindsey [name], female    DOB: 02/07/1986  Age: 35 y.o. MRN: [id number]  CC: The encounter diagnosis was Drug-induced extrapyramidal movement disorder.  HPI Lindsey [name] presents for  follow up on behavioral issues    Ventura is a 14 yr ld female with cerebral palsy and seizure disorder  who has developed behavior problems in the last year that appear to be aggravated by her living situation . Patient and her mother live with patient's grandparents.  patient's grandfather has dementia and has developed behavioral l issues .  apparently he has  become  obsessed with sex and  he is antagonistic toward patient) . When the atmosphere becomes loud,  patient gets agitated  and takes off her clothes and/or become physically aggressive   Zyprexa  was added to manage her agitation have been added but mother notices an increasingly frequent resting tremor of patient's hands ,  chomping of teeth and has requested a change in regimen    ]This visit occurred during the SARS-CoV-2 public health emergency.  Safety protocols were in place, including screening questions prior to the visit, additional usage of staff PPE, and extensive cleaning of exam room while observing appropriate contact time as indicated for disinfecting solutions.     Current regimen:  200 mg lamictal,  1/2 zyprexa, keppra 1.5 tablets., 1 alprazolam and paxil. (Morning)   at   5pm: melatonin and paxil.    Evening .amictal, zyprexa, diazepam alprazolam and keppra    Outpatient Medications Prior to Visit  Medication Sig Dispense Refill   ALPRAZolam (XANAX) 0.25 MG tablet TAKE 1 TABLET BY MOUTH 2 TIMES DAILY AS NEEDED FOR ANXIETY. 45 tablet 5   benzonatate (TESSALON) 200 MG capsule Take 1 capsule (200 mg total) by mouth 3 (three) times daily as needed for cough. 60 capsule 1   chlorhexidine (PERIDEX) 0.12 % solution 15 mLs 2 (two) times daily.     diazepam (DIASTAT ACUDIAL) 10 MG GEL Place 20 mg  rectally once.     diazepam (VALIUM) 10 MG tablet TAKE ONE TABLET EVERY 12 HOURS AS NEEDEDFOR ANXIETY 30 tablet 5   EPINEPHrine 0.3 mg/0.3 mL IJ SOAJ injection Inject 0.3 mLs (0.3 mg total) into the muscle as needed for anaphylaxis. 1 each 1   guaiFENesin-codeine (CHERATUSSIN AC) 100-10 MG/5ML syrup Take 5 mLs by mouth 3 (three) times daily as needed for cough. 120 mL 0   LAMICTAL 100 MG tablet Take 100 mg by mouth 2 (two) times daily.      levETIRAcetam (KEPPRA) 750 MG tablet Take 3 tablets (2,250 mg total) by mouth daily. TAKE 1 and a half pills twice a day 90 tablet 0   melatonin 5 MG TABS Take 5 mg by mouth.     OLANZapine zydis (ZYPREXA) 5 MG disintegrating tablet Take 0.5 tablets (2.5 mg total) by mouth 2 (two) times daily. NOTE DOSE REDUCTION 30 tablet 5   PARoxetine (PAXIL) 20 MG tablet TAKE 1 TABLET BY MOUTH TWICE DAILY 180 tablet 2   VALTOCO 20 MG DOSE 10 MG/0.1ML LQPK Place into both nostrils.     No facility-administered medications prior to visit.    Review of Systems;  Patient is unable to provide any history but per mother has not had headache, fevers, malaise, unintentional weight loss, skin rash, eye pain, sinus congestion and sinus pain, sore throat, dysphagia,  hemoptysis , cough, dyspnea, wheezing, chest pain, palpitations, orthopnea, edema, abdominal pain, nausea, melena, diarrhea, constipation, flank  pain, dysuria, hematuria, urinary  Frequency, nocturia, numbness, tingling, seizures,  Focal weakness, Loss of consciousness,  insomnia, depression, anxiety, and suicidal ideation.      Objective:  BP 126/78   Pulse 96   Ht 5\' 6"  (1.676 m)   Wt 220 lb (99.8 kg) Comment: stated  LMP 01/09/2011   SpO2 97%   BMI 35.51 kg/m   BP Readings from Last 3 Encounters:  02/10/21 126/78  12/21/20 115/82  06/28/20 112/84    Wt Readings from Last 3 Encounters:  02/10/21 220 lb (99.8 kg)  12/29/20 145 lb (65.8 kg)  12/21/20 145 lb (65.8 kg)    General appearance: alert,  cooperative and appears younger than stated age Ears: normal TM's and external ear canals both ears Throat: lips, mucosa, and tongue normal; teeth and gums normal Neck: no adenopathy, no carotid bruit, supple, symmetrical, trachea midline and thyroid not enlarged, symmetric, no tenderness/mass/nodules Back: symmetric, no curvature. ROM normal. No CVA tenderness. Lungs: clear to auscultation bilaterally Heart: regular rate and rhythm, S1, S2 normal, no murmur, click, rub or gallop Abdomen: soft, non-tender; bowel sounds normal; no masses,  no organomegaly Pulses: 2+ and symmetric Skin: Skin color, texture, turgor normal. No rashes or lesions Lymph nodes: Cervical, supraclavicular, and axillary nodes normal. Neuro: fine resting tremor of both hands. Otherwise unchanged   No results found for: HGBA1C  Lab Results  Component Value Date   CREATININE 0.64 12/21/2020   CREATININE 0.58 06/21/2020   CREATININE 0.53 05/04/2020    Lab Results  Component Value Date   WBC 5.9 12/21/2020   HGB 13.1 12/21/2020   HCT 38.3 12/21/2020   PLT 186 12/21/2020   GLUCOSE 127 (H) 12/21/2020   CHOL 113 02/12/2019   TRIG 59.0 02/12/2019   HDL 36.60 (L) 02/12/2019   LDLCALC 65 02/12/2019   ALT 14 12/21/2020   AST 23 12/21/2020   NA 131 (L) 12/21/2020   K 3.8 12/21/2020   CL 101 12/21/2020   CREATININE 0.64 12/21/2020   BUN 10 12/21/2020   CO2 21 (L) 12/21/2020   TSH 1.75 06/28/2020    DG Chest Portable 1 View  Result Date: 12/21/2020 CLINICAL DATA:  Cough and shortness of breath. EXAM: PORTABLE CHEST 1 VIEW COMPARISON:  Chest x-ray dated October 14, 2017. FINDINGS: The patient is rotated to the left. Unchanged left-sided neural stimulator. The heart size and mediastinal contours are within normal limits. No focal consolidation, pleural effusion, or pneumothorax. No acute osseous abnormality. IMPRESSION: 1. No active disease. Electronically Signed   By: October 16, 2017 M.D.   On: 12/21/2020 05:32     Assessment & Plan:   Problem List Items Addressed This Visit       Unprioritized   Drug-induced extrapyramidal movement disorder    akasthesia and parkinsonian syndrome exhibited by patient since starting Zyprexa  . Stopping medication . Changing diazepam to 5 mg bid       I am having Erminia E. Spano maintain her diazepam, LaMICtal, melatonin, EPINEPHrine, chlorhexidine, levETIRAcetam, ALPRAZolam, OLANZapine zydis, Valtoco 20 MG Dose, benzonatate, diazepam, Cheratussin AC, and PARoxetine.  No orders of the defined types were placed in this encounter.   There are no discontinued medications.  Follow-up: No follow-ups on file.   12/23/2020, MD

## 2021-02-12 DIAGNOSIS — T50905A Adverse effect of unspecified drugs, medicaments and biological substances, initial encounter: Secondary | ICD-10-CM | POA: Insufficient documentation

## 2021-02-12 NOTE — Assessment & Plan Note (Signed)
akasthesia and parkinsonian syndrome exhibited by patient since starting Zyprexa  . Stopping medication . Changing diazepam to 5 mg bid

## 2021-02-21 ENCOUNTER — Ambulatory Visit: Payer: 59 | Admitting: Internal Medicine

## 2021-03-16 ENCOUNTER — Encounter: Payer: Self-pay | Admitting: Internal Medicine

## 2021-03-16 ENCOUNTER — Ambulatory Visit (INDEPENDENT_AMBULATORY_CARE_PROVIDER_SITE_OTHER): Payer: 59 | Admitting: Internal Medicine

## 2021-03-16 ENCOUNTER — Other Ambulatory Visit: Payer: Self-pay

## 2021-03-16 VITALS — BP 102/64 | HR 114 | Ht 66.0 in | Wt 220.0 lb

## 2021-03-16 DIAGNOSIS — E871 Hypo-osmolality and hyponatremia: Secondary | ICD-10-CM | POA: Diagnosis not present

## 2021-03-16 DIAGNOSIS — R7301 Impaired fasting glucose: Secondary | ICD-10-CM | POA: Diagnosis not present

## 2021-03-16 DIAGNOSIS — G809 Cerebral palsy, unspecified: Secondary | ICD-10-CM

## 2021-03-16 DIAGNOSIS — G2589 Other specified extrapyramidal and movement disorders: Secondary | ICD-10-CM | POA: Diagnosis not present

## 2021-03-16 DIAGNOSIS — Z0000 Encounter for general adult medical examination without abnormal findings: Secondary | ICD-10-CM

## 2021-03-16 DIAGNOSIS — K5900 Constipation, unspecified: Secondary | ICD-10-CM

## 2021-03-16 DIAGNOSIS — G40119 Localization-related (focal) (partial) symptomatic epilepsy and epileptic syndromes with simple partial seizures, intractable, without status epilepticus: Secondary | ICD-10-CM

## 2021-03-16 DIAGNOSIS — E669 Obesity, unspecified: Secondary | ICD-10-CM

## 2021-03-16 DIAGNOSIS — T50905A Adverse effect of unspecified drugs, medicaments and biological substances, initial encounter: Secondary | ICD-10-CM

## 2021-03-16 LAB — LIPID PANEL
Cholesterol: 150 mg/dL (ref 0–200)
HDL: 33.5 mg/dL — ABNORMAL LOW (ref >39.00)
LDL Cholesterol: 96 mg/dL (ref 0–99)
NonHDL: 116.7
Total CHOL/HDL Ratio: 4
Triglycerides: 103 mg/dL (ref 0.0–149.0)
VLDL: 20.6 mg/dL (ref 0.0–40.0)

## 2021-03-16 LAB — COMPREHENSIVE METABOLIC PANEL
ALT: 7 U/L (ref 0–35)
AST: 13 U/L (ref 0–37)
Albumin: 4.3 g/dL (ref 3.5–5.2)
Alkaline Phosphatase: 97 U/L (ref 39–117)
BUN: 11 mg/dL (ref 6–23)
CO2: 25 mEq/L (ref 19–32)
Calcium: 9.1 mg/dL (ref 8.4–10.5)
Chloride: 103 mEq/L (ref 96–112)
Creatinine, Ser: 0.65 mg/dL (ref 0.40–1.20)
GFR: 115.51 mL/min (ref >60.00)
Glucose, Bld: 106 mg/dL — ABNORMAL HIGH (ref 70–99)
Potassium: 3.7 mEq/L (ref 3.5–5.1)
Sodium: 136 mEq/L (ref 135–145)
Total Bilirubin: 0.3 mg/dL (ref 0.2–1.2)
Total Protein: 7 g/dL (ref 6.0–8.3)

## 2021-03-16 LAB — HEMOGLOBIN A1C: Hgb A1c MFr Bld: 4.9 % (ref 4.6–6.5)

## 2021-03-16 NOTE — Assessment & Plan Note (Signed)
.  Annual comprehensive preventive exam was done as well as an evaluation and management of chronic conditions .  During the course of the visit the patient's mother  was educated and counseled about appropriate screening and preventive services including :  diabetes screening, lipid analysis with projected  10 year  risk for CAD , nutrition counseling,  and recommended immunizations.  Printed recommendations for health maintenance screenings was given

## 2021-03-16 NOTE — Progress Notes (Signed)
Patient ID: Lindsey [name], female    DOB: 10/02/1985  Age: 35 y.o. MRN: [id number]  The patient is here for annual preventive  examination and management of other chronic and acute problems.   The risk factors are reflected in the social history.  The roster of all physicians providing medical care to patient - is listed in the Snapshot section of the chart.  Activities of daily living:  The patient is  dependent on  her mother  for completion of all ADLs: dressing, toileting, and feeding .   Home safety : The patient has smoke detectors in the home. They wear seatbelts.  There are no firearms at home. There is no violence in the home.   There is no risks for hepatitis, STDs or HIV. There is no   history of blood transfusion. They have no travel history to infectious disease endemic areas of the world.  The patient has seen their dentist in the last six month. They have seen their eye doctor in the last year. They admit to slight hearing difficulty with regard to whispered voices and some television programs.  They have deferred audiologic testing in the last year.  They do not  have excessive sun exposure. Discussed the need for sun protection: hats, long sleeves and use of sunscreen if there is significant sun exposure.   Diet: the importance of a healthy diet is discussed. Her mother has been indulging her in a diet that is calorically higher than her daily caloric  expenditure.   The benefits of regular aerobic exercise were discussed. She walks 4 times per week ,  20 minutes.   Depression screen: there are no signs or vegative symptoms of depression- irritability, change in appetite, anhedonia, sadness/tearfullness.  Cognitive assessment: the patient cannot manage her financial and personal affairs.  She cannot  relate day,date,year and events;   The following portions of the patient's history were reviewed and updated as appropriate: allergies, current medications, past family  history, past medical history,  past surgical history, past social history  and problem list.  Visual acuity was not assessed per patient preference since she has regular follow up with her ophthalmologist. Hearing and body mass index were assessed and reviewed.   During the course of the visit the patient was educated and counseled about appropriate screening and preventive services including : fall prevention , diabetes screening, nutrition counseling, colorectal cancer screening, and recommended immunizations.    CC: The primary encounter diagnosis was Hyponatremia. Diagnoses of Impaired fasting glucose, Drug-induced extrapyramidal movement disorder, Constipation, unspecified constipation type, Obesity (BMI 35.0-39.9 without comorbidity), Cerebral palsy, unspecified type (HCC), Partial epilepsy with intractable epilepsy (HCC), and Routine general medical examination at a health care facility were also pertinent to this visit.  1) constipation:  no success with Linzess.  Mother giving her dulcolax every other day.   2) mood lability:  doing better , less tantrums . Continues to have tremors of hands from the medications.  Grandfather now medicated with zoloft which is helping  reduce his agitation .and therefore her tantrums.  3) Obesity:  diet discussed with mother.  Need for exercise also discussed.    History Lindsey [name] has a past medical history of Epilepsy (HCC) (age 16), Insomnia, Status post VNS (vagus nerve stimulator) placement (2000), and Temporal lobectomy behavior syndrome (1994).   She has a past surgical history that includes Tonsillectomy and adenoidectomy (age 66); Abdominal hysterectomy (2005); Brain surgery; and Craniotomy for temporal lobectomy.   Her family  history is not on file.She reports that she has never smoked. She has never used smokeless tobacco. She reports that she does not drink alcohol and does not use drugs.  Outpatient Medications Prior to Visit  Medication Sig  Dispense Refill   ALPRAZolam (XANAX) 0.25 MG tablet TAKE 1 TABLET BY MOUTH 2 TIMES DAILY AS NEEDED FOR ANXIETY. 45 tablet 5   benzonatate (TESSALON) 200 MG capsule Take 1 capsule (200 mg total) by mouth 3 (three) times daily as needed for cough. 60 capsule 1   chlorhexidine (PERIDEX) 0.12 % solution 15 mLs 2 (two) times daily.     diazepam (DIASTAT ACUDIAL) 10 MG GEL Place 20 mg rectally once.     diazepam (VALIUM) 10 MG tablet TAKE ONE TABLET EVERY 12 HOURS AS NEEDEDFOR ANXIETY 30 tablet 5   EPINEPHrine 0.3 mg/0.3 mL IJ SOAJ injection Inject 0.3 mLs (0.3 mg total) into the muscle as needed for anaphylaxis. 1 each 1   guaiFENesin-codeine (CHERATUSSIN AC) 100-10 MG/5ML syrup Take 5 mLs by mouth 3 (three) times daily as needed for cough. 120 mL 0   LAMICTAL 100 MG tablet Take 100 mg by mouth 2 (two) times daily.      levETIRAcetam (KEPPRA) 750 MG tablet Take 3 tablets (2,250 mg total) by mouth daily. TAKE 1 and a half pills twice a day 90 tablet 0   melatonin 5 MG TABS Take 5 mg by mouth.     OLANZapine zydis (ZYPREXA) 5 MG disintegrating tablet Take 0.5 tablets (2.5 mg total) by mouth 2 (two) times daily. NOTE DOSE REDUCTION 30 tablet 5   PARoxetine (PAXIL) 20 MG tablet TAKE 1 TABLET BY MOUTH TWICE DAILY 180 tablet 2   VALTOCO 20 MG DOSE 10 MG/0.1ML LQPK Place into both nostrils.     No facility-administered medications prior to visit.    Review of Systems  Patient denies headache, fevers, malaise, unintentional weight loss, skin rash, eye pain, sinus congestion and sinus pain, sore throat, dysphagia,  hemoptysis , cough, dyspnea, wheezing, chest pain, palpitations, orthopnea, edema, abdominal pain, nausea, melena, diarrhea, constipation, flank pain, dysuria, hematuria, urinary  Frequency, nocturia, numbness, tingling, seizures,  Focal weakness, Loss of consciousness,  Tremor, insomnia, depression, anxiety, and suicidal ideation.     Objective:  BP 102/64 (BP Location: Left Arm, Patient  Position: Sitting, Cuff Size: Normal)   Pulse (!) 114   Ht 5\' 6"  (1.676 m)   Wt 220 lb (99.8 kg)   LMP 01/09/2011   SpO2 96%   BMI 35.51 kg/m   Physical Exam  .General appearance: alert, intermittently cooperative and appears older than stated age Ears: bilateral cerumen impaction obscuring TM's and external ear canals both ears Throat: lips, mucosa, and tongue normal; teeth and gums normal Neck: no adenopathy, no carotid bruit, supple, symmetrical, trachea midline and thyroid not enlarged, symmetric, no tenderness/mass/nodules Back: symmetric, no curvature. ROM normal. No CVA tenderness. Lungs: clear to auscultation bilaterally Heart: regular rate and rhythm, S1, S2 normal, no murmur, click, rub or gallop Abdomen: soft, non-tender; bowel sounds normal; no masses,  no organomegaly Pulses: 2+ and symmetric Skin: Skin color, texture, turgor normal. No rashes or lesions Lymph nodes: Cervical, supraclavicular, and axillary nodes normal.  Neuro  bilateral hand  tremor , speech inarticulate and limited to simple sentences   Assessment & Plan:   Problem List Items Addressed This Visit     Partial epilepsy with intractable epilepsy ([place])    She has been seizure free for over 20 months .  Continue lamictal and Keppra.  lamictal dose reduce gradually by Dr Quintin Alto [facility]) due to tremor reported by mother which is still present on today's visit      Cerebral palsy [facility])    With intellectual disability and seizure disorder.  History of temporal lobe resection .  She remains dependent on mother       Routine general medical examination at a health care facility    .Annual comprehensive preventive exam was done as well as an evaluation and management of chronic conditions .  During the course of the visit the patient's mother  was educated and counseled about appropriate screening and preventive services including :  diabetes screening, lipid analysis with projected  10 year  risk for  CAD , nutrition counseling,  and recommended immunizations.  Printed recommendations for health maintenance screenings was given      Constipation    Using dulcolax every other day   As linzess was not helpful.  Advised to add benefiber daily in 8 ounces of water prior to biggest meal as an appetite suppressant       Drug-induced extrapyramidal movement disorder    akasthesia and parkinsonian syndrome exhibited by patient since starting Zyprexa .  Symptoms have persisted despite discontinuing  medication .Continue diazepam for agitation , 5 mg bid prn      Obesity (BMI 35.0-39.9 without comorbidity)    Secondary to high glycemic index diet and lack of regular exercise.  Limited room for improvement due to patient's intellectual capacity. .  Diet discussed with mother       Other Visit Diagnoses     Hyponatremia    -  Primary   Relevant Orders   Comprehensive metabolic panel (Completed)   Impaired fasting glucose       Relevant Orders   Hemoglobin A1c (Completed)   Lipid panel (Completed)       I am having Lindsey [name] maintain her diazepam, LaMICtal, melatonin, EPINEPHrine, chlorhexidine, levETIRAcetam, ALPRAZolam, OLANZapine zydis, Valtoco 20 MG Dose, benzonatate, diazepam, Cheratussin AC, and PARoxetine.  No orders of the defined types were placed in this encounter.   There are no discontinued medications.  Follow-up: No follow-ups on file.   Sherlene Shams, MD

## 2021-03-16 NOTE — Assessment & Plan Note (Signed)
With intellectual disability and seizure disorder.  History of temporal lobe resection .  She remains dependent on mother

## 2021-03-16 NOTE — Assessment & Plan Note (Signed)
She has been seizure free for over 20 months .  Continue lamictal and Keppra.  lamictal dose reduce gradually by Dr Quintin [facility]) due to tremor reported by mother which is still present on today's visit

## 2021-03-16 NOTE — Patient Instructions (Addendum)
Lindsey [name] needs to lose weight !    I recommend giving her  an eight ounce glass of water with Benefiber  in it   about 30 minutes prior to her biggest meal to increase satiety   More green beans,  less casserole !    Less pancakes ,  the syrup is not the issue!    Encourage fruit (apples,  berries,   ) as a snack

## 2021-03-16 NOTE — Assessment & Plan Note (Signed)
Using dulcolax every other day   As linzess was not helpful.  Advised to add benefiber daily in 8 ounces of water prior to biggest meal as an appetite suppressant

## 2021-03-16 NOTE — Assessment & Plan Note (Signed)
akasthesia and parkinsonian syndrome exhibited by patient since starting Zyprexa .  Symptoms have persisted despite discontinuing  medication .Continue diazepam for agitation , 5 mg bid prn

## 2021-03-16 NOTE — Assessment & Plan Note (Signed)
Secondary to high glycemic index diet and lack of regular exercise.  Limited room for improvement due to patient's intellectual capacity. .  Diet discussed with mother

## 2021-03-28 ENCOUNTER — Other Ambulatory Visit: Payer: Self-pay

## 2021-03-28 ENCOUNTER — Ambulatory Visit (INDEPENDENT_AMBULATORY_CARE_PROVIDER_SITE_OTHER): Payer: 59 | Admitting: Internal Medicine

## 2021-03-28 ENCOUNTER — Encounter: Payer: Self-pay | Admitting: Internal Medicine

## 2021-03-28 VITALS — BP 120/80 | HR 73 | Temp 96.8°F

## 2021-03-28 DIAGNOSIS — G2589 Other specified extrapyramidal and movement disorders: Secondary | ICD-10-CM

## 2021-03-28 DIAGNOSIS — T50905A Adverse effect of unspecified drugs, medicaments and biological substances, initial encounter: Secondary | ICD-10-CM | POA: Diagnosis not present

## 2021-03-28 DIAGNOSIS — R451 Restlessness and agitation: Secondary | ICD-10-CM

## 2021-03-28 MED ORDER — DIAZEPAM 5 MG PO TABS: 7.5000 mg | ORAL_TABLET | Freq: Two times a day (BID) | ORAL | 5 refills | Status: DC | PRN

## 2021-03-28 NOTE — Patient Instructions (Signed)
The tremor [name] has is a common side effect of using medications like  Seroquel, Risperdal and Zyprexa  I recommend increasing her valium to 7.5 mg twice daily,  or 5 mg three times daily (whichever regimen she tolerates better )   Talk to Dr Quintin Alto about a psychiatrist who specializes in patients like Lindsey [name]   I have ordered the urine tests .

## 2021-03-28 NOTE — Assessment & Plan Note (Signed)
will rule out UTI as aggravating factor

## 2021-03-28 NOTE — Assessment & Plan Note (Signed)
She has a Parkinsonian type tremor of both hands,  Likely from use of 2nd generation antipsychotics.  All of which have been stopped .  Will use valium to control agitation.  Dose rewritten to allow mother to administer 5 mg every 8 hours prn or 7.5 mg bid prn .  I have encouraged her to contact Anagabriela's neurologist as a resource for psychiatrist .

## 2021-03-28 NOTE — Progress Notes (Signed)
Subjective:  Patient ID: Lindsey [name], female    DOB: Dec 20, 1985  Age: 35 y.o. MRN: [id number]  CC: The primary encounter diagnosis was Restlessness and agitation. A diagnosis of Drug-induced extrapyramidal movement disorder was also pertinent to this visit.  HPI Lindsey [name] presents for evaluation and treatment of tremor  This visit occurred during the SARS-CoV-2 public health emergency.  Safety protocols were in place, including screening questions prior to the visit, additional usage of staff PPE, and extensive cleaning of exam room while observing appropriate contact time as indicated for disinfecting solutions.   35 yr old female with intellectual deficits secondary to cerebral palsy with seizure disorder presents with worsening tremor attributed to EPS from use of 2nd generation antipsychotic which has been discontinued.  Patient required use of Zyprexa for recurrent episodes of aggressive uncontrolled agitation which would culminate in taking her clothes of,, screaming and striking out at her mother .  Zyprexa was recommended in Dec 2021 during ER evaluation by psychiatry and was discontinued in October 2022   Patient's mother provides all history as patient is only able to repeat small phrases ina perseverative manner. Mother states that for the last 7 days Solaris has been up most nights,  but last night she gave her her 5 mg valium dose later in the evening which helped calm her down    Seroquel and risperdal were used in 2016 and 2018 to control nocturnal agitation     Outpatient Medications Prior to Visit  Medication Sig Dispense Refill   chlorhexidine (PERIDEX) 0.12 % solution 15 mLs 2 (two) times daily.     diazepam (DIASTAT ACUDIAL) 10 MG GEL Place 20 mg rectally once.     EPINEPHrine 0.3 mg/0.3 mL IJ SOAJ injection Inject 0.3 mLs (0.3 mg total) into the muscle as needed for anaphylaxis. 1 each 1   LAMICTAL 100 MG tablet Take 100 mg by mouth 2 (two) times daily.       levETIRAcetam (KEPPRA) 750 MG tablet Take 3 tablets (2,250 mg total) by mouth daily. TAKE 1 and a half pills twice a day 90 tablet 0   melatonin 5 MG TABS Take 5 mg by mouth.     PARoxetine (PAXIL) 20 MG tablet TAKE 1 TABLET BY MOUTH TWICE DAILY 180 tablet 2   VALTOCO 20 MG DOSE 10 MG/0.1ML LQPK Place into both nostrils.     ALPRAZolam (XANAX) 0.25 MG tablet TAKE 1 TABLET BY MOUTH 2 TIMES DAILY AS NEEDED FOR ANXIETY. 45 tablet 5   diazepam (VALIUM) 10 MG tablet TAKE ONE TABLET EVERY 12 HOURS AS NEEDEDFOR ANXIETY 30 tablet 5   benzonatate (TESSALON) 200 MG capsule Take 1 capsule (200 mg total) by mouth 3 (three) times daily as needed for cough. 60 capsule 1   guaiFENesin-codeine (CHERATUSSIN AC) 100-10 MG/5ML syrup Take 5 mLs by mouth 3 (three) times daily as needed for cough. 120 mL 0   OLANZapine zydis (ZYPREXA) 5 MG disintegrating tablet Take 0.5 tablets (2.5 mg total) by mouth 2 (two) times daily. NOTE DOSE REDUCTION 30 tablet 5   No facility-administered medications prior to visit.    Review of Systems;  Patient unable to provide ROS but mother denies headache, fevers, malaise, unintentional weight loss, skin rash, eye pain, sinus congestion and sinus pain, sore throat, dysphagia,  hemoptysis , cough, dyspnea, wheezing, chest pain, palpitations, orthopnea, edema, abdominal pain, nausea, melena, diarrhea, constipation, flank pain, dysuria, hematuria,  numbness, tingling, recent seizures,  Focal weakness, Loss of  consciousness,, depression,, and suicidal ideation.      Objective:  BP 120/80   Pulse 73   Temp (!) 96.8 F (36 C) (Oral)   LMP 01/09/2011   SpO2 99%   BP Readings from Last 3 Encounters:  03/28/21 120/80  03/16/21 102/64  02/10/21 126/78    Wt Readings from Last 3 Encounters:  03/16/21 220 lb (99.8 kg)  02/10/21 220 lb (99.8 kg)  12/29/20 145 lb (65.8 kg)    General appearance: alert, intermittently cooperative and appears solder than stated age Throat: lips,  mucosa, and tongue normal; teeth and gums normal Neck: no adenopathy, no carotid bruit, supple, symmetrical, trachea midline and thyroid not enlarged, symmetric, no tenderness/mass/nodules Back: symmetric, no curvature. ROM normal. No CVA tenderness. Lungs: clear to auscultation bilaterally Heart: regular rate and rhythm, S1, S2 normal, no murmur, click, rub or gallop Abdomen: soft, non-tender; bowel sounds normal; no masses,  no organomegaly Pulses: 2+ and symmetric Skin: Skin color, texture, turgor normal. No rashes or lesions Lymph nodes: Cervical, supraclavicular, and axillary nodes normal. Neuro: bilateral hand tremor starting at the wrist.   Lab Results  Component Value Date   HGBA1C 4.9 03/16/2021    Lab Results  Component Value Date   CREATININE 0.65 03/16/2021   CREATININE 0.64 12/21/2020   CREATININE 0.58 06/21/2020    Lab Results  Component Value Date   WBC 5.9 12/21/2020   HGB 13.1 12/21/2020   HCT 38.3 12/21/2020   PLT 186 12/21/2020   GLUCOSE 106 (H) 03/16/2021   CHOL 150 03/16/2021   TRIG 103.0 03/16/2021   HDL 33.50 (L) 03/16/2021   LDLCALC 96 03/16/2021   ALT 7 03/16/2021   AST 13 03/16/2021   NA 136 03/16/2021   K 3.7 03/16/2021   CL 103 03/16/2021   CREATININE 0.65 03/16/2021   BUN 11 03/16/2021   CO2 25 03/16/2021   TSH 1.75 06/28/2020   HGBA1C 4.9 03/16/2021    DG Chest Portable 1 View  Result Date: 12/21/2020 CLINICAL DATA:  Cough and shortness of breath. EXAM: PORTABLE CHEST 1 VIEW COMPARISON:  Chest x-ray dated October 14, 2017. FINDINGS: The patient is rotated to the left. Unchanged left-sided neural stimulator. The heart size and mediastinal contours are within normal limits. No focal consolidation, pleural effusion, or pneumothorax. No acute osseous abnormality. IMPRESSION: 1. No active disease. Electronically Signed   By: Obie Dredge M.D.   On: 12/21/2020 05:32    Assessment & Plan:   Problem List Items Addressed This Visit      Restlessness and agitation - Primary    will rule out UTI as aggravating factor       Relevant Medications   diazepam (VALIUM) 5 MG tablet   Other Relevant Orders   Urinalysis, Routine w reflex microscopic   Urine Culture   Drug-induced extrapyramidal movement disorder    She has a Parkinsonian type tremor of both hands,  Likely from use of 2nd generation antipsychotics.  All of which have been stopped .  Will use valium to control agitation.  Dose rewritten to allow mother to administer 5 mg every 8 hours prn or 7.5 mg bid prn .  I have encouraged her to contact Kailei's neurologist as a resource for psychiatrist .        I have discontinued Soua E. Ditmer's ALPRAZolam and OLANZapine zydis. I am also having her start on diazepam. Additionally, I am having her maintain her diazepam, LaMICtal, melatonin, EPINEPHrine, chlorhexidine, levETIRAcetam, Valtoco 20 MG  Dose, benzonatate, Cheratussin AC, and PARoxetine.  Meds ordered this encounter  Medications   diazepam (VALIUM) 5 MG tablet    Sig: Take 1.5 tablets (7.5 mg total) by mouth every 12 (twelve) hours as needed for anxiety.    Dispense:  90 tablet    Refill:  5    Medications Discontinued During This Encounter  Medication Reason   OLANZapine zydis (ZYPREXA) 5 MG disintegrating tablet Patient Preference   diazepam (VALIUM) 10 MG tablet    ALPRAZolam (XANAX) 0.25 MG tablet     Follow-up: No follow-ups on file.   Crecencio Mc, MD

## 2021-03-29 LAB — URINALYSIS, ROUTINE W REFLEX MICROSCOPIC
Bilirubin Urine: NEGATIVE
Ketones, ur: NEGATIVE
Leukocytes,Ua: NEGATIVE
Nitrite: NEGATIVE
Specific Gravity, Urine: 1.02 (ref 1.000–1.030)
Total Protein, Urine: NEGATIVE
Urine Glucose: NEGATIVE
Urobilinogen, UA: 1 (ref 0.0–1.0)
pH: 6.5 (ref 5.0–8.0)

## 2021-03-31 LAB — URINE CULTURE
MICRO NUMBER:: 12675665
SPECIMEN QUALITY:: ADEQUATE

## 2021-04-06 ENCOUNTER — Telehealth: Payer: Self-pay | Admitting: Internal Medicine

## 2021-04-06 NOTE — Telephone Encounter (Signed)
FORM COMPLETED.  NO CHARGE

## 2021-04-06 NOTE — Telephone Encounter (Signed)
Placed in red folder  

## 2021-04-06 NOTE — Telephone Encounter (Signed)
A Over the counter Medication order form was dropped off for Dr Darrick Huntsman to review and sign. Form is up front in Dr Melina Schools color folder.

## 2021-04-07 NOTE — Telephone Encounter (Signed)
Placed in envelope to be mailed back to pt's mother.

## 2021-04-10 ENCOUNTER — Emergency Department: Payer: 59

## 2021-04-10 ENCOUNTER — Emergency Department
Admission: EM | Admit: 2021-04-10 | Discharge: 2021-04-10 | Disposition: A | Discharge status: Home / Self Care | Payer: 59 | Attending: Emergency Medicine | Admitting: Emergency Medicine

## 2021-04-10 ENCOUNTER — Other Ambulatory Visit: Payer: Self-pay

## 2021-04-10 DIAGNOSIS — Z8616 Personal history of COVID-19: Secondary | ICD-10-CM | POA: Insufficient documentation

## 2021-04-10 DIAGNOSIS — Y92002 Bathroom of unspecified non-institutional (private) residence single-family (private) house as the place of occurrence of the external cause: Secondary | ICD-10-CM | POA: Insufficient documentation

## 2021-04-10 DIAGNOSIS — W01198A Fall on same level from slipping, tripping and stumbling with subsequent striking against other object, initial encounter: Secondary | ICD-10-CM | POA: Diagnosis not present

## 2021-04-10 DIAGNOSIS — S0993XA Unspecified injury of face, initial encounter: Secondary | ICD-10-CM | POA: Diagnosis present

## 2021-04-10 DIAGNOSIS — Z91010 Allergy to peanuts: Secondary | ICD-10-CM | POA: Insufficient documentation

## 2021-04-10 DIAGNOSIS — S032XXA Dislocation of tooth, initial encounter: Secondary | ICD-10-CM

## 2021-04-10 DIAGNOSIS — S0990XA Unspecified injury of head, initial encounter: Secondary | ICD-10-CM | POA: Diagnosis not present

## 2021-04-10 MED ORDER — PENICILLIN V POTASSIUM 500 MG PO TABS
500.0000 mg | ORAL_TABLET | Freq: Once | ORAL | Status: AC
  Administered 2021-04-10: 500 mg via ORAL
  Filled 2021-04-10: qty 1

## 2021-04-10 MED ORDER — OXYCODONE-ACETAMINOPHEN 5-325 MG PO TABS
1.0000 | ORAL_TABLET | Freq: Once | ORAL | Status: AC
  Administered 2021-04-10: 1 via ORAL
  Filled 2021-04-10: qty 1

## 2021-04-10 MED ORDER — MIDAZOLAM HCL 5 MG/5ML IJ SOLN
1.0000 mg | Freq: Once | INTRAMUSCULAR | Status: AC
  Administered 2021-04-10: 1 mg via INTRAMUSCULAR

## 2021-04-10 MED ORDER — PENICILLIN V POTASSIUM 500 MG PO TABS: 500.0000 mg | ORAL_TABLET | Freq: Four times a day (QID) | ORAL | 0 refills | Status: AC

## 2021-04-10 MED ORDER — MIDAZOLAM HCL 5 MG/5ML IJ SOLN
1.0000 mg | Freq: Once | INTRAMUSCULAR | Status: DC
  Filled 2021-04-10: qty 5

## 2021-04-10 NOTE — ED Provider Notes (Addendum)
[facility]  ____________________________________________   Event Date/Time   First MD Initiated Contact with Patient 04/10/21 [phone]     (approximate)  I have reviewed the triage vital signs and the nursing notes.   HISTORY  Chief Complaint Fall    HPI Lindsey [name] is a 35 y.o. female with past medical history of epilepsy, intellectual disability who presents after a fall.  Patient was going to the bathroom and pulled her pants down prior to getting to the toilet and then fell forward.  She did hit her head did not lose consciousness.  Her right central incisor was subluxed.  This happened about 10 minutes prior to arrival.  Patient denies pain.  Per mom she is at her mental status baseline.         Past Medical History:  Diagnosis Date   Epilepsy [facility]) age 42   negative metabolic workup has brain abnormalities by MRI (Dr. Lisabeth Devoid, [facility])   Insomnia    Status post VNS (vagus nerve stimulator) placement 2000   Temporal lobectomy behavior syndrome 1994   Right    Patient Active Problem List   Diagnosis Date Noted   Obesity (BMI 35.0-39.9 without comorbidity) 03/16/2021   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   COVID-19 virus infection 12/21/2020   Drug-induced leukopenia ([place]) 06/29/2020   Change in behavior 05/04/2020   Moderate intellectual disability 05/04/2020   Urticarial rash 10/30/2019   Right facial swelling 10/30/2019   Poor dentition 09/02/2017   Constipation 11/10/2015   Long-term use of high-risk medication 10/11/2015   Xerosis of skin 12/12/2014   Restless legs syndrome 07/12/2014   Restlessness and agitation 09/10/2012   Routine general medical examination at a health care facility 06/08/2012   Cerebral palsy ([place]) 05/08/2012   Acneiform dermatitis 06/30/2011   Partial epilepsy with intractable epilepsy ([place]) 03/23/2011    Past Surgical History:  Procedure Laterality Date   ABDOMINAL HYSTERECTOMY  2005    BRAIN SURGERY     Temporal Lobe resection   CRANIOTOMY FOR TEMPORAL LOBECTOMY     TONSILLECTOMY AND ADENOIDECTOMY  age 57    Prior to Admission medications   Medication Sig Start Date End Date Taking? Authorizing Provider  penicillin v potassium (VEETID) 500 MG tablet Take 1 tablet (500 mg total) by mouth 4 (four) times daily for 7 days. 04/10/21 04/17/21 Yes Rada Hay, MD  benzonatate (TESSALON) 200 MG capsule Take 1 capsule (200 mg total) by mouth 3 (three) times daily as needed for cough. 12/29/20   Crecencio Mc, MD  chlorhexidine (PERIDEX) 0.12 % solution 15 mLs 2 (two) times daily. 02/10/20   [provider]  diazepam (DIASTAT ACUDIAL) 10 MG GEL Place 20 mg rectally once.    [provider]  diazepam (VALIUM) 5 MG tablet Take 1.5 tablets (7.5 mg total) by mouth every 12 (twelve) hours as needed for anxiety. 03/28/21   Crecencio Mc, MD  EPINEPHrine 0.3 mg/0.3 mL IJ SOAJ injection Inject 0.3 mLs (0.3 mg total) into the muscle as needed for anaphylaxis. 12/30/19   Crecencio Mc, MD  guaiFENesin-codeine (CHERATUSSIN AC) 100-10 MG/5ML syrup Take 5 mLs by mouth 3 (three) times daily as needed for cough. 01/03/21   Crecencio Mc, MD  LAMICTAL 100 MG tablet Take 100 mg by mouth 2 (two) times daily.  08/03/16   [provider]  levETIRAcetam (KEPPRA) 750 MG tablet Take 3 tablets (2,250 mg total) by mouth daily. TAKE 1 and a half  pills twice a day 04/12/20   Clapacs, John T, MD  melatonin 5 MG TABS Take 5 mg by mouth.    [provider]  PARoxetine (PAXIL) 20 MG tablet TAKE 1 TABLET BY MOUTH TWICE DAILY 01/06/21   Crecencio Mc, MD  VALTOCO 20 MG DOSE 10 MG/0.1ML LQPK Place into both nostrils. 12/12/20   [provider]    Allergies Other, Influenza vac split quad, Benadryl [diphenhydramine hcl], Montelukast, Ropinirole, Zyrtec [cetirizine hcl], and Peanut-containing drug products  No family history on file.  Social History Social  History   Tobacco Use   Smoking status: Never   Smokeless tobacco: Never  Vaping Use   Vaping Use: Never used  Substance Use Topics   Alcohol use: No    Alcohol/week: 0.0 standard drinks   Drug use: No    Review of Systems   Review of Systems  HENT:  Positive for dental problem. Negative for facial swelling.   Psychiatric/Behavioral:  Negative for agitation.   All other systems reviewed and are negative.  Physical Exam Updated Vital Signs BP 111/86 (BP Location: Left Arm)   Pulse 87   Temp 98.3 F (36.8 C) (Oral)   Resp 18   Ht 5\' 6"  (1.676 m)   Wt 99.8 kg   LMP 01/09/2011   SpO2 97%   BMI 35.51 kg/m   Physical Exam Vitals and nursing note reviewed.  Constitutional:      General: She is not in acute distress.    Appearance: Normal appearance.  HENT:     Head: Normocephalic.     Mouth/Throat:     Comments: Right central incisor with complete subluxation, tooth with root intact, No trismus, no obvious laceration No other luxation or loosening of the other teeth Eyes:     General: No scleral icterus.    Conjunctiva/sclera: Conjunctivae normal.  Pulmonary:     Effort: Pulmonary effort is normal. No respiratory distress.     Breath sounds: No stridor.  Musculoskeletal:        General: No deformity or signs of injury.     Cervical back: Normal range of motion.  Skin:    General: Skin is dry.     Coloration: Skin is not jaundiced or pale.  Neurological:     General: No focal deficit present.     Mental Status: She is alert and oriented to person, place, and time. Mental status is at baseline.  Psychiatric:        Mood and Affect: Mood normal.        Behavior: Behavior normal.     LABS (all labs ordered are listed, but only abnormal results are displayed)  Labs Reviewed - No data to display ____________________________________________  EKG  N/a ____________________________________________  RADIOLOGY I, Madelin Headings, personally viewed and  evaluated these images (plain radiographs) as part of my medical decision making, as well as reviewing the written report by the radiologist.  ED MD interpretation:  I reviewed the CT scan of the brain which does not show any acute intracranial process   I reviewed the CT of the cervical spine which does not show any acute fracture or misalignment      ____________________________________________   PROCEDURES  Procedure(s) performed (including Critical Care):  Procedures   ____________________________________________   INITIAL IMPRESSION / ASSESSMENT AND PLAN / ED COURSE     Patient is a 35 year old female with intellectual disability who presents with a fall and a dental avulsion.  Patient presents  about 10 to 20 minutes after the tooth was knocked out.  I saw the patient in triage and the right central incisor was immediately reimplanted.  She is taken to a room and a dental splint was applied.  CT max face and CT head obtained.  Patient will try to see her dentist this morning after 8 AM.  She was given prophylactic antibiotics.  Unfortunately patient pulled out the dental splint with the tooth while in CAT scan.   A second dental splint was then applied.  CT does not show any alveolar bone fracture or other trauma.  Patient will be discharged with antibiotics.  Mom planning to go straight to dentist.  Mom spoke with [facility] dentistry and they were not willing to see the patient today.  I then spoke with [facility] dental urgent care and they are able to see the patient today at 130.     ____________________________________________   FINAL CLINICAL IMPRESSION(S) / ED DIAGNOSES  Final diagnoses:  Tooth avulsion, initial encounter     ED Discharge Orders          Ordered    penicillin v potassium (VEETID) 500 MG tablet  4 times daily        04/10/21 0859             Note:  This document was prepared using Dragon voice recognition software and may include unintentional  dictation errors.    Georga Hacking, MD 04/10/21 0900    Georga Hacking, MD 04/10/21 [phone]

## 2021-04-10 NOTE — Discharge Instructions (Addendum)
Take the antibiotic 4 times daily for the next 7 days to prevent infection.  Please see your dentist as soon as possible.

## 2021-04-10 NOTE — ED Notes (Signed)
Provider was at bedside

## 2021-04-10 NOTE — ED Notes (Signed)
Dr. Carie Caddy out to triage to reinsert pt's tooth and assess.

## 2021-04-10 NOTE — ED Triage Notes (Signed)
Pt's mother states pt was ambulating and fell face forward. Per mother no LOC. Pt with dislodged tooth in front. Pt with tooth present. Pt appears in no acute distress, but is not able to communicate well.

## 2021-04-18 ENCOUNTER — Other Ambulatory Visit: Payer: Self-pay | Admitting: Internal Medicine

## 2021-04-18 NOTE — Telephone Encounter (Signed)
RX Refill: xanax Last Seen: 03-28-21 Last Ordered: 11-21-20 Next Appt: 03-19-22

## 2021-04-19 ENCOUNTER — Ambulatory Visit: Payer: 59 | Admitting: Internal Medicine

## 2021-04-20 ENCOUNTER — Ambulatory Visit: Payer: 59 | Admitting: Internal Medicine

## 2021-05-18 ENCOUNTER — Encounter: Payer: Self-pay | Admitting: Anesthesiology

## 2021-05-18 ENCOUNTER — Encounter: Payer: Self-pay | Admitting: Unknown Physician Specialty

## 2021-05-26 ENCOUNTER — Ambulatory Visit: Admission: RE | Admit: 2021-05-26 | Payer: 59 | Source: Home / Self Care | Admitting: Unknown Physician Specialty

## 2021-05-26 HISTORY — DX: Unspecified convulsions: R56.9

## 2021-05-26 SURGERY — EXAM UNDER ANESTHESIA
Anesthesia: General | Laterality: Bilateral

## 2021-05-28 ENCOUNTER — Encounter: Payer: Self-pay | Admitting: Internal Medicine

## 2021-05-30 ENCOUNTER — Other Ambulatory Visit: Payer: Self-pay | Admitting: Internal Medicine

## 2021-05-30 DIAGNOSIS — F71 Moderate intellectual disabilities: Secondary | ICD-10-CM

## 2021-05-30 DIAGNOSIS — R4689 Other symptoms and signs involving appearance and behavior: Secondary | ICD-10-CM

## 2021-05-30 DIAGNOSIS — R451 Restlessness and agitation: Secondary | ICD-10-CM

## 2021-06-10 ENCOUNTER — Emergency Department
Admission: EM | Admit: 2021-06-10 | Discharge: 2021-06-11 | Disposition: A | Discharge status: Home / Self Care | Payer: 59 | Attending: Emergency Medicine | Admitting: Emergency Medicine

## 2021-06-10 ENCOUNTER — Other Ambulatory Visit: Payer: Self-pay

## 2021-06-10 DIAGNOSIS — F79 Unspecified intellectual disabilities: Secondary | ICD-10-CM | POA: Insufficient documentation

## 2021-06-10 DIAGNOSIS — G809 Cerebral palsy, unspecified: Secondary | ICD-10-CM | POA: Diagnosis present

## 2021-06-10 DIAGNOSIS — Z8616 Personal history of COVID-19: Secondary | ICD-10-CM | POA: Diagnosis not present

## 2021-06-10 LAB — CBC WITH DIFFERENTIAL/PLATELET
Abs Immature Granulocytes: 0.01 10*3/uL (ref 0.00–0.07)
Basophils Absolute: 0 10*3/uL (ref 0.0–0.1)
Basophils Relative: 0 %
Eosinophils Absolute: 0 10*3/uL (ref 0.0–0.5)
Eosinophils Relative: 0 %
HCT: 42.2 % (ref 36.0–46.0)
Hemoglobin: 14.1 g/dL (ref 12.0–15.0)
Immature Granulocytes: 0 %
Lymphocytes Relative: 30 %
Lymphs Abs: 1.9 10*3/uL (ref 0.7–4.0)
MCH: 28 pg (ref 26.0–34.0)
MCHC: 33.4 g/dL (ref 30.0–36.0)
MCV: 83.7 fL (ref 80.0–100.0)
Monocytes Absolute: 0.4 10*3/uL (ref 0.1–1.0)
Monocytes Relative: 7 %
Neutro Abs: 4 10*3/uL (ref 1.7–7.7)
Neutrophils Relative %: 63 %
Platelets: 252 10*3/uL (ref 150–400)
RBC: 5.04 MIL/uL (ref 3.87–5.11)
RDW: 12.9 % (ref 11.5–15.5)
WBC: 6.4 10*3/uL (ref 4.0–10.5)
nRBC: 0 % (ref 0.0–0.2)

## 2021-06-10 LAB — URINALYSIS, COMPLETE (UACMP) WITH MICROSCOPIC
Bilirubin Urine: NEGATIVE
Glucose, UA: NEGATIVE mg/dL
Ketones, ur: NEGATIVE mg/dL
Leukocytes,Ua: NEGATIVE
Nitrite: NEGATIVE
Protein, ur: >300 mg/dL — AB
Specific Gravity, Urine: 1.025 (ref 1.005–1.030)
pH: 6.5 (ref 5.0–8.0)

## 2021-06-10 NOTE — ED Provider Notes (Signed)
[facility] Provider Note    Event Date/Time   First MD Initiated Contact with Patient 06/10/21 2255     (approximate)   History   Fussy   HPI  Lindsey [name] is a 36 y.o. female with a history of cerebral palsy, intellectual disability, temporal lobectomy who presents from home for fussiness.  According to the mother patient has been having this kind of behavior intermittently for the last 6 months.  She has been referred to psychiatry but has not been able to get in with them yet.  She is currently on Paxil.  Patient will have outbursts when she starts crying inconsolably for several hours at a time, will start hitting herself, kicking and hitting her mom, and screaming.  Mother reports that this usually happens when she does not get her way.  This evening it was time for bed and patient did not want to go to bed when she started behaving like that.  Mother reports that her mood has declined significantly over the last several years since the pandemic due to last outings and social events.  Patient is able to communicate fine with her mom and denies any pain, she has had no fever, no cough, no vomiting or diarrhea.     Past Medical History:  Diagnosis Date   COVID-19 12/2020   Epilepsy [facility]) age 81   negative metabolic workup has brain abnormalities by MRI (Dr. Lisabeth Devoid, [facility])   Insomnia    Seizures ([place])    None since approx 2018   Status post VNS (vagus nerve stimulator) placement 05/07/1998   Temporal lobectomy behavior syndrome 05/07/1992   Right    Past Surgical History:  Procedure Laterality Date   ABDOMINAL HYSTERECTOMY  2005   BRAIN SURGERY     Temporal Lobe resection   CRANIOTOMY FOR TEMPORAL LOBECTOMY     TONSILLECTOMY AND ADENOIDECTOMY  age 59     Physical Exam   Triage Vital Signs: ED Triage Vitals [06/10/21 2219]  Enc Vitals Group     BP 103/78     Pulse Rate 85     Resp 19     Temp 97.6 F (36.4 C)     Temp Source Axillary      SpO2 97 %     Weight      Height      Head Circumference      Peak Flow      Pain Score      Pain Loc      Pain Edu?      Excl. in [place]?     Most recent vital signs: Vitals:   06/10/21 2219  BP: 103/78  Pulse: 85  Resp: 19  Temp: 97.6 F (36.4 C)  SpO2: 97%     Constitutional: Alert and oriented. Well appearing and in no apparent distress. HEENT:      Head: Normocephalic and atraumatic.         Eyes: Conjunctivae are normal. Sclera is non-icteric.       Mouth/Throat: Mucous membranes are moist.       Neck: Supple with no signs of meningismus. Cardiovascular: Regular rate and rhythm.  Respiratory: Normal respiratory effort. Lungs are clear to auscultation bilaterally.  Gastrointestinal: Soft, non tender, and non distended with positive bowel sounds. No rebound or guarding. Musculoskeletal:  No edema, cyanosis, or erythema of extremities. Neurologic: Normal speech and language. Face is symmetric. Moving all extremities. No gross focal neurologic deficits are appreciated. Skin:  Skin is warm, dry and intact. No rash noted. Psychiatric: Mood and affect are normal. Speech and behavior are normal.  ED Results / Procedures / Treatments   Labs (all labs ordered are listed, but only abnormal results are displayed) Labs Reviewed  COMPREHENSIVE METABOLIC PANEL - Abnormal; Notable for the following components:      Result Value   Sodium 131 (*)    CO2 21 (*)    All other components within normal limits  URINALYSIS, COMPLETE (UACMP) WITH MICROSCOPIC - Abnormal; Notable for the following components:   APPearance CLEAR (*)    Hgb urine dipstick MODERATE (*)    Protein, ur >300 (*)    Bacteria, UA FEW (*)    All other components within normal limits  CBC WITH DIFFERENTIAL/PLATELET  HCG, QUANTITATIVE, PREGNANCY     EKG  none   RADIOLOGY none   PROCEDURES:  Critical Care performed: No  Procedures    IMPRESSION / MDM / ASSESSMENT AND PLAN / ED COURSE  I  reviewed the triage vital signs and the nursing notes.  36 y.o. female with a history of cerebral palsy, intellectual disability, temporal lobectomy who presents from home for fussiness.  Patient is well-appearing but when I talk to her she does start crying inconsolably.  When I stop talking to her and start talking to her mom she will stop crying and pays attention to the conversation.  She denies any pain.  Her exam is otherwise nonfocal  Ddx: Seems to me that this is most likely behavioral but since she has had prior history of hyponatremia and her communication skills are not normal I will go ahead and get some lab work and urine to rule out an alternative etiology for her fussiness.   Plan: CBC, BMP, urinalysis, pregnancy test, psychiatric consult   MEDICATIONS GIVEN IN ED: Medications  traZODone (DESYREL) tablet 50 mg (has no administration in time range)     ED COURSE: Labs show no signs of UTI, no electrolyte derangements, no dehydration, no AKI, no sepsis.  Psychiatry evaluated patient and feels the patient is safe to go home.  Mother does not want to leave patient in the ER for placement and wants to take her home.  Mother is requesting a prescription to help patient's sleep.  We will provide a short course of trazodone.  Recommended close follow-up with primary care doctor for further management of patient's behavioral issues.  No indication for admission at this time   Consults: Psychiatry   EMR reviewed including last visit with patient's primary care doctor from November 2022 for the same complaint    FINAL CLINICAL IMPRESSION(S) / ED DIAGNOSES   Final diagnoses:  Cerebral palsy, unspecified type ([place])  Intellectual disability     Rx / DC Orders   ED Discharge Orders          Ordered    traZODone (DESYREL) 50 MG tablet  Daily at bedtime        06/11/21 0026             Note:  This document was prepared using Dragon voice recognition software and may  include unintentional dictation errors.   Please note:  Patient was evaluated in Emergency Department today for the symptoms described in the history of present illness. Patient was evaluated in the context of the global COVID-19 pandemic, which necessitated consideration that the patient might be at risk for infection with the SARS-CoV-2 virus that causes COVID-19. Institutional protocols and algorithms that  pertain to the evaluation of patients at risk for COVID-19 are in a state of rapid change based on information released by regulatory bodies including the CDC and federal and state organizations. These policies and algorithms were followed during the patient's care in the ED.  Some ED evaluations and interventions may be delayed as a result of limited staffing during the pandemic.       Alfred Levins, [place], MD 06/11/21 [phone]

## 2021-06-10 NOTE — ED Triage Notes (Signed)
Pt presents to ER with mother.  Per mother, pt has been crying non-stop since appx 2030 tonight.  Mother does not know why pt has been crying.  Pt not c/o pain at this time.  Pt has hx of temporal lobectomy behavior syndrome and is awaiting psychiatric referral from PCP.  Pt not crying in triage at this time.  Pt alert to baseline at this time.

## 2021-06-11 DIAGNOSIS — G809 Cerebral palsy, unspecified: Secondary | ICD-10-CM | POA: Diagnosis not present

## 2021-06-11 LAB — COMPREHENSIVE METABOLIC PANEL
ALT: 9 U/L (ref 0–44)
AST: 16 U/L (ref 15–41)
Albumin: 3.9 g/dL (ref 3.5–5.0)
Alkaline Phosphatase: 92 U/L (ref 38–126)
Anion gap: 7 (ref 5–15)
BUN: 7 mg/dL (ref 6–20)
CO2: 21 mmol/L — ABNORMAL LOW (ref 22–32)
Calcium: 8.9 mg/dL (ref 8.9–10.3)
Chloride: 103 mmol/L (ref 98–111)
Creatinine, Ser: 0.63 mg/dL (ref 0.44–1.00)
GFR, Estimated: >60 mL/min (ref >60)
Glucose, Bld: 98 mg/dL (ref 70–99)
Potassium: 4.1 mmol/L (ref 3.5–5.1)
Sodium: 131 mmol/L — ABNORMAL LOW (ref 135–145)
Total Bilirubin: 0.4 mg/dL (ref 0.3–1.2)
Total Protein: 7.2 g/dL (ref 6.5–8.1)

## 2021-06-11 LAB — HCG, QUANTITATIVE, PREGNANCY: hCG, Beta Chain, Quant, S: <1 m[IU]/mL (ref <5)

## 2021-06-11 MED ORDER — TRAZODONE HCL 50 MG PO TABS: 50.0000 mg | ORAL_TABLET | Freq: Every day | ORAL | 0 refills | Status: DC

## 2021-06-11 MED ORDER — TRAZODONE HCL 50 MG PO TABS
50.0000 mg | ORAL_TABLET | Freq: Once | ORAL | Status: AC
  Administered 2021-06-11: 50 mg via ORAL
  Filled 2021-06-11: qty 1

## 2021-06-12 ENCOUNTER — Other Ambulatory Visit: Payer: Self-pay | Admitting: Internal Medicine

## 2021-06-12 ENCOUNTER — Telehealth: Payer: Self-pay | Admitting: Internal Medicine

## 2021-06-12 ENCOUNTER — Encounter: Payer: Self-pay | Admitting: Internal Medicine

## 2021-06-12 MED ORDER — TRAZODONE HCL 50 MG PO TABS
50.0000 mg | ORAL_TABLET | Freq: Every day | ORAL | 1 refills | Status: DC
Start: 2021-06-12 — End: 2021-10-20

## 2021-06-12 NOTE — Telephone Encounter (Signed)
No appointments available.

## 2021-06-12 NOTE — Telephone Encounter (Signed)
Patient's mother called. Patient is having crying spells since Grandfather has come home form the nursing home. Police was called on Saturday, mother was advised to go to ED with Patient. Patient also not keeping clothes on, patient states she is scared. No available appointments with Dr Darrick Huntsman. Hospital did a urine and labs, nothing showed up. Patient was sent home with 2 weeks of traZODone (DESYREL) 50 MG tablet.

## 2021-06-12 NOTE — Telephone Encounter (Signed)
Spoke with pt's mother and informed her that she could give her 1/2 a diazepam in the morning. She stated that the Trazodone that they gave her in the hospital has helped her. She stated that she slept all night and has not had an episode of crying or anything today.

## 2021-06-12 NOTE — Telephone Encounter (Signed)
Spoke with pt's mother and she stated that Lindsey [name] is taking paxil twice daily and she has only been giving her 1 tablet of the diazepam in the evening. Mother stated that she has a dermatology appt tomorrow at 10:30 but should be out in time to be here by 12. Pt has been scheduled for 12 pm tomorrow but if unable to make they will give the office a call.

## 2021-06-13 ENCOUNTER — Encounter: Payer: Self-pay | Admitting: Internal Medicine

## 2021-06-23 ENCOUNTER — Other Ambulatory Visit: Payer: Self-pay

## 2021-06-23 ENCOUNTER — Ambulatory Visit (INDEPENDENT_AMBULATORY_CARE_PROVIDER_SITE_OTHER): Payer: 59 | Admitting: Internal Medicine

## 2021-06-23 ENCOUNTER — Encounter: Payer: Self-pay | Admitting: Internal Medicine

## 2021-06-23 DIAGNOSIS — G809 Cerebral palsy, unspecified: Secondary | ICD-10-CM

## 2021-06-23 DIAGNOSIS — R451 Restlessness and agitation: Secondary | ICD-10-CM

## 2021-06-23 MED ORDER — QUETIAPINE FUMARATE 50 MG PO TABS: 50.0000 mg | ORAL_TABLET | Freq: Every day | ORAL | 1 refills | Status: DC

## 2021-06-23 NOTE — Progress Notes (Signed)
Subjective:  Patient ID: Lindsey [name], female    DOB: [birth year]  Age: 36 y.o. MRN: [id number]  CC: Diagnoses of Cerebral palsy, unspecified type (HCC) and Restlessness and agitation were pertinent to this visit.   This visit occurred during the SARS-CoV-2 public health emergency.  Safety protocols were in place, including screening questions prior to the visit, additional usage of staff PPE, and extensive cleaning of exam room while observing appropriate contact time as indicated for disinfecting solutions.    HPI Lindsey [name] presents for  Chief Complaint  Patient presents with   Follow-up    ED for behavorial issues    Lindsey [name] is a 36 yr old female with macrocephaly, significant developmental delay and seizure disorder  resulting in intellectual disability who has developed recurrent agitation over the last year with self injurious behavior and uncontrolled crying.      1) Lindsey [name] was evaluated in ER on Feb 4 for uncontrollable crying and agitation.  The episodes have been occurring frequently and are aggravated by interactions with her maternal  grandfather who has dementia  and is occasionally combative and returned 4 weeks ago from rehab after breaking his hip.  (Patient and mother live with mother's parents ) .  Patient's father has some home care aides that provide 3 hrs of care total .   [name] has cried every day except one. Episodes of agitation often escalate to self pummeling,  taking clothes off.   Has an appt with [facility] in one week and appt with [facility] in April   Previous use of antipsychotics reviewed:   risperdal caused excessive sedation..Previous use of seroquel was  titrated up to 200 mg daily , stopped in 2018 due to lack of effect .  Unsure what happended with Zyprexa     Outpatient Medications Prior to Visit  Medication Sig Dispense Refill   ALPRAZolam (XANAX) 0.25 MG tablet TAKE 1 TABLET BY MOUTH 2 TIMES DAILY AS NEEDED  FOR ANXIETY. 45 tablet 5   chlorhexidine (PERIDEX) 0.12 % solution 15 mLs 2 (two) times daily.     diazepam (DIASTAT ACUDIAL) 10 MG GEL Place 20 mg rectally once.     diazepam (VALIUM) 5 MG tablet Take 1.5 tablets (7.5 mg total) by mouth every 12 (twelve) hours as needed for anxiety. 90 tablet 5   EPINEPHrine 0.3 mg/0.3 mL IJ SOAJ injection Inject 0.3 mLs (0.3 mg total) into the muscle as needed for anaphylaxis. 1 each 1   LAMICTAL 100 MG tablet Take 100 mg by mouth 2 (two) times daily.      levETIRAcetam (KEPPRA) 750 MG tablet Take 3 tablets (2,250 mg total) by mouth daily. TAKE 1 and a half pills twice a day 90 tablet 0   melatonin 5 MG TABS Take 5 mg by mouth.     PARoxetine (PAXIL) 20 MG tablet TAKE 1 TABLET BY MOUTH TWICE DAILY 180 tablet 2   traZODone (DESYREL) 50 MG tablet Take 1 tablet (50 mg total) by mouth at bedtime. 90 tablet 1   VALTOCO 20 MG DOSE 10 MG/0.1ML LQPK Place into both nostrils.     benzonatate (TESSALON) 200 MG capsule Take 1 capsule (200 mg total) by mouth 3 (three) times daily as needed for cough. (Patient not taking: Reported on 05/18/2021) 60 capsule 1   guaiFENesin-codeine (CHERATUSSIN AC) 100-10 MG/5ML syrup Take 5 mLs by mouth 3 (three) times daily as needed for cough. (Patient not taking: Reported on 05/18/2021) 120 mL 0  No facility-administered medications prior to visit.    Review of Systems;  Patient denies headache, fevers, malaise, unintentional weight loss, skin rash, eye pain, sinus congestion and sinus pain, sore throat, dysphagia,  hemoptysis , cough, dyspnea, wheezing, chest pain, palpitations, orthopnea, edema, abdominal pain, nausea, melena, diarrhea, constipation, flank pain, dysuria, hematuria, urinary  Frequency, nocturia, numbness, tingling, seizures,  Focal weakness, Loss of consciousness,  Tremor, insomnia, depression, anxiety, and suicidal ideation.      Objective:  BP 90/62 (BP Location: Right Arm, Patient Position: Sitting, Cuff Size:  Normal)    Pulse 92    Temp 97.7 F (36.5 C) (Axillary)    Ht 5\' 6"  (1.676 m)    LMP 01/09/2011    SpO2 98%    BMI 32.28 kg/m   BP Readings from Last 3 Encounters:  06/23/21 90/62  06/11/21 112/86  04/10/21 111/80    Wt Readings from Last 3 Encounters:  04/10/21 220 lb (99.8 kg)  03/16/21 220 lb (99.8 kg)  02/10/21 220 lb (99.8 kg)    General appearance: alert, cooperative and appears stated age Ears: normal TM's and external ear canals both ears Throat: lips, mucosa, and tongue normal; teeth and gums normal Neck: no adenopathy, no carotid bruit, supple, symmetrical, trachea midline and thyroid not enlarged, symmetric, no tenderness/mass/nodules Back: symmetric, no curvature. ROM normal. No CVA tenderness. Lungs: clear to auscultation bilaterally Heart: regular rate and rhythm, S1, S2 normal, no murmur, click, rub or gallop Abdomen: soft, non-tender; bowel sounds normal; no masses,  no organomegaly Pulses: 2+ and symmetric Skin: Skin color, texture, turgor normal. No rashes or lesions Lymph nodes: Cervical, supraclavicular, and axillary nodes normal.  Lab Results  Component Value Date   HGBA1C 4.9 03/16/2021    Lab Results  Component Value Date   CREATININE 0.63 06/10/2021   CREATININE 0.65 03/16/2021   CREATININE 0.64 12/21/2020    Lab Results  Component Value Date   WBC 6.4 06/10/2021   HGB 14.1 06/10/2021   HCT 42.2 06/10/2021   PLT 252 06/10/2021   GLUCOSE 98 06/10/2021   CHOL 150 03/16/2021   TRIG 103.0 03/16/2021   HDL 33.50 (L) 03/16/2021   LDLCALC 96 03/16/2021   ALT 9 06/10/2021   AST 16 06/10/2021   NA 131 (L) 06/10/2021   K 4.1 06/10/2021   CL 103 06/10/2021   CREATININE 0.63 06/10/2021   BUN 7 06/10/2021   CO2 21 (L) 06/10/2021   TSH 1.75 06/28/2020   HGBA1C 4.9 03/16/2021    No results found.  Assessment & Plan:   Problem List Items Addressed This Visit     Cerebral palsy ([place])    With intellectual disability and seizure disorder.   History of temporal lobe resection .  She remains dependent on mother for ADLs.       Restlessness and agitation    Reviewed Feb 4 ER visit and Dr Jaclyn Prime office notes from most recent visit.  REviewed prior trials of antipsychotics  And recommend repeat trial of antipsychotic.  seroquel sent to pharmacy.        I spent 30 minutes dedicated to the care of this patient on the date of this encounter to include pre-visit review of patient's medical history,  most recent imaging studies, Face-to-face time with the patient , and post visit ordering of testing and therapeutics.    Follow-up: No follow-ups on file.   Crecencio Mc, MD

## 2021-06-23 NOTE — Assessment & Plan Note (Addendum)
Reviewed Feb 4 ER visit and Dr Esmond Harps office notes from most recent visit.  REviewed prior trials of antipsychotics  And recommend repeat trial of antipsychotic.  seroquel sent to pharmacy.

## 2021-06-23 NOTE — Assessment & Plan Note (Signed)
With intellectual disability and seizure disorder.  History of temporal lobe resection .  She remains dependent on mother for ADLs.

## 2021-06-23 NOTE — Patient Instructions (Signed)
YOU CAN START THE SEROQUEL IF YOU FEEL SHE NEEDS IT PRIOR TO HER APPT NEXT WEEK

## 2021-07-05 ENCOUNTER — Ambulatory Visit
Admission: RE | Admit: 2021-07-05 | Discharge: 2021-07-05 | Disposition: A | Discharge status: Home / Self Care | Payer: 59 | Source: Ambulatory Visit | Attending: Internal Medicine | Admitting: Internal Medicine

## 2021-07-05 ENCOUNTER — Other Ambulatory Visit: Payer: Self-pay

## 2021-07-05 VITALS — BP 94/69 | HR 86 | Temp 97.6°F | Resp 18

## 2021-07-05 DIAGNOSIS — R0981 Nasal congestion: Secondary | ICD-10-CM | POA: Diagnosis not present

## 2021-07-05 DIAGNOSIS — R051 Acute cough: Secondary | ICD-10-CM

## 2021-07-05 DIAGNOSIS — J302 Other seasonal allergic rhinitis: Secondary | ICD-10-CM | POA: Diagnosis not present

## 2021-07-05 NOTE — Discharge Instructions (Signed)
I do not see any signs of infection that would warrant a starting antibiotics today.  I believe symptoms are related to allergies.  Please start Claritin over-the-counter as we discussed.  Use a humidifier for additional symptom relief.  If you decide you are interested in viral testing please return for reevaluation.  If symptoms are not improving within a few days please return or see PCP.  If anything worsens suddenly she needs to be seen immediately. ?

## 2021-07-05 NOTE — ED Triage Notes (Signed)
Pt presents with cough and runny nose since last night.  ?

## 2021-07-05 NOTE — ED Provider Notes (Signed)
[name]    CSN: [id number] Arrival date & time: 07/05/21  0920      History   Chief Complaint Chief Complaint  Patient presents with   Cough   Nasal Congestion   Appointment    HPI Lindsey [name] is a 36 y.o. female.   Patient presents today accompanied by mother who provides majority of history.  Patient has a history of cerebral palsy and mother provided history.  Reports a 1 day history of nasal congestion and cough.  Denies additional symptoms including fever, nausea, vomiting, diarrhea, chest pain, shortness of breath.  She has been given Coricidin HBP with temporary improvement of symptoms.  Denies any known sick contacts.  She does have a history of allergies but has not started antihistamines on a regular basis; unable to take Benadryl or Zyrtec due to increased seizure activity.  Denies any history of smoking, asthma.  She has had COVID several months ago and is confident that this is not COVID or another virus.  Denies any recent antibiotics.   Past Medical History:  Diagnosis Date   COVID-19 12/2020   Epilepsy [facility]) age 37   negative metabolic workup has brain abnormalities by MRI (Dr. 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/07/1992   Right    Patient Active Problem List   Diagnosis Date Noted   Obesity (BMI 35.0-39.9 without comorbidity) 03/16/2021   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   Drug-induced leukopenia ([place]) 06/29/2020   Change in behavior 05/04/2020   Moderate intellectual disability 05/04/2020   Urticarial rash 10/30/2019   Right facial swelling 10/30/2019   Poor dentition 09/02/2017   Constipation 11/10/2015   Long-term use of high-risk medication 10/11/2015   Xerosis of skin 12/12/2014   Restless legs syndrome 07/12/2014   Restlessness and agitation 09/10/2012   Routine  general medical examination at a health care facility 06/08/2012   Cerebral palsy ([place]) 05/08/2012   Acneiform dermatitis 06/30/2011   Partial epilepsy with intractable epilepsy ([place]) 03/23/2011    Past Surgical History:  Procedure Laterality Date   ABDOMINAL HYSTERECTOMY  2005   BRAIN SURGERY     Temporal Lobe resection   CRANIOTOMY FOR TEMPORAL LOBECTOMY     TONSILLECTOMY AND ADENOIDECTOMY  age 82    OB History   No obstetric history on file.      Home Medications    Prior to Admission medications   Medication Sig Start Date End Date Taking? Authorizing Provider  ALPRAZolam (XANAX) 0.25 MG tablet TAKE 1 TABLET BY MOUTH 2 TIMES DAILY AS NEEDED FOR ANXIETY. 04/18/21   Crecencio Mc, MD  chlorhexidine (PERIDEX) 0.12 % solution 15 mLs 2 (two) times daily. 02/10/20   [provider]  diazepam (DIASTAT ACUDIAL) 10 MG GEL Place 20 mg rectally once.    [provider]  diazepam (VALIUM) 5 MG tablet Take 1.5 tablets (7.5 mg total) by mouth every 12 (twelve) hours as needed for anxiety. 03/28/21   Crecencio Mc, MD  EPINEPHrine 0.3 mg/0.3 mL IJ SOAJ injection Inject 0.3 mLs (0.3 mg total) into the muscle as needed for anaphylaxis. 12/30/19   Crecencio Mc, MD  LAMICTAL 100 MG tablet Take 100 mg by mouth 2 (two) times daily.  08/03/16   [provider]  levETIRAcetam (KEPPRA) 750 MG tablet Take 3 tablets (2,250 mg  total) by mouth daily. TAKE 1 and a half pills twice a day 04/12/20   Clapacs, John T, MD  melatonin 5 MG TABS Take 5 mg by mouth.    [provider]  PARoxetine (PAXIL) 20 MG tablet TAKE 1 TABLET BY MOUTH TWICE DAILY 01/06/21   Crecencio Mc, MD  QUEtiapine (SEROQUEL) 50 MG tablet Take 1 tablet (50 mg total) by mouth at bedtime. 06/23/21   Crecencio Mc, MD  traZODone (DESYREL) 50 MG tablet Take 1 tablet (50 mg total) by mouth at bedtime. 06/12/21   Crecencio Mc, MD  VALTOCO 20 MG DOSE 10 MG/0.1ML LQPK Place into both nostrils. 12/12/20    [provider]    Family History History reviewed. No pertinent family history.  Social History Social History   Tobacco Use   Smoking status: Never   Smokeless tobacco: Never  Vaping Use   Vaping Use: Never used  Substance Use Topics   Alcohol use: No    Alcohol/week: 0.0 standard drinks   Drug use: No     Allergies   Other, Influenza vac split quad, Benadryl [diphenhydramine hcl], Montelukast, Ropinirole, Zyrtec [cetirizine hcl], and Peanut-containing drug products   Review of Systems Review of Systems  Constitutional:  Negative for activity change, appetite change, fatigue and fever.  HENT:  Positive for congestion. Negative for sinus pressure, sneezing and sore throat.   Respiratory:  Positive for cough. Negative for shortness of breath.   Cardiovascular:  Negative for chest pain.  Gastrointestinal:  Negative for abdominal pain, diarrhea, nausea and vomiting.  Musculoskeletal:  Negative for arthralgias and myalgias.  Neurological:  Negative for dizziness, light-headedness and headaches.    Physical Exam Triage Vital Signs ED Triage Vitals [07/05/21 0950]  Enc Vitals Group     BP 94/69     Pulse Rate 86     Resp 18     Temp 97.6 F (36.4 C)     Temp Source Axillary     SpO2 97 %     Weight      Height      Head Circumference      Peak Flow      Pain Score      Pain Loc      Pain Edu?      Excl. in [place]?    No data found.  Updated Vital Signs BP 94/69 (BP Location: Left Arm)    Pulse 86    Temp 97.6 F (36.4 C) (Axillary)    Resp 18    LMP 01/09/2011    SpO2 97%   Visual Acuity Right Eye Distance:   Left Eye Distance:   Bilateral Distance:    Right Eye Near:   Left Eye Near:    Bilateral Near:     Physical Exam Vitals reviewed.  Constitutional:      General: She is awake. She is not in acute distress.    Appearance: Normal appearance. She is well-developed. She is not ill-appearing.     Comments: Very pleasant female appears stated  age holding her baby doll sitting in the exam room in no acute distress  HENT:     Head: Normocephalic and atraumatic.     Right Ear: Tympanic membrane, ear canal and external ear normal. Tympanic membrane is not erythematous or bulging.     Left Ear: Tympanic membrane, ear canal and external ear normal. Tympanic membrane is not erythematous or bulging.     Nose:  Right Sinus: No maxillary sinus tenderness or frontal sinus tenderness.     Left Sinus: No maxillary sinus tenderness or frontal sinus tenderness.     Mouth/Throat:     Pharynx: Uvula midline. Posterior oropharyngeal erythema present. No oropharyngeal exudate.     Comments: Mild erythema and drainage in posterior oropharynx Cardiovascular:     Rate and Rhythm: Normal rate and regular rhythm.     Heart sounds: Normal heart sounds, S1 normal and S2 normal. No murmur heard. Pulmonary:     Effort: Pulmonary effort is normal.     Breath sounds: Normal breath sounds. No wheezing, rhonchi or rales.     Comments: Clear to auscultation bilaterally Psychiatric:        Behavior: Behavior is cooperative.     UC Treatments / Results  Labs (all labs ordered are listed, but only abnormal results are displayed) Labs Reviewed - No data to display  EKG   Radiology No results found.  Procedures Procedures (including critical care time)  Medications Ordered in UC Medications - No data to display  Initial Impression / Assessment and Plan / UC Course  I have reviewed the triage vital signs and the nursing notes.  Pertinent labs & imaging results that were available during my care of the patient were reviewed by me and considered in my medical decision making (see chart for details).     Strongly suspect seasonal allergies as etiology of symptoms.  Offered viral testing but patient mother declined this as she has low concern for COVID-19 or other viral illnesses.  No obvious secondary bacterial infection that would warrant  initiation of antibiotics today.  Recommended conservative treatment options and mother reports she will begin Claritin daily to manage congestion symptoms.  She can use humidifier for additional symptom relief.  Discussed that if she has any worsening symptoms she should return for reevaluation.  If symptoms or not improving by next week she should be seen here or with her PCP.  Strict return precautions given to which mother expressed understanding.  Final Clinical Impressions(s) / UC Diagnoses   Final diagnoses:  Nasal congestion  Acute cough  Seasonal allergies     Discharge Instructions      I do not see any signs of infection that would warrant a starting antibiotics today.  I believe symptoms are related to allergies.  Please start Claritin over-the-counter as we discussed.  Use a humidifier for additional symptom relief.  If you decide you are interested in viral testing please return for reevaluation.  If symptoms are not improving within a few days please return or see PCP.  If anything worsens suddenly she needs to be seen immediately.     ED Prescriptions   None    PDMP not reviewed this encounter.   Terrilee Croak, PA-C 07/05/21 1005

## 2021-07-06 ENCOUNTER — Telehealth: Payer: Self-pay | Admitting: Internal Medicine

## 2021-07-06 NOTE — Telephone Encounter (Signed)
LMTCB. Pt will need to be scheduled for an appt for evaluation in order to be prescribed antibiotics.  ?

## 2021-07-06 NOTE — Telephone Encounter (Signed)
Patient's mother called and said she took her daughter to [facility] yesterday. Patient is coughing a lot, she was told that her lungs are clear. Mother is requesting a z pack and something for the cough. Patient is going out of town next week and doesn't want her to get worse. ?

## 2021-07-07 ENCOUNTER — Telehealth: Payer: Self-pay | Admitting: Internal Medicine

## 2021-07-07 NOTE — Telephone Encounter (Signed)
Sherri from [facility] called in stating that Dr. Derrel Nip prescribe medication (QUEtiapine (SEROQUEL) 50 MG tablet) for pt. Sherri stated that they try to run both insurance for the medication and medicaid insurance came back stating there is no safety documentation hasn't been file. Sherri stated that it looks like a PA is need but the system is not showing. Sherri stated that the [employer] is not show the safety document in the system. Sherri stated the number that needs to be called is [phone]  ?

## 2021-07-07 NOTE — Telephone Encounter (Signed)
Safety documentation/PA has been submitted and approved through 07/02/2022. Pharmacy is aware.  ?

## 2021-07-07 NOTE — Telephone Encounter (Signed)
FYI

## 2021-07-07 NOTE — Telephone Encounter (Signed)
Pt mom called in stating that pt went to a doctor appt today at [facility]. Pt mom stated that provider Theresa Mulligan will be reaching out to Dr. Darrick Huntsman about the medications that pt is on. Pt mom just wanted to inform Dr. Darrick Huntsman about the information  ?

## 2021-07-10 ENCOUNTER — Ambulatory Visit (INDEPENDENT_AMBULATORY_CARE_PROVIDER_SITE_OTHER): Payer: 59 | Admitting: Internal Medicine

## 2021-07-10 ENCOUNTER — Other Ambulatory Visit: Payer: Self-pay

## 2021-07-10 ENCOUNTER — Encounter: Payer: Self-pay | Admitting: Internal Medicine

## 2021-07-10 DIAGNOSIS — E871 Hypo-osmolality and hyponatremia: Secondary | ICD-10-CM | POA: Insufficient documentation

## 2021-07-10 DIAGNOSIS — R441 Visual hallucinations: Secondary | ICD-10-CM | POA: Diagnosis not present

## 2021-07-10 DIAGNOSIS — R451 Restlessness and agitation: Secondary | ICD-10-CM | POA: Diagnosis not present

## 2021-07-10 DIAGNOSIS — R4689 Other symptoms and signs involving appearance and behavior: Secondary | ICD-10-CM

## 2021-07-10 DIAGNOSIS — J301 Allergic rhinitis due to pollen: Secondary | ICD-10-CM | POA: Diagnosis not present

## 2021-07-10 MED ORDER — AMOXICILLIN-POT CLAVULANATE 875-125 MG PO TABS: 1.0000 | ORAL_TABLET | Freq: Two times a day (BID) | ORAL | 0 refills | Status: DC

## 2021-07-10 MED ORDER — QUETIAPINE FUMARATE 25 MG PO TABS: 25.0000 mg | ORAL_TABLET | Freq: Two times a day (BID) | ORAL | 1 refills | Status: DC

## 2021-07-10 NOTE — Assessment & Plan Note (Addendum)
Intermittent, asymptomatic.  Probable medication side effect (paxil) vs decreased oral intake.  .  Advised to increase sodium intake  ?

## 2021-07-10 NOTE — Assessment & Plan Note (Addendum)
continue seroquel at 25 mg bid instead of using 50 g qhs and alprazolam bid.  ?

## 2021-07-10 NOTE — Patient Instructions (Addendum)
I agree with the New regimen suggested by [facility]: ? ?Give Symia 25 mg quetiapine (seroquel) every 12 hours.   ? ?Reduce alprazolam to " as needed" for agitation  ? ?I recommend that you increase claritin  to two times daily  for the allergies  ? ?Use delsym as needed ? ?Take the augmentin with you and have her start it If she starts acting like she has an ear infection  ? ?And take a probiotic if the augmentin is started ? ? ?Increase Adelin's salt intake ( as tolerated) ?

## 2021-07-10 NOTE — Assessment & Plan Note (Signed)
Secondary to conflict at home with grandfather who is demented and adversarial at times.  ?

## 2021-07-10 NOTE — Progress Notes (Signed)
? ?Subjective:  ?Patient ID: Lindsey [name], female    DOB: 31-Aug-1985  Age: 36 y.o. MRN: [id number] ? ?CC: Diagnoses of Hyponatremia, Hallucinations, visual, Restlessness and agitation, Allergic rhinitis due to grass pollen, and Change in behavior were pertinent to this visit. ? ? ?This visit occurred during the SARS-CoV-2 public health emergency.  Safety protocols were in place, including screening questions prior to the visit, additional usage of staff PPE, and extensive cleaning of exam room while observing appropriate contact time as indicated for disinfecting solutions.   ? ?HPI ?Lindsey [name] presents for  ?Chief Complaint  ?Patient presents with  ? Nasal Congestion  ?  Per UC patient is currently taking Claritin & Delsym for cough.  ? Cough  ? ? ?Evaluated  at Urgent Care on March 1 for URI sympoms and allergic rhinitis diagnosed.  Claritin started for rhinitis and Delsym for the cough.  Family going to [place] in the near future.  Mother wanted to make sure she wasn't contagious,  but declined COVID testing at urgent care  ? ?Agitation:  saw [facility] last Friday .  Has not started the seroquel prescribed in February ,  started it Friday at 1/2 tablet  in the evening.  Mother notes that she was not overly sedated in the morning with regimen of alprazolam 7 am and 7 pm  Advised to reduce the Seroquel dose at night and use 1/2 tablet in the am .advised to reduce twice daily of alprazolam  ? ? ? ? ?Outpatient Medications Prior to Visit  ?Medication Sig Dispense Refill  ? ALPRAZolam (XANAX) 0.25 MG tablet TAKE 1 TABLET BY MOUTH 2 TIMES DAILY AS NEEDED FOR ANXIETY. 45 tablet 5  ? chlorhexidine (PERIDEX) 0.12 % solution 15 mLs 2 (two) times daily.    ? diazepam (DIASTAT ACUDIAL) 10 MG GEL Place 20 mg rectally once.    ? diazepam (VALIUM) 5 MG tablet Take 1.5 tablets (7.5 mg total) by mouth every 12 (twelve) hours as needed for anxiety. 90 tablet 5  ? EPINEPHrine 0.3 mg/0.3 mL IJ SOAJ injection  Inject 0.3 mLs (0.3 mg total) into the muscle as needed for anaphylaxis. 1 each 1  ? LAMICTAL 100 MG tablet Take 100 mg by mouth 2 (two) times daily.     ? levETIRAcetam (KEPPRA) 750 MG tablet Take 3 tablets (2,250 mg total) by mouth daily. TAKE 1 and a half pills twice a day 90 tablet 0  ? melatonin 5 MG TABS Take 5 mg by mouth.    ? PARoxetine (PAXIL) 20 MG tablet TAKE 1 TABLET BY MOUTH TWICE DAILY 180 tablet 2  ? traZODone (DESYREL) 50 MG tablet Take 1 tablet (50 mg total) by mouth at bedtime. 90 tablet 1  ? VALTOCO 20 MG DOSE 10 MG/0.1ML LQPK Place into both nostrils.    ? QUEtiapine (SEROQUEL) 50 MG tablet Take 1 tablet (50 mg total) by mouth at bedtime. 90 tablet 1  ? ?No facility-administered medications prior to visit.  ? ? ?Review of Systems; ? ?Patient denies headache, fevers, malaise, unintentional weight loss, skin rash, eye pain, sinus congestion and sinus pain, sore throat, dysphagia,  hemoptysis , cough, dyspnea, wheezing, chest pain, palpitations, orthopnea, edema, abdominal pain, nausea, melena, diarrhea, constipation, flank pain, dysuria, hematuria, urinary  Frequency, nocturia, numbness, tingling, seizures,  Focal weakness, Loss of consciousness,  Tremor, insomnia, depression, anxiety, and suicidal ideation.   ? ? ? ?Objective:  ?BP 90/62 (BP Location: Left Arm, Patient Position: Sitting, Cuff  Size: Normal)   Pulse 90   Temp 98.2 ?F (36.8 ?C) (Axillary)   Ht 5\' 6"  (1.676 m)   LMP 01/09/2011   SpO2 96%   BMI 32.28 kg/m?  ? ?BP Readings from Last 3 Encounters:  ?07/10/21 90/62  ?07/05/21 94/69  ?06/23/21 90/62  ? ? ?Wt Readings from Last 3 Encounters:  ?04/10/21 220 lb (99.8 kg)  ?03/16/21 220 lb (99.8 kg)  ?02/10/21 220 lb (99.8 kg)  ? ? ?General appearance: alert, cooperative and appears stated age ?Ears: normal TM's and external ear canals both ears ?Throat: lips, mucosa, and tongue normal; teeth and gums normal ?Neck: no adenopathy, no carotid bruit, supple, symmetrical, trachea midline  and thyroid not enlarged, symmetric, no tenderness/mass/nodules ?Back: symmetric, no curvature. ROM normal. No CVA tenderness. ?Lungs: clear to auscultation bilaterally ?Heart: regular rate and rhythm, S1, S2 normal, no murmur, click, rub or gallop ?Abdomen: soft, non-tender; bowel sounds normal; no masses,  no organomegaly ?Pulses: 2+ and symmetric ?Skin: Skin color, texture, turgor normal. No rashes or lesions ?Lymph nodes: Cervical, supraclavicular, and axillary nodes normal. ? ?Lab Results  ?Component Value Date  ? HGBA1C 4.9 03/16/2021  ? ? ?Lab Results  ?Component Value Date  ? CREATININE 0.63 06/10/2021  ? CREATININE 0.65 03/16/2021  ? CREATININE 0.64 12/21/2020  ? ? ?Lab Results  ?Component Value Date  ? WBC 6.4 06/10/2021  ? HGB 14.1 06/10/2021  ? HCT 42.2 06/10/2021  ? PLT 252 06/10/2021  ? GLUCOSE 98 06/10/2021  ? CHOL 150 03/16/2021  ? TRIG 103.0 03/16/2021  ? HDL 33.50 (L) 03/16/2021  ? Cache 96 03/16/2021  ? ALT 9 06/10/2021  ? AST 16 06/10/2021  ? NA 131 (L) 06/10/2021  ? K 4.1 06/10/2021  ? CL 103 06/10/2021  ? CREATININE 0.63 06/10/2021  ? BUN 7 06/10/2021  ? CO2 21 (L) 06/10/2021  ? TSH 1.75 06/28/2020  ? HGBA1C 4.9 03/16/2021  ? ? ?No results found. ? ?Assessment & Plan:  ? ?Problem List Items Addressed This Visit   ? ? Allergic rhinitis due to grass pollen  ?  Suggested by history and exam/  Advised to continue claritin daily/twice daily. She has a high likelihood of developing otitis from sinus congestion given her intellectual disability preventing use of decongestants and saline irrigation; augmentin given to mother to use if she develops signs or symptoms during trip to [facility] and probiotic use advised  ?  ?  ? Change in behavior  ?  Secondary to conflict at home with grandfather who is demented and adversarial at times.  ?  ?  ? Hallucinations, visual  ?  Occurring during periods of agitation.  She was evaluated last week by [employer] Seroquel dose reduced to 25 mg bid.  alprazolam reduced to prn  ?  ?  ? Hyponatremia  ?  Intermittent, asymptomatic.  Probable medication side effect (paxil) vs decreased oral intake.  .  Advised to increase sodium intake  ?  ?  ? Restlessness and agitation  ?  continue seroquel at 25 mg bid instead of using 50 g qhs and alprazolam bid.  ?  ?  ? ? ?I spent 30 minutes dedicated to the care of this patient on the date of this encounter to include pre-visit review of patient's medical history,  most recent imaging studies, Face-to-face time with the patient , and post visit ordering of testing and therapeutics.   ? ?Follow-up: No follow-ups on file. ? ? ?Crecencio Mc,  MD ?

## 2021-07-10 NOTE — Assessment & Plan Note (Signed)
Suggested by history and exam/  Advised to continue claritin daily/twice daily. She has a high likelihood of developing otitis from sinus congestion given her intellectual disability preventing use of decongestants and saline irrigation; augmentin given to mother to use if she develops signs or symptoms during trip to [facility] and probiotic use advised  ?

## 2021-07-10 NOTE — Assessment & Plan Note (Signed)
Occurring during periods of agitation.  She was evaluated last week by [employer]. Seroquel dose reduced to 25 mg bid. alprazolam reduced to prn  ?

## 2021-08-14 ENCOUNTER — Other Ambulatory Visit: Payer: Self-pay

## 2021-08-14 MED ORDER — EPINEPHRINE 0.3 MG/0.3ML IJ SOAJ: 0.3000 mg | INTRAMUSCULAR | 1 refills | Status: DC | PRN

## 2021-10-20 ENCOUNTER — Other Ambulatory Visit: Payer: Self-pay

## 2021-10-20 ENCOUNTER — Other Ambulatory Visit: Payer: Self-pay | Admitting: Internal Medicine

## 2021-10-20 DIAGNOSIS — R451 Restlessness and agitation: Secondary | ICD-10-CM

## 2021-10-20 MED ORDER — DIAZEPAM 5 MG PO TABS: ORAL_TABLET | ORAL | 5 refills | Status: DC

## 2021-10-20 MED ORDER — TRAZODONE HCL 50 MG PO TABS: 25.0000 mg | ORAL_TABLET | Freq: Every day | ORAL | 1 refills | Status: DC

## 2021-10-20 MED ORDER — QUETIAPINE FUMARATE 25 MG PO TABS: 25.0000 mg | ORAL_TABLET | Freq: Two times a day (BID) | ORAL | 1 refills | Status: DC

## 2021-10-20 MED ORDER — PAROXETINE HCL 20 MG PO TABS: 20.0000 mg | ORAL_TABLET | Freq: Two times a day (BID) | ORAL | 3 refills | Status: DC

## 2021-10-24 ENCOUNTER — Other Ambulatory Visit: Payer: Self-pay | Admitting: Internal Medicine

## 2021-12-07 ENCOUNTER — Other Ambulatory Visit: Payer: Self-pay | Admitting: Internal Medicine

## 2022-01-02 ENCOUNTER — Other Ambulatory Visit: Payer: Self-pay

## 2022-01-02 ENCOUNTER — Emergency Department: Payer: 59

## 2022-01-02 ENCOUNTER — Emergency Department
Admission: EM | Admit: 2022-01-02 | Discharge: 2022-01-02 | Disposition: A | Discharge status: Home / Self Care | Payer: 59 | Attending: Emergency Medicine | Admitting: Emergency Medicine

## 2022-01-02 DIAGNOSIS — W010XXA Fall on same level from slipping, tripping and stumbling without subsequent striking against object, initial encounter: Secondary | ICD-10-CM | POA: Insufficient documentation

## 2022-01-02 DIAGNOSIS — Z91010 Allergy to peanuts: Secondary | ICD-10-CM | POA: Diagnosis not present

## 2022-01-02 DIAGNOSIS — S0990XA Unspecified injury of head, initial encounter: Secondary | ICD-10-CM | POA: Diagnosis present

## 2022-01-02 DIAGNOSIS — S0181XA Laceration without foreign body of other part of head, initial encounter: Secondary | ICD-10-CM | POA: Insufficient documentation

## 2022-01-02 NOTE — ED Triage Notes (Signed)
Pt comes with c/o trip and fall. Pt has abrasion to face. No loc

## 2022-01-02 NOTE — Discharge Instructions (Signed)
Please laceration site clean and dry for 1 week.  In 1 week you may shower and get wet and allow Dermabond to come off on its own.  If patient is picking at the Dermabond please cover with a Band-Aid.  Return for any increase in swelling, complaints of headaches, nausea vomiting, warmth redness or any urgent changes in your health

## 2022-01-02 NOTE — ED Provider Notes (Signed)
[facility] EMERGENCY DEPARTMENT Provider Note   CSN: [id number] Arrival date & time: 01/02/22  1621     History  Chief Complaint  Patient presents with   Lindsey [name]    Lindsey [name] is a 36 y.o. female.  With history of cerebral palsy and intellectual disability presents to the emergency department for evaluation of a fall.  Patient tripped and fell into the door frame, injured her right temporal region, has some mild soft tissue swelling just along the right orbital rim with a small 2 cm laceration.  No LOC, vomiting.  Patient is difficult historian.  She has been moving and acting like normal per mother.  Patient denies any other pain or injury to her body.  HPI     Home Medications Prior to Admission medications   Medication Sig Start Date End Date Taking? Authorizing Provider  ALPRAZolam (XANAX) 0.25 MG tablet TAKE 1 TABLET BY MOUTH 2 TIMES DAILY AS NEEDED FOR ANXIETY. 12/07/21   Sherlene Shams, MD  amoxicillin-clavulanate (AUGMENTIN) 875-125 MG tablet Take 1 tablet by mouth 2 (two) times daily. 07/10/21   Sherlene Shams, MD  chlorhexidine (PERIDEX) 0.12 % solution 15 mLs 2 (two) times daily. 02/10/20   [provider]  diazepam (DIASTAT ACUDIAL) 10 MG GEL Place 20 mg rectally once.    [provider]  diazepam (VALIUM) 5 MG tablet 1/2 tablet in the morning,   1 tablet in the afternoon 10/20/21   Sherlene Shams, MD  EPINEPHrine 0.3 mg/0.3 mL IJ SOAJ injection Inject 0.3 mg into the muscle as needed for anaphylaxis. 08/14/21   Sherlene Shams, MD  LAMICTAL 100 MG tablet Take 100 mg by mouth 2 (two) times daily.  08/03/16   [provider]  levETIRAcetam (KEPPRA) 750 MG tablet Take 3 tablets (2,250 mg total) by mouth daily. TAKE 1 and a half pills twice a day 04/12/20   Clapacs, John T, MD  melatonin 5 MG TABS Take 5 mg by mouth.    [provider]  PARoxetine (PAXIL) 20 MG tablet Take 1 tablet (20 mg total) by mouth 2 (two) times  daily. 10/20/21   Sherlene Shams, MD  QUEtiapine (SEROQUEL) 25 MG tablet Take 1 tablet (25 mg total) by mouth 2 (two) times daily. 10/20/21   Sherlene Shams, MD  traZODone (DESYREL) 50 MG tablet Take 0.5 tablets (25 mg total) by mouth at bedtime. 10/20/21   Sherlene Shams, MD  VALTOCO 20 MG DOSE 10 MG/0.1ML LQPK Place into both nostrils. 12/12/20   [provider]      Allergies    Other, Influenza vac split quad, Benadryl [diphenhydramine hcl], Montelukast, Ropinirole, Zyrtec [cetirizine hcl], and Peanut-containing drug products    Review of Systems   Review of Systems  Physical Exam Updated Vital Signs BP 103/76   Pulse 90   Temp 98 F (36.7 C)   Resp 18   Ht 5\' 6"  (1.676 m)   Wt 90.7 kg   LMP 01/09/2011   SpO2 97%   BMI 32.27 kg/m  Physical Exam Constitutional:      Appearance: She is well-developed.  HENT:     Head: Normocephalic and atraumatic.     Right Ear: External ear normal.     Left Ear: External ear normal.     Nose: Nose normal.  Eyes:     Conjunctiva/sclera: Conjunctivae normal.     Pupils: Pupils are equal, round, and reactive to light.  Cardiovascular:  Rate and Rhythm: Normal rate.  Pulmonary:     Effort: Pulmonary effort is normal. No respiratory distress.     Breath sounds: Normal breath sounds.  Abdominal:     General: There is no distension.     Palpations: Abdomen is soft.     Tenderness: There is no abdominal tenderness.  Musculoskeletal:        General: Tenderness present. No swelling or deformity. Normal range of motion.     Cervical back: Normal range of motion.     Comments: Minimal tenderness right temporal region with 2 cm laceration, mild soft tissue swelling.  No other facial injuries.  No cervical thoracic or lumbar spinous process tenderness.  No tenderness to palpation along the shoulders elbows hips knees ankle  Skin:    General: Skin is warm and dry.     Findings: No rash.  Neurological:     Mental Status: She is alert  and oriented to person, place, and time.     Cranial Nerves: No cranial nerve deficit.     Coordination: Coordination normal.  Psychiatric:        Behavior: Behavior normal.     ED Results / Procedures / Treatments   Labs (all labs ordered are listed, but only abnormal results are displayed) Labs Reviewed - No data to display  EKG None  Radiology CT HEAD WO CONTRAST ( )  Result Date: 01/02/2022 CLINICAL DATA:  fall, facial abrasion EXAM: CT HEAD WITHOUT CONTRAST TECHNIQUE: Contiguous axial images were obtained from the base of the skull through the vertex without intravenous contrast. RADIATION DOSE REDUCTION: This exam was performed according to the departmental dose-optimization program which includes automated exposure control, adjustment of the mA and/or kV according to patient size and/or use of iterative reconstruction technique. COMPARISON:  04/10/2021 FINDINGS: Brain: Previous right temporal lobe resection. No evidence of acute infarction, hemorrhage, hydrocephalus, extra-axial mass or mass effect. Vascular: No hyperdense vessel or unexpected calcification. Skull: Old right temporal craniotomy . No fracture or other acute finding. Sinuses/Orbits: No acute finding. Other: None IMPRESSION: 1. No acute findings. 2. Stable changes of remote right temporal lobe resection. Electronically Signed   By: Corlis Leak M.D.   On: 01/02/2022 17:14   CT Maxillofacial Wo Contrast  Result Date: 01/02/2022 CLINICAL DATA:  Trip and fall.  Abrasion to face. EXAM: CT none 24 hour I 131 uptake = % (normal 10-30%) TECHNIQUE: Multidetector CT imaging of the maxillofacial structures was performed. Multiplanar CT image reconstructions were also generated. RADIATION DOSE REDUCTION: This exam was performed according to the departmental dose-optimization program which includes automated exposure control, adjustment of the mA and/or kV according to patient size and/or use of iterative reconstruction technique.  COMPARISON:  None Available. FINDINGS: Osseous: No fracture or mandibular dislocation. No destructive process. Orbits: Negative. No traumatic or inflammatory finding. Sinuses: Clear. Soft tissues: Negative. Limited intracranial: Encephalomalacia in the middle cranial fossa. IMPRESSION: No facial bone fracture. Electronically Signed   By: Genevive Bi M.D.   On: 01/02/2022 17:13   CT Cervical Spine Wo Contrast  Result Date: 01/02/2022 CLINICAL DATA:  Tripped and fall, facial abrasions EXAM: CT CERVICAL SPINE WITHOUT CONTRAST TECHNIQUE: Multidetector CT imaging of the cervical spine was performed without intravenous contrast. Multiplanar CT image reconstructions were also generated. RADIATION DOSE REDUCTION: This exam was performed according to the departmental dose-optimization program which includes automated exposure control, adjustment of the mA and/or kV according to patient size and/or use of iterative reconstruction technique. COMPARISON:  04/10/2021 FINDINGS:  Alignment: Alignment is grossly anatomic. Skull base and vertebrae: No acute fracture. No primary bone lesion or focal pathologic process. Soft tissues and spinal canal: No prevertebral fluid or swelling. No visible canal hematoma. Likely vagus nerve stimulator within the left neck. Disc levels: No significant spondylosis. Mild facet hypertrophic changes at C2-3 and C3-4 unchanged. Upper chest: Central airway is patent.  Lung apices are clear. Other: Reconstructed images demonstrate no additional findings. IMPRESSION: 1. No acute cervical spine fracture. Electronically Signed   By: Sharlet Salina M.D.   On: 01/02/2022 17:11    Procedures .Marland KitchenLaceration Repair  Date/Time: 01/02/2022 6:26 PM  Performed by: Evon Slack, PA-C Authorized by: Evon Slack, PA-C   Consent:    Consent obtained:  Verbal   Consent given by:  Patient Laceration details:    Location:  Face   Face location:  R eyebrow   Length (cm):  2   Depth (mm):   2 Exploration:    Wound exploration: wound explored through full range of motion     Contaminated: no   Treatment:    Area cleansed with:  Povidone-iodine   Amount of cleaning:  Standard   Irrigation solution:  Sterile saline   Irrigation method:  Tap Skin repair:    Repair method:  Tissue adhesive Approximation:    Approximation:  Close Repair type:    Repair type:  Simple Post-procedure details:    Dressing:  Open (no dressing)     Medications Ordered in ED Medications - No data to display  ED Course/ Medical Decision Making/ A&P                           Medical Decision Making Amount and/or Complexity of Data Reviewed Radiology: ordered.   36 year old female with right facial laceration.  Mild trauma after mechanical fall.  She hit her right side of her face against the door.  Patient suffered a small laceration.  CT of the head neck and maxillofacial area negative.  No complaints of any other pain throughout her body.  No palpable discomfort on exam throughout upper and lower extremities.  Patient neurologically intact and at baseline per mom.  Patient's laceration thoroughly cleansed and repaired with Dermabond.  They are educated on wound care and signs symptoms return to ER for. Final Clinical Impression(s) / ED Diagnoses Final diagnoses:  Laceration of skin of face, initial encounter    Rx / DC Orders ED Discharge Orders     None         Ronnette Juniper 01/02/22 Verlon Setting, MD 01/02/22 [phone]

## 2022-02-05 ENCOUNTER — Encounter: Payer: Self-pay | Admitting: Internal Medicine

## 2022-02-08 ENCOUNTER — Encounter: Payer: Self-pay | Admitting: Internal Medicine

## 2022-02-09 ENCOUNTER — Ambulatory Visit (INDEPENDENT_AMBULATORY_CARE_PROVIDER_SITE_OTHER): Payer: 59 | Admitting: Internal Medicine

## 2022-02-09 ENCOUNTER — Encounter: Payer: Self-pay | Admitting: Internal Medicine

## 2022-02-09 VITALS — Ht 66.0 in | Wt 199.0 lb

## 2022-02-09 DIAGNOSIS — E785 Hyperlipidemia, unspecified: Secondary | ICD-10-CM

## 2022-02-09 DIAGNOSIS — K5900 Constipation, unspecified: Secondary | ICD-10-CM | POA: Diagnosis not present

## 2022-02-09 DIAGNOSIS — Z79899 Other long term (current) drug therapy: Secondary | ICD-10-CM | POA: Diagnosis not present

## 2022-02-09 DIAGNOSIS — R040 Epistaxis: Secondary | ICD-10-CM

## 2022-02-09 NOTE — Assessment & Plan Note (Addendum)
improved with daily use of  neosporin

## 2022-02-09 NOTE — Assessment & Plan Note (Addendum)
Recommend daily use of stool softener and metamucil to avoid laxative dependence.  Advised mother to increase water intake and intake of salads, vegetables.

## 2022-02-09 NOTE — Progress Notes (Signed)
Virtual Visit via [place]   Note    This format is felt to be most appropriate for this patient at this time.  All issues noted in this document were discussed and addressed.  No physical exam was performed (except for noted visual exam findings with Video Visits).   I connected with Lindsey [name] and her mother Lindsey [name]  on 02/09/22 at  8:30 AM EDT by a video enabled telemedicine application  and verified that I am speaking with the correct person using two identifiers. Location patient: home Location provider: work or home office Persons participating in the virtual visit: patient, provider and mother Lindsey [name]   I discussed the limitations, risks, security and privacy concerns of performing an evaluation and management service by telephone and the availability of in person appointments. I also discussed with the patient that there may be a patient responsible charge related to this service. The patient expressed understanding and agreed to proceed.  Reason for visit: constipation  HPI:  Lindsey [name] is a 36 yr old woman who is intellectually disabled since birth due to cerebral palsy and has a seizure disorder.  She struggles with chronic constipation and recurrent  nosebleeds.  Her mother states that the constipation has currently resolved after 4 days of using Dulcolax,  but she does not give Lindsey [name] any daily laxatives.    She has been having occasional dry heaves,  which have been random.  Appetite is good and she is drinking fluids to an adequate amount .  Also having increased nosebleeds, but Dr Tami Ribas has recommended use of Neosporin which has helped /   ROS: See pertinent positives and negatives per HPI  Past Medical History:  Diagnosis Date   COVID-19 12/2020   Epilepsy [facility]) age 50   negative metabolic workup has brain abnormalities by MRI (Dr. 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  SOCIAL HX:    lives with mother and grandparents due to intellectual disability    Current Outpatient Medications:    ALPRAZolam (XANAX) 0.25 MG tablet, TAKE 1 TABLET BY MOUTH 2 TIMES DAILY AS NEEDED FOR ANXIETY., Disp: 45 tablet, Rfl: 5   chlorhexidine (PERIDEX) 0.12 % solution, 15 mLs 2 (two) times daily., Disp: , Rfl:    diazepam (DIASTAT ACUDIAL) 10 MG GEL, Place 20 mg rectally once., Disp: , Rfl:    diazepam (VALIUM) 5 MG tablet, 1/2 tablet in the morning,   1 tablet in the afternoon, Disp: 45 tablet, Rfl: 5   EPINEPHrine 0.3 mg/0.3 mL IJ SOAJ injection, Inject 0.3 mg into the muscle as needed for anaphylaxis., Disp: 1 each, Rfl: 1   LAMICTAL 100 MG tablet, Take 100 mg by mouth 2 (two) times daily. , Disp: , Rfl:    levETIRAcetam (KEPPRA) 750 MG tablet, Take 3 tablets (2,250 mg total) by mouth daily. TAKE 1 and a half pills twice a day, Disp: 90 tablet, Rfl: 0   melatonin 5 MG TABS, Take 5 mg by mouth., Disp: , Rfl:    PARoxetine (PAXIL) 20 MG tablet,  Take 1 tablet (20 mg total) by mouth 2 (two) times daily., Disp: 180 tablet, Rfl: 3   QUEtiapine (SEROQUEL) 25 MG tablet, Take 1 tablet (25 mg total) by mouth 2 (two) times daily., Disp: 180 tablet, Rfl: 1   traZODone (DESYREL) 50 MG tablet, Take 0.5 tablets (25 mg total) by mouth at bedtime., Disp: 90 tablet, Rfl: 1   VALTOCO 20 MG DOSE 10 MG/0.1ML LQPK, Place into both nostrils. (Patient not taking: Reported on 02/09/2022), Disp: , Rfl:   EXAM:  VITALS per patient if applicable:  GENERAL: alert, oriented, appears well and in no acute distress. Non communicative chronically except simple commands   HEENT: atraumatic, conjunttiva clear, no obvious  abnormalities on inspection of external nose and ears  NECK: normal movements of the head and neck  LUNGS: on inspection no signs of respiratory distress, breathing rate appears normal, no obvious gross SOB, gasping or wheezing  CV: no obvious cyanosis  MS: moves all visible extremities without noticeable abnormality  PSYCH/NEURO: pleasant and cooperative, no obvious depression or anxiety, speech and thought processing grossly intact  ASSESSMENT AND PLAN:  Discussed the following assessment and plan:  Long-term use of high-risk medication  Constipation, unspecified constipation type  Hyperlipidemia, unspecified hyperlipidemia type  Recurrent epistaxis  Constipation Recommend daily use of stool softener and metamucil to avoid laxative dependence.  Advised mother to increase water intake and intake of salads, vegetables.   Recurrent epistaxis improved with daily use of  neosporin     I discussed the assessment and treatment plan with the patient. The patient was provided an opportunity to ask questions and all were answered. The patient agreed with the plan and demonstrated an understanding of the instructions.   The patient was advised to call back or seek an in-person evaluation if the symptoms worsen or if the condition fails to improve as anticipated.   I spent 20 minutes dedicated to the care of this patient on the date of this encounter to include pre-visit review of Lindsey [name]'s medical history,  Face-to-face time with the patient , and counselling mother on use of therapeutics.    Crecencio Mc, MD

## 2022-02-24 ENCOUNTER — Ambulatory Visit
Admission: RE | Admit: 2022-02-24 | Discharge: 2022-02-24 | Disposition: A | Discharge status: Home / Self Care | Payer: 59 | Source: Ambulatory Visit | Attending: Emergency Medicine | Admitting: Emergency Medicine

## 2022-02-24 VITALS — BP 104/72 | HR 83 | Temp 97.7°F | Resp 18 | Ht 66.0 in | Wt 199.0 lb

## 2022-02-24 DIAGNOSIS — J029 Acute pharyngitis, unspecified: Secondary | ICD-10-CM | POA: Diagnosis not present

## 2022-02-24 LAB — POCT RAPID STREP A (OFFICE): Rapid Strep A Screen: NEGATIVE

## 2022-02-24 NOTE — ED Triage Notes (Signed)
Patient to Urgent Care with complaints of sore throat that started this morning. Mom reports that patient was struggling to swallow her pills this morning d/t to the discomfort. Denies any other symptoms.

## 2022-02-24 NOTE — Discharge Instructions (Addendum)
The rapid strep test is negative.    Follow up with your daughter's primary care provider if her symptoms are not improving.

## 2022-02-24 NOTE — ED Provider Notes (Signed)
Lindsey [name]    CSN: [id number] Arrival date & time: 02/24/22  1227      History   Chief Complaint Chief Complaint  Patient presents with   Sore Throat    Entered by patient    HPI Lindsey [name] is a 36 y.o. female.  Accompanied by her mother, patient presents with sore throat since this morning.  Mother also notes swollen lymph nodes in her neck.  She had difficulty swallowing her pills this morning.  No fever, rash, difficulty breathing, or other symptoms.  No OTC medications today.  Her medical history includes cerebral palsy.  The history is provided by a parent and medical records.    Past Medical History:  Diagnosis Date   COVID-19 12/2020   Epilepsy [facility]) age 54   negative metabolic workup has brain abnormalities by MRI (Dr. Quintin Alto, [facility])   Insomnia    Seizures (HCC)    None since approx 2018   Status post VNS (vagus nerve stimulator) placement 05/07/1998   Temporal lobectomy behavior syndrome 05/07/1992   Right    Patient Active Problem List   Diagnosis Date Noted   Recurrent epistaxis 02/09/2022   Hyponatremia 07/10/2021   Hallucinations, visual 07/10/2021   Allergic rhinitis due to grass pollen 07/10/2021   Obesity (BMI 35.0-39.9 without comorbidity) 03/16/2021   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   Drug-induced leukopenia (HCC) 06/29/2020   Change in behavior 05/04/2020   Moderate intellectual disability 05/04/2020   Poor dentition 09/02/2017   Constipation 11/10/2015   Long-term use of high-risk medication 10/11/2015   Xerosis of skin 12/12/2014   Restless legs syndrome 07/12/2014   Restlessness and agitation 09/10/2012   Routine general medical examination at a health care facility 06/08/2012   Cerebral palsy (HCC) 05/08/2012   Acneiform dermatitis 06/30/2011   Partial epilepsy with intractable epilepsy (HCC) 03/23/2011    Past Surgical History:  Procedure Laterality Date   ABDOMINAL HYSTERECTOMY  2005    BRAIN SURGERY     Temporal Lobe resection   CRANIOTOMY FOR TEMPORAL LOBECTOMY     TONSILLECTOMY AND ADENOIDECTOMY  age 60    OB History   No obstetric history on file.      Home Medications    Prior to Admission medications   Medication Sig Start Date End Date Taking? Authorizing Provider  ALPRAZolam (XANAX) 0.25 MG tablet TAKE 1 TABLET BY MOUTH 2 TIMES DAILY AS NEEDED FOR ANXIETY. 12/07/21   Sherlene Shams, MD  chlorhexidine (PERIDEX) 0.12 % solution 15 mLs 2 (two) times daily. 02/10/20   [provider]  diazepam (DIASTAT ACUDIAL) 10 MG GEL Place 20 mg rectally once.    [provider]  diazepam (VALIUM) 5 MG tablet 1/2 tablet in the morning,   1 tablet in the afternoon 10/20/21   Sherlene Shams, MD  EPINEPHrine 0.3 mg/0.3 mL IJ SOAJ injection Inject 0.3 mg into the muscle as needed for anaphylaxis. 08/14/21   Sherlene Shams, MD  LAMICTAL 100 MG tablet Take 100 mg by mouth 2 (two) times daily.  08/03/16   [provider]  levETIRAcetam (KEPPRA) 750 MG tablet Take 3 tablets (2,250 mg total) by mouth daily. TAKE 1 and a half pills twice a day 04/12/20   Clapacs, John T, MD  melatonin 5 MG TABS Take 5 mg by mouth.    [provider]  PARoxetine (PAXIL) 20 MG tablet Take 1 tablet (20 mg total) by mouth 2 (two) times daily. 10/20/21  Crecencio Mc, MD  QUEtiapine (SEROQUEL) 25 MG tablet Take 1 tablet (25 mg total) by mouth 2 (two) times daily. 10/20/21   Crecencio Mc, MD  traZODone (DESYREL) 50 MG tablet Take 0.5 tablets (25 mg total) by mouth at bedtime. 10/20/21   Crecencio Mc, MD  VALTOCO 20 MG DOSE 10 MG/0.1ML LQPK Place into both nostrils. Patient not taking: Reported on 02/09/2022 12/12/20   [provider]    Family History History reviewed. No pertinent family history.  Social History Social History   Tobacco Use   Smoking status: Never   Smokeless tobacco: Never  Vaping Use   Vaping Use: Never used  Substance Use Topics    Alcohol use: No    Alcohol/week: 0.0 standard drinks of alcohol   Drug use: No     Allergies   Other, Influenza vac split quad, Benadryl [diphenhydramine hcl], Montelukast, Ropinirole, Zyrtec [cetirizine hcl], and Peanut-containing drug products   Review of Systems Review of Systems  Constitutional:  Negative for chills and fever.  HENT:  Positive for sore throat and trouble swallowing. Negative for ear pain.   Respiratory:  Negative for cough and shortness of breath.   Skin:  Negative for rash.  All other systems reviewed and are negative.    Physical Exam Triage Vital Signs ED Triage Vitals  Enc Vitals Group     BP      Pulse      Resp      Temp      Temp src      SpO2      Weight      Height      Head Circumference      Peak Flow      Pain Score      Pain Loc      Pain Edu?      Excl. in [place]?    No data found.  Updated Vital Signs BP 104/72   Pulse 83   Temp 97.7 F (36.5 C)   Resp 18   Ht 5\' 6"  (1.676 m)   Wt 199 lb (90.3 kg)   LMP 01/09/2011   SpO2 96%   BMI 32.12 kg/m   Visual Acuity Right Eye Distance:   Left Eye Distance:   Bilateral Distance:    Right Eye Near:   Left Eye Near:    Bilateral Near:     Physical Exam Vitals and nursing note reviewed.  Constitutional:      General: She is not in acute distress.    Appearance: She is well-developed. She is not ill-appearing.  HENT:     Right Ear: Tympanic membrane normal.     Left Ear: Tympanic membrane normal.     Nose: Nose normal.     Mouth/Throat:     Mouth: Mucous membranes are moist.     Pharynx: Posterior oropharyngeal erythema present.     Tonsils: 0 on the right. 0 on the left.     Comments: Clear PND.  Neck:     Comments: [place] cervical lymphadenopathy.  Cardiovascular:     Rate and Rhythm: Normal rate and regular rhythm.     Heart sounds: Normal heart sounds.  Pulmonary:     Effort: Pulmonary effort is normal. No respiratory distress.     Breath sounds: Normal breath  sounds.  Musculoskeletal:     Cervical back: Neck supple.  Lymphadenopathy:     Cervical: Cervical adenopathy present.  Skin:    General:  Skin is warm and dry.  Neurological:     Mental Status: She is alert.      UC Treatments / Results  Labs (all labs ordered are listed, but only abnormal results are displayed) Labs Reviewed  POCT RAPID STREP A (OFFICE)    EKG   Radiology No results found.  Procedures Procedures (including critical care time)  Medications Ordered in UC Medications - No data to display  Initial Impression / Assessment and Plan / UC Course  I have reviewed the triage vital signs and the nursing notes.  Pertinent labs & imaging results that were available during my care of the patient were reviewed by me and considered in my medical decision making (see chart for details).    Acute pharyngitis.  Rapid strep negative.  Mother declines COVID test today.  Discussed symptomatic treatment including Tylenol or ibuprofen.  Instructed mother to follow-up with patient's PCP if her symptoms are not improving.  She agrees with plan of care.     Final Clinical Impressions(s) / UC Diagnoses   Final diagnoses:  Acute pharyngitis, unspecified etiology     Discharge Instructions      The rapid strep test is negative.    Follow up with your daughter's primary care provider if her symptoms are not improving.           ED Prescriptions   None    PDMP not reviewed this encounter.   Sharion Balloon, NP 02/24/22 1256

## 2022-03-19 ENCOUNTER — Encounter: Payer: Self-pay | Admitting: Internal Medicine

## 2022-03-19 ENCOUNTER — Ambulatory Visit (INDEPENDENT_AMBULATORY_CARE_PROVIDER_SITE_OTHER): Payer: 59 | Admitting: Internal Medicine

## 2022-03-19 VITALS — BP 98/72 | HR 85 | Temp 98.6°F | Ht 66.0 in | Wt 133.2 lb

## 2022-03-19 DIAGNOSIS — K5900 Constipation, unspecified: Secondary | ICD-10-CM

## 2022-03-19 DIAGNOSIS — R7301 Impaired fasting glucose: Secondary | ICD-10-CM | POA: Diagnosis not present

## 2022-03-19 DIAGNOSIS — Z79899 Other long term (current) drug therapy: Secondary | ICD-10-CM | POA: Diagnosis not present

## 2022-03-19 DIAGNOSIS — D72819 Decreased white blood cell count, unspecified: Secondary | ICD-10-CM

## 2022-03-19 DIAGNOSIS — Z0000 Encounter for general adult medical examination without abnormal findings: Secondary | ICD-10-CM | POA: Diagnosis not present

## 2022-03-19 DIAGNOSIS — G809 Cerebral palsy, unspecified: Secondary | ICD-10-CM | POA: Diagnosis not present

## 2022-03-19 DIAGNOSIS — D702 Other drug-induced agranulocytosis: Secondary | ICD-10-CM | POA: Diagnosis not present

## 2022-03-19 DIAGNOSIS — R451 Restlessness and agitation: Secondary | ICD-10-CM

## 2022-03-19 DIAGNOSIS — E785 Hyperlipidemia, unspecified: Secondary | ICD-10-CM

## 2022-03-19 DIAGNOSIS — G40119 Localization-related (focal) (partial) symptomatic epilepsy and epileptic syndromes with simple partial seizures, intractable, without status epilepticus: Secondary | ICD-10-CM

## 2022-03-19 LAB — CBC WITH DIFFERENTIAL/PLATELET
Basophils Absolute: 0 10*3/uL (ref 0.0–0.1)
Basophils Relative: 0.4 % (ref 0.0–3.0)
Eosinophils Absolute: 0 10*3/uL (ref 0.0–0.7)
Eosinophils Relative: 0.1 % (ref 0.0–5.0)
HCT: 37.9 % (ref 36.0–46.0)
Hemoglobin: 12.8 g/dL (ref 12.0–15.0)
Lymphocytes Relative: 39.3 % (ref 12.0–46.0)
Lymphs Abs: 0.8 10*3/uL (ref 0.7–4.0)
MCHC: 33.9 g/dL (ref 30.0–36.0)
MCV: 89.6 fl (ref 78.0–100.0)
Monocytes Absolute: 0.2 10*3/uL (ref 0.1–1.0)
Monocytes Relative: 7.4 % (ref 3.0–12.0)
Neutro Abs: 1.1 10*3/uL — ABNORMAL LOW (ref 1.4–7.7)
Neutrophils Relative %: 52.8 % (ref 43.0–77.0)
Platelets: 131 10*3/uL — ABNORMAL LOW (ref 150.0–400.0)
RBC: 4.23 Mil/uL (ref 3.87–5.11)
RDW: 15.2 % (ref 11.5–15.5)
WBC: 2.1 10*3/uL — ABNORMAL LOW (ref 4.0–10.5)

## 2022-03-19 LAB — LIPID PANEL
Cholesterol: 129 mg/dL (ref 0–200)
HDL: 32.3 mg/dL — ABNORMAL LOW (ref >39.00)
LDL Cholesterol: 81 mg/dL (ref 0–99)
NonHDL: 96.79
Total CHOL/HDL Ratio: 4
Triglycerides: 78 mg/dL (ref 0.0–149.0)
VLDL: 15.6 mg/dL (ref 0.0–40.0)

## 2022-03-19 LAB — COMPREHENSIVE METABOLIC PANEL
ALT: 7 U/L (ref 0–35)
AST: 13 U/L (ref 0–37)
Albumin: 4 g/dL (ref 3.5–5.2)
Alkaline Phosphatase: 92 U/L (ref 39–117)
BUN: 7 mg/dL (ref 6–23)
CO2: 27 mEq/L (ref 19–32)
Calcium: 9.1 mg/dL (ref 8.4–10.5)
Chloride: 99 mEq/L (ref 96–112)
Creatinine, Ser: 0.68 mg/dL (ref 0.40–1.20)
GFR: 113.45 mL/min (ref >60.00)
Glucose, Bld: 92 mg/dL (ref 70–99)
Potassium: 3.8 mEq/L (ref 3.5–5.1)
Sodium: 133 mEq/L — ABNORMAL LOW (ref 135–145)
Total Bilirubin: 0.4 mg/dL (ref 0.2–1.2)
Total Protein: 6.7 g/dL (ref 6.0–8.3)

## 2022-03-19 LAB — TSH: TSH: 1.51 u[IU]/mL (ref 0.35–5.50)

## 2022-03-19 LAB — HEMOGLOBIN A1C: Hgb A1c MFr Bld: 4.7 % (ref 4.6–6.5)

## 2022-03-19 MED ORDER — DIAZEPAM 2 MG PO TABS: ORAL_TABLET | ORAL | 5 refills | Status: DC

## 2022-03-19 NOTE — Progress Notes (Signed)
Patient ID: Lindsey [name], female    DOB: 03-13-1986  Age: 36 y.o. MRN: [id number]  The patient is here for annual preventive  examination and management of other chronic and acute problems. She has Cerebral palsy,  is mostly nonverbal  and is accompanied  by her mother who is her guardian.    The risk factors are reflected in the social history.  The roster of all physicians providing medical care to patient - is listed in the Snapshot section of the chart.  Activities of daily living:  The patient is NOT 100% dependent in all ADLs  and liver with her mother and grandparents.  She requires assistance wit preparing meals , medications, and toileting, but has independent mobility  Home safety : The patient has smoke detectors in the home. They wear seatbelts.  There are no firearms at home. There is no violence in the home.   There is no risks for hepatitis, STDs or HIV. There is no   history of blood transfusion. They have no travel history to infectious disease endemic areas of the world.  The patient has seen their dentist in the last six month. They have seen their eye doctor in the last year. She is unable to provide details about her visio or her hearing due to limited abillity to converse.  do not  have excessive sun exposure. Discussed the need for sun protection: hats, long sleeves and use of sunscreen if there is significant sun exposure.   Diet: the importance of a healthy diet is discussed. She has a generally healthy diet, but eats fast food 3 or 4 times per week.   The benefits of regular aerobic exercise were discussed. She does not exercise regularly    Depression screen: there are no signs or vegative symptoms of depression, but multiple symptoms of anxiety managed with medications sadness/tearfullness.  Cognitive assessment: the patient cannot manage any of her financial and personal affairs and is not actively engaged. She cannot  relate day,date,year and events; recalled  2/3 objects at 3 minutes; performed clock-face test normally.  The following portions of the patient's history were reviewed and updated as appropriate: allergies, current medications, past family history, past medical history,  past surgical history, past social history  and problem list.  Visual acuity was not assessed due to patient's inability to follow commands.  During the course of the visit the patient's mother  was involved in a discussion reviewing  appropriate screening and preventive services including : fall prevention , diabetes screening, nutrition counseling, colorectal cancer screening, and recommended immunizations.    CC: The primary encounter diagnosis was Hyperlipidemia, unspecified hyperlipidemia type. Diagnoses of Impaired fasting glucose, Leukopenia, unspecified type, Long-term use of high-risk medication, Cerebral palsy, unspecified type ([place]), Constipation, unspecified constipation type, Drug-induced leukopenia ([place]), Partial epilepsy with intractable epilepsy ([place]), Restlessness and agitation, and Routine general medical examination at a health care facility were also pertinent to this visit.  Evaluated by ED/Urgent  Care for acute pharyngitis on oct 21 .  Strep negative. Supportive care outlined.  COVID testing declined by mother   Constipation still an issue,  currently managed with the following regimen : prebiotics,  fiber supplement  daily .  Dulcolax every other day,  moving bowels 2-3 times weekly.  Had a regurgitation episode on Satruday after eating a cheeseburger too quickly.    Frequent nosebleeds:  2 weeks after Urgent Care saw ENT evaluation by [facility] cerumen impaction,  sinus/throat evaluation.  Advised to use  neosporing tid.    History Lindsey [name] has a past medical history of COVID-19 (12/2020), Epilepsy [facility]) (age 36), Insomnia, Seizures ([place]), Status post VNS (vagus nerve stimulator) placement (05/07/1998), and Temporal lobectomy behavior syndrome  (05/07/1992).   She has a past surgical history that includes Tonsillectomy and adenoidectomy (age 61); Abdominal hysterectomy (2005); Brain surgery; and Craniotomy for temporal lobectomy.   Her family history is not on file.She reports that she has never smoked. She has never used smokeless tobacco. She reports that she does not drink alcohol and does not use drugs.  Outpatient Medications Prior to Visit  Medication Sig Dispense Refill   ALPRAZolam (XANAX) 0.25 MG tablet TAKE 1 TABLET BY MOUTH 2 TIMES DAILY AS NEEDED FOR ANXIETY. 45 tablet 5   chlorhexidine (PERIDEX) 0.12 % solution 15 mLs 2 (two) times daily.     diazepam (DIASTAT ACUDIAL) 10 MG GEL Place 20 mg rectally once.     EPINEPHrine 0.3 mg/0.3 mL IJ SOAJ injection Inject 0.3 mg into the muscle as needed for anaphylaxis. 1 each 1   LAMICTAL 100 MG tablet Take 100 mg by mouth 2 (two) times daily.      levETIRAcetam (KEPPRA) 750 MG tablet Take 3 tablets (2,250 mg total) by mouth daily. TAKE 1 and a half pills twice a day 90 tablet 0   melatonin 5 MG TABS Take 5 mg by mouth.     PARoxetine (PAXIL) 20 MG tablet Take 1 tablet (20 mg total) by mouth 2 (two) times daily. 180 tablet 3   QUEtiapine (SEROQUEL) 25 MG tablet Take 1 tablet (25 mg total) by mouth 2 (two) times daily. 180 tablet 1   traZODone (DESYREL) 50 MG tablet Take 0.5 tablets (25 mg total) by mouth at bedtime. 90 tablet 1   VALTOCO 20 MG DOSE 10 MG/0.1ML LQPK Place into both nostrils.     diazepam (VALIUM) 5 MG tablet 1/2 tablet in the morning,   1 tablet in the afternoon 45 tablet 5   No facility-administered medications prior to visit.    Review of Systems  Patient's mother  denies headache, fevers, malaise, unintentional weight loss, skin rash, eye pain, sinus congestion and sinus pain, sore throat, dysphagia,  hemoptysis , cough, dyspnea, wheezing, chest pain, palpitations, orthopnea, edema, abdominal pain, nausea, melena, diarrhea, constipation, flank pain, dysuria,  hematuria, urinary  Frequency, nocturia, numbness, tingling, seizures,  Focal weakness, Loss of consciousness,  Tremor, insomnia, depression, anxiety, and suicidal ideation.     Objective:  BP 98/72   Pulse 85   Temp 98.6 F (37 C) (Oral)   Ht _0  (1.676 m)   Wt 133 lb 3.2 oz (60.4 kg)   LMP 01/09/2011   SpO2 98%   BMI 21.50 kg/m   Physical Exam  General appearance: sedated/sleepy,  responsive to verbal commands and appears younger than stated age Ears: right external canal with dried blood,  intact  TM's bilaterally  Throat: lips, mucosa, and tongue normal; teeth  in poor shape,  gums normal Neck: no adenopathy, no carotid bruit, supple, symmetrical, trachea midline and thyroid not enlarged, symmetric, no tenderness/mass/nodules Back: symmetric, no curvature. ROM normal. No CVA tenderness. Lungs: clear to auscultation bilaterally Heart: regular rate and rhythm, S1, S2 normal, no murmur, click, rub or gallop Abdomen: soft, non-tender; bowel sounds normal; no masses,  no organomegaly Pulses: 2+ and symmetric Skin: Skin color, texture, turgor normal. No rashes or lesions Lymph nodes: Cervical, supraclavicular, and axillary nodes normal.   Assessment & Plan:  Problem List Items Addressed This Visit     Cerebral palsy ([place])    With intellectual disability and seizure disorder.  History of temporal lobe resection .  She remains dependent on mother for ADLs. Continue keppra and lamictal for seizure prevention,  diazepam and quetiapine doses reviewed and adjusted as she is oversedated today       Constipation    Managed with daily use of fiber supplementation and prn use of dulcolax.  Averaging 3 BMs per week       Drug-induced leukopenia (HCC)    Intermittent, Presumed due to use of lamictal for prevention of seizures  Lab Results  Component Value Date   WBC 6.4 06/10/2021   HGB 14.1 06/10/2021   HCT 42.2 06/10/2021   MCV 83.7 06/10/2021   PLT 252 06/10/2021         Long-term use of high-risk medication   Relevant Orders   Lamotrigine level   Levetiracetam level   Partial epilepsy with intractable epilepsy ([place])    She has been seizure free for over 24 months .  Continue lamictal and Keppra.  lamictal dose reduce gradually by Dr Lisabeth Devoid [facility]) due to tremor reported by mother which is  not present on today's visit      Restlessness and agitation    Managed with  seroquel at 25 mg bid instead of using 50 g qhs .  Valium dose has been reduced from 5.25 /5 mg to 2 mg/4 mg .  Prn  alprazolam      Routine general medical examination at a health care facility    age appropriate education and counseling updated, referrals for preventative services and immunizations addressed, dietary and smoking counseling addressed, most recent labs reviewed.  I have personally reviewed and have noted:   1) the patient's medical and social history 2) The pt's use of alcohol, tobacco, and illicit drugs 3) The patient's current medications and supplements 4) Functional ability including ADL's, fall risk, home safety risk, hearing and visual impairment 5) Diet and physical activities 6) Evidence for depression or mood disorder 7) The patient's height, weight, and BMI have been recorded in the chart   I have made referrals, and provided counseling and education based on review of the above       Other Visit Diagnoses     Hyperlipidemia, unspecified hyperlipidemia type    -  Primary   Relevant Orders   Lipid Profile   Comp Met (CMET)   TSH   Impaired fasting glucose       Relevant Orders   HgB A1c   Leukopenia, unspecified type       Relevant Orders   CBC w/Diff       I am having Lindsey [name] start on diazepam. I am also having her maintain her diazepam, LaMICtal, melatonin, chlorhexidine, levETIRAcetam, Valtoco 20 MG Dose, EPINEPHrine, PARoxetine, traZODone, QUEtiapine, and ALPRAZolam.    I provided 40 minutes of  face-to-face time during  this encounter reviewing patient's current problems and past surgeries,  recent labs and imaging studies, providing counseling on the above mentioned problems , and coordination  of care .   Follow-up: No follow-ups on file.   Crecencio Mc, MD

## 2022-03-19 NOTE — Assessment & Plan Note (Signed)
Managed with daily use of fiber supplementation and prn use of dulcolax.  Averaging 3 BMs per week

## 2022-03-19 NOTE — Assessment & Plan Note (Signed)
She has been seizure free for over 24 months .  Continue lamictal and Keppra.  lamictal dose reduce gradually by Dr Quintin Alto [facility]) due to tremor reported by mother which is  not present on today's visit

## 2022-03-19 NOTE — Assessment & Plan Note (Signed)

## 2022-03-19 NOTE — Assessment & Plan Note (Addendum)
Managed with  seroquel at 25 mg bid instead of using 50 g qhs .  Valium dose has been reduced from 5.25 /5 mg to 2 mg/4 mg .  Prn  alprazolam

## 2022-03-19 NOTE — Assessment & Plan Note (Signed)
Intermittent, Presumed due to use of lamictal for prevention of seizures  Lab Results  Component Value Date   WBC 6.4 06/10/2021   HGB 14.1 06/10/2021   HCT 42.2 06/10/2021   MCV 83.7 06/10/2021   PLT 252 06/10/2021

## 2022-03-19 NOTE — Patient Instructions (Addendum)
[name] seems oversedating  I am reducing  Lindsey [name]'s morning dose of diazepam to  2 mg and her evening dose  to 4 mg

## 2022-03-19 NOTE — Assessment & Plan Note (Signed)
With intellectual disability and seizure disorder.  History of temporal lobe resection .  She remains dependent on mother for ADLs. Continue keppra and lamictal for seizure prevention,  diazepam and quetiapine doses reviewed and adjusted as she is oversedated today

## 2022-03-20 ENCOUNTER — Encounter: Payer: Self-pay | Admitting: Internal Medicine

## 2022-03-20 DIAGNOSIS — D702 Other drug-induced agranulocytosis: Secondary | ICD-10-CM

## 2022-03-20 DIAGNOSIS — D696 Thrombocytopenia, unspecified: Secondary | ICD-10-CM

## 2022-03-23 ENCOUNTER — Encounter: Payer: Self-pay | Admitting: Internal Medicine

## 2022-03-23 ENCOUNTER — Telehealth: Payer: Self-pay | Admitting: Internal Medicine

## 2022-03-23 LAB — LEVETIRACETAM LEVEL: Keppra (Levetiracetam): 75.5 ug/mL — ABNORMAL HIGH

## 2022-03-23 LAB — LAMOTRIGINE LEVEL: Lamotrigine Lvl: 16.2 ug/mL — ABNORMAL HIGH (ref 2.5–15.0)

## 2022-03-23 NOTE — Telephone Encounter (Signed)
Placed in quick sign folder.  

## 2022-03-23 NOTE — Telephone Encounter (Signed)
Patient's  mother dropped off a over the counter medication form for Dr Darrick Huntsman to sign. Form is up front in Dr Melina Schools color folder.

## 2022-03-24 NOTE — Assessment & Plan Note (Signed)
Lamictal level was high on 100 mg twice daily.  I advised [name] to reduce Braelee's dose  to 59 mg twice daily until she could contact her neurologist Keppra level was very high on 3 tablets daily.  I advised julie to reduce dose to 2 tablets daily until she could contact her neurologist

## 2022-03-26 NOTE — Telephone Encounter (Signed)
Pt's mother is aware form has been completed and placed in the mail today.

## 2022-03-27 ENCOUNTER — Inpatient Hospital Stay: Payer: 59

## 2022-03-27 ENCOUNTER — Encounter: Payer: Self-pay | Admitting: Internal Medicine

## 2022-03-27 ENCOUNTER — Inpatient Hospital Stay: Payer: 59 | Attending: Internal Medicine | Admitting: Internal Medicine

## 2022-03-27 VITALS — BP 116/86 | HR 80 | Temp 96.8°F | Resp 19 | Wt 133.0 lb

## 2022-03-27 DIAGNOSIS — Z79899 Other long term (current) drug therapy: Secondary | ICD-10-CM | POA: Insufficient documentation

## 2022-03-27 DIAGNOSIS — D709 Neutropenia, unspecified: Secondary | ICD-10-CM

## 2022-03-27 DIAGNOSIS — D696 Thrombocytopenia, unspecified: Secondary | ICD-10-CM | POA: Insufficient documentation

## 2022-03-27 DIAGNOSIS — T4275XA Adverse effect of unspecified antiepileptic and sedative-hypnotic drugs, initial encounter: Secondary | ICD-10-CM | POA: Insufficient documentation

## 2022-03-27 DIAGNOSIS — G809 Cerebral palsy, unspecified: Secondary | ICD-10-CM | POA: Diagnosis not present

## 2022-03-27 DIAGNOSIS — D72819 Decreased white blood cell count, unspecified: Secondary | ICD-10-CM | POA: Diagnosis present

## 2022-03-27 DIAGNOSIS — G40909 Epilepsy, unspecified, not intractable, without status epilepticus: Secondary | ICD-10-CM | POA: Diagnosis not present

## 2022-03-27 LAB — CBC WITH DIFFERENTIAL/PLATELET
Abs Immature Granulocytes: 0 10*3/uL (ref 0.00–0.07)
Basophils Absolute: 0 10*3/uL (ref 0.0–0.1)
Basophils Relative: 1 %
Eosinophils Absolute: 0 10*3/uL (ref 0.0–0.5)
Eosinophils Relative: 0 %
HCT: 41.4 % (ref 36.0–46.0)
Hemoglobin: 13.9 g/dL (ref 12.0–15.0)
Immature Granulocytes: 0 %
Lymphocytes Relative: 34 %
Lymphs Abs: 1 10*3/uL (ref 0.7–4.0)
MCH: 29.7 pg (ref 26.0–34.0)
MCHC: 33.6 g/dL (ref 30.0–36.0)
MCV: 88.5 fL (ref 80.0–100.0)
Monocytes Absolute: 0.2 10*3/uL (ref 0.1–1.0)
Monocytes Relative: 5 %
Neutro Abs: 1.8 10*3/uL (ref 1.7–7.7)
Neutrophils Relative %: 60 %
Platelets: 171 10*3/uL (ref 150–400)
RBC: 4.68 MIL/uL (ref 3.87–5.11)
RDW: 13.6 % (ref 11.5–15.5)
WBC: 3 10*3/uL — ABNORMAL LOW (ref 4.0–10.5)
nRBC: 0 % (ref 0.0–0.2)

## 2022-03-27 LAB — VITAMIN B12: Vitamin B-12: 350 pg/mL (ref 180–914)

## 2022-03-27 LAB — FOLATE: Folate: 4.6 ng/mL — ABNORMAL LOW (ref >5.9)

## 2022-03-27 LAB — HEPATITIS B SURFACE ANTIGEN: Hepatitis B Surface Ag: NONREACTIVE

## 2022-03-27 NOTE — Progress Notes (Signed)
[facility]  Telephone:([phone] [phone]  ID: Lindsey [name] OB: March 25, 1986  MR#: [id number]  GDJ#:[id number]  Patient Care Team: Sherlene Shams, MD as PCP - General (Internal Medicine)  REFERRING PROVIDER: Dr. Darrick Huntsman  REASON FOR REFERRAL: Leukopenia and thrombocytopenia  HPI: Lindsey [name] is a 36 y.o. female with past medical history of cerebral palsy, nonverbal, epilepsy who was referred to hematology for leukopenia and thrombocytopenia.  Information was obtained from the mother who is the caregiver.  Patient is on Lamictal and Keppra for seizure.  She is also on Seroquel and paroxetine.  Denied any recent illnesses, fever, chills, nausea, vomiting, bowel or bladder issue.  Denies any changes to the medication recently.  Labs reviewed. CBC from 03/19/2022 showed WBC 12.1, ANC 1.1, hemoglobin 12.8 and platelets 131.  She had intermittent mild leukopenia in 2022 which had resolved.    REVIEW OF SYSTEMS:   ROS  As per HPI. Otherwise, a complete review of systems is negative.  PAST MEDICAL HISTORY: Past Medical History:  Diagnosis Date   COVID-19 12/2020   Epilepsy [facility]) age 12   negative metabolic workup has brain abnormalities by MRI (Dr. 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 No pertinent family history.  HEALTH MAINTENANCE: Social History   Tobacco Use   Smoking status: Never   Smokeless tobacco: Never  Vaping Use   Vaping Use: Never used  Substance Use Topics   Alcohol use: No    Alcohol/week: 0.0  standard drinks of alcohol   Drug use: No     Allergies  Allergen Reactions   Other Swelling     Allergic Reaction to all nuts/ingredients   Influenza Vac Split Quad Other (See Comments)    48 hours of fever, myalgias    Benadryl [Diphenhydramine Hcl]     Seizure   Montelukast Swelling   Ropinirole Swelling    Itching,    Zyrtec [Cetirizine Hcl]     Seizure    Peanut-Containing Drug Products Rash    Current Outpatient Medications  Medication Sig Dispense Refill   ALPRAZolam (XANAX) 0.25 MG tablet TAKE 1 TABLET BY MOUTH 2 TIMES DAILY AS NEEDED FOR ANXIETY. 45 tablet 5   chlorhexidine (PERIDEX) 0.12 % solution 15 mLs 2 (two) times daily.     diazepam (DIASTAT ACUDIAL) 10 MG GEL Place 20 mg rectally once.     diazepam (VALIUM) 2 MG tablet 1 tablet in the am,  2 tablets in the PM 90 tablet 5   EPINEPHrine 0.3 mg/0.3 mL IJ SOAJ injection Inject 0.3 mg into the muscle as needed for anaphylaxis. 1 each 1   LAMICTAL 100 MG tablet Take 100 mg by mouth 2 (two) times daily.      levETIRAcetam (KEPPRA) 750 MG tablet Take 3 tablets (2,250 mg total) by mouth daily. TAKE 1 and a half pills twice a day 90 tablet 0   melatonin 5 MG TABS Take 5 mg by mouth.     PARoxetine (PAXIL) 20 MG tablet  Take 1 tablet (20 mg total) by mouth 2 (two) times daily. 180 tablet 3   QUEtiapine (SEROQUEL) 25 MG tablet Take 1 tablet (25 mg total) by mouth 2 (two) times daily. 180 tablet 1   traZODone (DESYREL) 50 MG tablet Take 0.5 tablets (25 mg total) by mouth at bedtime. 90 tablet 1   VALTOCO 20 MG DOSE 10 MG/0.1ML LQPK Place into both nostrils.     No current facility-administered medications for this visit.    OBJECTIVE: Vitals:   03/27/22 1141  BP: 116/86  Pulse: 80  Resp: 19  Temp: (!) 96.8 F (36 C)  SpO2: 98%     Body mass index is 21.47 kg/m.      General: Well-developed, well-nourished, no acute distress. Eyes: Pink conjunctiva, anicteric sclera. HEENT: Normocephalic, moist mucous membranes,  clear oropharnyx. Lungs: Clear to auscultation bilaterally. Heart: Regular rate and rhythm. No rubs, murmurs, or gallops. Abdomen: Soft, nontender, nondistended. No organomegaly noted, normoactive bowel sounds. Musculoskeletal: No edema, cyanosis, or clubbing. Neuro: Alert, answering all questions appropriately. Cranial nerves grossly intact. Skin: No rashes or petechiae noted. Psych: Normal affect. Lymphatics: No cervical, calvicular, axillary or inguinal LAD.   LAB RESULTS:  Lab Results  Component Value Date   NA 133 (L) 03/19/2022   K 3.8 03/19/2022   CL 99 03/19/2022   CO2 27 03/19/2022   GLUCOSE 92 03/19/2022   BUN 7 03/19/2022   CREATININE 0.68 03/19/2022   CALCIUM 9.1 03/19/2022   PROT 6.7 03/19/2022   ALBUMIN 4.0 03/19/2022   AST 13 03/19/2022   ALT 7 03/19/2022   ALKPHOS 92 03/19/2022   BILITOT 0.4 03/19/2022   GFRNONAA >60 06/10/2021   GFRAA >60 03/10/2018    Lab Results  Component Value Date   WBC 3.0 (L) 03/27/2022   NEUTROABS 1.8 03/27/2022   HGB 13.9 03/27/2022   HCT 41.4 03/27/2022   MCV 88.5 03/27/2022   PLT 171 03/27/2022    Lab Results  Component Value Date   FERRITIN 14.7 09/10/2012   IRONPCTSAT 31.4 10/05/2014     STUDIES: No results found.  ASSESSMENT AND PLAN:   Lindsey [name] is a 36 y.o. female with pmh of cerebral palsy, nonverbal, epilepsy who was referred to hematology for leukopenia and thrombocytopenia.  # Leukopenia # Thrombocytopenia -New onset of unclear etiology - CBC from 03/19/2022 showed WBC 12.1, ANC 1.1, hemoglobin 12.8 and platelets 131.  She had intermittent mild leukopenia in 2022 which had resolved.  -Discussed with the mother about reasons for low counts such as medications, nutritional, recent infections.  Labs as below.  Orders Placed This Encounter  Procedures   CBC with Differential   Vitamin B12   Folate   Flow cytometry panel-leukemia/lymphoma work-up   Hepatitis C antibody   Hepatitis B surface  antigen   Hepatitis B core antibody, IgM   RTC in 1 week via MyChart video.  Patient expressed understanding and was in agreement with this plan. She also understands that She can call clinic at any time with any questions, concerns, or complaints.   I spent a total of 45 minutes reviewing chart data, face-to-face evaluation with the patient, counseling and coordination of care as detailed above.  Michaelyn Barter, MD   03/27/2022 3:51 PM

## 2022-03-28 LAB — HEPATITIS B CORE ANTIBODY, IGM: Hep B C IgM: NONREACTIVE

## 2022-03-28 LAB — HEPATITIS C ANTIBODY: HCV Ab: NONREACTIVE

## 2022-04-02 LAB — COMP PANEL: LEUKEMIA/LYMPHOMA

## 2022-04-05 ENCOUNTER — Inpatient Hospital Stay (HOSPITAL_BASED_OUTPATIENT_CLINIC_OR_DEPARTMENT_OTHER): Payer: 59 | Admitting: Internal Medicine

## 2022-04-05 DIAGNOSIS — D702 Other drug-induced agranulocytosis: Secondary | ICD-10-CM

## 2022-04-05 DIAGNOSIS — E538 Deficiency of other specified B group vitamins: Secondary | ICD-10-CM | POA: Diagnosis not present

## 2022-04-05 DIAGNOSIS — D709 Neutropenia, unspecified: Secondary | ICD-10-CM | POA: Diagnosis not present

## 2022-04-05 DIAGNOSIS — D696 Thrombocytopenia, unspecified: Secondary | ICD-10-CM

## 2022-04-05 MED ORDER — FOLIC ACID 1 MG PO TABS: 1.0000 mg | ORAL_TABLET | Freq: Every day | ORAL | 2 refills | Status: AC

## 2022-04-05 NOTE — Progress Notes (Signed)
[facility]  Telephone:([phone] [phone]  I connected with  Lindsey [name] on 04/05/22 by a phone enabled telemedicine application and verified that I am speaking with the correct person using two identifiers.   I discussed the limitations of evaluation and management by telemedicine. The patient expressed understanding and agreed to proceed.  ID: Lindsey [name] OB: 01/13/1986  MR#: [id number]  CSN#:[id number]  Patient Care Team: Sherlene Shams, MD as PCP - General (Internal Medicine)  REFERRING PROVIDER: Dr. Darrick Huntsman  REASON FOR REFERRAL: Leukopenia and thrombocytopenia  HPI: Lindsey [name] is a 36 y.o. female with past medical history of cerebral palsy, nonverbal, epilepsy who was referred to hematology for leukopenia and thrombocytopenia.  Information was obtained from the mother who is the caregiver.  Patient is on Lamictal and Keppra for seizure.  She is also on Seroquel and paroxetine.  Denied any recent illnesses, fever, chills, nausea, vomiting, bowel or bladder issue.  Denies any changes to the medication recently.  Labs reviewed. CBC from 03/19/2022 showed WBC 2.1, ANC 1.1, hemoglobin 12.8 and platelets 131.  She had intermittent mild leukopenia in 2021 and 2022 which had resolved.  INTERVAL HISTORY-  Spoke with the mother who is the guardian.  She preferred telephonic visit over the video. Shatina has been doing okay.  No concerns.  We discussed the lab results.  REVIEW OF SYSTEMS:   Review of Systems  Unable to perform ROS: Patient nonverbal    As per HPI. Otherwise, a complete review of systems is negative.  PAST MEDICAL HISTORY: Past Medical History:  Diagnosis Date   COVID-19 12/2020   Epilepsy [facility]) age 21   negative metabolic workup has brain abnormalities by MRI (Dr. Quintin Alto, [facility])   Insomnia    Seizures (HCC)    None since approx 2018   Status post VNS (vagus nerve stimulator) placement 05/07/1998   Temporal  lobectomy behavior syndrome 05/07/1992   Right    PAST SURGICAL HISTORY: Past Surgical History:  Procedure Laterality Date   ABDOMINAL HYSTERECTOMY  2005   BRAIN SURGERY     Temporal Lobe resection   CRANIOTOMY FOR TEMPORAL LOBECTOMY     TONSILLECTOMY AND ADENOIDECTOMY  age 89    FAMILY HISTORY: No family history on file.  HEALTH MAINTENANCE: Social History   Tobacco Use   Smoking status: Never   Smokeless tobacco: Never  Vaping Use   Vaping Use: Never used  Substance Use Topics   Alcohol use: No    Alcohol/week: 0.0 standard drinks of alcohol   Drug use: No     Allergies  Allergen Reactions   Other Swelling     Allergic Reaction to all nuts/ingredients   Influenza Vac Split Quad Other (See Comments)    48 hours of fever, myalgias    Benadryl [Diphenhydramine Hcl]     Seizure   Montelukast Swelling   Ropinirole Swelling    Itching,    Zyrtec [Cetirizine Hcl]     Seizure    Peanut-Containing Drug Products Rash    Current Outpatient Medications  Medication Sig Dispense Refill   folic acid (FOLVITE) 1 MG tablet Take 1 tablet (1 mg total) by mouth daily. 30 tablet 2   ALPRAZolam (XANAX) 0.25 MG tablet TAKE 1 TABLET BY MOUTH 2 TIMES DAILY AS NEEDED FOR ANXIETY. 45 tablet 5   chlorhexidine (PERIDEX) 0.12 % solution 15 mLs 2 (two) times daily.     diazepam (DIASTAT ACUDIAL) 10 MG GEL Place 20 mg  rectally once.     diazepam (VALIUM) 2 MG tablet 1 tablet in the am,  2 tablets in the PM 90 tablet 5   EPINEPHrine 0.3 mg/0.3 mL IJ SOAJ injection Inject 0.3 mg into the muscle as needed for anaphylaxis. 1 each 1   LAMICTAL 100 MG tablet Take 100 mg by mouth 2 (two) times daily.      levETIRAcetam (KEPPRA) 750 MG tablet Take 3 tablets (2,250 mg total) by mouth daily. TAKE 1 and a half pills twice a day 90 tablet 0   melatonin 5 MG TABS Take 5 mg by mouth.     PARoxetine (PAXIL) 20 MG tablet Take 1 tablet (20 mg total) by mouth 2 (two) times daily. 180 tablet 3   QUEtiapine  (SEROQUEL) 25 MG tablet Take 1 tablet (25 mg total) by mouth 2 (two) times daily. 180 tablet 1   traZODone (DESYREL) 50 MG tablet Take 0.5 tablets (25 mg total) by mouth at bedtime. 90 tablet 1   VALTOCO 20 MG DOSE 10 MG/0.1ML LQPK Place into both nostrils.     No current facility-administered medications for this visit.    OBJECTIVE: There were no vitals filed for this visit.    There is no height or weight on file to calculate BMI.      General: Well-developed, well-nourished, no acute distress. Eyes: Pink conjunctiva, anicteric sclera. HEENT: Normocephalic, moist mucous membranes, clear oropharnyx. Lungs: Clear to auscultation bilaterally. Heart: Regular rate and rhythm. No rubs, murmurs, or gallops. Abdomen: Soft, nontender, nondistended. No organomegaly noted, normoactive bowel sounds. Musculoskeletal: No edema, cyanosis, or clubbing. Neuro: Alert, answering all questions appropriately. Cranial nerves grossly intact. Skin: No rashes or petechiae noted. Psych: Normal affect. Lymphatics: No cervical, calvicular, axillary or inguinal LAD.   LAB RESULTS:  Lab Results  Component Value Date   NA 133 (L) 03/19/2022   K 3.8 03/19/2022   CL 99 03/19/2022   CO2 27 03/19/2022   GLUCOSE 92 03/19/2022   BUN 7 03/19/2022   CREATININE 0.68 03/19/2022   CALCIUM 9.1 03/19/2022   PROT 6.7 03/19/2022   ALBUMIN 4.0 03/19/2022   AST 13 03/19/2022   ALT 7 03/19/2022   ALKPHOS 92 03/19/2022   BILITOT 0.4 03/19/2022   GFRNONAA >60 06/10/2021   GFRAA >60 03/10/2018    Lab Results  Component Value Date   WBC 3.0 (L) 03/27/2022   NEUTROABS 1.8 03/27/2022   HGB 13.9 03/27/2022   HCT 41.4 03/27/2022   MCV 88.5 03/27/2022   PLT 171 03/27/2022    Lab Results  Component Value Date   FERRITIN 14.7 09/10/2012   IRONPCTSAT 31.4 10/05/2014     STUDIES: No results found.  ASSESSMENT AND PLAN:   Lindsey [name] is a 36 y.o. female with pmh of cerebral palsy, nonverbal, epilepsy  who was referred to hematology for leukopenia and thrombocytopenia.  # Leukopenia #Thrombocytopenia - CBC from 03/19/2022 showed WBC 2.1, ANC 1.1, hemoglobin 12.8 and platelets 131.  She had intermittent mild leukopenia in 2021 and 2022 which had resolved.   -I spoke with the mother to discuss the lab results.  Platelets normalized, WBC 3.0.  Folic acid level is low and patient does not eat greens.  Sent prescription for Folvite 1 mg daily for 3 months.  She can have folate levels checked with Dr. Darrick Huntsman in 3 months.  Other workup including B12 level, hepatitis B/C and flow cytometry came back normal.  Her leukopenia is likely related to folate deficiency and  primarily from medications.  Phenytoin can cause leukopenia.  However no changes in medications required since leukopenia is mild and ANC has always been more than 1000.  She can continue to follow with Dr. Darrick Huntsman for annual monitoring of CBC with differential.  I will be happy to see her again if WBC continues to trend down and or she develops symptoms such as but not limited to increased infections, weight loss, night sweats, fevers, lymphadenopathy.   I will forward my assessment to Dr. Darrick Huntsman.  Thank you for your referral.   RTC PRN  Patient expressed understanding and was in agreement with this plan. She also understands that She can call clinic at any time with any questions, concerns, or complaints.   I spent a total of 30 minutes reviewing chart data, face-to-face evaluation with the patient, counseling and coordination of care as detailed above.  Michaelyn Barter, MD   04/05/2022 10:02 AM

## 2022-04-12 ENCOUNTER — Encounter: Payer: Self-pay | Admitting: Internal Medicine

## 2022-04-12 ENCOUNTER — Telehealth: Payer: 59 | Admitting: Physician Assistant

## 2022-04-12 DIAGNOSIS — R300 Dysuria: Secondary | ICD-10-CM

## 2022-04-12 DIAGNOSIS — R3989 Other symptoms and signs involving the genitourinary system: Secondary | ICD-10-CM

## 2022-04-12 NOTE — Progress Notes (Signed)
   Thank you for the details you included in the comment boxes. Those details are very helpful in determining the best course of treatment for you and help [place] to provide the best care.Because of higher risks of more complicated infections, we recommend that you convert this visit to a video visit in order for the provider to better assess what is going on.  The provider will be able to give you a more accurate diagnosis and treatment plan if we can more freely discuss your symptoms and with the addition of a virtual examination.   If you convert to a video visit, we will bill your insurance (similar to an office visit) and you will not be charged for this e-Visit. You will be able to stay at home and speak with the first available [facility] advanced practice provider. The link to do a video visit is in the drop down Menu tab of your Welcome screen in MyChart.

## 2022-04-12 NOTE — Progress Notes (Signed)
Patient went for in person evaluation. No charge.

## 2022-04-13 ENCOUNTER — Encounter: Payer: Self-pay | Admitting: Emergency Medicine

## 2022-04-13 ENCOUNTER — Emergency Department: Payer: 59

## 2022-04-13 ENCOUNTER — Ambulatory Visit: Payer: 59 | Admitting: Family

## 2022-04-13 ENCOUNTER — Emergency Department
Admission: EM | Admit: 2022-04-13 | Discharge: 2022-04-13 | Disposition: A | Discharge status: Home / Self Care | Payer: 59 | Attending: Emergency Medicine | Admitting: Emergency Medicine

## 2022-04-13 ENCOUNTER — Other Ambulatory Visit: Payer: Self-pay

## 2022-04-13 DIAGNOSIS — N390 Urinary tract infection, site not specified: Secondary | ICD-10-CM | POA: Insufficient documentation

## 2022-04-13 DIAGNOSIS — R300 Dysuria: Secondary | ICD-10-CM

## 2022-04-13 DIAGNOSIS — R4689 Other symptoms and signs involving appearance and behavior: Secondary | ICD-10-CM

## 2022-04-13 HISTORY — DX: Ocular torticollis: R29.891

## 2022-04-13 HISTORY — DX: Unspecified intellectual disabilities: F79

## 2022-04-13 HISTORY — DX: Vertical strabismus, left eye: H50.22

## 2022-04-13 HISTORY — DX: Restlessness and agitation: R45.1

## 2022-04-13 HISTORY — DX: Localization-related (focal) (partial) symptomatic epilepsy and epileptic syndromes with complex partial seizures, intractable, without status epilepticus: G40.219

## 2022-04-13 LAB — CBC WITH DIFFERENTIAL/PLATELET
Abs Immature Granulocytes: 0.01 10*3/uL (ref 0.00–0.07)
Basophils Absolute: 0 10*3/uL (ref 0.0–0.1)
Basophils Relative: 0 %
Eosinophils Absolute: 0 10*3/uL (ref 0.0–0.5)
Eosinophils Relative: 0 %
HCT: 37.1 % (ref 36.0–46.0)
Hemoglobin: 12.5 g/dL (ref 12.0–15.0)
Immature Granulocytes: 0 %
Lymphocytes Relative: 19 %
Lymphs Abs: 0.8 10*3/uL (ref 0.7–4.0)
MCH: 30 pg (ref 26.0–34.0)
MCHC: 33.7 g/dL (ref 30.0–36.0)
MCV: 89 fL (ref 80.0–100.0)
Monocytes Absolute: 0.3 10*3/uL (ref 0.1–1.0)
Monocytes Relative: 6 %
Neutro Abs: 3.1 10*3/uL (ref 1.7–7.7)
Neutrophils Relative %: 75 %
Platelets: 159 10*3/uL (ref 150–400)
RBC: 4.17 MIL/uL (ref 3.87–5.11)
RDW: 13.4 % (ref 11.5–15.5)
WBC: 4.2 10*3/uL (ref 4.0–10.5)
nRBC: 0 % (ref 0.0–0.2)

## 2022-04-13 LAB — COMPREHENSIVE METABOLIC PANEL
ALT: 10 U/L (ref 0–44)
AST: 21 U/L (ref 15–41)
Albumin: 3.9 g/dL (ref 3.5–5.0)
Alkaline Phosphatase: 87 U/L (ref 38–126)
Anion gap: 5 (ref 5–15)
BUN: 8 mg/dL (ref 6–20)
CO2: 25 mmol/L (ref 22–32)
Calcium: 8.8 mg/dL — ABNORMAL LOW (ref 8.9–10.3)
Chloride: 103 mmol/L (ref 98–111)
Creatinine, Ser: 0.65 mg/dL (ref 0.44–1.00)
GFR, Estimated: >60 mL/min (ref >60)
Glucose, Bld: 93 mg/dL (ref 70–99)
Potassium: 3.2 mmol/L — ABNORMAL LOW (ref 3.5–5.1)
Sodium: 133 mmol/L — ABNORMAL LOW (ref 135–145)
Total Bilirubin: 0.5 mg/dL (ref 0.3–1.2)
Total Protein: 6.9 g/dL (ref 6.5–8.1)

## 2022-04-13 LAB — URINALYSIS, COMPLETE (UACMP) WITH MICROSCOPIC
Bilirubin Urine: NEGATIVE
Glucose, UA: NEGATIVE mg/dL
Ketones, ur: NEGATIVE mg/dL
Nitrite: POSITIVE — AB
Protein, ur: NEGATIVE mg/dL
Specific Gravity, Urine: 1.005 (ref 1.005–1.030)
pH: 7 (ref 5.0–8.0)

## 2022-04-13 LAB — PREGNANCY, URINE: Preg Test, Ur: NEGATIVE

## 2022-04-13 MED ORDER — LORAZEPAM 2 MG/ML IJ SOLN
2.0000 mg | Freq: Once | INTRAMUSCULAR | Status: AC
  Administered 2022-04-13: 2 mg via INTRAVENOUS
  Filled 2022-04-13: qty 1

## 2022-04-13 MED ORDER — SODIUM CHLORIDE 0.9 % IV SOLN
1.0000 g | Freq: Once | INTRAVENOUS | Status: AC
  Administered 2022-04-13: 1 g via INTRAVENOUS
  Filled 2022-04-13: qty 10

## 2022-04-13 MED ORDER — MIDAZOLAM HCL 2 MG/2ML IJ SOLN
INTRAMUSCULAR | Status: AC
  Filled 2022-04-13: qty 2

## 2022-04-13 MED ORDER — CEFTRIAXONE SODIUM 1 G IJ SOLR
1.0000 g | Freq: Once | INTRAMUSCULAR | Status: AC
  Administered 2022-04-13: 1 g via INTRAMUSCULAR
  Filled 2022-04-13: qty 10

## 2022-04-13 MED ORDER — LEVETIRACETAM 750 MG PO TABS
1125.0000 mg | ORAL_TABLET | Freq: Once | ORAL | Status: DC
  Filled 2022-04-13 (×3): qty 1.5

## 2022-04-13 MED ORDER — LAMOTRIGINE 100 MG PO TABS
100.0000 mg | ORAL_TABLET | ORAL | Status: DC
  Filled 2022-04-13: qty 1

## 2022-04-13 MED ORDER — DIPHENHYDRAMINE HCL 50 MG/ML IJ SOLN
INTRAMUSCULAR | Status: AC
  Filled 2022-04-13: qty 1

## 2022-04-13 MED ORDER — IOHEXOL 300 MG/ML  SOLN
100.0000 mL | Freq: Once | INTRAMUSCULAR | Status: AC | PRN
  Administered 2022-04-13: 100 mL via INTRAVENOUS

## 2022-04-13 MED ORDER — CEPHALEXIN 500 MG PO CAPS: 500.0000 mg | ORAL_CAPSULE | Freq: Two times a day (BID) | ORAL | 0 refills | Status: DC

## 2022-04-13 MED ORDER — DROPERIDOL 2.5 MG/ML IJ SOLN
5.0000 mg | Freq: Once | INTRAMUSCULAR | Status: AC
  Administered 2022-04-13: 5 mg via INTRAMUSCULAR

## 2022-04-13 MED ORDER — LIDOCAINE HCL (PF) 1 % IJ SOLN
INTRAMUSCULAR | Status: AC
  Administered 2022-04-13: 2.1 mg
  Filled 2022-04-13: qty 5

## 2022-04-13 NOTE — ED Provider Notes (Signed)
-----------------------------------------   8:49 AM on 04/13/2022 ----------------------------------------- Patient was unable to obtain CT imaging due to agitation would not lay still on the CT scanner.  Patient given additional 2 mg Ativan IV.  We will attempt to obtain CT imaging once again.  Mom agreeable to plan.  Patient's urinalysis does show urinary tract infection with many bacteria and nitrite positive we will dose 1 g of IV Rocephin and send a urine culture.  CT scan of the C-spine shows widening of C4-C5 with retrolisthesis concerning for possible ligamentous injury.  Mom states the patient did have a fall in the hallway earlier this morning but no other falls or significant trauma known.  Patient is unable to give any reliable exam.  She states it hurts when you ask her regardless of where you push on her body including the C-spine.  Was very difficult to get the CT scans given severe MR and agitation.  Patient has a vagus nerve stimulator and would likely not be able to get an MRI.  Mom states there is no way she would tolerate any sort of c-collar.  We will discuss with neurosurgery for any further recommendations.  CT scan of the abdomen and pelvis is negative for acute abnormality.  Awaiting neurosurgical consultation, I spoke to Dr. Osborne Oman nurse but he is currently scrubbed in the OR will review CT imaging once he is done.  I spoke with Dr. Marcell Barlow at length regarding the patient, she is not able to get an MRI due to a vagus nerve stimulator per mom.  Mom states she was told in the past that she is unable to get MRIs going forward when she needed an MRI of the shoulder.  Mom did state a fall in the hallway this morning but does not believe it was a significant fall.  Mom states that is unrelated to the reason they came to the emergency department effectuate already called 911 when the fall happened.  Main concern was of dysuria and agitation.  Patient does have a urinary tract  infection.  Does not appear to have tenderness to the neck, she does not respond or react to palpation of the neck but she will shake her head yes when asked if it hurts regardless of where you push such as the neck elbows knees, etc.  Very difficult decisions but I had a very long discussion with mom regarding the option to possibly go to a tertiary care center where she can be monitored and sedated throughout the MRI.  Mom states she does not want to pursue this, believes the fall was minimal.  Mom is asking to be discharged home which I believe is reasonable.  Will place patient on antibiotics.  Mom will continue to closely monitor at home.   Minna Antis, MD 04/13/22 1253

## 2022-04-13 NOTE — ED Triage Notes (Signed)
Pt to ED via EMS from home with mom c/o dysuria and hematuria since yesterday.  Mom states patient has been acting more aggressive at home, throwing objects, c/o pain with urination and blood in urine.  Seen at [facility] yesterday and UA done without signs of infection, started on pyridium yesterday.  Mom states patient also fell today at home PTA.  Mom states patient not acting at baseline, asking repetitive questions.  Pt denies pain currently.

## 2022-04-13 NOTE — ED Provider Notes (Signed)
[facility] Provider Note    Event Date/Time   First MD Initiated Contact with Patient 04/13/22 0501     (approximate)   History   Dysuria   HPI  Lindsey [name] is a 36 y.o. female who is severely impaired at baseline secondary to cerebral palsy, chronic epileptic disorder status post temporal lobe and possibly frontal lobe resection, severe intellectual disability, and other unspecified issues.  She arrives by EMS with her mother for evaluation of apparent pain, dysuria, recent hematuria at urgent care, and worsening behavioral issues.  Her mother states that the patient has been nearly unmanageable for the last week.  She has been crying out and started indicating that she has pain when she urinates.  [facility] and they said that there was "3+ blood in the urine" but no obvious sign of infection.  They gave her a prescription for Pyridium but no antibiotics.  The patient's behavior has gotten worse since that time and she is crying out more.  She "put on a scene" at home and threw herself on the ground, striking her head and hitting her left arm.  She keeps crying out now but is redirectable.  Her mother said that this is abnormal behavior.  They usually go to [facility] for their care including neurology.     Physical Exam   Triage Vital Signs: ED Triage Vitals  Enc Vitals Group     BP 04/13/22 0451 111/89     Pulse Rate 04/13/22 0451 96     Resp 04/13/22 0451 16     Temp 04/13/22 0451 98.4 F (36.9 C)     Temp Source 04/13/22 0451 Oral     SpO2 04/13/22 0451 97 %     Weight 04/13/22 0452 59 kg (130 lb)     Height 04/13/22 0452 1.676 m (5\' 6" )     Head Circumference --      Peak Flow --      Pain Score --      Pain Loc --      Pain Edu? --      Excl. in GC? --     Most recent vital signs: Vitals:   04/13/22 0451  BP: 111/89  Pulse: 96  Resp: 16  Temp: 98.4 F (36.9 C)  SpO2: 97%     General: Awake, alert, tracks people  in the room, will respond but is crying and yelling out regularly. CV:  Good peripheral perfusion.  Borderline tachycardia. Resp:  Normal effort.  Lungs are clear to auscultation. Abd:  No distention.  Difficult to appreciate tenderness to palpation because of the way the patient is crying out. Other:  Small hematoma on forehead.  No gross deformity to left upper arm but patient indicates pain in that area.  Normal range of motion.   ED Results / Procedures / Treatments   Labs (all labs ordered are listed, but only abnormal results are displayed) Labs Reviewed  COMPREHENSIVE METABOLIC PANEL - Abnormal; Notable for the following components:      Result Value   Sodium 133 (*)    Potassium 3.2 (*)    Calcium 8.8 (*)    All other components within normal limits  URINE CULTURE  CBC WITH DIFFERENTIAL/PLATELET  PREGNANCY, URINE  URINALYSIS, COMPLETE (UACMP) WITH MICROSCOPIC     EKG  I agree viewed and interpreted an EKG from 11/21/2020 that shows a QTc that was essentially normal, certainly less than 500.  RADIOLOGY I viewed and interpreted the patient's left humerus x-rays.  I see no evidence of shoulder dislocation or fracture of the visualized bones in the image.  CT scans of the head, C-spine, abdomen/pelvis are pending at time of transfer of care.    PROCEDURES:  Critical Care performed: No  Procedures   MEDICATIONS ORDERED IN ED: Medications  levETIRAcetam (KEPPRA) tablet 1,125 mg (has no administration in time range)  lamoTRIgine (LAMICTAL) tablet 100 mg (has no administration in time range)  droperidol (INAPSINE) 2.5 MG/ML injection 5 mg (5 mg Intramuscular Given 04/13/22 0544)  diphenhydrAMINE (BENADRYL) 50 MG/ML injection (  Given 04/13/22 0601)  midazolam (VERSED) 2 MG/2ML injection (  Given 04/13/22 0601)  LORazepam (ATIVAN) injection 2 mg (2 mg Intravenous Given 04/13/22 0641)  iohexol (OMNIPAQUE) 300 MG/ML solution 100 mL (100 mLs Intravenous Contrast Given  04/13/22 0711)     IMPRESSION / MDM / ASSESSMENT AND PLAN / ED COURSE  I reviewed the triage vital signs and the nursing notes.                              Differential diagnosis includes, but is not limited to, urinary tract infection, pyelonephritis, renal or metabolic disturbance, electrolyte abnormality, acute intra-abdominal infection, renal/ureteral colic.  Patient's presentation is most consistent with acute presentation with potential threat to life or bodily function.  Lab/studies ordered: CT of the head, C-spine, abdomen/pelvis, x-ray of left humerus, CBC with differential, CMP, urine pregnancy, urine culture, urinalysis.  The patient is on the cardiac monitor to evaluate for evidence of arrhythmia and/or significant heart rate changes.  This is a challenging situation given the patient's chronic disabilities and inability to adequately articulate what is bothering her.  There is almost certainly an element of behavioral disturbance (not psychiatric but behavioral) that is complicating the matter.  The mother is understandably frustrated and left the room for a break.  At that time the patient was willing to talk and interact with me.  She indicated that she has pain in her left upper arm and denied pain in her chest and abdomen.  She has not been vomiting recently.  When I ask her what is upsetting her, she referred to some problem with her grandmother and grandfather.  I asked her if anyone is hurting her at home and she said no.  I specifically ask about either her grandfather, grandmother, or mother hurting her, and she denied all of these issues.  When her mother return, I asked her about the grandmother grandfather, and apparently both of them have been having health issues recently and the patient has been very focused on those issues.  However, it is also documented that the patient was having hemoglobinuria at the [facility] and the urine culture is not yet available.  Given  the patient's tachycardia and obvious difference from baseline, I cannot safely rule out any acute or emergent medical condition such as a UTI that has developed into pyelonephritis, the presence of a ureteral stone, possibly infected, etc.  I talked with the patient's mother and we discussed risks and benefits of calming agents as well as additional evaluation and workup.  She agreed that we can give medicines understanding that it may compromise her respiratory status.  I ordered droperidol 5 mg intramuscular to be administered prior to trying to start an IV.  The patient actually calm down briefly but then seemed to become more agitated.  Concerned  that she may be having a dystonic reaction, I ordered Versed 2 mg IV and diphenhydramine 25 mg IV.  She calm down for an extended period of time but then started getting more agitated again, trying to pull off her equipment and her IV, getting out of bed which would be a substantial fall risk.  At that point I ordered Ativan 2 mg IV, which then seem to calm her down.  Given her known epilepsy and multiple antiepileptic agents which she has not had yet this morning, I want to try to avoid the possibility she is going to have a new seizure this morning, so I ordered her home doses of Keppra 1125 mg p.o. and Lamictal 100 mg p.o. (if she is awake enough to safely take them).  Mother understands that in the absence of any acute or emergent medical issue, she will be discharged home. Labs and imaging are pending.     Clinical Course as of 04/13/22 0720  Fri Apr 13, 2022  1017 Labs are generally reassuring with a very slight hyponatremia and hypokalemia but which are unlikely to be clinically significant.  CBC is essentially normal. [CF]  [id number] Transferring ED care to Dr. Lenard Lance to follow up on imaging and reassess.  As previously documented, anticipate discharge in the absence of any acute or emergent issue requiring hospitalization.  I explained this to  the patient's mother, that she will be discharged if her workup is reassuring, and the mother states that she understands. [CF]  0719 Preg Test, Ur: NEGATIVE [CF]    Clinical Course User Index [CF] Loleta Rose, MD     FINAL CLINICAL IMPRESSION(S) / ED DIAGNOSES   Final diagnoses:  Abnormal behavior  Dysuria     Rx / DC Orders   ED Discharge Orders     None        Note:  This document was prepared using Dragon voice recognition software and may include unintentional dictation errors.   Loleta Rose, MD 04/13/22 [phone]

## 2022-04-13 NOTE — ED Notes (Signed)
Pt is hard stick; provider Paduchowski notified pt pulled her IV out and did not receive full dose of rocephin IV. Awaiting orders.

## 2022-04-14 LAB — URINE CULTURE

## 2022-04-20 ENCOUNTER — Encounter: Payer: Self-pay | Admitting: Internal Medicine

## 2022-04-20 ENCOUNTER — Inpatient Hospital Stay: Payer: 59 | Admitting: Internal Medicine

## 2022-04-20 ENCOUNTER — Ambulatory Visit (INDEPENDENT_AMBULATORY_CARE_PROVIDER_SITE_OTHER): Payer: 59 | Admitting: Internal Medicine

## 2022-04-20 VITALS — BP 112/76 | HR 108 | Temp 97.9°F | Ht 66.0 in | Wt 130.2 lb

## 2022-04-20 DIAGNOSIS — R4689 Other symptoms and signs involving appearance and behavior: Secondary | ICD-10-CM | POA: Diagnosis not present

## 2022-04-20 DIAGNOSIS — N390 Urinary tract infection, site not specified: Secondary | ICD-10-CM | POA: Diagnosis not present

## 2022-04-20 DIAGNOSIS — R451 Restlessness and agitation: Secondary | ICD-10-CM | POA: Diagnosis not present

## 2022-04-20 LAB — URINALYSIS, ROUTINE W REFLEX MICROSCOPIC
Bilirubin Urine: NEGATIVE
Ketones, ur: NEGATIVE
Leukocytes,Ua: NEGATIVE
Nitrite: NEGATIVE
Specific Gravity, Urine: 1.02 (ref 1.000–1.030)
Total Protein, Urine: 100 — AB
Urine Glucose: NEGATIVE
Urobilinogen, UA: 1 (ref 0.0–1.0)
pH: 7 (ref 5.0–8.0)

## 2022-04-20 MED ORDER — ALPRAZOLAM 0.5 MG PO TABS: 0.2500 mg | ORAL_TABLET | Freq: Two times a day (BID) | ORAL | 2 refills | Status: DC | PRN

## 2022-04-20 MED ORDER — CIPROFLOXACIN HCL 250 MG PO TABS: 250.0000 mg | ORAL_TABLET | Freq: Two times a day (BID) | ORAL | 0 refills | Status: AC

## 2022-04-20 MED ORDER — PAROXETINE HCL 30 MG PO TABS: 30.0000 mg | ORAL_TABLET | Freq: Every day | ORAL | 1 refills | Status: DC

## 2022-04-20 NOTE — Patient Instructions (Addendum)
Increase paxil to 30 mg daily   Continue seroquel twice daily at 25 mg (lowest dose) for now   Use alprazolam twice daily to if needed for hand biting  or other signs of agitation , ok to give 0.5 mg  (new rx sent )  The next time she develops sign or symptoms of UTI ,  try to collect a urine and START HER ON CIPRO

## 2022-04-20 NOTE — Progress Notes (Unsigned)
Subjective:  Patient ID: Lindsey [name], female    DOB: Oct 28, 1985  Age: 36 y.o. MRN: [id number]  CC: There were no encounter diagnoses.   HPI Lindsey [name] presents for ER FOLLOW UP  Chief Complaint  Patient presents with   Annual Exam    Physical    36 yr old female with cerebral palsy,  seizure disorder, history of partial right temporal  lobe resection remotely,  s/p vagus nerve stimulator ,  worsening behavioral issues presents with other following and ER visit on Dec 8 after sustaining  blunt head trauma .  She had an urgent care visit the day before for suspected  UTI,  was given pyridium  pending urine culture.  She developed agitation the following morning becmase very aggressive,  was taken by ambulance to ER  Labs and CT head neck and pelvis done. Neurosurgery eval done.  , CMET was notable for stable sodium (133) ,  low potassium (3.2) , hematuria and bacteruria without UTI  on culture but was treated empirically with rocephin and . Sent hoe with cephalexin    Ligamentous injury suspected involving C 4-5 on ct cervical spine No SAH or SDH,  no skull fracture  'nonobstructing stones bilaterally    Outpatient Medications Prior to Visit  Medication Sig Dispense Refill   ALPRAZolam (XANAX) 0.25 MG tablet TAKE 1 TABLET BY MOUTH 2 TIMES DAILY AS NEEDED FOR ANXIETY. 45 tablet 5   cephALEXin (KEFLEX) 500 MG capsule Take 1 capsule (500 mg total) by mouth 2 (two) times daily. 14 capsule 0   chlorhexidine (PERIDEX) 0.12 % solution 15 mLs 2 (two) times daily.     diazepam (DIASTAT ACUDIAL) 10 MG GEL Place 20 mg rectally once.     diazepam (VALIUM) 2 MG tablet 1 tablet in the am,  2 tablets in the PM 90 tablet 5   EPINEPHrine 0.3 mg/0.3 mL IJ SOAJ injection Inject 0.3 mg into the muscle as needed for anaphylaxis. 1 each 1   folic acid (FOLVITE) 1 MG tablet Take 1 tablet (1 mg total) by mouth daily. 30 tablet 2   LAMICTAL 100 MG tablet Take 100 mg by mouth 2 (two) times daily.       levETIRAcetam (KEPPRA) 750 MG tablet Take 3 tablets (2,250 mg total) by mouth daily. TAKE 1 and a half pills twice a day 90 tablet 0   melatonin 5 MG TABS Take 5 mg by mouth.     PARoxetine (PAXIL) 20 MG tablet Take 1 tablet (20 mg total) by mouth 2 (two) times daily. 180 tablet 3   QUEtiapine (SEROQUEL) 25 MG tablet Take 1 tablet (25 mg total) by mouth 2 (two) times daily. 180 tablet 1   traZODone (DESYREL) 50 MG tablet Take 0.5 tablets (25 mg total) by mouth at bedtime. 90 tablet 1   VALTOCO 20 MG DOSE 10 MG/0.1ML LQPK Place into both nostrils.     No facility-administered medications prior to visit.    Review of Systems;  Patient denies headache, fevers, malaise, unintentional weight loss, skin rash, eye pain, sinus congestion and sinus pain, sore throat, dysphagia,  hemoptysis , cough, dyspnea, wheezing, chest pain, palpitations, orthopnea, edema, abdominal pain, nausea, melena, diarrhea, constipation, flank pain, dysuria, hematuria, urinary  Frequency, nocturia, numbness, tingling, seizures,  Focal weakness, Loss of consciousness,  Tremor, insomnia, depression, anxiety, and suicidal ideation.      Objective:  BP 112/76   Pulse (!) 108   Temp 97.9 F (36.6 C) (  Oral)   Ht 5\' 6"  (1.676 m)   Wt 130 lb 3.2 oz (59.1 kg)   LMP 01/09/2011   SpO2 97%   BMI 21.01 kg/m   BP Readings from Last 3 Encounters:  04/20/22 112/76  04/13/22 (!) 93/56  03/27/22 116/86    Wt Readings from Last 3 Encounters:  04/20/22 130 lb 3.2 oz (59.1 kg)  04/13/22 130 lb (59 kg)  03/27/22 133 lb (60.3 kg)    Physical Exam  Lab Results  Component Value Date   HGBA1C 4.7 03/19/2022   HGBA1C 4.9 03/16/2021    Lab Results  Component Value Date   CREATININE 0.65 04/13/2022   CREATININE 0.68 03/19/2022   CREATININE 0.63 06/10/2021    Lab Results  Component Value Date   WBC 4.2 04/13/2022   HGB 12.5 04/13/2022   HCT 37.1 04/13/2022   PLT 159 04/13/2022   GLUCOSE 93 04/13/2022   CHOL 129  03/19/2022   TRIG 78.0 03/19/2022   HDL 32.30 (L) 03/19/2022   LDLCALC 81 03/19/2022   ALT 10 04/13/2022   AST 21 04/13/2022   NA 133 (L) 04/13/2022   K 3.2 (L) 04/13/2022   CL 103 04/13/2022   CREATININE 0.65 04/13/2022   BUN 8 04/13/2022   CO2 25 04/13/2022   TSH 1.51 03/19/2022   HGBA1C 4.7 03/19/2022    CT ABDOMEN PELVIS W CONTRAST  Result Date: 04/13/2022 CLINICAL DATA:  Dysuria and hematuria EXAM: CT ABDOMEN AND PELVIS WITH CONTRAST TECHNIQUE: Multidetector CT imaging of the abdomen and pelvis was performed using the standard protocol following bolus administration of intravenous contrast. RADIATION DOSE REDUCTION: This exam was performed according to the departmental dose-optimization program which includes automated exposure control, adjustment of the mA and/or kV according to patient size and/or use of iterative reconstruction technique. CONTRAST:  14/12/2021 OMNIPAQUE IOHEXOL 300 MG/ML  SOLN COMPARISON:  None Available. FINDINGS: Lower chest: No focal consolidation or pulmonary nodule in the lung bases. No pleural effusion or pneumothorax demonstrated. Partially imaged heart size is normal. Partially imaged implanted device in the left anterior chest wall. Hepatobiliary: No focal hepatic lesions. No intra or extrahepatic biliary ductal dilation. Normal gallbladder. Pancreas: No focal lesions or main ductal dilation. Spleen: Normal in size without focal abnormality. Adrenals/Urinary Tract: No adrenal nodules. No suspicious renal mass or hydronephrosis. Punctate nonobstructing stone in the left upper pole and right lower pole. No focal bladder wall thickening. Stomach/Bowel: Normal appearance of the stomach. No evidence of bowel wall thickening, distention, or inflammatory changes. Normal appendix (5:43). Redundant sigmoid colon extends into the right lower quadrant. Vascular/Lymphatic: No significant vascular findings are present. No enlarged abdominal or pelvic lymph nodes. Reproductive: No  adnexal masses. Other: No free fluid, fluid collection, or free air. Musculoskeletal: No acute or abnormal lytic or blastic osseous lesions. Mild anterior wedging of the T11 vertebral body. IMPRESSION: 1. No acute abdominopelvic findings. 2. Punctate nonobstructing bilateral renal calculi. Electronically Signed   By: M.D.   On: 04/13/2022 11:03   CT Cervical Spine Wo Contrast  Result Date: 04/13/2022 CLINICAL DATA:  Recent trauma EXAM: CT CERVICAL SPINE WITHOUT CONTRAST TECHNIQUE: Multidetector CT imaging of the cervical spine was performed without intravenous contrast. Multiplanar CT image reconstructions were also generated. RADIATION DOSE REDUCTION: This exam was performed according to the departmental dose-optimization program which includes automated exposure control, adjustment of the mA and/or kV according to patient size and/or use of iterative reconstruction technique. COMPARISON:  CT C Spine 01/02/22 FINDINGS: Alignment:  Compared to prior exam there is increased widening of the C4-C5 disc space with new retrolisthesis of C4 on C5. There is likely also mild widening of the facet joints at this level. Skull base and vertebrae: There may also be a subtle posterosuperior endplate compression deformity (series 5, image 50). Soft tissues and spinal canal: No prevertebral fluid or swelling. No visible canal hematoma. Disc levels:  No CT evidence of high-grade spinal canal stenosis. Upper chest: Negative. Other: None. IMPRESSION: 1. Compared to prior exam there is increased widening of the C4-C5 anterior disc space with new retrolisthesis of C4 on C5. Findings are worrisome for ligamentous injury. Recommend MRI of the cervical spine for further evaluation. 2. No CT evidence of high-grade spinal canal stenosis. Findings were discussed with Dr. Gevena Barre on 04/13/22 at 10:51 AM Electronically Signed   By: Marin Roberts M.D.   On: 04/13/2022 10:57   CT Head Wo Contrast  Result Date:  04/13/2022 CLINICAL DATA:  Provided history: Head trauma, intracranial venous injury suspected. EXAM: CT HEAD WITHOUT CONTRAST TECHNIQUE: Contiguous axial images were obtained from the base of the skull through the vertex without intravenous contrast. RADIATION DOSE REDUCTION: This exam was performed according to the departmental dose-optimization program which includes automated exposure control, adjustment of the mA and/or kV according to patient size and/or use of iterative reconstruction technique. COMPARISON:  Prior head CT examinations 01/02/2022 and earlier. FINDINGS: Brain: No age advanced or lobar predominant parenchymal atrophy. Redemonstrated postoperative sequelae from prior partial right temporal lobe resection. There is no acute intracranial hemorrhage. No acute demarcated cortical infarct. No extra-axial fluid collection. No evidence of an intracranial mass. No midline shift. Vascular: No hyperdense vessel. Skull: No fracture or aggressive osseous lesion. Right-sided cranioplasty. Sinuses/Orbits: No mass or acute finding within the imaged orbits. No significant paranasal sinus disease at the imaged levels. IMPRESSION: No evidence of acute intracranial abnormality. Redemonstrated chronic postoperative sequelae from prior partial right temporal lobe resection. Electronically Signed   By: Kellie Simmering D.O.   On: 04/13/2022 09:21   DG Humerus Left  Result Date: 04/13/2022 CLINICAL DATA:  Fall.  Pain. EXAM: LEFT HUMERUS - 2+ VIEW COMPARISON:  None Available. FINDINGS: There is no evidence of fracture or other focal bone lesions. Soft tissues are unremarkable. IMPRESSION: Negative. Electronically Signed   By: Misty Stanley M.D.   On: 04/13/2022 07:24    Assessment & Plan:  .There are no diagnoses linked to this encounter.   I provided 30 minutes of face-to-face time during this encounter reviewing patient's last visit with me, patient's  most recent visit with cardiology,  nephrology,  and  neurology,  recent surgical and non surgical procedures, previous  labs and imaging studies, counseling on currently addressed issues,  and post visit ordering to diagnostics and therapeutics .   Follow-up: No follow-ups on file.   Crecencio Mc, MD

## 2022-04-21 ENCOUNTER — Other Ambulatory Visit: Payer: Self-pay | Admitting: Internal Medicine

## 2022-04-21 LAB — URINE CULTURE
MICRO NUMBER:: 14321574
Result:: NO GROWTH
SPECIMEN QUALITY:: ADEQUATE

## 2022-04-21 MED ORDER — PAROXETINE HCL 30 MG PO TABS: 30.0000 mg | ORAL_TABLET | Freq: Two times a day (BID) | ORAL | 1 refills | Status: DC

## 2022-04-22 MED ORDER — FLUCONAZOLE 150 MG PO TABS: 150.0000 mg | ORAL_TABLET | Freq: Every day | ORAL | 0 refills | Status: DC

## 2022-04-22 NOTE — Assessment & Plan Note (Signed)
Will increase paxil to 30 mg bid (maximal dose) .  Seroquel dose will be increased next.

## 2022-04-22 NOTE — Assessment & Plan Note (Signed)
Continue alprazolam bid.

## 2022-05-06 DIAGNOSIS — Z887 Allergy status to serum and vaccine status: Secondary | ICD-10-CM

## 2022-05-06 DIAGNOSIS — E871 Hypo-osmolality and hyponatremia: Secondary | ICD-10-CM | POA: Diagnosis not present

## 2022-05-06 DIAGNOSIS — R5381 Other malaise: Secondary | ICD-10-CM | POA: Diagnosis present

## 2022-05-06 DIAGNOSIS — E861 Hypovolemia: Secondary | ICD-10-CM | POA: Diagnosis present

## 2022-05-06 DIAGNOSIS — F71 Moderate intellectual disabilities: Secondary | ICD-10-CM | POA: Diagnosis present

## 2022-05-06 DIAGNOSIS — F419 Anxiety disorder, unspecified: Secondary | ICD-10-CM | POA: Diagnosis present

## 2022-05-06 DIAGNOSIS — Z888 Allergy status to other drugs, medicaments and biological substances status: Secondary | ICD-10-CM

## 2022-05-06 DIAGNOSIS — Z91018 Allergy to other foods: Secondary | ICD-10-CM

## 2022-05-06 DIAGNOSIS — E878 Other disorders of electrolyte and fluid balance, not elsewhere classified: Secondary | ICD-10-CM | POA: Diagnosis present

## 2022-05-06 DIAGNOSIS — U071 COVID-19: Secondary | ICD-10-CM | POA: Diagnosis not present

## 2022-05-06 DIAGNOSIS — Z91010 Allergy to peanuts: Secondary | ICD-10-CM

## 2022-05-06 DIAGNOSIS — D6959 Other secondary thrombocytopenia: Secondary | ICD-10-CM | POA: Diagnosis present

## 2022-05-06 DIAGNOSIS — J159 Unspecified bacterial pneumonia: Secondary | ICD-10-CM | POA: Diagnosis present

## 2022-05-06 DIAGNOSIS — Z90710 Acquired absence of both cervix and uterus: Secondary | ICD-10-CM

## 2022-05-06 DIAGNOSIS — G40109 Localization-related (focal) (partial) symptomatic epilepsy and epileptic syndromes with simple partial seizures, not intractable, without status epilepticus: Secondary | ICD-10-CM | POA: Diagnosis present

## 2022-05-06 DIAGNOSIS — G809 Cerebral palsy, unspecified: Secondary | ICD-10-CM | POA: Diagnosis present

## 2022-05-06 DIAGNOSIS — E872 Acidosis, unspecified: Secondary | ICD-10-CM | POA: Diagnosis present

## 2022-05-06 DIAGNOSIS — D72819 Decreased white blood cell count, unspecified: Secondary | ICD-10-CM | POA: Diagnosis present

## 2022-05-06 DIAGNOSIS — E860 Dehydration: Secondary | ICD-10-CM | POA: Diagnosis present

## 2022-05-06 DIAGNOSIS — Z79899 Other long term (current) drug therapy: Secondary | ICD-10-CM

## 2022-05-06 DIAGNOSIS — F32A Depression, unspecified: Secondary | ICD-10-CM | POA: Diagnosis present

## 2022-05-06 DIAGNOSIS — E876 Hypokalemia: Secondary | ICD-10-CM | POA: Diagnosis present

## 2022-05-07 ENCOUNTER — Inpatient Hospital Stay
Admission: EM | Admit: 2022-05-07 | Discharge: 2022-05-09 | DRG: 177 | Disposition: A | Discharge status: Home / Self Care | Payer: 59 | Attending: Obstetrics and Gynecology | Admitting: Obstetrics and Gynecology

## 2022-05-07 ENCOUNTER — Encounter: Payer: Self-pay | Admitting: Emergency Medicine

## 2022-05-07 ENCOUNTER — Emergency Department: Payer: 59

## 2022-05-07 ENCOUNTER — Other Ambulatory Visit: Payer: Self-pay

## 2022-05-07 DIAGNOSIS — G809 Cerebral palsy, unspecified: Secondary | ICD-10-CM | POA: Diagnosis present

## 2022-05-07 DIAGNOSIS — G40109 Localization-related (focal) (partial) symptomatic epilepsy and epileptic syndromes with simple partial seizures, not intractable, without status epilepticus: Secondary | ICD-10-CM | POA: Diagnosis present

## 2022-05-07 DIAGNOSIS — E871 Hypo-osmolality and hyponatremia: Secondary | ICD-10-CM | POA: Diagnosis present

## 2022-05-07 DIAGNOSIS — J189 Pneumonia, unspecified organism: Secondary | ICD-10-CM | POA: Diagnosis not present

## 2022-05-07 DIAGNOSIS — E872 Acidosis, unspecified: Secondary | ICD-10-CM | POA: Insufficient documentation

## 2022-05-07 DIAGNOSIS — R5381 Other malaise: Secondary | ICD-10-CM | POA: Diagnosis present

## 2022-05-07 DIAGNOSIS — E876 Hypokalemia: Secondary | ICD-10-CM | POA: Diagnosis not present

## 2022-05-07 DIAGNOSIS — Z79899 Other long term (current) drug therapy: Secondary | ICD-10-CM | POA: Diagnosis not present

## 2022-05-07 DIAGNOSIS — Z90710 Acquired absence of both cervix and uterus: Secondary | ICD-10-CM | POA: Diagnosis not present

## 2022-05-07 DIAGNOSIS — D72819 Decreased white blood cell count, unspecified: Secondary | ICD-10-CM | POA: Diagnosis present

## 2022-05-07 DIAGNOSIS — J9601 Acute respiratory failure with hypoxia: Secondary | ICD-10-CM

## 2022-05-07 DIAGNOSIS — F32A Depression, unspecified: Secondary | ICD-10-CM | POA: Insufficient documentation

## 2022-05-07 DIAGNOSIS — D61811 Other drug-induced pancytopenia: Secondary | ICD-10-CM | POA: Diagnosis present

## 2022-05-07 DIAGNOSIS — E878 Other disorders of electrolyte and fluid balance, not elsewhere classified: Secondary | ICD-10-CM | POA: Diagnosis present

## 2022-05-07 DIAGNOSIS — T4275XA Adverse effect of unspecified antiepileptic and sedative-hypnotic drugs, initial encounter: Secondary | ICD-10-CM | POA: Diagnosis present

## 2022-05-07 DIAGNOSIS — Z91010 Allergy to peanuts: Secondary | ICD-10-CM | POA: Diagnosis not present

## 2022-05-07 DIAGNOSIS — E860 Dehydration: Secondary | ICD-10-CM | POA: Diagnosis present

## 2022-05-07 DIAGNOSIS — D6959 Other secondary thrombocytopenia: Secondary | ICD-10-CM | POA: Diagnosis present

## 2022-05-07 DIAGNOSIS — D696 Thrombocytopenia, unspecified: Secondary | ICD-10-CM | POA: Diagnosis present

## 2022-05-07 DIAGNOSIS — F71 Moderate intellectual disabilities: Secondary | ICD-10-CM | POA: Diagnosis present

## 2022-05-07 DIAGNOSIS — Z888 Allergy status to other drugs, medicaments and biological substances status: Secondary | ICD-10-CM | POA: Diagnosis not present

## 2022-05-07 DIAGNOSIS — E861 Hypovolemia: Secondary | ICD-10-CM | POA: Diagnosis present

## 2022-05-07 DIAGNOSIS — J159 Unspecified bacterial pneumonia: Secondary | ICD-10-CM | POA: Diagnosis present

## 2022-05-07 DIAGNOSIS — Z887 Allergy status to serum and vaccine status: Secondary | ICD-10-CM | POA: Diagnosis not present

## 2022-05-07 DIAGNOSIS — A419 Sepsis, unspecified organism: Secondary | ICD-10-CM | POA: Diagnosis not present

## 2022-05-07 DIAGNOSIS — U071 COVID-19: Secondary | ICD-10-CM | POA: Diagnosis present

## 2022-05-07 DIAGNOSIS — G40909 Epilepsy, unspecified, not intractable, without status epilepticus: Secondary | ICD-10-CM

## 2022-05-07 DIAGNOSIS — R7989 Other specified abnormal findings of blood chemistry: Secondary | ICD-10-CM | POA: Insufficient documentation

## 2022-05-07 DIAGNOSIS — F419 Anxiety disorder, unspecified: Secondary | ICD-10-CM | POA: Insufficient documentation

## 2022-05-07 DIAGNOSIS — Z91018 Allergy to other foods: Secondary | ICD-10-CM | POA: Diagnosis not present

## 2022-05-07 HISTORY — DX: Pneumonia, unspecified organism: J18.9

## 2022-05-07 LAB — URINALYSIS, ROUTINE W REFLEX MICROSCOPIC
Bilirubin Urine: NEGATIVE
Glucose, UA: NEGATIVE mg/dL
Ketones, ur: 20 mg/dL — AB
Leukocytes,Ua: NEGATIVE
Nitrite: NEGATIVE
Protein, ur: 100 mg/dL — AB
Specific Gravity, Urine: 1.033 — ABNORMAL HIGH (ref 1.005–1.030)
pH: 5 (ref 5.0–8.0)

## 2022-05-07 LAB — COMPREHENSIVE METABOLIC PANEL
ALT: 10 U/L (ref 0–44)
AST: 19 U/L (ref 15–41)
Albumin: 3.5 g/dL (ref 3.5–5.0)
Alkaline Phosphatase: 64 U/L (ref 38–126)
Anion gap: 10 (ref 5–15)
BUN: 12 mg/dL (ref 6–20)
CO2: 24 mmol/L (ref 22–32)
Calcium: 8.8 mg/dL — ABNORMAL LOW (ref 8.9–10.3)
Chloride: 95 mmol/L — ABNORMAL LOW (ref 98–111)
Creatinine, Ser: 0.49 mg/dL (ref 0.44–1.00)
GFR, Estimated: >60 mL/min (ref >60)
Glucose, Bld: 102 mg/dL — ABNORMAL HIGH (ref 70–99)
Potassium: 2.8 mmol/L — ABNORMAL LOW (ref 3.5–5.1)
Sodium: 129 mmol/L — ABNORMAL LOW (ref 135–145)
Total Bilirubin: 0.8 mg/dL (ref 0.3–1.2)
Total Protein: 7.5 g/dL (ref 6.5–8.1)

## 2022-05-07 LAB — CBC
HCT: 37.6 % (ref 36.0–46.0)
Hemoglobin: 12.9 g/dL (ref 12.0–15.0)
MCH: 29.4 pg (ref 26.0–34.0)
MCHC: 34.3 g/dL (ref 30.0–36.0)
MCV: 85.6 fL (ref 80.0–100.0)
Platelets: 116 10*3/uL — ABNORMAL LOW (ref 150–400)
RBC: 4.39 MIL/uL (ref 3.87–5.11)
RDW: 12.3 % (ref 11.5–15.5)
WBC: 3.4 10*3/uL — ABNORMAL LOW (ref 4.0–10.5)
nRBC: 0 % (ref 0.0–0.2)

## 2022-05-07 LAB — BASIC METABOLIC PANEL
Anion gap: 6 (ref 5–15)
BUN: 7 mg/dL (ref 6–20)
CO2: 23 mmol/L (ref 22–32)
Calcium: 8.1 mg/dL — ABNORMAL LOW (ref 8.9–10.3)
Chloride: 102 mmol/L (ref 98–111)
Creatinine, Ser: 0.47 mg/dL (ref 0.44–1.00)
GFR, Estimated: >60 mL/min (ref >60)
Glucose, Bld: 113 mg/dL — ABNORMAL HIGH (ref 70–99)
Potassium: 3.6 mmol/L (ref 3.5–5.1)
Sodium: 131 mmol/L — ABNORMAL LOW (ref 135–145)

## 2022-05-07 LAB — LACTIC ACID, PLASMA
Lactic Acid, Venous: 1.2 mmol/L (ref 0.5–1.9)
Lactic Acid, Venous: 1.3 mmol/L (ref 0.5–1.9)

## 2022-05-07 LAB — RESP PANEL BY RT-PCR (RSV, FLU A&B, COVID)  RVPGX2
Influenza A by PCR: NEGATIVE
Influenza B by PCR: NEGATIVE
Resp Syncytial Virus by PCR: NEGATIVE
SARS Coronavirus 2 by RT PCR: POSITIVE — AB

## 2022-05-07 LAB — MAGNESIUM: Magnesium: 1.8 mg/dL (ref 1.7–2.4)

## 2022-05-07 LAB — HIV ANTIBODY (ROUTINE TESTING W REFLEX): HIV Screen 4th Generation wRfx: NONREACTIVE

## 2022-05-07 LAB — LIPASE, BLOOD: Lipase: 19 U/L (ref 11–51)

## 2022-05-07 LAB — PROCALCITONIN: Procalcitonin: 0.77 ng/mL

## 2022-05-07 MED ORDER — HYDROCOD POLI-CHLORPHE POLI ER 10-8 MG/5ML PO SUER: 5.0000 mL | Freq: Two times a day (BID) | ORAL | Status: DC | PRN

## 2022-05-07 MED ORDER — POTASSIUM CHLORIDE IN NACL 20-0.9 MEQ/L-% IV SOLN
INTRAVENOUS | Status: DC
  Filled 2022-05-07 (×5): qty 1000

## 2022-05-07 MED ORDER — EPINEPHRINE 0.3 MG/0.3ML IJ SOAJ: 0.3000 mg | INTRAMUSCULAR | Status: DC | PRN

## 2022-05-07 MED ORDER — FOLIC ACID 1 MG PO TABS: 1.0000 mg | ORAL_TABLET | Freq: Every day | ORAL | Status: DC

## 2022-05-07 MED ORDER — VITAMIN C 500 MG PO TABS
500.0000 mg | ORAL_TABLET | Freq: Every day | ORAL | Status: DC
  Administered 2022-05-07 – 2022-05-09 (×3): 500 mg via ORAL
  Filled 2022-05-07 (×3): qty 1

## 2022-05-07 MED ORDER — DIAZEPAM 10 MG RE GEL: 20.0000 mg | Freq: Once | RECTAL | Status: DC

## 2022-05-07 MED ORDER — TRAZODONE HCL 50 MG PO TABS
25.0000 mg | ORAL_TABLET | Freq: Every day | ORAL | Status: DC
  Administered 2022-05-07 – 2022-05-08 (×2): 25 mg via ORAL
  Filled 2022-05-07 (×2): qty 1

## 2022-05-07 MED ORDER — QUETIAPINE FUMARATE 25 MG PO TABS
25.0000 mg | ORAL_TABLET | Freq: Two times a day (BID) | ORAL | Status: DC
  Administered 2022-05-07 – 2022-05-09 (×5): 25 mg via ORAL
  Filled 2022-05-07 (×5): qty 1

## 2022-05-07 MED ORDER — CHLORHEXIDINE GLUCONATE 0.12 % MT SOLN
15.0000 mL | Freq: Two times a day (BID) | OROMUCOSAL | Status: DC
  Administered 2022-05-07 – 2022-05-09 (×5): 15 mL via OROMUCOSAL
  Filled 2022-05-07 (×4): qty 15

## 2022-05-07 MED ORDER — SODIUM CHLORIDE 0.9 % IV SOLN: INTRAVENOUS | Status: DC

## 2022-05-07 MED ORDER — ENOXAPARIN SODIUM 40 MG/0.4ML IJ SOSY
40.0000 mg | PREFILLED_SYRINGE | INTRAMUSCULAR | Status: DC
  Filled 2022-05-07: qty 0.4

## 2022-05-07 MED ORDER — MAGNESIUM HYDROXIDE 400 MG/5ML PO SUSP: 30.0000 mL | Freq: Every day | ORAL | Status: DC | PRN

## 2022-05-07 MED ORDER — NIRMATRELVIR/RITONAVIR (PAXLOVID)TABLET
3.0000 | ORAL_TABLET | Freq: Two times a day (BID) | ORAL | Status: DC
  Administered 2022-05-07 – 2022-05-09 (×5): 3 via ORAL
  Filled 2022-05-07: qty 30

## 2022-05-07 MED ORDER — DIAZEPAM 2 MG PO TABS
2.0000 mg | ORAL_TABLET | Freq: Every day | ORAL | Status: DC
  Administered 2022-05-08 – 2022-05-09 (×2): 2 mg via ORAL
  Filled 2022-05-07 (×2): qty 1

## 2022-05-07 MED ORDER — PAROXETINE HCL 30 MG PO TABS
30.0000 mg | ORAL_TABLET | Freq: Two times a day (BID) | ORAL | Status: DC
  Administered 2022-05-07 – 2022-05-09 (×5): 30 mg via ORAL
  Filled 2022-05-07 (×6): qty 1

## 2022-05-07 MED ORDER — ONDANSETRON HCL 4 MG/2ML IJ SOLN
4.0000 mg | Freq: Four times a day (QID) | INTRAMUSCULAR | Status: DC | PRN
  Administered 2022-05-07: 4 mg via INTRAVENOUS
  Filled 2022-05-07: qty 2

## 2022-05-07 MED ORDER — SODIUM CHLORIDE 0.9 % IV SOLN
2.0000 g | INTRAVENOUS | Status: DC
  Administered 2022-05-08 – 2022-05-09 (×2): 2 g via INTRAVENOUS
  Filled 2022-05-07 (×2): qty 20

## 2022-05-07 MED ORDER — POTASSIUM CHLORIDE 20 MEQ PO PACK
40.0000 meq | PACK | Freq: Once | ORAL | Status: AC
  Administered 2022-05-07: 40 meq via ORAL
  Filled 2022-05-07: qty 2

## 2022-05-07 MED ORDER — LACTATED RINGERS IV BOLUS
1000.0000 mL | Freq: Once | INTRAVENOUS | Status: AC
  Administered 2022-05-07: 1000 mL via INTRAVENOUS

## 2022-05-07 MED ORDER — SODIUM CHLORIDE 0.9 % IV BOLUS
1000.0000 mL | Freq: Once | INTRAVENOUS | Status: AC
  Administered 2022-05-07: 1000 mL via INTRAVENOUS

## 2022-05-07 MED ORDER — POTASSIUM CHLORIDE CRYS ER 20 MEQ PO TBCR
40.0000 meq | EXTENDED_RELEASE_TABLET | ORAL | Status: AC
  Administered 2022-05-07: 40 meq via ORAL
  Filled 2022-05-07: qty 2

## 2022-05-07 MED ORDER — SODIUM CHLORIDE 0.9 % IV SOLN
1.0000 g | Freq: Once | INTRAVENOUS | Status: AC
  Administered 2022-05-07: 1 g via INTRAVENOUS
  Filled 2022-05-07: qty 10

## 2022-05-07 MED ORDER — ZINC SULFATE 220 (50 ZN) MG PO CAPS
220.0000 mg | ORAL_CAPSULE | Freq: Every day | ORAL | Status: DC
  Administered 2022-05-07 – 2022-05-09 (×3): 220 mg via ORAL
  Filled 2022-05-07 (×3): qty 1

## 2022-05-07 MED ORDER — ONDANSETRON HCL 4 MG PO TABS: 4.0000 mg | ORAL_TABLET | Freq: Four times a day (QID) | ORAL | Status: DC | PRN

## 2022-05-07 MED ORDER — IPRATROPIUM BROMIDE HFA 17 MCG/ACT IN AERS
1.0000 | INHALATION_SPRAY | Freq: Four times a day (QID) | RESPIRATORY_TRACT | Status: DC
  Administered 2022-05-08 – 2022-05-09 (×5): 1 via RESPIRATORY_TRACT
  Filled 2022-05-07: qty 12.9

## 2022-05-07 MED ORDER — DIAZEPAM (20 MG DOSE) 2 X 10 MG/0.1ML NA LQPK: 20.0000 mg | NASAL | Status: DC | PRN

## 2022-05-07 MED ORDER — ALBUTEROL SULFATE HFA 108 (90 BASE) MCG/ACT IN AERS
1.0000 | INHALATION_SPRAY | Freq: Four times a day (QID) | RESPIRATORY_TRACT | Status: DC
  Administered 2022-05-08 – 2022-05-09 (×5): 1 via RESPIRATORY_TRACT
  Filled 2022-05-07: qty 6.7

## 2022-05-07 MED ORDER — MELATONIN 5 MG PO TABS: 5.0000 mg | ORAL_TABLET | Freq: Every evening | ORAL | Status: DC | PRN

## 2022-05-07 MED ORDER — METHYLPREDNISOLONE SODIUM SUCC 125 MG IJ SOLR
1.0000 mg/kg | Freq: Two times a day (BID) | INTRAMUSCULAR | Status: DC
  Administered 2022-05-07: 58.75 mg via INTRAVENOUS
  Filled 2022-05-07: qty 2

## 2022-05-07 MED ORDER — SODIUM CHLORIDE 0.9 % IV SOLN: 2.0000 g | INTRAVENOUS | Status: DC

## 2022-05-07 MED ORDER — POTASSIUM CHLORIDE 20 MEQ PO PACK: 40.0000 meq | PACK | Freq: Once | ORAL | Status: DC

## 2022-05-07 MED ORDER — GUAIFENESIN-DM 100-10 MG/5ML PO SYRP: 10.0000 mL | ORAL_SOLUTION | ORAL | Status: DC | PRN

## 2022-05-07 MED ORDER — TRAZODONE HCL 50 MG PO TABS
25.0000 mg | ORAL_TABLET | Freq: Every evening | ORAL | Status: DC | PRN
  Administered 2022-05-08: 25 mg via ORAL
  Filled 2022-05-07: qty 1

## 2022-05-07 MED ORDER — POTASSIUM CHLORIDE 10 MEQ/100ML IV SOLN
10.0000 meq | Freq: Once | INTRAVENOUS | Status: AC
  Administered 2022-05-07: 10 meq via INTRAVENOUS
  Filled 2022-05-07: qty 100

## 2022-05-07 MED ORDER — LAMOTRIGINE 100 MG PO TABS
100.0000 mg | ORAL_TABLET | Freq: Two times a day (BID) | ORAL | Status: DC
  Administered 2022-05-07 – 2022-05-09 (×5): 100 mg via ORAL
  Filled 2022-05-07 (×5): qty 1

## 2022-05-07 MED ORDER — ACETAMINOPHEN 325 MG PO TABS: 650.0000 mg | ORAL_TABLET | Freq: Four times a day (QID) | ORAL | Status: DC | PRN

## 2022-05-07 MED ORDER — GUAIFENESIN ER 600 MG PO TB12
600.0000 mg | ORAL_TABLET | Freq: Two times a day (BID) | ORAL | Status: DC
  Administered 2022-05-07 – 2022-05-09 (×5): 600 mg via ORAL
  Filled 2022-05-07 (×5): qty 1

## 2022-05-07 MED ORDER — IPRATROPIUM-ALBUTEROL 0.5-2.5 (3) MG/3ML IN SOLN
3.0000 mL | Freq: Four times a day (QID) | RESPIRATORY_TRACT | Status: DC
  Administered 2022-05-07 (×3): 3 mL via RESPIRATORY_TRACT
  Filled 2022-05-07 (×3): qty 3

## 2022-05-07 MED ORDER — DIAZEPAM 2 MG PO TABS: 1.0000 mg | ORAL_TABLET | ORAL | Status: DC

## 2022-05-07 MED ORDER — FAMOTIDINE 20 MG PO TABS
20.0000 mg | ORAL_TABLET | Freq: Two times a day (BID) | ORAL | Status: DC
  Administered 2022-05-07 – 2022-05-09 (×5): 20 mg via ORAL
  Filled 2022-05-07 (×6): qty 1

## 2022-05-07 MED ORDER — FLUCONAZOLE 50 MG PO TABS: 150.0000 mg | ORAL_TABLET | Freq: Every day | ORAL | Status: DC

## 2022-05-07 MED ORDER — SODIUM CHLORIDE 0.9 % IV SOLN
500.0000 mg | Freq: Once | INTRAVENOUS | Status: AC
  Administered 2022-05-07: 500 mg via INTRAVENOUS
  Filled 2022-05-07: qty 5

## 2022-05-07 MED ORDER — ALPRAZOLAM 0.25 MG PO TABS
0.2500 mg | ORAL_TABLET | Freq: Two times a day (BID) | ORAL | Status: DC | PRN
  Administered 2022-05-07 – 2022-05-08 (×2): 0.25 mg via ORAL
  Filled 2022-05-07 (×2): qty 1

## 2022-05-07 MED ORDER — SODIUM CHLORIDE 0.9 % IV SOLN: 1.0000 g | INTRAVENOUS | Status: DC

## 2022-05-07 MED ORDER — PREDNISONE 20 MG PO TABS: 50.0000 mg | ORAL_TABLET | Freq: Every day | ORAL | Status: DC

## 2022-05-07 MED ORDER — LEVETIRACETAM 750 MG PO TABS
750.0000 mg | ORAL_TABLET | Freq: Three times a day (TID) | ORAL | Status: DC
  Administered 2022-05-07 – 2022-05-09 (×6): 750 mg via ORAL
  Filled 2022-05-07 (×8): qty 1

## 2022-05-07 MED ORDER — ACETAMINOPHEN 650 MG RE SUPP: 650.0000 mg | Freq: Four times a day (QID) | RECTAL | Status: DC | PRN

## 2022-05-07 MED ORDER — SODIUM CHLORIDE 0.9 % IV SOLN
500.0000 mg | INTRAVENOUS | Status: DC
  Administered 2022-05-08 – 2022-05-09 (×2): 500 mg via INTRAVENOUS
  Filled 2022-05-07 (×2): qty 5

## 2022-05-07 MED ORDER — DIAZEPAM 2 MG PO TABS
4.0000 mg | ORAL_TABLET | Freq: Every day | ORAL | Status: DC
  Administered 2022-05-08: 4 mg via ORAL
  Filled 2022-05-07: qty 2

## 2022-05-07 NOTE — H&P (Signed)
[place]   PATIENT NAME: Lindsey [name]    MR#:  [id number]  DATE OF BIRTH:  05-28-1985  DATE OF ADMISSION:  05/07/2022  PRIMARY CARE PHYSICIAN: Sherlene Shams, MD   Patient is coming from: Home  REQUESTING/REFERRING PHYSICIAN: Delton Prairie, MD  CHIEF COMPLAINT:   Chief Complaint  Patient presents with   Emesis   Fever   Cough    HISTORY OF PRESENT ILLNESS:  Lindsey [name] is a 37 y.o. Caucasian female with medical history significant for seizure disorder, cerebral palsy and history of partial right temporal lobe resection, recurrent UTIs, s/p vagus nerve stimulator with history of depression, presented to the emergency room with acute onset of worsening cough with associated fever and vomiting as well as anorexia for the last 3 days. No chest pain no palpitations.  No significant wheezing or dyspnea.  No diarrhea or melena or bright red being per rectum..  No reported dysuria, oliguria or hematuria or flank pain.  ED Course: When she came to the ER, vital signs were within normal.  Labs revealed hyponatremia, hypochloremia and hypokalemia.  COVID-19 PCR came back positive and UA showed 100 protein 20 ketones rare bacteria with only 0-5 WBCs with 21-50 RBCs.  Blood cultures were drawn. EKG as reviewed by me : none Imaging: Portable chest x-ray showed moderate severity left basilar infiltrate.  The patient was given IV Rocephin, IV Zithromax as well as 1 L bolus of IV lactated Ringer's and 1 L of IV normal saline.  She will be admitted to a medical telemetry bed for further evaluation and management. PAST MEDICAL HISTORY:   Past Medical History:  Diagnosis Date   COVID-19 12/2020   Epilepsy [facility]) age 3   negative metabolic workup has brain abnormalities by MRI (Dr. 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 No pertinent family history.  DRUG ALLERGIES:   Allergies  Allergen Reactions   Other Swelling     Allergic Reaction to all nuts/ingredients   Influenza Vac Split Quad Other (See Comments)    48 hours of fever, myalgias    Benadryl [Diphenhydramine Hcl]     Seizure   Montelukast Swelling   Ropinirole Swelling    Itching,    Zyrtec [Cetirizine Hcl]     Seizure    Peanut-Containing Drug Products Rash    REVIEW OF SYSTEMS:   ROS As per history of present illness. All pertinent systems were reviewed above. Constitutional, HEENT, cardiovascular, respiratory, GI, GU, musculoskeletal, neuro, psychiatric, endocrine, integumentary and hematologic systems were reviewed and  are otherwise negative/unremarkable except for positive findings mentioned above in the HPI.   MEDICATIONS AT HOME:   Prior to Admission medications   Medication Sig Start Date End Date Taking? Authorizing Provider  ALPRAZolam Duanne Moron) 0.5 MG tablet Take 0.5 tablets (0.25 mg total) by mouth 2 (two) times daily as needed for anxiety. 04/20/22   Crecencio Mc, MD  cephALEXin (KEFLEX) 500 MG capsule Take 1 capsule (500 mg total) by mouth 2 (two) times daily. 04/13/22   Harvest Dark, MD  chlorhexidine (PERIDEX) 0.12 % solution 15 mLs 2 (two) times daily. 02/10/20   [provider]  diazepam (DIASTAT ACUDIAL) 10 MG GEL Place 20 mg rectally once.    [provider]  diazepam (VALIUM) 2 MG tablet 1 tablet in the am,  2 tablets in the PM 03/19/22   Crecencio Mc, MD  EPINEPHrine 0.3 mg/0.3 mL IJ SOAJ injection Inject 0.3 mg into the muscle as needed for anaphylaxis. 08/14/21   Crecencio Mc, MD  fluconazole (DIFLUCAN) 150 MG tablet Take 1 tablet (150 mg total) by mouth daily. 04/22/22   Crecencio Mc, MD  folic acid (FOLVITE) 1 MG tablet Take 1 tablet (1 mg total) by mouth daily. 04/05/22 07/04/22  Jane Canary, MD  LAMICTAL 100 MG tablet Take 100 mg by mouth 2 (two) times daily.  08/03/16   [provider]  levETIRAcetam (KEPPRA) 750 MG tablet Take 3 tablets (2,250 mg total) by mouth daily. TAKE 1 and a half pills twice a day 04/12/20   Clapacs, John T, MD  melatonin 5 MG TABS Take 5 mg by mouth.    [provider]  PARoxetine (PAXIL) 30 MG tablet Take 1 tablet (30 mg total) by mouth 2 (two) times daily. 04/21/22   Crecencio Mc, MD  QUEtiapine (SEROQUEL) 25 MG tablet Take 1 tablet (25 mg total) by mouth 2 (two) times daily. 10/20/21   Crecencio Mc, MD  traZODone (DESYREL) 50 MG tablet Take 0.5 tablets (25 mg total) by mouth at bedtime. 10/20/21   Crecencio Mc, MD  VALTOCO 20 MG DOSE 10 MG/0.1ML LQPK Place into both nostrils. 12/12/20   [provider]      VITAL SIGNS:  Blood pressure 106/75, pulse 86, temperature 98.3 F (36.8 C), temperature source Axillary, resp. rate 20, height 5\' 6"  (1.676 m), weight 59 kg, last menstrual period 01/09/2011, SpO2 94 %.  PHYSICAL EXAMINATION:  Physical Exam  GENERAL:  37 y.o.-year-old Caucasian female patient lying in the bed with mild conversational dyspnea. EYES: Pupils equal, round, reactive to light and accommodation. No scleral icterus. Extraocular muscles intact.  HEENT: Head atraumatic, normocephalic. Oropharynx and nasopharynx clear.  NECK:  Supple, no jugular  venous distention. No thyroid enlargement, no tenderness.  LUNGS: Diminished with breath sounds with associated with crackles.  No use of accessory muscles of respiration.  CARDIOVASCULAR: Regular rate and rhythm, S1, S2 normal. No murmurs, rubs, or gallops.  ABDOMEN: Soft, nondistended, nontender. Bowel sounds present. No organomegaly or mass.  EXTREMITIES: No pedal edema, cyanosis, or clubbing.  NEUROLOGIC: Cranial nerves II through XII are intact. Muscle strength 5/5 in all extremities. Sensation intact. Gait not checked.  PSYCHIATRIC: The patient is alert and oriented x 3.  Normal affect and good eye contact. SKIN: No obvious rash, lesion, or ulcer.   LABORATORY PANEL:   CBC Recent Labs  Lab 05/07/22 0022  WBC 3.4*  HGB 12.9  HCT 37.6  PLT 116*   ------------------------------------------------------------------------------------------------------------------  Chemistries  Recent Labs  Lab 05/07/22 0022  NA 129*  K 2.8*  CL 95*  CO2 24  GLUCOSE 102*  BUN 12  CREATININE 0.49  CALCIUM 8.8*  AST 19  ALT 10  ALKPHOS 64  BILITOT 0.8   ------------------------------------------------------------------------------------------------------------------  Cardiac Enzymes No results for input(s): "TROPONINI" in the last 168 hours. ------------------------------------------------------------------------------------------------------------------  RADIOLOGY:  DG Chest Portable 1 View  Result Date: 05/07/2022 CLINICAL DATA:  Vomiting with fever and cough. EXAM: PORTABLE CHEST 1 VIEW COMPARISON:  December 21, 2020 FINDINGS: The heart size and mediastinal contours are within normal limits. Moderate severity left basilar infiltrate is seen. There is no evidence of a pleural effusion or pneumothorax. A left-sided neural stimulator is present and is unchanged in position. The visualized skeletal structures are unremarkable. IMPRESSION: Moderate severity left basilar infiltrate.  Electronically Signed   By: Virgina Norfolk M.D.   On: 05/07/2022 01:00      IMPRESSION AND PLAN:  Assessment and Plan: * Sepsis due to pneumonia [facility]) - The patient be admitted to the medical telemetry bed. - This like secondary to left basal community-acquired pneumonia.  It could be viral due to COVID-19 with secondary bacterial infection especially given elevated procalcitonin. - We will continue antibiotic therapy with IV Rocephin and Zithromax. - Mucolytic therapy and bronchodilator therapy will be provided. - We will continue hydration with IV normal saline with added potassium chloride.  COVID-19 virus infection - We will place on p.o. Paxlovid, vitamin C and zinc sulfate.   Acute respiratory failure with hypoxia (HCC) - This likely due to #1 and #2. - O2 protocol will be followed. - Management otherwise as above  Hypokalemia - Potassium will be replaced and magnesium level will be checked.  Hyponatremia - This is likely hypovolemic. - She will be hydrated with IV normal saline and will follow BMP.  Seizure disorder ([place]) - We will continue her Keppra and Lamictal.  Anxiety and depression - Working continue her Xanax and Valium as well as Paxil and Seroquel.   DVT prophylaxis: Lovenox.  Advanced Care Planning:  Code Status: full code.  Family Communication:  The plan of care was discussed in details with the patient (and family). I answered all questions. The patient agreed to proceed with the above mentioned plan. Further management will depend upon hospital course. Disposition Plan: Back to previous home environment Consults called: none.  All the records are reviewed and case discussed with ED provider.  Status is: Inpatient   At the time of the admission, it appears that the appropriate admission status for this patient is inpatient.  This is judged to be reasonable and necessary in order to provide the required intensity of service to ensure the patient's  safety given the presenting symptoms, physical exam findings and initial radiographic and laboratory data in the context of comorbid conditions.  The patient requires inpatient status due to high intensity of service, high risk of further deterioration and high frequency of surveillance required.  I certify that at the time of admission, it is my clinical judgment that the patient will require inpatient hospital care extending more than 2 midnights.                            Dispo: The patient is from: Home              Anticipated d/c is to: Home  Patient currently is not medically stable to d/c.              Difficult to place patient: No  Hannah Beat M.D on 05/07/2022 at 6:32 AM  [facility]   From 7 PM-7 AM, contact night-coverage [email or web address]  CC: Primary care physician; Sherlene Shams, MD

## 2022-05-07 NOTE — ED Triage Notes (Signed)
Pt with mother in triage who is caregiver. Mother reports pt has had emesis, fever, cough and nasal congestion x3 days. Mother concerned due to inability to keep PO fluids down. Last med given was Motrin on 12/31 AM.

## 2022-05-07 NOTE — Assessment & Plan Note (Signed)
-   We will continue her Keppra and Lamictal. 

## 2022-05-07 NOTE — Assessment & Plan Note (Signed)
-   This is likely hypovolemic. - She will be hydrated with IV normal saline and will follow BMP. 

## 2022-05-07 NOTE — Assessment & Plan Note (Signed)
-   Working continue her Xanax and Valium as well as Paxil and Seroquel.

## 2022-05-07 NOTE — Hospital Course (Signed)
Lindsey [name] is a 37 y.o. Caucasian female with medical history significant for seizure disorder, cerebral palsy and history of partial right temporal lobe resection, recurrent UTIs, s/p vagus nerve stimulator with history of depression, presented to the emergency room with acute onset of worsening cough with associated fever and vomiting as well as anorexia for the last 3 days.  Patient was found to be COVID-positive, but no hypoxemia.  Chest x-ray showed left lower lobe infiltrates, mild elevation of procalcitonin level consistent with bacterial pneumonia.  Patient is treated with Rocephin and azithromycin

## 2022-05-07 NOTE — Progress Notes (Signed)
CODE SEPSIS - PHARMACY COMMUNICATION  **Broad Spectrum Antibiotics should be administered within 1 hour of Sepsis diagnosis**  Time Code Sepsis Called/Page Received: 0445  Antibiotics Ordered: Ceftriaxone & Azithromycin  Time of 1st antibiotic administration: [place], PharmD, [facility] 05/07/2022 4:48 AM

## 2022-05-07 NOTE — ED Provider Notes (Signed)
[facility] Provider Note    Event Date/Time   First MD Initiated Contact with Patient 05/07/22 [phone]     (approximate)   History   Emesis, Fever, and Cough   HPI  Lindsey [name] is a 37 y.o. female who presents to the ED for evaluation of Emesis, Fever, and Cough   Mom and patient to the ED for evaluation of fever, cough, emesis and poor oral intake.  I reviewed PCP visit from 12/15.  CP patient with a history of recurrent UTIs, seizure disorder, vagal nerve stimulator  Patient has been sick for about 3 days.  Keeping very little down p.o., coughing and with fevers.  Physical Exam   Triage Vital Signs: ED Triage Vitals  Enc Vitals Group     BP 05/07/22 0009 115/87     Pulse Rate 05/07/22 0009 (!) 115     Resp 05/07/22 0009 20     Temp 05/07/22 0009 99.8 F (37.7 C)     Temp Source 05/07/22 0009 Oral     SpO2 05/07/22 0009 97 %     Weight 05/07/22 0010 130 lb 1.1 oz (59 kg)     Height 05/07/22 0010 5\' 6"  (1.676 m)     Head Circumference --      Peak Flow --      Pain Score 05/07/22 0310 0     Pain Loc --      Pain Edu? --      Excl. in [place]? --     Most recent vital signs: Vitals:   05/07/22 0009 05/07/22 0354  BP: 115/87 106/75  Pulse: (!) 115 86  Resp: 20 20  Temp: 99.8 F (37.7 C) 98.3 F (36.8 C)  SpO2: 97% 94%    General: Awake, no distress.  CV:  Good peripheral perfusion.  Tachycardic and regular Resp:  Mild tachypnea to the low 20s. Abd:  No distention.  Soft and benign throughout MSK:  No deformity noted.  Neuro:  No focal deficits appreciated. Other:     ED Results / Procedures / Treatments   Labs (all labs ordered are listed, but only abnormal results are displayed) Labs Reviewed  RESP PANEL BY RT-PCR (RSV, FLU A&B, COVID)  [id number] - Abnormal; Notable for the following components:      Result Value   SARS Coronavirus 2 by RT PCR POSITIVE (*)    All other components within normal limits  COMPREHENSIVE  METABOLIC PANEL - Abnormal; Notable for the following components:   Sodium 129 (*)    Potassium 2.8 (*)    Chloride 95 (*)    Glucose, Bld 102 (*)    Calcium 8.8 (*)    All other components within normal limits  CBC - Abnormal; Notable for the following components:   WBC 3.4 (*)    Platelets 116 (*)    All other components within normal limits  URINALYSIS, ROUTINE W REFLEX MICROSCOPIC - Abnormal; Notable for the following components:   Color, Urine AMBER (*)    APPearance HAZY (*)    Specific Gravity, Urine 1.033 (*)    Hgb urine dipstick MODERATE (*)    Ketones, ur 20 (*)    Protein, ur 100 (*)    Bacteria, UA RARE (*)    All other components within normal limits  CULTURE, BLOOD (ROUTINE X 2)  CULTURE, BLOOD (ROUTINE X 2)  LIPASE, BLOOD  LACTIC ACID, PLASMA  LACTIC ACID, PLASMA  PROCALCITONIN    EKG  RADIOLOGY 1 view CXR interpreted by me with left basilar infiltrate  Official radiology report(s): DG Chest Portable 1 View  Result Date: 05/07/2022 CLINICAL DATA:  Vomiting with fever and cough. EXAM: PORTABLE CHEST 1 VIEW COMPARISON:  December 21, 2020 FINDINGS: The heart size and mediastinal contours are within normal limits. Moderate severity left basilar infiltrate is seen. There is no evidence of a pleural effusion or pneumothorax. A left-sided neural stimulator is present and is unchanged in position. The visualized skeletal structures are unremarkable. IMPRESSION: Moderate severity left basilar infiltrate. Electronically Signed   By: Virgina Norfolk M.D.   On: 05/07/2022 01:00    PROCEDURES and INTERVENTIONS:  .1-3 Lead EKG Interpretation  Performed by: Vladimir Crofts, MD Authorized by: Vladimir Crofts, MD     Interpretation: normal     ECG rate:  90   ECG rate assessment: normal     Rhythm: sinus rhythm     Ectopy: none     Conduction: normal   .Critical Care  Performed by: Vladimir Crofts, MD Authorized by: Vladimir Crofts, MD   Critical care provider statement:     Critical care time (minutes):  30   Critical care time was exclusive of:  Separately billable procedures and treating other patients   Critical care was necessary to treat or prevent imminent or life-threatening deterioration of the following conditions:  Sepsis   Critical care was time spent personally by me on the following activities:  Development of treatment plan with patient or surrogate, discussions with consultants, evaluation of patient's response to treatment, examination of patient, ordering and review of laboratory studies, ordering and review of radiographic studies, ordering and performing treatments and interventions, pulse oximetry, re-evaluation of patient's condition and review of old charts   Medications  azithromycin (ZITHROMAX) 500 mg in sodium chloride 0.9 % 250 mL IVPB (500 mg Intravenous New Bag/Given 05/07/22 0417)  0.9 %  sodium chloride infusion (has no administration in time range)  lactated ringers bolus 1,000 mL (0 mLs Intravenous Stopped 05/07/22 0234)  sodium chloride 0.9 % bolus 1,000 mL (1,000 mLs Intravenous New Bag/Given 05/07/22 0349)  cefTRIAXone (ROCEPHIN) 1 g in sodium chloride 0.9 % 100 mL IVPB (0 g Intravenous Stopped 05/07/22 0416)     IMPRESSION / MDM / [place] / ED COURSE  I reviewed the triage vital signs and the nursing notes.  Differential diagnosis includes, but is not limited to, pneumonia, pneumothorax, viral syndrome, appendicitis, cholelithiasis  {Patient presents with symptoms of an acute illness or injury that is potentially life-threatening.   CP patient presents with fever, emesis and cough, with evidence of sepsis from pneumonia requiring medical admission.  Noted to be tachycardic on arrival, vitals otherwise stable on room air.  No signs of shock.  Has some mild tachypnea.  Left basilar infiltrate is present.  Urine with ketones suggestive of dehydration.  Some mild hypokalemia and hyponatremia is noted.  Procalcitonin is slightly  elevated.  I suspect a superimposed pneumonia over a viral syndrome from COVID-19.  CAP coverage is provided and I will consult medicine for admission.     FINAL CLINICAL IMPRESSION(S) / ED DIAGNOSES   Final diagnoses:  [id number]  Community acquired pneumonia of left lower lobe of lung  Sepsis without acute organ dysfunction, due to unspecified organism ([place])  Hyponatremia     Rx / DC Orders   ED Discharge Orders     None        Note:  This document was prepared using  [employer] and may include unintentional dictation errors.   Delton Prairie, MD 05/07/22 [phone]

## 2022-05-07 NOTE — Sepsis Progress Note (Signed)
Elink following for sepsis protocol. 

## 2022-05-07 NOTE — Assessment & Plan Note (Signed)
-   We will place on p.o. Paxlovid, vitamin C and zinc sulfate.

## 2022-05-07 NOTE — Assessment & Plan Note (Signed)
-   This likely due to #1 and #2. - O2 protocol will be followed. - Management otherwise as above

## 2022-05-07 NOTE — Assessment & Plan Note (Addendum)
-   The patient be admitted to the medical telemetry bed. - This like secondary to left basal community-acquired pneumonia.  It could be viral due to COVID-19 with secondary bacterial infection especially given elevated procalcitonin. - We will continue antibiotic therapy with IV Rocephin and Zithromax. - Mucolytic therapy and bronchodilator therapy will be provided. - We will continue hydration with IV normal saline with added potassium chloride.

## 2022-05-07 NOTE — Assessment & Plan Note (Signed)
-   Potassium will be replaced and magnesium level will be checked. ?

## 2022-05-07 NOTE — Progress Notes (Signed)
  Progress Note   Patient: Lindsey [name] WRU:[id number] DOB: 06/07/1985 DOA: 05/07/2022     0 DOS: the patient was seen and examined on 05/07/2022   Brief hospital course: Lindsey [name] is a 37 y.o. Caucasian female with medical history significant for seizure disorder, cerebral palsy and history of partial right temporal lobe resection, recurrent UTIs, s/p vagus nerve stimulator with history of depression, presented to the emergency room with acute onset of worsening cough with associated fever and vomiting as well as anorexia for the last 3 days.  Patient was found to be COVID-positive, but no hypoxemia.  Chest x-ray showed left lower lobe infiltrates, mild elevation of procalcitonin level consistent with bacterial pneumonia.  Patient is treated with Rocephin and azithromycin   Assessment and Plan: Left lower lobe bacterial pneumonia. COVID-19 infection. Acute respiratory failure ruled out. Sepsis ruled out. Reviewed record, patient never developed significant hypoxemia in the hospital.  Not consistent with acute respiratory failure. Patient vital signs and the lab work did not show evidence of sepsis. Patient is positive for COVID, chest x-ray showed significant left lower lobe infiltrates with mild elevation procalcitonin level.  patient appears to have a bacterial pneumonia in addition to COVID infection.  Hypokalemia Hyponatremia This most likely secondary to dehydration, as patient has GI symptoms.  Receiving IV fluids and potassium replacement.  Recheck levels again later today.  Seizure disorder (HCC) Moderate intelligence debility Cerebral palsy. Anxiety and depression Continued home medicines.  Thrombocytopenia. Secondary to COVID.  Continue to monitor.       Subjective:  Patient feels well, no agitation.  No shortness of breath, no hypoxia.  Physical Exam: Vitals:   05/07/22 0600 05/07/22 0630 05/07/22 0700 05/07/22 0834  BP: 97/66 97/75 101/70 102/80  Pulse:  82 94 75 86  Resp:    20  Temp:    98 F (36.7 C)  TempSrc:      SpO2: 93% 95% 93% 90%  Weight:      Height:       General exam: Appears calm and comfortable  Respiratory system: Clear to auscultation. Respiratory effort normal. Cardiovascular system: S1 & S2 heard, RRR. No JVD, murmurs, rubs, gallops or clicks. No pedal edema. Gastrointestinal system: Abdomen is nondistended, soft and nontender. No organomegaly or masses felt. Normal bowel sounds heard. Central nervous system: Alert and oriented x2. No focal neurological deficits. Extremities: Symmetric 5 x 5 power. Skin: No rashes, lesions or ulcers Psychiatry: Judgement and insight appear normal. Mood & affect appropriate.   Data Reviewed:  Chest x-ray and lab results reviewed.  Family Communication: Mother updated at bedside  Disposition: Status is: Inpatient Remains inpatient appropriate because: Severity of disease, IV treatment.  Planned Discharge Destination: Home    Time spent: 35 minutes  Author: Sharen Hones, MD 05/07/2022 11:10 AM  For on call review [email or web address].

## 2022-05-08 DIAGNOSIS — D708 Other neutropenia: Secondary | ICD-10-CM

## 2022-05-08 DIAGNOSIS — E872 Acidosis, unspecified: Secondary | ICD-10-CM | POA: Insufficient documentation

## 2022-05-08 LAB — CBC
HCT: 31.8 % — ABNORMAL LOW (ref 36.0–46.0)
Hemoglobin: 10.5 g/dL — ABNORMAL LOW (ref 12.0–15.0)
MCH: 29.5 pg (ref 26.0–34.0)
MCHC: 33 g/dL (ref 30.0–36.0)
MCV: 89.3 fL (ref 80.0–100.0)
Platelets: 99 10*3/uL — ABNORMAL LOW (ref 150–400)
RBC: 3.56 MIL/uL — ABNORMAL LOW (ref 3.87–5.11)
RDW: 12.1 % (ref 11.5–15.5)
WBC: 1.2 10*3/uL — CL (ref 4.0–10.5)
nRBC: 0 % (ref 0.0–0.2)

## 2022-05-08 LAB — CORTISOL-AM, BLOOD: Cortisol - AM: 2.9 ug/dL — ABNORMAL LOW (ref 6.7–22.6)

## 2022-05-08 LAB — MAGNESIUM: Magnesium: 2.2 mg/dL (ref 1.7–2.4)

## 2022-05-08 LAB — BASIC METABOLIC PANEL
Anion gap: 7 (ref 5–15)
BUN: 8 mg/dL (ref 6–20)
CO2: 21 mmol/L — ABNORMAL LOW (ref 22–32)
Calcium: 8.7 mg/dL — ABNORMAL LOW (ref 8.9–10.3)
Chloride: 108 mmol/L (ref 98–111)
Creatinine, Ser: 0.41 mg/dL — ABNORMAL LOW (ref 0.44–1.00)
GFR, Estimated: >60 mL/min (ref >60)
Glucose, Bld: 93 mg/dL (ref 70–99)
Potassium: 4.6 mmol/L (ref 3.5–5.1)
Sodium: 136 mmol/L (ref 135–145)

## 2022-05-08 LAB — PROCALCITONIN: Procalcitonin: 0.43 ng/mL

## 2022-05-08 LAB — PROTIME-INR
INR: 1.2 (ref 0.8–1.2)
Prothrombin Time: 15.3 seconds — ABNORMAL HIGH (ref 11.4–15.2)

## 2022-05-08 MED ORDER — ORAL CARE MOUTH RINSE: 15.0000 mL | OROMUCOSAL | Status: DC | PRN

## 2022-05-08 MED ORDER — AEROCHAMBER PLUS FLO-VU LARGE MISC
1.0000 | Freq: Once | Status: AC
  Administered 2022-05-08: 1
  Filled 2022-05-08: qty 1

## 2022-05-08 MED ORDER — SACCHAROMYCES BOULARDII 250 MG PO CAPS
250.0000 mg | ORAL_CAPSULE | Freq: Two times a day (BID) | ORAL | Status: DC
  Administered 2022-05-08 – 2022-05-09 (×3): 250 mg via ORAL
  Filled 2022-05-08 (×3): qty 1

## 2022-05-08 MED ORDER — LACTATED RINGERS IV SOLN: INTRAVENOUS | Status: DC

## 2022-05-08 NOTE — Progress Notes (Signed)
  Progress Note   Patient: Lindsey [name] ZOX:[id number] DOB: [birth year] DOA: 05/07/2022     1 DOS: the patient was seen and examined on 05/08/2022   Brief hospital course: Lindsey [name] is a 37 y.o. Caucasian female with medical history significant for seizure disorder, cerebral palsy and history of partial right temporal lobe resection, recurrent UTIs, s/p vagus nerve stimulator with history of depression, presented to the emergency room with acute onset of worsening cough with associated fever and vomiting as well as anorexia for the last 3 days.  Patient was found to be COVID-positive, but no hypoxemia.  Chest x-ray showed left lower lobe infiltrates, mild elevation of procalcitonin level consistent with bacterial pneumonia.  Patient is treated with Rocephin and azithromycin   Assessment and Plan:  Left lower lobe bacterial pneumonia. COVID-19 infection. Acute respiratory failure ruled out. Sepsis ruled out. Reviewed record, patient never developed significant hypoxemia in the hospital.  Not consistent with acute respiratory failure. Patient vital signs and the lab work did not show evidence of sepsis. Patient is positive for COVID, chest x-ray showed significant left lower lobe infiltrates with mild elevation procalcitonin level.  patient appears to have a bacterial pneumonia in addition to COVID infection. Patient respiratory symptoms seem to be improving, continue antibiotics. Blood cultures so far had no growth.  Metabolic acidosis. Hypokalemia Hyponatremia This most likely secondary to dehydration, as patient has GI symptoms.  Potassium and sodium are normalized with fluids.  Patient developed mild metabolic acidosis, fluid changed to LR for 1 day.  Patient has not been eating well, will keep patient for another day, most likely discharge home tomorrow  Leukopenia secondary to Lindsey [name]. Thrombocytopenia secondary to COVID. White cell count dropped down to 1.2, also significant  thrombocytopenia.  This appears to be secondary to COVID.  Recheck levels tomorrow.  Seizure disorder (HCC) Moderate intelligence debility Cerebral palsy. Anxiety and depression Continued home medicines.       Subjective:  Patient doing better today, still has poor appetite, no nausea vomiting or abdominal pain.  No diarrhea.  Denies any short of breath, no hypoxia.  Physical Exam: Vitals:   05/07/22 1823 05/07/22 2208 05/08/22 0623 05/08/22 0858  BP: 115/88 98/70 (!) 117/99 124/84  Pulse: 90 60 81 (!) 52  Resp: 20 20 18 18   Temp: 98.2 F (36.8 C) (!) 97.4 F (36.3 C) (!) 97.5 F (36.4 C) 97.9 F (36.6 C)  TempSrc: Axillary Oral Oral   SpO2: 94% 97% 98% 97%  Weight:      Height:       General exam: Appears calm and comfortable  Respiratory system: Clear to auscultation. Respiratory effort normal. Cardiovascular system: S1 & S2 heard, RRR. No JVD, murmurs, rubs, gallops or clicks. No pedal edema. Gastrointestinal system: Abdomen is nondistended, soft and nontender. No organomegaly or masses felt. Normal bowel sounds heard. Central nervous system: Alert and oriented x2, No focal neurological deficits. Extremities: Symmetric 5 x 5 power. Skin: No rashes, lesions or ulcers Psychiatry: Mood & affect appropriate.   Data Reviewed:  Lab results reviewed.  Family Communication: Mother updated at bedside.  Disposition: Status is: Inpatient Remains inpatient appropriate because: Severity of disease, IV treatment  Planned Discharge Destination: Home with Home Health    Time spent: 35 minutes  Author: Sharen Hones, MD 05/08/2022 10:28 AM  For on call review [email or web address].

## 2022-05-09 DIAGNOSIS — R7989 Other specified abnormal findings of blood chemistry: Secondary | ICD-10-CM | POA: Insufficient documentation

## 2022-05-09 DIAGNOSIS — U071 COVID-19: Secondary | ICD-10-CM | POA: Diagnosis not present

## 2022-05-09 LAB — BASIC METABOLIC PANEL
Anion gap: 6 (ref 5–15)
BUN: 7 mg/dL (ref 6–20)
CO2: 23 mmol/L (ref 22–32)
Calcium: 8.2 mg/dL — ABNORMAL LOW (ref 8.9–10.3)
Chloride: 103 mmol/L (ref 98–111)
Creatinine, Ser: 0.45 mg/dL (ref 0.44–1.00)
GFR, Estimated: >60 mL/min (ref >60)
Glucose, Bld: 85 mg/dL (ref 70–99)
Potassium: 3.9 mmol/L (ref 3.5–5.1)
Sodium: 132 mmol/L — ABNORMAL LOW (ref 135–145)

## 2022-05-09 LAB — CBC
HCT: 31.7 % — ABNORMAL LOW (ref 36.0–46.0)
Hemoglobin: 10.4 g/dL — ABNORMAL LOW (ref 12.0–15.0)
MCH: 29.2 pg (ref 26.0–34.0)
MCHC: 32.8 g/dL (ref 30.0–36.0)
MCV: 89 fL (ref 80.0–100.0)
Platelets: 111 10*3/uL — ABNORMAL LOW (ref 150–400)
RBC: 3.56 MIL/uL — ABNORMAL LOW (ref 3.87–5.11)
RDW: 12.4 % (ref 11.5–15.5)
WBC: 1.6 10*3/uL — ABNORMAL LOW (ref 4.0–10.5)
nRBC: 0 % (ref 0.0–0.2)

## 2022-05-09 LAB — PHOSPHORUS: Phosphorus: 2.4 mg/dL — ABNORMAL LOW (ref 2.5–4.6)

## 2022-05-09 LAB — MAGNESIUM: Magnesium: 2 mg/dL (ref 1.7–2.4)

## 2022-05-09 MED ORDER — AMOXICILLIN-POT CLAVULANATE 875-125 MG PO TABS: 1.0000 | ORAL_TABLET | Freq: Two times a day (BID) | ORAL | 0 refills | Status: DC

## 2022-05-09 NOTE — Progress Notes (Signed)
  Transition of Care [facility]) Screening Note   Patient Details  Name: Lindsey [name] Date of Birth: 03-06-1986   Transition of Care [facility]) CM/SW Contact:    Quin Hoop, LCSW Phone Number: 05/09/2022, 10:03 AM    Transition of Care Department [facility]) has reviewed patient and no TOC needs have been identified at this time. We will continue to monitor patient advancement through interdisciplinary progression rounds. If new patient transition needs arise, please place a TOC consult.

## 2022-05-09 NOTE — Progress Notes (Incomplete)
Lindsey [name] is a 37 YO F with a PMH of seizures, cerebral palsy, and history of partial right temporal lobe resection, recurrent UTIs, depression. Lindsey [name] presented to the ED with a worsening cough and fever with vomiting. CC: Lindsey [name] is positive for COVID-19. Labs: Na: 132, K: 3.9. Allergies: montelukast, ropinirole, zyrtec, influenza vac. PTA: xanax, diazepam, keppra, paroxetine, quetiapine, trazodone.    AC/Heme:  DVT ppx: LMWH ID:  COVID-19 infection --Paxlovid x 5 days --Ceftriaxone + azithromycin x 5 days --Solu-medrol 1 mg/kg BID x 3 days followed by prednisone 50 mg daily >> discontinued since not hypoxic and on room air Neuro/Pain: Cerebral palsy / Sz d/o / anxiety / depression: Valium 2 mg qAM + 4 mg qPM, Xanax PRN, Lamictal, Keppra, Seroquel, trazodone, Paxil CV:  Pulm: RA. Duonebs QID Endo:   GI/Nutrition:  MIVF: NS + 20 mEq K/L at 75 cc/hr GERD: Pepcid Hyponatremia Hypokalemia Hepatic:  Renal: Scr < 1 Onc:   Assessment/Plan: continue monitoring symptoms with abx treatment and Paxlovid x 5 days.

## 2022-05-09 NOTE — Discharge Summary (Signed)
Lindsey [name] [id number] DOB: [birth year] DOA: 05/07/2022  PCP: Crecencio Mc, MD  Admit date: 05/07/2022 Discharge date: 05/09/2022  Time spent: 35 minutes  Recommendations for Outpatient Follow-up:  Pcp f/u, consider endocrinology referral  CBC at pcp f/u    Discharge Diagnoses:  Active Problems:   COVID-19   Hypokalemia   Hyponatremia   Cerebral palsy (HCC)   Moderate intellectual disability   Leukopenia   Thrombocytopenia (HCC)   Anxiety and depression   Seizure disorder (HCC)   Pneumonia of left lower lobe due to infectious organism   Metabolic acidosis   Discharge Condition: improved  Diet recommendation: regular  Filed Weights   05/07/22 0010  Weight: 59 kg    History of present illness:  From admission h and p Lindsey [name] is a 37 y.o. Caucasian female with medical history significant for seizure disorder, cerebral palsy and history of partial right temporal lobe resection, recurrent UTIs, s/p vagus nerve stimulator with history of depression, presented to the emergency room with acute onset of worsening cough with associated fever and vomiting as well as anorexia for the last 3 days. No chest pain no palpitations.  No significant wheezing or dyspnea.  No diarrhea or melena or bright red being per rectum..  No reported dysuria, oliguria or hematuria or flank pain.    Hospital Course:  Patient presented with cough and vomiting. Found to have covid infection. Infiltrate on CXR, also treated for cap with ceftriaxone and azithromycin. Started on paxlovid, no hypoxemia noted. No vomiting while inpatient and tolerating diet, feeling improved. Will discharge with augmentin to complete course of abx for cap. Given paxlovid interaction with patient's seroquel I declined to prescribe paxlovid at discharge. Patient has a known history of pancytopenia and this was noted during her hospital stay, somewhat worse than baseline, likely 2/2 acute infection. Has been  followed by heme as outpt, thought to be med side effect. Advise cbc at pcp f/u. AM cortisol level noted to be low here (2.9), patient hemodynamically stable, advise consideration of endocrinology referral.   Procedures: none   Consultations: none  Discharge Exam: Vitals:   05/09/22 0517 05/09/22 0808  BP: 134/83 104/78  Pulse: 79 92  Resp: 16 16  Temp: 98 F (36.7 C) 98.2 F (36.8 C)  SpO2: 94% 99%    General: NAD Cardiovascular: RRR Respiratory: Few rhonchi, normal work of breathing  Discharge Instructions   Discharge Instructions     Diet - low sodium heart healthy   Complete by: As directed    Increase activity slowly   Complete by: As directed       Allergies as of 05/09/2022       Reactions   Other Swelling    Allergic Reaction to all nuts/ingredients   Influenza Vac Split Quad Other (See Comments)   48 hours of fever, myalgias    Benadryl [diphenhydramine Hcl]    Seizure   Montelukast Swelling   Ropinirole Swelling   Itching,    Zyrtec [cetirizine Hcl]    Seizure    Peanut-containing Drug Products Rash        Medication List     STOP taking these medications    fluconazole 150 MG tablet Commonly known as: DIFLUCAN       TAKE these medications    ALPRAZolam 0.5 MG tablet Commonly known as: XANAX Take 0.5 tablets (0.25 mg total) by mouth 2 (two) times daily as needed for anxiety.   amoxicillin-clavulanate 875-125 MG tablet Commonly  known as: AUGMENTIN Take 1 tablet by mouth 2 (two) times daily.   cephALEXin 500 MG capsule Commonly known as: KEFLEX Take 1 capsule (500 mg total) by mouth 2 (two) times daily.   chlorhexidine 0.12 % solution Commonly known as: PERIDEX 15 mLs 2 (two) times daily.   diazepam 10 MG Gel Commonly known as: DIASTAT ACUDIAL Place 20 mg rectally once. What changed: Another medication with the same name was removed. Continue taking this medication, and follow the directions you see here.   diazepam 2 MG  tablet Commonly known as: Valium 1 tablet in the am,  2 tablets in the PM   EPINEPHrine 0.3 mg/0.3 mL Soaj injection Commonly known as: EPI-PEN Inject 0.3 mg into the muscle as needed for anaphylaxis.   folic acid 1 MG tablet Commonly known as: FOLVITE Take 1 tablet (1 mg total) by mouth daily.   LaMICtal 100 MG tablet Generic drug: lamoTRIgine Take 100 mg by mouth 2 (two) times daily.   levETIRAcetam 750 MG tablet Commonly known as: Keppra Take 3 tablets (2,250 mg total) by mouth daily. TAKE 1 and a half pills twice a day   melatonin 5 MG Tabs Take 5 mg by mouth at bedtime.   PARoxetine 30 MG tablet Commonly known as: PAXIL Take 1 tablet (30 mg total) by mouth 2 (two) times daily.   QUEtiapine 25 MG tablet Commonly known as: SEROquel Take 1 tablet (25 mg total) by mouth 2 (two) times daily.   traZODone 50 MG tablet Commonly known as: DESYREL Take 0.5 tablets (25 mg total) by mouth at bedtime.       Allergies  Allergen Reactions   Other Swelling     Allergic Reaction to all nuts/ingredients   Influenza Vac Split Quad Other (See Comments)    48 hours of fever, myalgias    Benadryl [Diphenhydramine Hcl]     Seizure   Montelukast Swelling   Ropinirole Swelling    Itching,    Zyrtec [Cetirizine Hcl]     Seizure    Peanut-Containing Drug Products Rash    Follow-up Information     Crecencio Mc, MD Follow up.   Specialty: Internal Medicine Contact information: [address] [place] [address] [phone]                  The results of significant diagnostics from this hospitalization (including imaging, microbiology, ancillary and laboratory) are listed below for reference.    Significant Diagnostic Studies: DG Chest Portable 1 View  Result Date: 05/07/2022 CLINICAL DATA:  Vomiting with fever and cough. EXAM: PORTABLE CHEST 1 VIEW COMPARISON:  December 21, 2020 FINDINGS: The heart size and mediastinal contours are within normal  limits. Moderate severity left basilar infiltrate is seen. There is no evidence of a pleural effusion or pneumothorax. A left-sided neural stimulator is present and is unchanged in position. The visualized skeletal structures are unremarkable. IMPRESSION: Moderate severity left basilar infiltrate. Electronically Signed   By: Virgina Norfolk M.D.   On: 05/07/2022 01:00   CT ABDOMEN PELVIS W CONTRAST  Result Date: 04/13/2022 CLINICAL DATA:  Dysuria and hematuria EXAM: CT ABDOMEN AND PELVIS WITH CONTRAST TECHNIQUE: Multidetector CT imaging of the abdomen and pelvis was performed using the standard protocol following bolus administration of intravenous contrast. RADIATION DOSE REDUCTION: This exam was performed according to the departmental dose-optimization program which includes automated exposure control, adjustment of the mA and/or kV according to patient size and/or use of iterative reconstruction technique.  CONTRAST:  172mL OMNIPAQUE IOHEXOL 300 MG/ML  SOLN COMPARISON:  None Available. FINDINGS: Lower chest: No focal consolidation or pulmonary nodule in the lung bases. No pleural effusion or pneumothorax demonstrated. Partially imaged heart size is normal. Partially imaged implanted device in the left anterior chest wall. Hepatobiliary: No focal hepatic lesions. No intra or extrahepatic biliary ductal dilation. Normal gallbladder. Pancreas: No focal lesions or main ductal dilation. Spleen: Normal in size without focal abnormality. Adrenals/Urinary Tract: No adrenal nodules. No suspicious renal mass or hydronephrosis. Punctate nonobstructing stone in the left upper pole and right lower pole. No focal bladder wall thickening. Stomach/Bowel: Normal appearance of the stomach. No evidence of bowel wall thickening, distention, or inflammatory changes. Normal appendix (5:43). Redundant sigmoid colon extends into the right lower quadrant. Vascular/Lymphatic: No significant vascular findings are present. No enlarged  abdominal or pelvic lymph nodes. Reproductive: No adnexal masses. Other: No free fluid, fluid collection, or free air. Musculoskeletal: No acute or abnormal lytic or blastic osseous lesions. Mild anterior wedging of the T11 vertebral body. IMPRESSION: 1. No acute abdominopelvic findings. 2. Punctate nonobstructing bilateral renal calculi. Electronically Signed   By: Darrin Nipper M.D.   On: 04/13/2022 11:03   CT Cervical Spine Wo Contrast  Result Date: 04/13/2022 CLINICAL DATA:  Recent trauma EXAM: CT CERVICAL SPINE WITHOUT CONTRAST TECHNIQUE: Multidetector CT imaging of the cervical spine was performed without intravenous contrast. Multiplanar CT image reconstructions were also generated. RADIATION DOSE REDUCTION: This exam was performed according to the departmental dose-optimization program which includes automated exposure control, adjustment of the mA and/or kV according to patient size and/or use of iterative reconstruction technique. COMPARISON:  CT C Spine 01/02/22 FINDINGS: Alignment: Compared to prior exam there is increased widening of the C4-C5 disc space with new retrolisthesis of C4 on C5. There is likely also mild widening of the facet joints at this level. Skull base and vertebrae: There may also be a subtle posterosuperior endplate compression deformity (series 5, image 50). Soft tissues and spinal canal: No prevertebral fluid or swelling. No visible canal hematoma. Disc levels:  No CT evidence of high-grade spinal canal stenosis. Upper chest: Negative. Other: None. IMPRESSION: 1. Compared to prior exam there is increased widening of the C4-C5 anterior disc space with new retrolisthesis of C4 on C5. Findings are worrisome for ligamentous injury. Recommend MRI of the cervical spine for further evaluation. 2. No CT evidence of high-grade spinal canal stenosis. Findings were discussed with Dr. Gevena Barre on 04/13/22 at 10:51 AM Electronically Signed   By: Marin Roberts M.D.   On: 04/13/2022 10:57   CT  Head Wo Contrast  Result Date: 04/13/2022 CLINICAL DATA:  Provided history: Head trauma, intracranial venous injury suspected. EXAM: CT HEAD WITHOUT CONTRAST TECHNIQUE: Contiguous axial images were obtained from the base of the skull through the vertex without intravenous contrast. RADIATION DOSE REDUCTION: This exam was performed according to the departmental dose-optimization program which includes automated exposure control, adjustment of the mA and/or kV according to patient size and/or use of iterative reconstruction technique. COMPARISON:  Prior head CT examinations 01/02/2022 and earlier. FINDINGS: Brain: No age advanced or lobar predominant parenchymal atrophy. Redemonstrated postoperative sequelae from prior partial right temporal lobe resection. There is no acute intracranial hemorrhage. No acute demarcated cortical infarct. No extra-axial fluid collection. No evidence of an intracranial mass. No midline shift. Vascular: No hyperdense vessel. Skull: No fracture or aggressive osseous lesion. Right-sided cranioplasty. Sinuses/Orbits: No mass or acute finding within the imaged orbits. No significant paranasal  sinus disease at the imaged levels. IMPRESSION: No evidence of acute intracranial abnormality. Redemonstrated chronic postoperative sequelae from prior partial right temporal lobe resection. Electronically Signed   By: Kellie Simmering D.O.   On: 04/13/2022 09:21   DG Humerus Left  Result Date: 04/13/2022 CLINICAL DATA:  Fall.  Pain. EXAM: LEFT HUMERUS - 2+ VIEW COMPARISON:  None Available. FINDINGS: There is no evidence of fracture or other focal bone lesions. Soft tissues are unremarkable. IMPRESSION: Negative. Electronically Signed   By: Misty Stanley M.D.   On: 04/13/2022 07:24    Microbiology: Recent Results (from the past 240 hour(s))  Resp panel by RT-PCR (RSV, Flu A&B, Covid) Anterior Nasal Swab     Status: Abnormal   Collection Time: 05/07/22 12:22 AM   Specimen: Anterior Nasal Swab   Result Value Ref Range Status   SARS Coronavirus 2 by RT PCR POSITIVE (A) NEGATIVE Final    Comment: (NOTE) SARS-CoV-2 target nucleic acids are DETECTED.  The SARS-CoV-2 RNA is generally detectable in upper respiratory specimens during the acute phase of infection. Positive results are indicative of the presence of the identified virus, but do not rule out bacterial infection or co-infection with other pathogens not detected by the test. Clinical correlation with patient history and other diagnostic information is necessary to determine patient infection status. The expected result is Negative.  Fact Sheet for Patients: EntrepreneurPulse.com.au  Fact Sheet for Healthcare Providers: IncredibleEmployment.be  This test is not yet approved or cleared by the [place] FDA and  has been authorized for detection and/or diagnosis of SARS-CoV-2 by FDA under an Emergency Use Authorization (EUA).  This EUA will remain in effect (meaning this test can be used) for the duration of  the COVID-19 declaration under Section 564(b)(1) of the A ct, 21 U.S.C. section 360bbb-3(b)(1), unless the authorization is terminated or revoked sooner.     Influenza A by PCR NEGATIVE NEGATIVE Final   Influenza B by PCR NEGATIVE NEGATIVE Final    Comment: (NOTE) The Xpert Xpress SARS-CoV-2/FLU/RSV plus assay is intended as an aid in the diagnosis of influenza from Nasopharyngeal swab specimens and should not be used as a sole basis for treatment. Nasal washings and aspirates are unacceptable for Xpert Xpress SARS-CoV-2/FLU/RSV testing.  Fact Sheet for Patients: EntrepreneurPulse.com.au  Fact Sheet for Healthcare Providers: IncredibleEmployment.be  This test is not yet approved or cleared by the [place] FDA and has been authorized for detection and/or diagnosis of SARS-CoV-2 by FDA under an Emergency Use Authorization (EUA).  This EUA will remain in effect (meaning this test can be used) for the duration of the COVID-19 declaration under Section 564(b)(1) of the Act, 21 U.S.C. section 360bbb-3(b)(1), unless the authorization is terminated or revoked.     Resp Syncytial Virus by PCR NEGATIVE NEGATIVE Final    Comment: (NOTE) Fact Sheet for Patients: EntrepreneurPulse.com.au  Fact Sheet for Healthcare Providers: IncredibleEmployment.be  This test is not yet approved or cleared by the [place] FDA and has been authorized for detection and/or diagnosis of SARS-CoV-2 by FDA under an Emergency Use Authorization (EUA). This EUA will remain in effect (meaning this test can be used) for the duration of the COVID-19 declaration under Section 564(b)(1) of the Act, 21 U.S.C. section 360bbb-3(b)(1), unless the authorization is terminated or revoked.  Performed at [facility], [address] [place], [place] [address]   Blood culture (routine x 2)     Status: None (Preliminary result)   Collection Time: 05/07/22  3:28  AM   Specimen: BLOOD  Result Value Ref Range Status   Specimen Description BLOOD RIGHT FA  Final   Special Requests   Final    BOTTLES DRAWN AEROBIC AND ANAEROBIC Blood Culture adequate volume   Culture   Final    NO GROWTH 2 DAYS Performed at [facility], [address], [place], [place] [address]    Report Status PENDING  Incomplete  Blood culture (routine x 2)     Status: None (Preliminary result)   Collection Time: 05/07/22  3:28 AM   Specimen: BLOOD  Result Value Ref Range Status   Specimen Description BLOOD LEFT HAND  Final   Special Requests   Final    BOTTLES DRAWN AEROBIC AND ANAEROBIC Blood Culture results may not be optimal due to an excessive volume of blood received in culture bottles   Culture   Final    NO GROWTH 2 DAYS Performed at [facility], [address] [place], [place] [address]    Report  Status PENDING  Incomplete     Labs: Basic Metabolic Panel: Recent Labs  Lab 05/07/22 0022 05/07/22 1352 05/08/22 0447 05/09/22 0257  NA 129* 131* 136 132*  K 2.8* 3.6 4.6 3.9  CL 95* 102 108 103  CO2 24 23 21* 23  GLUCOSE 102* 113* 93 85  BUN 12 7 8 7   CREATININE 0.49 0.47 0.41* 0.45  CALCIUM 8.8* 8.1* 8.7* 8.2*  MG  --  1.8 2.2 2.0  PHOS  --   --   --  2.4*   Liver Function Tests: Recent Labs  Lab 05/07/22 0022  AST 19  ALT 10  ALKPHOS 64  BILITOT 0.8  PROT 7.5  ALBUMIN 3.5   Recent Labs  Lab 05/07/22 0022  LIPASE 19   No results for input(s): "AMMONIA" in the last 168 hours. CBC: Recent Labs  Lab 05/07/22 0022 05/08/22 0447 05/09/22 0257  WBC 3.4* 1.2* 1.6*  HGB 12.9 10.5* 10.4*  HCT 37.6 31.8* 31.7*  MCV 85.6 89.3 89.0  PLT 116* 99* 111*   Cardiac Enzymes: No results for input(s): "CKTOTAL", "CKMB", "CKMBINDEX", "TROPONINI" in the last 168 hours. BNP: BNP (last 3 results) No results for input(s): "BNP" in the last 8760 hours.  ProBNP (last 3 results) No results for input(s): "PROBNP" in the last 8760 hours.  CBG: No results for input(s): "GLUCAP" in the last 168 hours.     Signed:  Desma Maxim MD.  [facility] 05/09/2022, 11:13 AM

## 2022-05-10 ENCOUNTER — Telehealth: Payer: Self-pay

## 2022-05-10 NOTE — Telephone Encounter (Signed)
Transition Care Management Follow-up Telephone Call Date of discharge and from where: [place] 05/09/2022 How have you been since you were released from the hospital? better Any questions or concerns? No  Items Reviewed: Did the pt receive and understand the discharge instructions provided? Yes  Medications obtained and verified? Yes  Other? No  Any new allergies since your discharge? No  Dietary orders reviewed? Yes Do you have support at home? Yes   Home Care and Equipment/Supplies: Were home health services ordered? no If so, what is the name of the agency? N/a  Has the agency set up a time to come to the patient's home? no Were any new equipment or medical supplies ordered?  No What is the name of the medical supply agency? N/a Were you able to get the supplies/equipment? not applicable Do you have any questions related to the use of the equipment or supplies? No  Functional Questionnaire: (I = Independent and D = Dependent) ADLs: I  Bathing/Dressing- I  Meal Prep- I  Eating- I  Maintaining continence- I  Transferring/Ambulation- I  Managing Meds- I  Follow up appointments reviewed:  PCP Hospital f/u apDr [place] on 05/30/2022 @ 11:00. [facility] f/u appt confirmed? No  Are transportation arrangements needed? No  If their condition worsens, is the pt aware to call PCP or go to the Emergency Dept.? Yes Was the patient provided with contact information for the PCP's office or ED? Yes Was to pt encouraged to call back with questions or concerns? Yes Juanda Crumble, LPN [place] Direct Dial [phone]

## 2022-05-12 ENCOUNTER — Encounter: Payer: Self-pay | Admitting: Internal Medicine

## 2022-05-12 LAB — CULTURE, BLOOD (ROUTINE X 2)
Culture: NO GROWTH
Culture: NO GROWTH
Special Requests: ADEQUATE

## 2022-05-30 ENCOUNTER — Ambulatory Visit (INDEPENDENT_AMBULATORY_CARE_PROVIDER_SITE_OTHER): Payer: 59 | Admitting: Internal Medicine

## 2022-05-30 ENCOUNTER — Encounter: Payer: Self-pay | Admitting: Internal Medicine

## 2022-05-30 VITALS — BP 106/74 | HR 93 | Temp 97.4°F | Ht 66.0 in | Wt 122.6 lb

## 2022-05-30 DIAGNOSIS — D61811 Other drug-induced pancytopenia: Secondary | ICD-10-CM

## 2022-05-30 DIAGNOSIS — Z09 Encounter for follow-up examination after completed treatment for conditions other than malignant neoplasm: Secondary | ICD-10-CM

## 2022-05-30 DIAGNOSIS — I9589 Other hypotension: Secondary | ICD-10-CM

## 2022-05-30 DIAGNOSIS — E861 Hypovolemia: Secondary | ICD-10-CM | POA: Diagnosis not present

## 2022-05-30 DIAGNOSIS — D708 Other neutropenia: Secondary | ICD-10-CM

## 2022-05-30 DIAGNOSIS — D61818 Other pancytopenia: Secondary | ICD-10-CM

## 2022-05-30 DIAGNOSIS — T4275XA Adverse effect of unspecified antiepileptic and sedative-hypnotic drugs, initial encounter: Secondary | ICD-10-CM

## 2022-05-30 DIAGNOSIS — E2740 Unspecified adrenocortical insufficiency: Secondary | ICD-10-CM | POA: Diagnosis not present

## 2022-05-30 LAB — BASIC METABOLIC PANEL
BUN: 8 mg/dL (ref 6–23)
CO2: 27 mEq/L (ref 19–32)
Calcium: 9 mg/dL (ref 8.4–10.5)
Chloride: 102 mEq/L (ref 96–112)
Creatinine, Ser: 0.55 mg/dL (ref 0.40–1.20)
GFR: 119.24 mL/min (ref >60.00)
Glucose, Bld: 87 mg/dL (ref 70–99)
Potassium: 4.4 mEq/L (ref 3.5–5.1)
Sodium: 136 mEq/L (ref 135–145)

## 2022-05-30 LAB — CBC WITH DIFFERENTIAL/PLATELET
Basophils Absolute: 0 10*3/uL (ref 0.0–0.1)
Basophils Relative: 0.4 % (ref 0.0–3.0)
Eosinophils Absolute: 0 10*3/uL (ref 0.0–0.7)
Eosinophils Relative: 0 % (ref 0.0–5.0)
HCT: 40 % (ref 36.0–46.0)
Hemoglobin: 13.1 g/dL (ref 12.0–15.0)
Lymphocytes Relative: 42.4 % (ref 12.0–46.0)
Lymphs Abs: 1.3 10*3/uL (ref 0.7–4.0)
MCHC: 32.7 g/dL (ref 30.0–36.0)
MCV: 89 fl (ref 78.0–100.0)
Monocytes Absolute: 0.2 10*3/uL (ref 0.1–1.0)
Monocytes Relative: 6 % (ref 3.0–12.0)
Neutro Abs: 1.5 10*3/uL (ref 1.4–7.7)
Neutrophils Relative %: 51.2 % (ref 43.0–77.0)
Platelets: 245 10*3/uL (ref 150.0–400.0)
RBC: 4.5 Mil/uL (ref 3.87–5.11)
RDW: 13.5 % (ref 11.5–15.5)
WBC: 3 10*3/uL — ABNORMAL LOW (ref 4.0–10.5)

## 2022-05-30 NOTE — Progress Notes (Unsigned)
Subjective:  Patient ID: Lindsey [name], female    DOB: 01-Jul-1985  Age: 37 y.o. MRN: [id number]  CC: There were no encounter diagnoses.   HPI MARISELLA PUCCIO presents for  Chief Complaint  Patient presents with   Hospitalization [facility] follow up from [place] was admitted to [facility] with  3 day history of cough, fevers, weakness and nausea/vomiting attributed to COVID infection .  Treated with 2 abx for CXR proven pneumonia ceftriaxone/azithromcyin  transitioned to augmentin at discharge. Paxlovid  AVOIDED due to use of seroquel . Entire family became ill;  patient's grandather died last  09-09-22 at home  with Hospice.   Patient's mother brought her today (per usual, Shabnam Ladd) and has been dealing with left sided kidney stone which became symptomatic on Friday as well as an episode of diverticulitis also started on Sunday.  CT scan done t  mom is currently taking  2 abx.   Low am cortisol  noted  Chronic pancytopenia     Outpatient Medications Prior to Visit  Medication Sig Dispense Refill   ALPRAZolam (XANAX) 0.25 MG tablet Take 0.25 mg by mouth.     chlorhexidine (PERIDEX) 0.12 % solution 15 mLs 2 (two) times daily.     diazepam (DIASTAT ACUDIAL) 10 MG GEL Place 20 mg rectally once.     diazepam (VALIUM) 2 MG tablet 1 tablet in the am,  2 tablets in the PM 90 tablet 5   EPINEPHrine 0.3 mg/0.3 mL IJ SOAJ injection Inject 0.3 mg into the muscle as needed for anaphylaxis. 1 each 1   folic acid (FOLVITE) 1 MG tablet Take 1 tablet (1 mg total) by mouth daily. 30 tablet 2   LAMICTAL 100 MG tablet Take 100 mg by mouth 2 (two) times daily.      levETIRAcetam (KEPPRA) 750 MG tablet Take 3 tablets (2,250 mg total) by mouth daily. TAKE 1 and a half pills twice a day 90 tablet 0   melatonin 5 MG TABS Take 5 mg by mouth at bedtime.     PARoxetine (PAXIL) 30 MG tablet Take 1 tablet (30 mg total) by mouth 2 (two) times daily. 180 tablet 1   QUEtiapine (SEROQUEL) 25  MG tablet Take 1 tablet (25 mg total) by mouth 2 (two) times daily. 180 tablet 1   traZODone (DESYREL) 50 MG tablet Take 0.5 tablets (25 mg total) by mouth at bedtime. 90 tablet 1   ALPRAZolam (XANAX) 0.5 MG tablet Take 0.5 tablets (0.25 mg total) by mouth 2 (two) times daily as needed for anxiety. (Patient not taking: Reported on 05/30/2022) 60 tablet 2   cephALEXin (KEFLEX) 500 MG capsule Take 1 capsule (500 mg total) by mouth 2 (two) times daily. (Patient not taking: Reported on 05/07/2022) 14 capsule 0   amoxicillin-clavulanate (AUGMENTIN) 875-125 MG tablet Take 1 tablet by mouth 2 (two) times daily. 8 tablet 0   No facility-administered medications prior to visit.    Review of Systems;  Patient denies headache, fevers, malaise, unintentional weight loss, skin rash, eye pain, sinus congestion and sinus pain, sore throat, dysphagia,  hemoptysis , cough, dyspnea, wheezing, chest pain, palpitations, orthopnea, edema, abdominal pain, nausea, melena, diarrhea, constipation, flank pain, dysuria, hematuria, urinary  Frequency, nocturia, numbness, tingling, seizures,  Focal weakness, Loss of consciousness,  Tremor, insomnia, depression, anxiety, and suicidal ideation.      Objective:  BP 106/74   Pulse 93   Temp (!) 97.4 F (36.3  C) (Oral)   Ht 5\' 6"  (1.676 m)   Wt 122 lb 9.6 oz (55.6 kg)   LMP 01/09/2011   SpO2 95%   BMI 19.79 kg/m   BP Readings from Last 3 Encounters:  05/30/22 106/74  05/09/22 104/78  04/20/22 112/76    Wt Readings from Last 3 Encounters:  05/30/22 122 lb 9.6 oz (55.6 kg)  05/07/22 130 lb 1.1 oz (59 kg)  04/20/22 130 lb 3.2 oz (59.1 kg)    Physical Exam  Lab Results  Component Value Date   HGBA1C 4.7 03/19/2022   HGBA1C 4.9 03/16/2021    Lab Results  Component Value Date   CREATININE 0.45 05/09/2022   CREATININE 0.41 (L) 05/08/2022   CREATININE 0.47 05/07/2022    Lab Results  Component Value Date   WBC 1.6 (L) 05/09/2022   HGB 10.4 (L) 05/09/2022    HCT 31.7 (L) 05/09/2022   PLT 111 (L) 05/09/2022   GLUCOSE 85 05/09/2022   CHOL 129 03/19/2022   TRIG 78.0 03/19/2022   HDL 32.30 (L) 03/19/2022   LDLCALC 81 03/19/2022   ALT 10 05/07/2022   AST 19 05/07/2022   NA 132 (L) 05/09/2022   K 3.9 05/09/2022   CL 103 05/09/2022   CREATININE 0.45 05/09/2022   BUN 7 05/09/2022   CO2 23 05/09/2022   TSH 1.51 03/19/2022   INR 1.2 05/08/2022   HGBA1C 4.7 03/19/2022    DG Chest Portable 1 View  Result Date: 05/07/2022 CLINICAL DATA:  Vomiting with fever and cough. EXAM: PORTABLE CHEST 1 VIEW COMPARISON:  December 21, 2020 FINDINGS: The heart size and mediastinal contours are within normal limits. Moderate severity left basilar infiltrate is seen. There is no evidence of a pleural effusion or pneumothorax. A left-sided neural stimulator is present and is unchanged in position. The visualized skeletal structures are unremarkable. IMPRESSION: Moderate severity left basilar infiltrate. Electronically Signed   By: Virgina Norfolk M.D.   On: 05/07/2022 01:00    Assessment & Plan:  .There are no diagnoses linked to this encounter.   I provided 30 minutes of face-to-face time during this encounter reviewing patient's last visit with me, patient's  most recent visit with cardiology,  nephrology,  and neurology,  recent surgical and non surgical procedures, previous  labs and imaging studies, counseling on currently addressed issues,  and post visit ordering to diagnostics and therapeutics .   Follow-up: No follow-ups on file.   Crecencio Mc, MD

## 2022-05-31 DIAGNOSIS — Z09 Encounter for follow-up examination after completed treatment for conditions other than malignant neoplasm: Secondary | ICD-10-CM | POA: Insufficient documentation

## 2022-05-31 DIAGNOSIS — E2740 Unspecified adrenocortical insufficiency: Secondary | ICD-10-CM | POA: Insufficient documentation

## 2022-05-31 LAB — CORTISOL-AM, BLOOD: Cortisol - AM: 9.2 ug/dL

## 2022-05-31 NOTE — Assessment & Plan Note (Signed)
Chronic ,  aggravated by COVID infection, now improved

## 2022-05-31 NOTE — Assessment & Plan Note (Signed)
Aggravated by COVID infection.  resolved  on repeat CBC.     Lab Results  Component Value Date   WBC 3.0 (L) 05/30/2022   HGB 13.1 05/30/2022   HCT 40.0 05/30/2022   MCV 89.0 05/30/2022   PLT 245.0 05/30/2022

## 2022-05-31 NOTE — Assessment & Plan Note (Signed)
Occurred in the setting of COVID infection.  Resolved by repeat random cortisol level

## 2022-05-31 NOTE — Assessment & Plan Note (Addendum)
Patient is stable post discharge and has no new issues or questions about discharge plans at the visit today for hospital follow up. All labs , imaging studies and progress notes from admission were reviewed with patient's mother today

## 2022-06-01 ENCOUNTER — Other Ambulatory Visit: Payer: Self-pay | Admitting: Internal Medicine

## 2022-07-05 ENCOUNTER — Other Ambulatory Visit (HOSPITAL_COMMUNITY): Payer: Self-pay

## 2022-07-19 ENCOUNTER — Other Ambulatory Visit: Payer: Self-pay | Admitting: Internal Medicine

## 2022-07-26 ENCOUNTER — Ambulatory Visit
Admission: RE | Admit: 2022-07-26 | Discharge: 2022-07-26 | Disposition: A | Discharge status: Home / Self Care | Payer: 59 | Source: Ambulatory Visit

## 2022-07-26 VITALS — BP 99/81 | HR 90 | Temp 98.0°F | Resp 16

## 2022-07-26 DIAGNOSIS — J069 Acute upper respiratory infection, unspecified: Secondary | ICD-10-CM | POA: Diagnosis not present

## 2022-07-26 NOTE — ED Triage Notes (Signed)
Patient presents to UC for sore throat and cough since 1 day. Taking coricidin. Caregiver concerned with sinus infection.

## 2022-07-26 NOTE — ED Provider Notes (Signed)
Lindsey [name]    CSN: [id number] Arrival date & time: 07/26/22  1504      History   Chief Complaint Chief Complaint  Patient presents with   Sore Throat    Entered by patient   Cough    HPI Lindsey [name] is a 37 y.o. female.    Sore Throat  Cough   Presents to urgent care with complaint of sore throat and cough x 1 day.  Concerned with sinus infection. Mom states she (mom) started a z-pak that her provider had prescribed in January and her own symptoms started feeling better.  Past Medical History:  Diagnosis Date   COVID-19 12/2020   Epilepsy [facility]) age 78   negative metabolic workup has brain abnormalities by MRI (Dr. Lisabeth Devoid, [facility])   Hypertropia of left eye    Insomnia    Intellectual disability    Partial epilepsy with impairment of consciousness, intractable ([place])    Psychomotor agitation    Seizures ([place])    None since approx 2018   Status post VNS (vagus nerve stimulator) placement 05/07/1998   Temporal lobectomy behavior syndrome 05/07/1992   Right   Torticollis, ocular     Patient Active Problem List   Diagnosis Date Noted   Adrenal insufficiency, primary, infectious ([place]) 05/31/2022   Hospital discharge follow-up 05/31/2022   Low serum cortisol level [id number]   Metabolic acidosis [id number]   Anxiety and depression 05/07/2022   Seizure disorder ([place]) 05/07/2022   Hypokalemia 05/07/2022   Pneumonia of left lower lobe due to infectious organism 05/07/2022   Leukopenia 03/27/2022   Anticonvulsant-induced pancytopenia ([place]) 03/27/2022   Recurrent epistaxis 02/09/2022   Hyponatremia 07/10/2021   Allergic rhinitis due to grass pollen 07/10/2021   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   COVID-19 12/21/2020   Drug-induced leukopenia ([place]) 06/29/2020   Change in behavior 05/04/2020   Moderate intellectual disability 05/04/2020   Poor dentition 09/02/2017   Constipation 11/10/2015   Long-term use of high-risk  medication 10/11/2015   Xerosis of skin 12/12/2014   Restless legs syndrome 07/12/2014   Restlessness and agitation 09/10/2012   Routine general medical examination at a health care facility 06/08/2012   Cerebral palsy ([place]) 05/08/2012   Acneiform dermatitis 06/30/2011   Partial epilepsy with intractable epilepsy ([place]) 03/23/2011    Past Surgical History:  Procedure Laterality Date   ABDOMINAL HYSTERECTOMY  2005   BRAIN SURGERY     Temporal Lobe resection   CRANIOTOMY FOR TEMPORAL LOBECTOMY     TONSILLECTOMY AND ADENOIDECTOMY  age 41    OB History   No obstetric history on file.      Home Medications    Prior to Admission medications   Medication Sig Start Date End Date Taking? Authorizing Provider  ALPRAZolam Duanne Moron) 0.25 MG tablet TAKE ONE TABLET TWICE A DAY IF NEEDED FOR ANXIETY 07/19/22   Crecencio Mc, MD  ALPRAZolam Duanne Moron) 0.5 MG tablet Take 0.5 tablets (0.25 mg total) by mouth 2 (two) times daily as needed for anxiety. Patient not taking: Reported on 05/30/2022 04/20/22   Crecencio Mc, MD  cephALEXin (KEFLEX) 500 MG capsule Take 1 capsule (500 mg total) by mouth 2 (two) times daily. Patient not taking: Reported on 05/07/2022 04/13/22   Harvest Dark, MD  chlorhexidine (PERIDEX) 0.12 % solution 15 mLs 2 (two) times daily. 02/10/20   [provider]  diazepam (DIASTAT ACUDIAL) 10 MG GEL Place 20 mg rectally once.    [provider]  diazepam (  VALIUM) 2 MG tablet 1 tablet in the am,  2 tablets in the PM 03/19/22   Crecencio Mc, MD  EPINEPHrine 0.3 mg/0.3 mL IJ SOAJ injection Inject 0.3 mg into the muscle as needed for anaphylaxis. 08/14/21   Crecencio Mc, MD  LAMICTAL 100 MG tablet Take 100 mg by mouth 2 (two) times daily.  08/03/16   [provider]  levETIRAcetam (KEPPRA) 750 MG tablet Take 3 tablets (2,250 mg total) by mouth daily. TAKE 1 and a half pills twice a day 04/12/20   Clapacs, John T, MD  melatonin 5 MG TABS Take 5 mg by mouth  at bedtime.    [provider]  Melatonin-Pyridoxine 5-1 MG TABS Take by mouth.    [provider]  PARoxetine (PAXIL) 30 MG tablet Take 1 tablet (30 mg total) by mouth 2 (two) times daily. 04/21/22   Crecencio Mc, MD  QUEtiapine (SEROQUEL) 25 MG tablet TAKE ONE TABLET BY MOUTH TWICE DAILY 06/01/22   Crecencio Mc, MD  traZODone (DESYREL) 50 MG tablet Take 0.5 tablets (25 mg total) by mouth at bedtime. 10/20/21   Crecencio Mc, MD    Family History History reviewed. No pertinent family history.  Social History Social History   Tobacco Use   Smoking status: Never   Smokeless tobacco: Never  Vaping Use   Vaping Use: Never used  Substance Use Topics   Alcohol use: No    Alcohol/week: 0.0 standard drinks of alcohol   Drug use: No     Allergies   Other, Influenza vac split quad, Benadryl [diphenhydramine hcl], Montelukast, Ropinirole, Zyrtec [cetirizine hcl], and Peanut-containing drug products   Review of Systems Review of Systems  Respiratory:  Positive for cough.      Physical Exam Triage Vital Signs ED Triage Vitals [07/26/22 1517]  Enc Vitals Group     BP 99/81     Pulse Rate 90     Resp 16     Temp 98 F (36.7 C)     Temp Source Temporal     SpO2 99 %     Weight      Height      Head Circumference      Peak Flow      Pain Score      Pain Loc      Pain Edu?      Excl. in [place]?    No data found.  Updated Vital Signs BP 99/81 (BP Location: Left Arm)   Pulse 90   Temp 98 F (36.7 C) (Temporal)   Resp 16   LMP 01/09/2011   SpO2 99%   Visual Acuity Right Eye Distance:   Left Eye Distance:   Bilateral Distance:    Right Eye Near:   Left Eye Near:    Bilateral Near:     Physical Exam Vitals reviewed.  Constitutional:      Appearance: She is well-developed.  HENT:     Mouth/Throat:     Mouth: Mucous membranes are moist.     Pharynx: No oropharyngeal exudate or posterior oropharyngeal erythema.     Tonsils: No tonsillar  exudate.  Cardiovascular:     Rate and Rhythm: Normal rate and regular rhythm.     Heart sounds: Normal heart sounds.  Pulmonary:     Effort: Pulmonary effort is normal.     Breath sounds: Normal breath sounds.  Skin:    General: Skin is warm and dry.  Neurological:  General: No focal deficit present.     Mental Status: She is alert and oriented to person, place, and time.  Psychiatric:        Mood and Affect: Mood normal.        Behavior: Behavior normal.      UC Treatments / Results  Labs (all labs ordered are listed, but only abnormal results are displayed) Labs Reviewed  POCT RAPID STREP A (OFFICE)    EKG   Radiology No results found.  Procedures Procedures (including critical care time)  Medications Ordered in UC Medications - No data to display  Initial Impression / Assessment and Plan / UC Course  I have reviewed the triage vital signs and the nursing notes.  Pertinent labs & imaging results that were available during my care of the patient were reviewed by me and considered in my medical decision making (see chart for details).   Patient is afebrile here without recent antipyretics. Satting well on room air. Overall is well appearing, well hydrated, without respiratory distress. Pulmonary exam is unremarkable.  Lungs CTAB without wheezing, rhonchi, rales.  No pharyngeal erythema or peritonsillar exudates  Patient's symptoms are consistent with an acute viral process.  Low suspicion for flu or COVID.  Discussed antibiotic stewardship with patient's mom who prefers that she be treated with an antibiotic due to her "frequent seizures".  Recommended continued use of over-the-counter medication for control of her symptoms.  Patient's mom acknowledges understanding this treatment plan.  Final Clinical Impressions(s) / UC Diagnoses   Final diagnoses:  None   Discharge Instructions   None    ED Prescriptions   None    PDMP not reviewed this encounter.    Rose Phi, [place] 07/26/22 1531

## 2022-07-26 NOTE — Discharge Instructions (Addendum)
Follow up here or with your primary care provider if your symptoms are worsening or not improving.     

## 2022-08-08 ENCOUNTER — Telehealth: Payer: Self-pay | Admitting: Internal Medicine

## 2022-08-08 NOTE — Telephone Encounter (Signed)
Pt mom called stating for the past couple of nights pt wakes up cryig saying she sees ghosts and she is pulling at the right side of her head. Pt mom just wanted the provider to know before she comes in for her appointment tomorrow

## 2022-08-08 NOTE — Telephone Encounter (Signed)
FYI

## 2022-08-09 ENCOUNTER — Telehealth: Payer: Self-pay | Admitting: Internal Medicine

## 2022-08-09 ENCOUNTER — Ambulatory Visit (INDEPENDENT_AMBULATORY_CARE_PROVIDER_SITE_OTHER): Payer: 59 | Admitting: Internal Medicine

## 2022-08-09 ENCOUNTER — Encounter: Payer: Self-pay | Admitting: Internal Medicine

## 2022-08-09 VITALS — BP 110/80 | HR 86 | Temp 97.8°F | Ht 66.0 in | Wt 113.6 lb

## 2022-08-09 DIAGNOSIS — D61811 Other drug-induced pancytopenia: Secondary | ICD-10-CM

## 2022-08-09 DIAGNOSIS — R7989 Other specified abnormal findings of blood chemistry: Secondary | ICD-10-CM

## 2022-08-09 DIAGNOSIS — E872 Acidosis, unspecified: Secondary | ICD-10-CM | POA: Diagnosis not present

## 2022-08-09 DIAGNOSIS — E871 Hypo-osmolality and hyponatremia: Secondary | ICD-10-CM

## 2022-08-09 DIAGNOSIS — F419 Anxiety disorder, unspecified: Secondary | ICD-10-CM

## 2022-08-09 DIAGNOSIS — K089 Disorder of teeth and supporting structures, unspecified: Secondary | ICD-10-CM

## 2022-08-09 DIAGNOSIS — D708 Other neutropenia: Secondary | ICD-10-CM

## 2022-08-09 DIAGNOSIS — F32A Depression, unspecified: Secondary | ICD-10-CM

## 2022-08-09 DIAGNOSIS — T4275XA Adverse effect of unspecified antiepileptic and sedative-hypnotic drugs, initial encounter: Secondary | ICD-10-CM

## 2022-08-09 DIAGNOSIS — J40 Bronchitis, not specified as acute or chronic: Secondary | ICD-10-CM

## 2022-08-09 DIAGNOSIS — R634 Abnormal weight loss: Secondary | ICD-10-CM

## 2022-08-09 DIAGNOSIS — E538 Deficiency of other specified B group vitamins: Secondary | ICD-10-CM

## 2022-08-09 LAB — CBC WITH DIFFERENTIAL/PLATELET
Basophils Absolute: 0 10*3/uL (ref 0.0–0.1)
Basophils Relative: 0.4 % (ref 0.0–3.0)
Eosinophils Absolute: 0 10*3/uL (ref 0.0–0.7)
Eosinophils Relative: 0 % (ref 0.0–5.0)
HCT: 41 % (ref 36.0–46.0)
Hemoglobin: 13.8 g/dL (ref 12.0–15.0)
Lymphocytes Relative: 36.2 % (ref 12.0–46.0)
Lymphs Abs: 1.2 10*3/uL (ref 0.7–4.0)
MCHC: 33.6 g/dL (ref 30.0–36.0)
MCV: 87.6 fl (ref 78.0–100.0)
Monocytes Absolute: 0.2 10*3/uL (ref 0.1–1.0)
Monocytes Relative: 5.4 % (ref 3.0–12.0)
Neutro Abs: 1.9 10*3/uL (ref 1.4–7.7)
Neutrophils Relative %: 58 % (ref 43.0–77.0)
Platelets: 206 10*3/uL (ref 150.0–400.0)
RBC: 4.68 Mil/uL (ref 3.87–5.11)
RDW: 14.1 % (ref 11.5–15.5)
WBC: 3.2 10*3/uL — ABNORMAL LOW (ref 4.0–10.5)

## 2022-08-09 LAB — B12 AND FOLATE PANEL
Folate: 4.4 ng/mL — ABNORMAL LOW (ref >5.9)
Vitamin B-12: 450 pg/mL (ref 211–911)

## 2022-08-09 LAB — COMPREHENSIVE METABOLIC PANEL
ALT: 9 U/L (ref 0–35)
AST: 18 U/L (ref 0–37)
Albumin: 4.1 g/dL (ref 3.5–5.2)
Alkaline Phosphatase: 100 U/L (ref 39–117)
BUN: 6 mg/dL (ref 6–23)
CO2: 28 mEq/L (ref 19–32)
Calcium: 9.3 mg/dL (ref 8.4–10.5)
Chloride: 99 mEq/L (ref 96–112)
Creatinine, Ser: 0.64 mg/dL (ref 0.40–1.20)
GFR: 114.81 mL/min (ref >60.00)
Glucose, Bld: 84 mg/dL (ref 70–99)
Potassium: 3.9 mEq/L (ref 3.5–5.1)
Sodium: 137 mEq/L (ref 135–145)
Total Bilirubin: 0.4 mg/dL (ref 0.2–1.2)
Total Protein: 7 g/dL (ref 6.0–8.3)

## 2022-08-09 LAB — TSH: TSH: 1.16 u[IU]/mL (ref 0.35–5.50)

## 2022-08-09 LAB — HEMOGLOBIN A1C: Hgb A1c MFr Bld: 4.4 % — ABNORMAL LOW (ref 4.6–6.5)

## 2022-08-09 MED ORDER — CHLORHEXIDINE GLUCONATE 0.12 % MT SOLN: 15.0000 mL | Freq: Two times a day (BID) | OROMUCOSAL | 1 refills | Status: DC

## 2022-08-09 MED ORDER — AZITHROMYCIN 500 MG PO TABS: 500.0000 mg | ORAL_TABLET | Freq: Every day | ORAL | 0 refills | Status: DC

## 2022-08-09 MED ORDER — ALPRAZOLAM 0.25 MG PO TABS: ORAL_TABLET | ORAL | 2 refills | Status: DC

## 2022-08-09 MED ORDER — PREDNISONE 10 MG PO TABS: ORAL_TABLET | ORAL | 0 refills | Status: DC

## 2022-08-09 MED ORDER — QUETIAPINE FUMARATE 25 MG PO TABS: ORAL_TABLET | ORAL | 1 refills | Status: DC

## 2022-08-09 NOTE — Patient Instructions (Addendum)
1) Prednisone taper, azithromycin and delsym for the cough  2) for the anxiety  :  continue paxil.  Reduce alprazolam to 1/2 tablet in the morning  continue full tablet in the evening for now  Increase seroquel evening dose to 50 mg  Continue  paxil 30 mg    3) reestablish [facility]ry at [facility] , not locally.  Lindsey [name] is too complicated for community physicians to provide comprehensive management and they won't have access to her neurology records    4) Folate level Is low because she is not eating vegetables.  Supplement with chewable multivitamin and encourage more vegetables  encouarge less cookies and more protein fruits and vegetables  5) resume chlorhexidine rinse for her dental caries and gum disease

## 2022-08-09 NOTE — Telephone Encounter (Signed)
Spoke to [place] and informed her rx was sent in

## 2022-08-09 NOTE — Telephone Encounter (Signed)
Patient dropped off a Physicians[facility] order for [employer]. Form is up front in Dr Lupita Dawn color folder.

## 2022-08-09 NOTE — Progress Notes (Signed)
Subjective:  Patient ID: Lindsey [name], female    DOB: 1986-01-20  Age: 37 y.o. MRN: [id number]  CC: The primary encounter diagnosis was Metabolic acidosis. Diagnoses of Low serum cortisol level, Hyponatremia, Other neutropenia, Anxiety and depression, Bronchitis, Unintentional weight loss, Anticonvulsant-induced pancytopenia, Poor dentition, and Folic acid deficiency (non anemic) were also pertinent to this visit.   HPI Lindsey [name] presents for follow up on muliptle issues Chief Complaint  Patient presents with   Cough    Past 2 weeks    1) increased anxiety:  getting up at night,  crying that she is  "seeing ghosts"  .  Mother was told by one of her providers that this was a manifestation of her seizure disorder.  Patient has not seen [facility], in a year,  her mother is requesting to  see psychiatrist in town  because of decreased continuity Duwayne Heck(Rivka [facility] . Last appt was apparently last summer (no notes available via EPIC).  2) Weight loss:  patient has  9 lbs  since last visit.  She is eating 3 times daily ,but  only 30-40% of meal. Mom is  allowing her to eat unlimited amounts of cookies  and allows her to eat cheeseburgers and mac n cheese rather than argue with her about eating healthier    3) persistent cough worse at night disrupting sleep; has been present since march 20.  She was evaluated and treated at urgent care on March 21 for viral syndrome.  Mother's request for antibiotics  was deferred .      Outpatient Medications Prior to Visit  Medication Sig Dispense Refill   diazepam (DIASTAT ACUDIAL) 10 MG GEL Place 20 mg rectally once.     diazepam (VALIUM) 2 MG tablet 1 tablet in the am,  2 tablets in the PM 90 tablet 5   EPINEPHrine 0.3 mg/0.3 mL IJ SOAJ injection Inject 0.3 mg into the muscle as needed for anaphylaxis. 1 each 1   LAMICTAL 100 MG tablet Take 100 mg by mouth 2 (two) times daily.       levETIRAcetam (KEPPRA) 750 MG tablet Take 3 tablets (2,250 mg total) by mouth daily. TAKE 1 and a half pills twice a day 90 tablet 0   melatonin 5 MG TABS Take 5 mg by mouth at bedtime.     Melatonin-Pyridoxine 5-1 MG TABS Take by mouth.     PARoxetine (PAXIL) 30 MG tablet Take 1 tablet (30 mg total) by mouth 2 (two) times daily. 180 tablet 1   traZODone (DESYREL) 50 MG tablet Take 0.5 tablets (25 mg total) by mouth at bedtime. 90 tablet 1   ALPRAZolam (XANAX) 0.25 MG tablet TAKE ONE TABLET TWICE A DAY IF NEEDED FOR ANXIETY 45 tablet 2   ALPRAZolam (XANAX) 0.5 MG tablet Take 0.5 tablets (0.25 mg total) by mouth 2 (two) times daily as needed for anxiety. 60 tablet 2   chlorhexidine (PERIDEX) 0.12 % solution 15 mLs 2 (two) times daily.     QUEtiapine (SEROQUEL) 25 MG tablet TAKE ONE TABLET BY MOUTH TWICE DAILY 180 tablet 1   cephALEXin (KEFLEX) 500 MG capsule Take 1 capsule (500 mg total) by mouth 2 (two) times daily. (Patient not taking: Reported on 05/07/2022) 14 capsule 0   No facility-administered medications prior to visit.    Review of Systems;  Patient denies headache, fevers, malaise, skin rash, eye pain, sinus congestion and sinus pain, sore throat, dysphagia,  hemoptysis ,  dyspnea, wheezing, chest pain, palpitations, orthopnea, edema, abdominal pain, nausea, melena, diarrhea, constipation, flank pain, dysuria, hematuria, urinary  Frequency, nocturia, numbness, tingling,   Focal weakness, Loss of consciousness,  Tremor, insomnia, depression, and suicidal ideation.      Objective:  BP 110/80   Pulse 86   Temp 97.8 F (36.6 C) (Oral)   Ht 5\' 6"  (1.676 m)   Wt 113 lb 9.6 oz (51.5 kg)   LMP 01/09/2011   SpO2 99%   BMI 18.34 kg/m   BP Readings from Last 3 Encounters:  08/09/22 110/80  07/26/22 99/81  05/30/22 106/74    Wt Readings from Last 3 Encounters:  08/09/22 113 lb 9.6 oz (51.5 kg)  05/30/22 122 lb 9.6 oz (55.6 kg)  05/07/22 130 lb 1.1 oz (59 kg)    Physical  Exam Vitals reviewed.  Constitutional:      General: She is not in acute distress.    Appearance: Normal appearance. She is normal weight. She is not ill-appearing, toxic-appearing or diaphoretic.  HENT:     Head: Normocephalic.     Right Ear: There is impacted cerumen.     Left Ear: There is impacted cerumen.  Eyes:     General: No scleral icterus.       Right eye: No discharge.        Left eye: No discharge.     Conjunctiva/sclera: Conjunctivae normal.  Cardiovascular:     Rate and Rhythm: Normal rate and regular rhythm.     Heart sounds: Normal heart sounds.  Pulmonary:     Effort: Pulmonary effort is normal. No respiratory distress.     Breath sounds: Normal breath sounds.  Musculoskeletal:        General: Normal range of motion.  Skin:    General: Skin is warm and dry.  Neurological:     General: No focal deficit present.     Mental Status: She is alert and oriented to person, place, and time. Mental status is at baseline.  Psychiatric:        Mood and Affect: Mood normal.        Behavior: Behavior normal.        Thought Content: Thought content normal.        Judgment: Judgment normal.     Lab Results  Component Value Date   HGBA1C 4.4 (L) 08/09/2022   HGBA1C 4.7 03/19/2022   HGBA1C 4.9 03/16/2021    Lab Results  Component Value Date   CREATININE 0.64 08/09/2022   CREATININE 0.55 05/30/2022   CREATININE 0.45 05/09/2022    Lab Results  Component Value Date   WBC 3.2 (L) 08/09/2022   HGB 13.8 08/09/2022   HCT 41.0 08/09/2022   PLT 206.0 08/09/2022   GLUCOSE 84 08/09/2022   CHOL 129 03/19/2022   TRIG 78.0 03/19/2022   HDL 32.30 (L) 03/19/2022   LDLCALC 81 03/19/2022   ALT 9 08/09/2022   AST 18 08/09/2022   NA 137 08/09/2022   K 3.9 08/09/2022   CL 99 08/09/2022   CREATININE 0.64 08/09/2022   BUN 6 08/09/2022   CO2 28 08/09/2022   TSH 1.16 08/09/2022   INR 1.2 05/08/2022   HGBA1C 4.4 (L) 08/09/2022    No results found.  Assessment & Plan:   .Metabolic acidosis -     Hemoglobin A1c  Low serum cortisol level Assessment & Plan: Repeat level is normal and lytes are normal.   Orders: -  TSH -     Cortisol-am, blood  Hyponatremia -     Comprehensive metabolic panel  Other neutropenia -     CBC with Differential/Platelet -     B12 and Folate Panel  Anxiety and depression Assessment & Plan: Counselled mother that providing more sedation is not appropriate .  Bhavioral therapy and follow up with [facility] is recommended. I have reduced her morning dose of alprazolam  and increased her evening dose of seroquel to 50 mg   Orders: -     Ambulatory referral to Psychiatry  Bronchitis Assessment & Plan: Will provide empiric abx coverage with azithromycin and prednisone  given failure to resolve ater 10 days of conservative therapy    Unintentional weight loss Assessment & Plan: ADVISED MOTHER to encouarge better eating habits give patient's folate deficiency    Anticonvulsant-induced pancytopenia Assessment & Plan: Resolved.  She continues to have a mild leukopenia only.   Lab Results  Component Value Date   WBC 3.2 (L) 08/09/2022   HGB 13.8 08/09/2022   HCT 41.0 08/09/2022   MCV 87.6 08/09/2022   PLT 206.0 08/09/2022      Poor dentition Assessment & Plan: Advised to resume antimicrobial rinse based on today's exam    Folic acid deficiency (non anemic) Assessment & Plan: Secondary to poor diet.  Advised mother to start a MVI    Other orders -     predniSONE; 6 tablets on Day 1 , then reduce by 1 tablet daily until gone  Dispense: 21 tablet; Refill: 0 -     Azithromycin; Take 1 tablet (500 mg total) by mouth daily.  Dispense: 7 tablet; Refill: 0 -     Chlorhexidine Gluconate; Use as directed 15 mLs in the mouth or throat 2 (two) times daily.  Dispense: 120 mL; Refill: 1     I provided 27 minutes of face-to-face time during this encounter  with patient and her mother reviewing patient's last  visit with  neurology,   her most recewt ER and Urgetn Care visits previous  labs and imaging studies, counseling on  Lajuanda's poor diet and behavioral issues,   and post visit ordering of diagnostics and therapeutics .   Follow-up: Return in about 4 weeks (around 09/06/2022).   Sherlene Shamseresa L Skye Rodarte, MD

## 2022-08-09 NOTE — Telephone Encounter (Signed)
Pt mom called stating she would like a new [facility] stating she takes SEROQUEL one pill in the am and two in the pm also Xanax half a pill in am and full pill in pm

## 2022-08-09 NOTE — Telephone Encounter (Signed)
Lft pt vm regarding referral. thanks

## 2022-08-10 LAB — CORTISOL-AM, BLOOD: Cortisol - AM: 9.9 ug/dL

## 2022-08-12 DIAGNOSIS — E538 Deficiency of other specified B group vitamins: Secondary | ICD-10-CM | POA: Insufficient documentation

## 2022-08-12 DIAGNOSIS — J40 Bronchitis, not specified as acute or chronic: Secondary | ICD-10-CM | POA: Insufficient documentation

## 2022-08-12 NOTE — Assessment & Plan Note (Signed)
Resolved.  She continues to have a mild leukopenia only.   Lab Results  Component Value Date   WBC 3.2 (L) 08/09/2022   HGB 13.8 08/09/2022   HCT 41.0 08/09/2022   MCV 87.6 08/09/2022   PLT 206.0 08/09/2022

## 2022-08-12 NOTE — Assessment & Plan Note (Addendum)
Counselled mother that providing more sedation is not appropriate .  Bhavioral therapy and follow up with [facility] is recommended. I have reduced her morning dose of alprazolam  and increased her evening dose of seroquel to 50 mg

## 2022-08-12 NOTE — Assessment & Plan Note (Signed)
Advised to resume antimicrobial rinse based on today's exam

## 2022-08-12 NOTE — Assessment & Plan Note (Addendum)
Will provide empiric abx coverage with azithromycin and prednisone  given failure to resolve ater 10 days of conservative therapy

## 2022-08-12 NOTE — Assessment & Plan Note (Signed)
Secondary to poor diet.  Advised mother to start a MVI

## 2022-08-12 NOTE — Assessment & Plan Note (Signed)
Repeat level is normal and lytes are normal.

## 2022-08-12 NOTE — Assessment & Plan Note (Signed)
ADVISED MOTHER to encouarge better eating habits give patient's folate deficiency

## 2022-08-13 NOTE — Telephone Encounter (Signed)
Form is ready and pt's mother is aware.

## 2022-08-13 NOTE — Telephone Encounter (Signed)
Patient's mother called about Physicians order, please call when ready.

## 2022-08-13 NOTE — Telephone Encounter (Signed)
Placed up front for pickup ° °

## 2022-08-19 ENCOUNTER — Encounter: Payer: Self-pay | Admitting: Internal Medicine

## 2022-08-21 ENCOUNTER — Encounter: Payer: Self-pay | Admitting: Internal Medicine

## 2022-08-21 ENCOUNTER — Other Ambulatory Visit: Payer: Self-pay | Admitting: Internal Medicine

## 2022-08-21 DIAGNOSIS — R4689 Other symptoms and signs involving appearance and behavior: Secondary | ICD-10-CM

## 2022-08-21 DIAGNOSIS — G40119 Localization-related (focal) (partial) symptomatic epilepsy and epileptic syndromes with simple partial seizures, intractable, without status epilepticus: Secondary | ICD-10-CM

## 2022-08-21 DIAGNOSIS — G809 Cerebral palsy, unspecified: Secondary | ICD-10-CM

## 2022-08-21 DIAGNOSIS — F32A Depression, unspecified: Secondary | ICD-10-CM

## 2022-08-22 NOTE — Telephone Encounter (Signed)
Pt mom signed paper. Paper at color folder.

## 2022-08-22 NOTE — Telephone Encounter (Signed)
[employer] called in about a referral they received. They need additional info regarding that referral and pt. Call was transferred to [facility].

## 2022-08-23 NOTE — Telephone Encounter (Signed)
Noted and faxed to medical records.

## 2022-09-24 ENCOUNTER — Ambulatory Visit (INDEPENDENT_AMBULATORY_CARE_PROVIDER_SITE_OTHER): Payer: 59 | Admitting: Internal Medicine

## 2022-09-24 ENCOUNTER — Encounter: Payer: Self-pay | Admitting: Internal Medicine

## 2022-09-24 ENCOUNTER — Ambulatory Visit (INDEPENDENT_AMBULATORY_CARE_PROVIDER_SITE_OTHER): Payer: 59

## 2022-09-24 VITALS — BP 98/68 | HR 91 | Temp 97.5°F | Ht 66.0 in | Wt 121.8 lb

## 2022-09-24 DIAGNOSIS — R451 Restlessness and agitation: Secondary | ICD-10-CM

## 2022-09-24 DIAGNOSIS — J189 Pneumonia, unspecified organism: Secondary | ICD-10-CM

## 2022-09-24 DIAGNOSIS — L03011 Cellulitis of right finger: Secondary | ICD-10-CM | POA: Diagnosis not present

## 2022-09-24 DIAGNOSIS — F419 Anxiety disorder, unspecified: Secondary | ICD-10-CM | POA: Diagnosis not present

## 2022-09-24 DIAGNOSIS — F32A Depression, unspecified: Secondary | ICD-10-CM

## 2022-09-24 DIAGNOSIS — K089 Disorder of teeth and supporting structures, unspecified: Secondary | ICD-10-CM

## 2022-09-24 DIAGNOSIS — R7989 Other specified abnormal findings of blood chemistry: Secondary | ICD-10-CM

## 2022-09-24 HISTORY — DX: Cellulitis of right finger: L03.011

## 2022-09-24 MED ORDER — PAROXETINE HCL 30 MG PO TABS: 30.0000 mg | ORAL_TABLET | Freq: Two times a day (BID) | ORAL | 1 refills | Status: DC

## 2022-09-24 MED ORDER — CEPHALEXIN 500 MG PO CAPS
500.0000 mg | ORAL_CAPSULE | Freq: Four times a day (QID) | ORAL | 0 refills | Status: DC
Start: 2022-09-24 — End: 2022-11-14

## 2022-09-24 MED ORDER — TRAZODONE HCL 50 MG PO TABS: 25.0000 mg | ORAL_TABLET | Freq: Every day | ORAL | 1 refills | Status: DC

## 2022-09-24 MED ORDER — DIAZEPAM 5 MG PO TABS: 5.0000 mg | ORAL_TABLET | Freq: Every day | ORAL | 2 refills | Status: DC | PRN

## 2022-09-24 NOTE — Progress Notes (Signed)
Subjective:  Patient ID: Lindsey [name], female    DOB: [birth year]  Age: 37 y.o. MRN: [id number]  CC: The primary encounter diagnosis was Paronychia of right middle finger. Diagnoses of Anxiety and depression, Restlessness and agitation, Pneumonia of left lower lobe due to infectious organism, Poor dentition, and Low serum cortisol level were also pertinent to this visit.   HPI Lindsey [name] presents for  Chief Complaint  Patient presents with   Medical Management of Chronic Issues   1) GAD:  seen one month ago with reports of increased episodes of agitation.  Still having   frequent (not daily) meltdowns with crying,  behavioral changes,  aggressive behavior  becomes uncontrollable,  pulls at the hair on the right occiput . Triggers are random.  Sometimes she says " I see ghosts" Taking 30 mg paxil . She has an appt  with  [place] partners   this Wednesday 1 pm  with "the best"   2) History of PNA: reviewed January illness and chest x ay . Clinically  resolved   Outpatient Medications Prior to Visit  Medication Sig Dispense Refill   ALPRAZolam (XANAX) 0.25 MG tablet 0.5 tablet in morning,  1 tablet in evening 45 tablet 2   chlorhexidine (PERIDEX) 0.12 % solution Use as directed 15 mLs in the mouth or throat 2 (two) times daily. 120 mL 1   diazepam (DIASTAT ACUDIAL) 10 MG GEL Place 20 mg rectally once.     diazepam (VALIUM) 2 MG tablet 1 tablet in the am,  2 tablets in the PM 90 tablet 5   EPINEPHrine 0.3 mg/0.3 mL IJ SOAJ injection Inject 0.3 mg into the muscle as needed for anaphylaxis. 1 each 1   LAMICTAL 100 MG tablet Take 100 mg by mouth 2 (two) times daily.      levETIRAcetam (KEPPRA) 750 MG tablet Take 3 tablets (2,250 mg total) by mouth daily. TAKE 1 and a half pills twice a day 90 tablet 0   melatonin 5 MG TABS Take 5 mg by mouth at bedtime.     Melatonin-Pyridoxine 5-1 MG TABS Take by mouth.     QUEtiapine (SEROQUEL) 25 MG tablet One tablet in morning,  2 in evening  270 tablet 1   PARoxetine (PAXIL) 30 MG tablet Take 1 tablet (30 mg total) by mouth 2 (two) times daily. 180 tablet 1   traZODone (DESYREL) 50 MG tablet Take 0.5 tablets (25 mg total) by mouth at bedtime. 90 tablet 1   azithromycin (ZITHROMAX) 500 MG tablet Take 1 tablet (500 mg total) by mouth daily. (Patient not taking: Reported on 09/24/2022) 7 tablet 0   predniSONE (DELTASONE) 10 MG tablet 6 tablets on Day 1 , then reduce by 1 tablet daily until gone (Patient not taking: Reported on 09/24/2022) 21 tablet 0   No facility-administered medications prior to visit.    Review of Systems;  Patient denies headache, fevers, malaise, unintentional weight loss, skin rash, eye pain, sinus congestion and sinus pain, sore throat, dysphagia,  hemoptysis , cough, dyspnea, wheezing, chest pain, palpitations, orthopnea, edema, abdominal pain, nausea, melena, diarrhea, constipation, flank pain, dysuria, hematuria, urinary  Frequency, nocturia, numbness, tingling, seizures,  Focal weakness, Loss of consciousness,  Tremor, insomnia, depression, anxiety, and suicidal ideation.      Objective:  BP 98/68   Pulse 91   Temp (!) 97.5 F (36.4 C) (Oral)   Ht 5\' 6"  (1.676 m)   Wt 121 lb 12.8 oz (55.2 kg)  LMP 01/09/2011   SpO2 98%   BMI 19.66 kg/m   BP Readings from Last 3 Encounters:  09/24/22 98/68  08/09/22 110/80  07/26/22 99/81    Wt Readings from Last 3 Encounters:  09/24/22 121 lb 12.8 oz (55.2 kg)  08/09/22 113 lb 9.6 oz (51.5 kg)  05/30/22 122 lb 9.6 oz (55.6 kg)    Physical Exam Vitals reviewed.  Constitutional:      General: She is not in acute distress.    Appearance: Normal appearance. She is normal weight. She is not ill-appearing, toxic-appearing or diaphoretic.  HENT:     Head: Normocephalic.  Eyes:     General: No scleral icterus.       Right eye: No discharge.        Left eye: No discharge.     Conjunctiva/sclera: Conjunctivae normal.  Cardiovascular:     Rate and Rhythm:  Normal rate and regular rhythm.     Heart sounds: Normal heart sounds.  Pulmonary:     Effort: Pulmonary effort is normal. No respiratory distress.     Breath sounds: Normal breath sounds.  Musculoskeletal:        General: Normal range of motion.  Skin:    General: Skin is warm and dry.     Findings: Erythema present.     Comments: Right middle finger with erythematous medial cuticle extending toward PIP  Neurological:     General: No focal deficit present.     Mental Status: She is alert and oriented to person, place, and time. Mental status is at baseline.  Psychiatric:        Mood and Affect: Mood normal.        Behavior: Behavior normal.        Thought Content: Thought content normal.        Judgment: Judgment normal.    Lab Results  Component Value Date   HGBA1C 4.4 (L) 08/09/2022   HGBA1C 4.7 03/19/2022   HGBA1C 4.9 03/16/2021    Lab Results  Component Value Date   CREATININE 0.64 08/09/2022   CREATININE 0.55 05/30/2022   CREATININE 0.45 05/09/2022    Lab Results  Component Value Date   WBC 3.2 (L) 08/09/2022   HGB 13.8 08/09/2022   HCT 41.0 08/09/2022   PLT 206.0 08/09/2022   GLUCOSE 84 08/09/2022   CHOL 129 03/19/2022   TRIG 78.0 03/19/2022   HDL 32.30 (L) 03/19/2022   LDLCALC 81 03/19/2022   ALT 9 08/09/2022   AST 18 08/09/2022   NA 137 08/09/2022   K 3.9 08/09/2022   CL 99 08/09/2022   CREATININE 0.64 08/09/2022   BUN 6 08/09/2022   CO2 28 08/09/2022   TSH 1.16 08/09/2022   INR 1.2 05/08/2022   HGBA1C 4.4 (L) 08/09/2022    No results found.  Assessment & Plan:  .Paronychia of right middle finger Assessment & Plan: Cephalexin prescribed    Anxiety and depression Assessment & Plan: Continue 30 mg paxil pending evaluation by [place] ,  adding 5 mg valium for use when agitated    Restlessness and agitation Assessment & Plan: Unclear if her visual hallucinations ("I see ghosts") is a seizure.  Recommend treating with valium 5 mg prn     Pneumonia of left lower lobe due to infectious organism Assessment & Plan: Repeat chest x ray Is needed to confirm resolution   Orders: -     DG Chest 2 View; Future  Poor dentition Assessment & Plan: Secondary to  poor diet and limited intellectual abilities.  Continue use of antimicrobial rinse    Low serum cortisol level Assessment & Plan: Repeat level is normal and lytes are normal.    Other orders -     PARoxetine HCl; Take 1 tablet (30 mg total) by mouth 2 (two) times daily.  Dispense: 180 tablet; Refill: 1 -     traZODone HCl; Take 0.5 tablets (25 mg total) by mouth at bedtime.  Dispense: 90 tablet; Refill: 1 -     diazePAM; Take 1 tablet (5 mg total) by mouth daily as needed for anxiety (agitation).  Dispense: 30 tablet; Refill: 2 -     Cephalexin; Take 1 capsule (500 mg total) by mouth 4 (four) times daily.  Dispense: 28 capsule; Refill: 0    Follow-up: No follow-ups on file.   Sherlene Shams, MD

## 2022-09-24 NOTE — Assessment & Plan Note (Signed)
Repeat chest x ray Is needed to confirm resolution

## 2022-09-24 NOTE — Patient Instructions (Signed)
I have refilled the valium 5 mg dose for episodes of severe agitation  Please give Lindsey [name] the antibiotic  4 times daily for one week to treat the paronychia

## 2022-09-24 NOTE — Assessment & Plan Note (Addendum)
Continue 30 mg paxil pending evaluation by [place] ,  adding 5 mg valium for use when agitated

## 2022-09-24 NOTE — Assessment & Plan Note (Signed)
Unclear if her visual hallucinations ("I see ghosts") is a seizure.  Recommend treating with valium 5 mg prn

## 2022-09-24 NOTE — Assessment & Plan Note (Signed)
Secondary to poor diet and limited intellectual abilities.  Continue use of antimicrobial rinse

## 2022-09-24 NOTE — Assessment & Plan Note (Signed)
Repeat level is normal and lytes are normal.  

## 2022-09-24 NOTE — Assessment & Plan Note (Signed)
Cephalexin prescribed

## 2022-10-08 ENCOUNTER — Telehealth: Payer: Self-pay | Admitting: Internal Medicine

## 2022-10-08 NOTE — Telephone Encounter (Signed)
Patient's mother called and would like patient's most recent labs fax to [facility], Fax # [phone] attn to [facility].

## 2022-10-08 NOTE — Telephone Encounter (Signed)
faxed

## 2022-10-13 ENCOUNTER — Encounter: Payer: Self-pay | Admitting: Emergency Medicine

## 2022-10-13 ENCOUNTER — Emergency Department: Payer: 59

## 2022-10-13 ENCOUNTER — Other Ambulatory Visit: Payer: Self-pay

## 2022-10-13 DIAGNOSIS — S0990XA Unspecified injury of head, initial encounter: Secondary | ICD-10-CM | POA: Diagnosis present

## 2022-10-13 DIAGNOSIS — W01198A Fall on same level from slipping, tripping and stumbling with subsequent striking against other object, initial encounter: Secondary | ICD-10-CM | POA: Insufficient documentation

## 2022-10-13 NOTE — ED Triage Notes (Signed)
Pt presents to triage via POV with complaints of fall tonight, pt hit her head on the bed frame. No LOC, no vomiting. Pt with developmental delay and is tearful in triage.

## 2022-10-14 ENCOUNTER — Emergency Department
Admission: EM | Admit: 2022-10-14 | Discharge: 2022-10-14 | Disposition: A | Discharge status: Home / Self Care | Payer: 59 | Attending: Emergency Medicine | Admitting: Emergency Medicine

## 2022-10-14 DIAGNOSIS — W19XXXA Unspecified fall, initial encounter: Secondary | ICD-10-CM

## 2022-10-14 DIAGNOSIS — S0990XA Unspecified injury of head, initial encounter: Secondary | ICD-10-CM

## 2022-10-14 NOTE — ED Provider Notes (Signed)
[facility] Provider Note    Event Date/Time   First MD Initiated Contact with Patient 10/14/22 0037     (approximate)   History   Fall   HPI  Lindsey [name] is a 37 y.o. female who presents to the ED for evaluation of Fall   Patient with a history of cerebral palsy, seizure disorder.  Lives at home with mother and grandmother.  They report that patient had an accidental fall this evening, falling backwards and striking her head on a baseboard.  No apparent PE or seizure activity.  Patient has had some medication changes in the past week or so and has been more agitated and not sleeping as much at night for the past 1+ weeks.  But no other falls, seizure activity or concerns.   Physical Exam   Triage Vital Signs: ED Triage Vitals  Enc Vitals Group     BP 10/13/22 2317 (!) 151/91     Pulse Rate 10/13/22 2317 (S) (!) 112     Resp 10/13/22 2317 (!) 22     Temp 10/13/22 2317 97.6 F (36.4 C)     Temp Source 10/13/22 2317 Oral     SpO2 10/13/22 2317 98 %     Weight 10/13/22 2316 120 lb (54.4 kg)     Height 10/13/22 2316 5\' 6"  (1.676 m)     Head Circumference --      Peak Flow --      Pain Score --      Pain Loc --      Pain Edu? --      Excl. in GC? --     Most recent vital signs: Vitals:   10/13/22 2317  BP: (!) 151/91  Pulse: (S) (!) 112  Resp: (!) 22  Temp: 97.6 F (36.4 C)  SpO2: 98%    General: Awake, no distress.  CV:  Good peripheral perfusion.  Resp:  Normal effort.  Abd:  No distention.  MSK:  No deformity noted.  Neuro:  No focal deficits appreciated. Other:  No laceration or palpable hematoma.  No step-off to suggest skull fracture.  Fully ranging her neck without signs of cervical midline tenderness.   ED Results / Procedures / Treatments   Labs (all labs ordered are listed, but only abnormal results are displayed) Labs Reviewed - No data to display  EKG   RADIOLOGY CT head interpreted by me without evidence  of acute intracranial pathology CT cervical spine interpreted by me without evidence of fracture or dislocation  Official radiology report(s): CT Cervical Spine Wo Contrast  Result Date: 10/13/2022 CLINICAL DATA:  Status post fall. EXAM: CT CERVICAL SPINE WITHOUT CONTRAST TECHNIQUE: Multidetector CT imaging of the cervical spine was performed without intravenous contrast. Multiplanar CT image reconstructions were also generated. RADIATION DOSE REDUCTION: This exam was performed according to the departmental dose-optimization program which includes automated exposure control, adjustment of the mA and/or kV according to patient size and/or use of iterative reconstruction technique. COMPARISON:  April 13, 2022 FINDINGS: Alignment: Normal. Skull base and vertebrae: No acute fracture. No primary bone lesion or focal pathologic process. Soft tissues and spinal canal: No prevertebral fluid or swelling. No visible canal hematoma. Disc levels: Normal multilevel endplates are seen with normal multilevel intervertebral disc spaces. Mild, bilateral multilevel facet joint hypertrophy is noted. Upper chest: Negative. Other: None. IMPRESSION: 1. Mild multilevel degenerative changes. 2. No acute cervical spine fracture or subluxation. Electronically Signed   By: Waylan Rocher  Houston M.D.   On: 10/13/2022 23:59   CT HEAD WO CONTRAST ( )  Result Date: 10/13/2022 CLINICAL DATA:  Status post trauma. EXAM: CT HEAD WITHOUT CONTRAST TECHNIQUE: Contiguous axial images were obtained from the base of the skull through the vertex without intravenous contrast. RADIATION DOSE REDUCTION: This exam was performed according to the departmental dose-optimization program which includes automated exposure control, adjustment of the mA and/or kV according to patient size and/or use of iterative reconstruction technique. COMPARISON:  April 13, 2022 FINDINGS: Brain: No evidence of acute infarction, hemorrhage, hydrocephalus, extra-axial  collection or mass lesion/mass effect. A large area of encephalomalacia is seen throughout the right temporal lobe. Vascular: No hyperdense vessel or unexpected calcification. Skull: A right temporal craniotomy defect is seen. Sinuses/Orbits: No acute finding. Other: None. IMPRESSION: 1. No acute intracranial abnormality. 2. Large area of right temporal lobe encephalomalacia. 3. Right temporal craniotomy defect. Electronically Signed   By: Aram Candela M.D.   On: 10/13/2022 23:56    PROCEDURES and INTERVENTIONS:  Procedures  Medications - No data to display   IMPRESSION / MDM / ASSESSMENT AND PLAN / ED COURSE  I reviewed the triage vital signs and the nursing notes.  Differential diagnosis includes, but is not limited to, seizure, cardiac dysrhythmia, vasovagal episode, ICH, skull fracture, cervical fracture  {Patient presents with symptoms of an acute illness or injury that is potentially life-threatening.  Patient presents from home after a fall without evidence of acute pathology and suitable for outpatient management.  Reassuring exam without signs of neurologic deficits, open injury, laceration or significant derangements.  Imaging of head and neck are reassuring, as above.  Suitable for outpatient management.  Discussed return precautions.      FINAL CLINICAL IMPRESSION(S) / ED DIAGNOSES   Final diagnoses:  Fall, initial encounter  Injury of head, initial encounter     Rx / DC Orders   ED Discharge Orders     None        Note:  This document was prepared using Dragon voice recognition software and may include unintentional dictation errors.   Delton Prairie, MD 10/14/22 [phone]

## 2022-10-16 ENCOUNTER — Telehealth: Payer: Self-pay

## 2022-10-16 NOTE — Transitions of Care (Post Inpatient/ED Visit) (Signed)
Unable to reach [employer] pts mom (DPR signed) and left v/m requesting [name] to call [phone].      10/16/2022  Name: Lindsey [name] MRN: [id number] DOB: 1985/05/13  Today's TOC FU Call Status: Today's TOC FU Call Status:: Unsuccessul Call (1st Attempt) Unsuccessful Call (1st Attempt) Date: 10/16/22  Attempted to reach the patient regarding the most recent Inpatient/ED visit.  Follow Up Plan: Additional outreach attempts will be made to reach the patient to complete the Transitions of Care (Post Inpatient/ED visit) call.   Signature Lewanda Rife, LPN

## 2022-10-17 NOTE — Telephone Encounter (Signed)
Sending note to Dr Darrick Huntsman for review.

## 2022-10-17 NOTE — Transitions of Care (Post Inpatient/ED Visit) (Signed)
Unable to reach [place] by phone and left v/m requesting cb [phone].      10/17/2022  Name: Lindsey [name] MRN: [id number] DOB: 30-Dec-1985  Today's TOC FU Call Status: Today's TOC FU Call Status:: Unsuccessful Call (2nd Attempt) Unsuccessful Call (1st Attempt) Date: 10/16/22 Unsuccessful Call (2nd Attempt) Date: 10/17/22  Attempted to reach the patient regarding the most recent Inpatient/ED visit.  Follow Up Plan: Additional outreach attempts will be made to reach the patient to complete the Transitions of Care (Post Inpatient/ED visit) call.   Signature Lewanda Rife, LPN

## 2022-10-17 NOTE — Telephone Encounter (Signed)
Patient mom called in and stated that they are doing fine and you don't have to call back. Thank  you!

## 2022-10-22 ENCOUNTER — Other Ambulatory Visit: Payer: Self-pay | Admitting: Internal Medicine

## 2022-11-12 ENCOUNTER — Other Ambulatory Visit: Payer: Self-pay | Admitting: Internal Medicine

## 2022-11-12 NOTE — Telephone Encounter (Signed)
Refilled: 08/09/2022 Last OV: 09/24/2022 Next OV: 04/24/2023

## 2022-11-14 ENCOUNTER — Ambulatory Visit (INDEPENDENT_AMBULATORY_CARE_PROVIDER_SITE_OTHER): Payer: 59 | Admitting: Internal Medicine

## 2022-11-14 VITALS — BP 114/72 | HR 85 | Temp 97.3°F | Ht 65.0 in | Wt 138.2 lb

## 2022-11-14 DIAGNOSIS — R451 Restlessness and agitation: Secondary | ICD-10-CM | POA: Diagnosis not present

## 2022-11-14 DIAGNOSIS — L740 Miliaria rubra: Secondary | ICD-10-CM

## 2022-11-14 DIAGNOSIS — L600 Ingrowing nail: Secondary | ICD-10-CM | POA: Diagnosis not present

## 2022-11-14 MED ORDER — CEPHALEXIN 500 MG PO CAPS: 500.0000 mg | ORAL_CAPSULE | Freq: Four times a day (QID) | ORAL | 0 refills | Status: AC

## 2022-11-14 NOTE — Patient Instructions (Addendum)
Lydian's rash looks like a heat rash. Try using Lloyd Huger Bond's medicated powder with zinc  For the ingrown toenail:   Soak the toenail in salt water for the swelling.  Let the nail grow out and cut STRAIGHT ACROSS in the future   I have placed an antibiotic  (cephalexin ) on file at [facility] in case she develops a cuticle infection

## 2022-11-14 NOTE — Assessment & Plan Note (Signed)
Now seeing psychiatrist at [facility].  Seroquel dose changed to 300 mg at bedtime.   Trazodone stopped.  Did not tolerate trial of Nuedexta which caused excessive crying and agitation and resulted in a fall at home requiring ER evaluation with CT head and spine negative for trauma

## 2022-11-14 NOTE — Assessment & Plan Note (Signed)
Recurrent, limited to torso.(Posterior).  Godl bond medicated powder with zinc

## 2022-11-14 NOTE — Progress Notes (Signed)
Subjective:  Patient ID: Lindsey [name], female    DOB: 10-07-1985  Age: 37 y.o. MRN: [id number]  CC: The primary encounter diagnosis was Ingrown toenail without infection. Diagnoses of Restlessness and agitation and Heat rash were also pertinent to this visit.   HPI Lindsey [name] presents for  Chief Complaint  Patient presents with   Rash    On back   Ingrown Toenail    Right foot    1) Recent fall occurred after medication changes made by [facility] Starling Manns)  was taking Nuedexta and was having a temper tantrum  when the fall occurred C spine and CT brain done.     2) macular erythematous rash on posterior torso limited to back  .   non peuritic  appears to be heat rash.  As she is perspiring profusely in the heat.  No recent detergent changes . Back is sweaty   3) Agitation:  reviewed recent  medication changes  done by Starling Manns at [facility]:  trazodone stopped.  Seroquel changed to 300 mg at bedtime  . Paxil continued   Trial of Nudextra for crying was not tolerated (more intense crying)   has follow up tomorrow.   4) ingrown toenail,  righ great foot.  Not currently infected.  But medial cuticle is red and swollen   Outpatient Medications Prior to Visit  Medication Sig Dispense Refill   ALPRAZolam (XANAX) 0.25 MG tablet TAKE ONE-HALF TABLET BY MOUTH EVERY MORNING AND TAKE ONE TABLET IN THE EVENING 45 tablet 5   chlorhexidine (PERIDEX) 0.12 % solution Use as directed 15 mLs in the mouth or throat 2 (two) times daily. 120 mL 1   diazepam (DIASTAT ACUDIAL) 10 MG GEL Place 20 mg rectally once.     diazepam (VALIUM) 2 MG tablet TAKE 1 TABLET BY MOUTH EVERY MORNING AND2 TABLETS IN THE EVENING AS DIRECTED. 90 tablet 5   EPINEPHrine 0.3 mg/0.3 mL IJ SOAJ injection Inject 0.3 mg into the muscle as needed for anaphylaxis. 1 each 1   LAMICTAL 100 MG tablet Take 100 mg by mouth 2 (two) times daily.      levETIRAcetam (KEPPRA) 750 MG tablet Take 3 tablets  (2,250 mg total) by mouth daily. TAKE 1 and a half pills twice a day 90 tablet 0   melatonin 5 MG TABS Take 5 mg by mouth at bedtime.     PARoxetine (PAXIL) 30 MG tablet Take 1 tablet (30 mg total) by mouth 2 (two) times daily. 180 tablet 1   QUEtiapine (SEROQUEL) 300 MG tablet Take 300 mg by mouth at bedtime.     cephALEXin (KEFLEX) 500 MG capsule Take 1 capsule (500 mg total) by mouth 4 (four) times daily. 28 capsule 0   diazepam (VALIUM) 5 MG tablet Take 1 tablet (5 mg total) by mouth daily as needed for anxiety (agitation). 30 tablet 2   Melatonin-Pyridoxine 5-1 MG TABS Take by mouth.     QUEtiapine (SEROQUEL) 25 MG tablet One tablet in morning,  2 in evening 270 tablet 1   traZODone (DESYREL) 50 MG tablet Take 0.5 tablets (25 mg total) by mouth at bedtime. 90 tablet 1   No facility-administered medications prior to visit.    Review of Systems;  Patient denies headache, fevers, malaise, unintentional weight loss, skin rash, eye pain, sinus congestion and sinus pain, sore throat, dysphagia,  hemoptysis , cough, dyspnea, wheezing, chest pain, palpitations, orthopnea, edema, abdominal pain, nausea, melena, diarrhea,  constipation, flank pain, dysuria, hematuria, urinary  Frequency, nocturia, numbness, tingling, seizures,  Focal weakness, Loss of consciousness,  Tremor, insomnia, depression, anxiety, and suicidal ideation.      Objective:  BP 114/72 (BP Location: Left Arm, Patient Position: Sitting, Cuff Size: Normal)   Pulse 85   Temp (!) 97.3 F (36.3 C) (Temporal)   Ht 5\' 5"  (1.651 m)   Wt 138 lb 4 oz (62.7 kg)   LMP 01/09/2011   SpO2 97%   BMI 23.01 kg/m   BP Readings from Last 3 Encounters:  11/14/22 114/72  10/13/22 (!) 151/91  09/24/22 98/68    Wt Readings from Last 3 Encounters:  11/14/22 138 lb 4 oz (62.7 kg)  10/13/22 120 lb (54.4 kg)  09/24/22 121 lb 12.8 oz (55.2 kg)    Physical Exam Vitals reviewed.  Constitutional:      General: She is not in acute  distress.    Appearance: Normal appearance. She is normal weight. She is not ill-appearing, toxic-appearing or diaphoretic.  HENT:     Head: Normocephalic.  Eyes:     General: No scleral icterus.       Right eye: No discharge.        Left eye: No discharge.     Conjunctiva/sclera: Conjunctivae normal.  Cardiovascular:     Rate and Rhythm: Normal rate and regular rhythm.     Heart sounds: Normal heart sounds.  Pulmonary:     Effort: Pulmonary effort is normal. No respiratory distress.     Breath sounds: Normal breath sounds.  Musculoskeletal:        General: Normal range of motion.  Skin:    General: Skin is warm and dry.     Findings: Rash present. Rash is papular.          Comments: Petechial macular rash covering entire back,  covered in droplets of sweat   Neurological:     General: No focal deficit present.     Mental Status: She is alert and oriented to person, place, and time. Mental status is at baseline.  Psychiatric:        Mood and Affect: Mood normal.        Behavior: Behavior normal.        Thought Content: Thought content normal.        Judgment: Judgment normal.    Lab Results  Component Value Date   HGBA1C 4.4 (L) 08/09/2022   HGBA1C 4.7 03/19/2022   HGBA1C 4.9 03/16/2021    Lab Results  Component Value Date   CREATININE 0.64 08/09/2022   CREATININE 0.55 05/30/2022   CREATININE 0.45 05/09/2022    Lab Results  Component Value Date   WBC 3.2 (L) 08/09/2022   HGB 13.8 08/09/2022   HCT 41.0 08/09/2022   PLT 206.0 08/09/2022   GLUCOSE 84 08/09/2022   CHOL 129 03/19/2022   TRIG 78.0 03/19/2022   HDL 32.30 (L) 03/19/2022   LDLCALC 81 03/19/2022   ALT 9 08/09/2022   AST 18 08/09/2022   NA 137 08/09/2022   K 3.9 08/09/2022   CL 99 08/09/2022   CREATININE 0.64 08/09/2022   BUN 6 08/09/2022   CO2 28 08/09/2022   TSH 1.16 08/09/2022   INR 1.2 05/08/2022   HGBA1C 4.4 (L) 08/09/2022    No results found.  Assessment & Plan:  .Ingrown toenail  without infection Assessment & Plan: Secondary to improper nail trimming.  Supportive care outlined.  Cephalexin rx placed on file at [facility]  in the event the cuticle becomes infected.    Restlessness and agitation Assessment & Plan: Now seeing psychiatrist at [facility].  Seroquel dose changed to 300 mg at bedtime.   Trazodone stopped.  Did not tolerate trial of Nuedexta which caused excessive crying and agitation and resulted in a fall at home requiring ER evaluation with CT head and spine negative for trauma    Heat rash Assessment & Plan: Recurrent, limited to torso.(Posterior).  Godl bond medicated powder with zinc   Other orders -     Cephalexin; Take 1 capsule (500 mg total) by mouth 4 (four) times daily for 7 days.  Dispense: 28 capsule; Refill: 0     I provided 23 minutes of face-to-face time during this encounter reviewing patient's last visit with me, patient's  most recent visit with cneurology,  recent ER visit , previous  labs and imaging studies, counseling on currently addressed issues,  and post visit ordering to diagnostics and therapeutics .   Follow-up: No follow-ups on file.   Sherlene Shams, MD

## 2022-11-14 NOTE — Assessment & Plan Note (Signed)
Secondary to improper nail trimming.  Supportive care outlined.  Cephalexin rx placed on file at [facility] in the event the cuticle becomes infected.

## 2022-11-30 ENCOUNTER — Other Ambulatory Visit: Payer: Self-pay | Admitting: Internal Medicine

## 2022-11-30 MED ORDER — DIAZEPAM 2 MG PO TABS: ORAL_TABLET | ORAL | 5 refills | Status: DC

## 2022-12-18 ENCOUNTER — Emergency Department
Admission: EM | Admit: 2022-12-18 | Discharge: 2022-12-18 | Disposition: A | Discharge status: Home / Self Care | Payer: 59 | Attending: Emergency Medicine | Admitting: Emergency Medicine

## 2022-12-18 ENCOUNTER — Other Ambulatory Visit: Payer: Self-pay

## 2022-12-18 ENCOUNTER — Encounter: Payer: Self-pay | Admitting: Emergency Medicine

## 2022-12-18 ENCOUNTER — Emergency Department: Payer: 59

## 2022-12-18 DIAGNOSIS — W1830XA Fall on same level, unspecified, initial encounter: Secondary | ICD-10-CM | POA: Insufficient documentation

## 2022-12-18 DIAGNOSIS — S066X0A Traumatic subarachnoid hemorrhage without loss of consciousness, initial encounter: Secondary | ICD-10-CM | POA: Insufficient documentation

## 2022-12-18 DIAGNOSIS — S0083XA Contusion of other part of head, initial encounter: Secondary | ICD-10-CM | POA: Insufficient documentation

## 2022-12-18 DIAGNOSIS — I609 Nontraumatic subarachnoid hemorrhage, unspecified: Secondary | ICD-10-CM
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
## 2022-12-18 NOTE — Discharge Instructions (Addendum)
You may use Tylenol 1000 mg every 6 hours as needed for pain.  You may place ice to the face as needed and as tolerated to help with bruising and swelling.  CT scan showed a small area of bleeding in the right front of her brain.  We discussed this with Dr. Myer Haff on-call for neurosurgery.  He recommends that she just continue her seizure medication as prescribed but does not feel she needs neurologic or neurosurgical intervention or outpatient follow-up unless there are any concerning symptoms such as severe headaches, vomiting, changes in speech, numbness or weakness, increased seizure activity.  If she does develop any of these symptoms, please return to the emergency department.

## 2022-12-18 NOTE — ED Triage Notes (Signed)
Pt to triage via w/c with no distress noted, accomp by mother who reports last night pt "got upset and tried to push her off" and fell onto the carpet; pt with bruising to rt eye/rt temple; mom denies LOC, pt denies pain

## 2022-12-18 NOTE — ED Notes (Signed)
RN at bedside. Pt given discharge instructions. Family accompanied pt. Family removing cords prior to the RN getting discharge vitals. Pt refused vitals at this time. Pt and family verbalize understanding of discharge instructions.

## 2023-01-10 ENCOUNTER — Other Ambulatory Visit: Payer: Self-pay | Admitting: Internal Medicine

## 2023-01-17 ENCOUNTER — Ambulatory Visit (INDEPENDENT_AMBULATORY_CARE_PROVIDER_SITE_OTHER): Payer: 59 | Admitting: Nurse Practitioner

## 2023-01-17 ENCOUNTER — Encounter: Payer: Self-pay | Admitting: Nurse Practitioner

## 2023-01-17 ENCOUNTER — Telehealth: Payer: Self-pay | Admitting: Internal Medicine

## 2023-01-17 VITALS — BP 116/74 | HR 90 | Temp 97.8°F | Ht 66.0 in | Wt 155.8 lb

## 2023-01-17 DIAGNOSIS — R21 Rash and other nonspecific skin eruption: Secondary | ICD-10-CM | POA: Diagnosis not present

## 2023-01-17 DIAGNOSIS — H9201 Otalgia, right ear: Secondary | ICD-10-CM | POA: Diagnosis not present

## 2023-01-17 MED ORDER — NYSTATIN-TRIAMCINOLONE 100000-0.1 UNIT/GM-% EX OINT: 1.0000 | TOPICAL_OINTMENT | Freq: Two times a day (BID) | CUTANEOUS | 1 refills | Status: DC

## 2023-01-17 MED ORDER — ZINC OXIDE 20 % EX OINT: TOPICAL_OINTMENT | Freq: Two times a day (BID) | CUTANEOUS | 1 refills | Status: DC

## 2023-01-17 NOTE — Telephone Encounter (Signed)
Nystatin-triamcinolone cream sent to pharmacy.

## 2023-01-17 NOTE — Assessment & Plan Note (Addendum)
Cerumen impaction Bilaterally. Not able to visualize the TM.  Advised for cerumen lavage, Mom politely declines and states she will scheduled appointment with ENT Dr. Jenne Campus. Advised to use tylenol for symptomatic relief.

## 2023-01-17 NOTE — Telephone Encounter (Signed)
Marina from [facility] called stating they need more information on hydrocortisone 2.5%-nystatin-zinc oxide 20% 1:1:1 ointment mixture. They need to know the strength of the ointment

## 2023-01-17 NOTE — Patient Instructions (Addendum)
Rx sent to the pharmacy. Use the cream twice daily. Reminder to schedule f/u with ENT. Call the office if symptoms are not improving.

## 2023-01-17 NOTE — Progress Notes (Signed)
Established Patient Office Visit  Subjective:  Patient ID: Lindsey [name], female    DOB: 1986/01/22  Age: 37 y.o. MRN: [id number]  CC:  Chief Complaint  Patient presents with   Acute Visit    Rash under right arm x 2 weeks Itchy using Gold Bond Cream & Powder   Right Ear Pain since night of 01/16/23    HPI  Lindsey [name] presents for rash under the R arm pit for 2 weeks accompained by her mother. Mother states has been using gold bond cream and powder with out significant improvement. Mom reports she shaves pt's arm pit once every 3 weeks. Mom denies rash under the L arm pit.   Mom also reports that the patient complaint of R ear pain last night.   Denise any runny nose, sore throat or fever.  HPI   Past Medical History:  Diagnosis Date   COVID-19 12/2020   Epilepsy [facility]) age 28   negative metabolic workup has brain abnormalities by MRI (Dr. Quintin Alto, [facility])   Hypertropia of left eye    Insomnia    Intellectual disability    Partial epilepsy with impairment of consciousness, intractable (HCC)    Psychomotor agitation    Seizures (HCC)    None since approx 2018   Status post VNS (vagus nerve stimulator) placement 05/07/1998   Temporal lobectomy behavior syndrome 05/07/1992   Right   Torticollis, ocular     Past Surgical History:  Procedure Laterality Date   ABDOMINAL HYSTERECTOMY  2005   BRAIN SURGERY     Temporal Lobe resection   CRANIOTOMY FOR TEMPORAL LOBECTOMY     TONSILLECTOMY AND ADENOIDECTOMY  age 30    History reviewed. No pertinent family history.  Social History   Socioeconomic History   Marital status: Single    Spouse name: Not on file   Number of children: Not on file   Years of education: Not on file   Highest education level: 12th grade  Occupational History   Not on file  Tobacco Use   Smoking status: Never   Smokeless tobacco: Never  Vaping Use   Vaping status: Never Used  Substance and Sexual Activity   Alcohol use: No     Alcohol/week: 0.0 standard drinks of alcohol   Drug use: No   Sexual activity: Not on file  Other Topics Concern   Not on file  Social History Narrative   Lives with mother   Social Determinants of Health   Financial Resource Strain: Low Risk  (11/14/2022)   Overall Financial Resource Strain (CARDIA)    Difficulty of Paying Living Expenses: Not hard at all  Food Insecurity: No Food Insecurity (11/14/2022)   Hunger Vital Sign    Worried About Running Out of Food in the Last Year: Never true    Ran Out of Food in the Last Year: Never true  Transportation Needs: No Transportation Needs (11/14/2022)   PRAPARE - Administrator, Civil Service (Medical): No    Lack of Transportation (Non-Medical): No  Physical Activity: Unknown (11/14/2022)   Exercise Vital Sign    Days of Exercise per Week: 0 days    Minutes of Exercise per Session: Not on file  Stress: Stress Concern Present (11/14/2022)   [employer] of Occupational Health - Occupational Stress Questionnaire    Feeling of Stress : To some extent  Social Connections: Moderately Integrated (11/14/2022)   Social Connection and Isolation Panel [NHANES]    Frequency of  Communication with Friends and Family: More than three times a week    Frequency of Social Gatherings with Friends and Family: More than three times a week    Attends Religious Services: More than 4 times per year    Active Member of [employer] or Organizations: Yes    Attends Engineer, structural: More than 4 times per year    Marital Status: Never married  Intimate Partner Violence: Not At Risk (05/07/2022)   Humiliation, Afraid, Rape, and Kick questionnaire    Fear of Current or Ex-Partner: No    Emotionally Abused: No    Physically Abused: No    Sexually Abused: No     Outpatient Medications Prior to Visit  Medication Sig Dispense Refill   chlorhexidine (PERIDEX) 0.12 % solution Use as directed 15 mLs in the mouth or throat 2 (two) times daily.  120 mL 1   diazepam (DIASTAT ACUDIAL) 10 MG GEL Place 20 mg rectally once.     diazepam (VALIUM) 2 MG tablet TAKE 1 TABLET BY MOUTH EVERY MORNING AND  3 TABLETS IN THE EVENING AS DIRECTED. 120 tablet 5   EPINEPHrine 0.3 mg/0.3 mL IJ SOAJ injection INJECT 0.3 MG INTRAMUSCULARLY AS NEEDED FOR ANAPHYLAXIS 2 each 0   LAMICTAL 100 MG tablet Take 100 mg by mouth 2 (two) times daily.      levETIRAcetam (KEPPRA) 750 MG tablet Take 3 tablets (2,250 mg total) by mouth daily. TAKE 1 and a half pills twice a day 90 tablet 0   melatonin 5 MG TABS Take 5 mg by mouth at bedtime.     PARoxetine (PAXIL) 30 MG tablet Take 1 tablet (30 mg total) by mouth 2 (two) times daily. 180 tablet 1   QUEtiapine (SEROQUEL) 300 MG tablet Take 300 mg by mouth at bedtime.     No facility-administered medications prior to visit.    Allergies  Allergen Reactions   Other Swelling     Allergic Reaction to all nuts/ingredients   Influenza Vac Split Quad Other (See Comments)    48 hours of fever, myalgias    Benadryl [Diphenhydramine Hcl]     Seizure   Montelukast Swelling   Ropinirole Swelling    Itching,    Zyrtec [Cetirizine Hcl]     Seizure    Peanut-Containing Drug Products Rash    ROS Review of Systems Negative unless indicated in HPI.    Objective:    Physical Exam Constitutional:      Appearance: Normal appearance.  HENT:     Right Ear: There is impacted cerumen.     Left Ear: There is impacted cerumen.  Cardiovascular:     Rate and Rhythm: Normal rate and regular rhythm.     Pulses: Normal pulses.     Heart sounds: Normal heart sounds.  Musculoskeletal:     Cervical back: Normal range of motion.  Skin:    Findings: Rash present. Rash is macular.     Comments: Scattered slightly erythematic macular rash to R armpit.   Neurological:     General: No focal deficit present.     Mental Status: She is alert. Mental status is at baseline.  Psychiatric:        Mood and Affect: Mood normal.         Behavior: Behavior normal.        Thought Content: Thought content normal.        Judgment: Judgment normal.     BP 116/74   Pulse 90  Temp 97.8 F (36.6 C)   Ht 5\' 6"  (1.676 m)   Wt 155 lb 12.8 oz (70.7 kg)   LMP 01/09/2011   SpO2 98%   BMI 25.15 kg/m  Wt Readings from Last 3 Encounters:  01/17/23 155 lb 12.8 oz (70.7 kg)  12/18/22 175 lb (79.4 kg)  11/14/22 138 lb 4 oz (62.7 kg)     Health Maintenance  Topic Date Due   DTaP/Tdap/Td (1 - Tdap) Never done   PAP SMEAR-Modifier  Never done   COVID-19 Vaccine (4 - 2023-24 season) 02/02/2023 (Originally 01/06/2023)   INFLUENZA VACCINE  08/05/2023 (Originally 12/06/2022)   Hepatitis C Screening  Completed   HIV Screening  Completed   HPV VACCINES  Aged Out    There are no preventive care reminders to display for this patient.  Lab Results  Component Value Date   TSH 1.16 08/09/2022   Lab Results  Component Value Date   WBC 3.2 (L) 08/09/2022   HGB 13.8 08/09/2022   HCT 41.0 08/09/2022   MCV 87.6 08/09/2022   PLT 206.0 08/09/2022   Lab Results  Component Value Date   NA 137 08/09/2022   K 3.9 08/09/2022   CO2 28 08/09/2022   GLUCOSE 84 08/09/2022   BUN 6 08/09/2022   CREATININE 0.64 08/09/2022   BILITOT 0.4 08/09/2022   ALKPHOS 100 08/09/2022   AST 18 08/09/2022   ALT 9 08/09/2022   PROT 7.0 08/09/2022   ALBUMIN 4.1 08/09/2022   CALCIUM 9.3 08/09/2022   ANIONGAP 6 05/09/2022   GFR 114.81 08/09/2022   Lab Results  Component Value Date   CHOL 129 03/19/2022   Lab Results  Component Value Date   HDL 32.30 (L) 03/19/2022   Lab Results  Component Value Date   LDLCALC 81 03/19/2022   Lab Results  Component Value Date   TRIG 78.0 03/19/2022   Lab Results  Component Value Date   CHOLHDL 4 03/19/2022   Lab Results  Component Value Date   HGBA1C 4.4 (L) 08/09/2022      Assessment & Plan:  Rash  Otalgia of right ear Assessment & Plan: Cerumen impaction Bilaterally. Not able to visualize the  TM.  Advised for cerumen lavage, Mom politely declines and states she will scheduled appointment with ENT Dr. Jenne Campus. Advised to use tylenol for symptomatic relief.    Other orders -     hydrocortisone 2.5%-nystatin-zinc oxide 20% 1:1:1 ointment mixture; Apply topically 2 (two) times daily.  Dispense: 240 g; Refill: 1    Follow-up: Return if symptoms worsen or fail to improve.   Kara Dies, NP

## 2023-01-17 NOTE — Addendum Note (Signed)
Addended by: Kara Dies on: 01/17/2023 03:07 PM   Modules accepted: Orders

## 2023-01-17 NOTE — Telephone Encounter (Signed)
Called Patient to let her know that the medication has been sent in.

## 2023-02-15 ENCOUNTER — Other Ambulatory Visit: Payer: Self-pay | Admitting: Unknown Physician Specialty

## 2023-02-15 ENCOUNTER — Encounter: Payer: Self-pay | Admitting: Unknown Physician Specialty

## 2023-02-21 ENCOUNTER — Ambulatory Visit: Payer: 59

## 2023-02-21 ENCOUNTER — Ambulatory Visit: Payer: 59 | Admitting: Family

## 2023-02-21 VITALS — BP 130/72 | HR 96 | Temp 98.1°F | Ht 66.0 in | Wt 158.0 lb

## 2023-02-21 DIAGNOSIS — J029 Acute pharyngitis, unspecified: Secondary | ICD-10-CM | POA: Diagnosis not present

## 2023-02-21 MED ORDER — AMOXICILLIN 500 MG PO TABS
500.0000 mg | ORAL_TABLET | Freq: Two times a day (BID) | ORAL | 0 refills | Status: AC
Start: 2023-02-21 — End: 2023-03-03

## 2023-02-21 NOTE — Patient Instructions (Signed)
As discussed, I suspect pharyngitis may be more viral or allergic in etiology based on duration of symptoms.  If symptoms persist tomorrow, I think it is reasonable to start amoxicillin.  Please ensure you complete antibiotic course.  Please ensure that Sophea is eating yogurt daily as amoxicillin can cause diarrhea and diarrheal infections.  Very nice seeing you today!

## 2023-02-21 NOTE — Assessment & Plan Note (Signed)
Afebrile.  Duration of 1 day.  Discussed with mom that pharyngitis may be viral and/or allergic in etiology.  We jointly agreed that I would send in amoxicillin to cover for strep pharyngitis as patient politely declined strep testing /swabbing today.  I advised to start amoxicillin and treat empirically for strep pharyngitis if symptoms were to persist tomorrow however if symptoms were improved in the morning, I would more suspect viral pharyngitis and conservative therapy would be warranted.  Encouraged salt water gargles, honey.  Mom verbalized understanding.

## 2023-02-21 NOTE — Progress Notes (Signed)
Assessment & Plan:  Pharyngitis, unspecified etiology Assessment & Plan: Afebrile.  Duration of 1 day.  Discussed with mom that pharyngitis may be viral and/or allergic in etiology.  We jointly agreed that I would send in amoxicillin to cover for strep pharyngitis as patient politely declined strep testing /swabbing today.  I advised to start amoxicillin and treat empirically for strep pharyngitis if symptoms were to persist tomorrow however if symptoms were improved in the morning, I would more suspect viral pharyngitis and conservative therapy would be warranted.  Encouraged salt water gargles, honey.  Mom verbalized understanding.  Orders: -     Amoxicillin; Take 1 tablet (500 mg total) by mouth 2 (two) times daily for 10 days.  Dispense: 20 tablet; Refill: 0     Return precautions given.   Risks, benefits, and alternatives of the medications and treatment plan prescribed today were discussed, and patient expressed understanding.   Education regarding symptom management and diagnosis given to patient on AVS either electronically or printed.  No follow-ups on file.  Rennie Plowman, FNP  Subjective:    Patient ID: Lindsey [name], female    DOB: 1985-05-21, 37 y.o.   MRN: [id number]  CC: Lindsey [name] is a 37 y.o. female who presents today for an acute visit.    HPI: Accompanied by her mom, who is primary historian.  Mom states that sore throat started today. Dry cough more this morning.    Mom noticed that she did not eat her cheeseburger.  She is drinking liquids. Tmax 101.1 this morning.  She is given her 1 dose of ibuprofen  No chills, sob, wheezing  History of tonsillectomy  Allergies: Other, Influenza vac split quad, Benadryl [diphenhydramine hcl], Montelukast, Ropinirole, Zyrtec [cetirizine hcl], and Peanut-containing drug products Current Outpatient Medications on File Prior to Visit  Medication Sig Dispense Refill   chlorhexidine (PERIDEX) 0.12 % solution  Use as directed 15 mLs in the mouth or throat 2 (two) times daily. 120 mL 1   diazepam (DIASTAT ACUDIAL) 10 MG GEL Place 20 mg rectally once.     diazepam (VALIUM) 2 MG tablet TAKE 1 TABLET BY MOUTH EVERY MORNING AND  3 TABLETS IN THE EVENING AS DIRECTED. 120 tablet 5   EPINEPHrine 0.3 mg/0.3 mL IJ SOAJ injection INJECT 0.3 MG INTRAMUSCULARLY AS NEEDED FOR ANAPHYLAXIS 2 each 0   LAMICTAL 100 MG tablet Take 100 mg by mouth 2 (two) times daily.      levETIRAcetam (KEPPRA) 750 MG tablet Take 3 tablets (2,250 mg total) by mouth daily. TAKE 1 and a half pills twice a day 90 tablet 0   melatonin 5 MG TABS Take 5 mg by mouth at bedtime.     PARoxetine (PAXIL) 30 MG tablet Take 1 tablet (30 mg total) by mouth 2 (two) times daily. 180 tablet 1   QUEtiapine (SEROQUEL) 300 MG tablet Take 300 mg by mouth at bedtime.     hydrocortisone 2.5%-nystatin-zinc oxide 20% 1:1:1 ointment mixture Apply topically 2 (two) times daily. 240 g 1   nystatin-triamcinolone ointment (MYCOLOG) Apply 1 Application topically 2 (two) times daily. 30 g 1   No current facility-administered medications on file prior to visit.    Review of Systems  Constitutional:  Negative for chills and fever.  HENT:  Positive for sore throat.   Respiratory:  Negative for cough, shortness of breath and wheezing.   Cardiovascular:  Negative for chest pain and palpitations.  Gastrointestinal:  Negative for nausea and vomiting.  Objective:    BP 130/72   Pulse 96   Temp 98.1 F (36.7 C) (Oral)   Ht 5\' 6"  (1.676 m)   Wt 158 lb (71.7 kg)   LMP 01/09/2011   SpO2 99%   BMI 25.50 kg/m   BP Readings from Last 3 Encounters:  02/21/23 130/72  01/17/23 116/74  12/18/22 (!) 88/70   Wt Readings from Last 3 Encounters:  02/21/23 158 lb (71.7 kg)  01/17/23 155 lb 12.8 oz (70.7 kg)  12/18/22 175 lb (79.4 kg)    Physical Exam Vitals reviewed.  Constitutional:      Appearance: She is well-developed.  HENT:     Head: Normocephalic and  atraumatic.     Right Ear: Hearing, tympanic membrane, ear canal and external ear normal. No decreased hearing noted. No drainage, swelling or tenderness. No middle ear effusion. No foreign body. Tympanic membrane is not erythematous or bulging.     Left Ear: Hearing, tympanic membrane, ear canal and external ear normal. No decreased hearing noted. No drainage, swelling or tenderness.  No middle ear effusion. No foreign body. Tympanic membrane is not erythematous or bulging.     Nose: Nose normal. No rhinorrhea.     Right Sinus: No maxillary sinus tenderness or frontal sinus tenderness.     Left Sinus: No maxillary sinus tenderness or frontal sinus tenderness.     Mouth/Throat:     Pharynx: Uvula midline. No oropharyngeal exudate or posterior oropharyngeal erythema.     Tonsils: No tonsillar abscesses.     Comments: Tonsils absent Eyes:     Conjunctiva/sclera: Conjunctivae normal.  Cardiovascular:     Rate and Rhythm: Regular rhythm.     Pulses: Normal pulses.     Heart sounds: Normal heart sounds.  Pulmonary:     Effort: Pulmonary effort is normal.     Breath sounds: Normal breath sounds. No wheezing, rhonchi or rales.  Lymphadenopathy:     Head:     Right side of head: No submental, submandibular, tonsillar, preauricular, posterior auricular or occipital adenopathy.     Left side of head: No submental, submandibular, tonsillar, preauricular, posterior auricular or occipital adenopathy.     Cervical: No cervical adenopathy.  Skin:    General: Skin is warm and dry.  Neurological:     Mental Status: She is alert.  Psychiatric:        Speech: Speech normal.        Behavior: Behavior normal.        Thought Content: Thought content normal.

## 2023-03-01 ENCOUNTER — Encounter
Admission: RE | Disposition: A | Discharge status: Home / Self Care | Payer: Self-pay | Source: Home / Self Care | Attending: Unknown Physician Specialty

## 2023-03-01 ENCOUNTER — Ambulatory Visit: Payer: 59 | Admitting: Anesthesiology

## 2023-03-01 ENCOUNTER — Ambulatory Visit
Admission: RE | Admit: 2023-03-01 | Discharge: 2023-03-01 | Disposition: A | Discharge status: Home / Self Care | Payer: 59 | Attending: Unknown Physician Specialty | Admitting: Unknown Physician Specialty

## 2023-03-01 ENCOUNTER — Other Ambulatory Visit: Payer: Self-pay

## 2023-03-01 DIAGNOSIS — H6123 Impacted cerumen, bilateral: Secondary | ICD-10-CM | POA: Diagnosis present

## 2023-03-01 DIAGNOSIS — H6091 Unspecified otitis externa, right ear: Secondary | ICD-10-CM | POA: Diagnosis not present

## 2023-03-01 DIAGNOSIS — G709 Myoneural disorder, unspecified: Secondary | ICD-10-CM | POA: Insufficient documentation

## 2023-03-01 DIAGNOSIS — R569 Unspecified convulsions: Secondary | ICD-10-CM | POA: Insufficient documentation

## 2023-03-01 HISTORY — PX: CERUMEN REMOVAL: SHX6571

## 2023-03-01 SURGERY — EXAM UNDER ANESTHESIA
Anesthesia: General | Site: Ear | Laterality: Bilateral

## 2023-03-01 MED ORDER — MIDAZOLAM HCL 2 MG/ML PO SYRP
20.0000 mg | ORAL_SOLUTION | Freq: Once | ORAL | Status: AC
  Administered 2023-03-01: 20 mg via ORAL

## 2023-03-01 MED ORDER — PROPOFOL 10 MG/ML IV BOLUS
INTRAVENOUS | Status: DC | PRN
Start: 2023-03-01 — End: 2023-03-01
  Administered 2023-03-01: 80 mg via INTRAVENOUS

## 2023-03-01 MED ORDER — CIPROFLOXACIN-DEXAMETHASONE 0.3-0.1 % OT SUSP
OTIC | Status: DC | PRN
  Administered 2023-03-01: 4 [drp] via OTIC

## 2023-03-01 MED ORDER — MIDAZOLAM HCL 2 MG/ML PO SYRP
ORAL_SOLUTION | ORAL | Status: AC
  Filled 2023-03-01: qty 10

## 2023-03-01 MED ORDER — PROPOFOL 500 MG/50ML IV EMUL
INTRAVENOUS | Status: DC | PRN
  Administered 2023-03-01: 200 ug/kg/min via INTRAVENOUS

## 2023-03-01 MED ORDER — PROPOFOL 1000 MG/100ML IV EMUL
INTRAVENOUS | Status: AC
  Filled 2023-03-01: qty 100

## 2023-03-01 SURGICAL SUPPLY — 9 items
BALL CTTN LRG ABS STRL LF (GAUZE/BANDAGES/DRESSINGS)
BLADE MYR LANCE NRW W/HDL (BLADE) IMPLANT
CANISTER SUCT 1200ML W/VALVE (MISCELLANEOUS) ×1 IMPLANT
COTTONBALL LRG STERILE PKG (GAUZE/BANDAGES/DRESSINGS) IMPLANT
GLOVE SURG ENC TEXT LTX SZ7.5 (GLOVE) ×1 IMPLANT
KIT TURNOVER KIT A (KITS) ×1 IMPLANT
STRAP BODY AND KNEE 60X3 (MISCELLANEOUS) ×1 IMPLANT
TOWEL OR 17X26 4PK STRL BLUE (TOWEL DISPOSABLE) ×1 IMPLANT
TUBING SUCTION CONN 0.25 STRL (TUBING) ×1 IMPLANT

## 2023-03-01 NOTE — H&P (Signed)
The patient's history has been reviewed, patient examined, no change in status, stable for surgery.  Questions were answered to the patients satisfaction.  

## 2023-03-01 NOTE — Anesthesia Preprocedure Evaluation (Signed)
Anesthesia Evaluation  Patient identified by MRN, date of birth, ID band Patient awake    Reviewed: Allergy & Precautions, NPO status , Patient's Chart, lab work & pertinent test results  History of Anesthesia Complications (+) DIFFICULT AIRWAY and history of anesthetic complications  Airway Mallampati: III  TM Distance: <3 FB Neck ROM: full    Dental  (+) Chipped   Pulmonary neg shortness of breath   Pulmonary exam normal        Cardiovascular Exercise Tolerance: Good (-) angina negative cardio ROS Normal cardiovascular exam     Neuro/Psych Seizures -, Well Controlled,   Neuromuscular disease  negative psych ROS   GI/Hepatic negative GI ROS, Neg liver ROS,,,  Endo/Other  negative endocrine ROS    Renal/GU negative Renal ROS  negative genitourinary   Musculoskeletal   Abdominal   Peds  Hematology negative hematology ROS (+)   Anesthesia Other Findings Past Medical History: 12/2020: COVID-19 age 15: Epilepsy (HCC)     Comment:  negative metabolic workup has brain abnormalities by MRI              (Dr. Quintin Alto, [facility]) No date: Hypertropia of left eye No date: Insomnia No date: Intellectual disability No date: Partial epilepsy with impairment of consciousness,  intractable (HCC) No date: Psychomotor agitation No date: Seizures [facility])     Comment:  None since approx 2018 05/07/1998: Status post VNS (vagus nerve stimulator) placement 05/07/1992: Temporal lobectomy behavior syndrome     Comment:  Right No date: Torticollis, ocular  Past Surgical History: 2005: ABDOMINAL HYSTERECTOMY No date: BRAIN SURGERY     Comment:  Temporal Lobe resection No date: CRANIOTOMY FOR TEMPORAL LOBECTOMY age 37: TONSILLECTOMY AND ADENOIDECTOMY  BMI    Body Mass Index: 25.26 kg/m      Reproductive/Obstetrics negative OB ROS                             Anesthesia Physical Anesthesia Plan  ASA:  3  Anesthesia Plan: General   Post-op Pain Management:    Induction: Intravenous  PONV Risk Score and Plan: Propofol infusion and TIVA  Airway Management Planned: Natural Airway and Nasal Cannula  Additional Equipment:   Intra-op Plan:   Post-operative Plan:   Informed Consent: I have reviewed the patients History and Physical, chart, labs and discussed the procedure including the risks, benefits and alternatives for the proposed anesthesia with the patient or authorized representative who has indicated his/her understanding and acceptance.     Dental Advisory Given  Plan Discussed with: Anesthesiologist, CRNA and Surgeon  Anesthesia Plan Comments: (Mother consented for risks of anesthesia including but not limited to:  - adverse reactions to medications - risk of airway placement if required - damage to eyes, teeth, lips or other oral mucosa - nerve damage due to positioning  - sore throat or hoarseness - Damage to heart, brain, nerves, lungs, other parts of body or loss of life  She voiced understanding and assent.)       Anesthesia Quick Evaluation

## 2023-03-01 NOTE — Op Note (Signed)
03/01/2023  8:37 AM    Lindsey [name]  [id number]   Pre-Op Dx: Cerumen, Impacted,  Post-op Dx: SAME  Proc: Exam under anesthesia with bilateral removal of cerumen  Surg:  Davina Poke  Anes:  GOT  EBL: None  Comp: None  Findings: Severe right complete wax impaction with mild to moderate otitis externa tympanic membrane intact.  Left ear cerumen impaction removed  Procedure: [name] was then found holding area taken operating placed supine position.  After general IV sedation the operating microscope microscope was brought in the field.  Beginning on the right-hand side the ear canal was examined there was complete wax impaction filling the canal using the curette and alligator forceps the wax impaction was removed.  There was significant edema and erythema of the ear canal on this side.  Tympanic membrane was intact.  Ciprodex drops were instilled next canal followed by cottonball.  In similar fashion the left ear was examined again there was cerumen which were removed with a curette there was no otitis externa on the side.  The patient was then returned to anesthesia where she was awakened in the operating room and taken to the recovery room in stable condition.  Dispo:   Good  Plan: Discharge to home on topical drops follow-up in the office in 3 months  Davina Poke  03/01/2023 8:37 AM

## 2023-03-01 NOTE — Transfer of Care (Signed)
Immediate Anesthesia Transfer of Care Note  Patient: Lindsey [name]  Procedure(s) Performed: EXAM UNDER ANESTHESIA (Bilateral: Ear) CERUMEN REMOVAL (Bilateral: Ear)  Patient Location: PACU  Anesthesia Type: General  Level of Consciousness: awake, alert  and patient cooperative  Airway and Oxygen Therapy: Patient Spontanous Breathing and Patient connected to supplemental oxygen  Post-op Assessment: Post-op Vital signs reviewed, Patient's Cardiovascular Status Stable, Respiratory Function Stable, Patent Airway and No signs of Nausea or vomiting  Post-op Vital Signs: Reviewed and stable  Complications: No notable events documented.

## 2023-03-01 NOTE — Anesthesia Postprocedure Evaluation (Signed)
Anesthesia Post Note  Patient: Lindsey [name]  Procedure(s) Performed: EXAM UNDER ANESTHESIA (Bilateral: Ear) CERUMEN REMOVAL (Bilateral: Ear)  Patient location during evaluation: PACU Anesthesia Type: General Level of consciousness: awake and alert Pain management: pain level controlled Vital Signs Assessment: post-procedure vital signs reviewed and stable Respiratory status: spontaneous breathing, nonlabored ventilation, respiratory function stable and patient connected to nasal cannula oxygen Cardiovascular status: blood pressure returned to baseline and stable Postop Assessment: no apparent nausea or vomiting Anesthetic complications: no   No notable events documented.   Last Vitals:  Vitals:   03/01/23 0838 03/01/23 0845  BP: 97/71 101/79  Pulse: 85 81  Resp: 15 17  Temp: 36.8 C 36.8 C  SpO2: 97% 95%    Last Pain:  Vitals:   03/01/23 0838  TempSrc:   PainSc: Asleep                 Cleda Mccreedy Alayasia Breeding

## 2023-03-02 ENCOUNTER — Encounter: Payer: Self-pay | Admitting: Unknown Physician Specialty

## 2023-04-01 ENCOUNTER — Ambulatory Visit: Payer: 59

## 2023-04-01 ENCOUNTER — Ambulatory Visit
Admission: EM | Admit: 2023-04-01 | Discharge: 2023-04-01 | Disposition: A | Discharge status: Home / Self Care | Payer: 59

## 2023-04-01 DIAGNOSIS — R051 Acute cough: Secondary | ICD-10-CM | POA: Diagnosis not present

## 2023-04-01 DIAGNOSIS — J0140 Acute pansinusitis, unspecified: Secondary | ICD-10-CM

## 2023-04-01 MED ORDER — AMOXICILLIN 875 MG PO TABS: 875.0000 mg | ORAL_TABLET | Freq: Two times a day (BID) | ORAL | 0 refills | Status: AC

## 2023-04-01 MED ORDER — PROMETHAZINE-DM 6.25-15 MG/5ML PO SYRP
5.0000 mL | ORAL_SOLUTION | Freq: Four times a day (QID) | ORAL | 0 refills | Status: DC | PRN
Start: 2023-04-01 — End: 2023-04-24

## 2023-04-01 NOTE — ED Provider Notes (Signed)
[name]    CSN: [id number] Arrival date & time: 04/01/23  0802      History   Chief Complaint Chief Complaint  Patient presents with   Cough    Cough and Nasal congestions x 1 week     HPI Lindsey [name] is a 37 y.o. female.   HPI Patient with a history of epilepsy and cerebral palsy, is here today,  accompanied by her legal guardian , for evaluation of cough, nasal congestion 1 week.  Cough is non productive. Patient recently exposed to a sick relative with similar type symptoms.  Patient attempted relief with the Xanax without significant improvement. Denies wheezing, difficulty breathing, chest tightness , or fever. Past Medical History:  Diagnosis Date   COVID-19 12/2020   Epilepsy [facility]) age 33   negative metabolic workup has brain abnormalities by MRI (Dr. Quintin Alto, [facility])   Hypertropia of left eye    Insomnia    Intellectual disability    Partial epilepsy with impairment of consciousness, intractable (HCC)    Psychomotor agitation    Seizures (HCC)    None since approx 2018   Status post VNS (vagus nerve stimulator) placement 05/07/1998   Temporal lobectomy behavior syndrome 05/07/1992   Right   Torticollis, ocular     Patient Active Problem List   Diagnosis Date Noted   Rash 01/17/2023   Otalgia of right ear 01/17/2023   Heat rash 11/14/2022   Paronychia of right middle finger 09/24/2022   Folic acid deficiency (non anemic) 08/12/2022   Hospital discharge follow-up 05/31/2022   Anxiety and depression 05/07/2022   Seizure disorder (HCC) 05/07/2022   Pneumonia of left lower lobe due to infectious organism 05/07/2022   Leukopenia 03/27/2022   Anticonvulsant-induced pancytopenia (HCC) 03/27/2022   Recurrent epistaxis 02/09/2022   Allergic rhinitis due to grass pollen 07/10/2021   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   COVID-19 12/21/2020   Drug-induced leukopenia (HCC) 06/29/2020   Change in behavior 05/04/2020    Moderate intellectual disability 05/04/2020   Poor dentition 09/02/2017   Unintentional weight loss 07/26/2017   Constipation 11/10/2015   Long-term use of high-risk medication 10/11/2015   Xerosis of skin 12/12/2014   Restless legs syndrome 07/12/2014   Ingrown toenail without infection 12/15/2012   Pharyngitis 10/30/2012   Restlessness and agitation 09/10/2012   Routine general medical examination at a health care facility 06/08/2012   Cerebral palsy (HCC) 05/08/2012   Acneiform dermatitis 06/30/2011   Partial epilepsy with intractable epilepsy (HCC) 03/23/2011    Past Surgical History:  Procedure Laterality Date   ABDOMINAL HYSTERECTOMY  2005   BRAIN SURGERY     Temporal Lobe resection   CERUMEN REMOVAL Bilateral 03/01/2023   Procedure: CERUMEN REMOVAL;  Surgeon: Linus Salmons, MD;  Location: [facility];  Service: ENT;  Laterality: Bilateral;   CRANIOTOMY FOR TEMPORAL LOBECTOMY     TONSILLECTOMY AND ADENOIDECTOMY  age 77    OB History   No obstetric history on file.      Home Medications    Prior to Admission medications   Medication Sig Start Date End Date Taking? Authorizing Provider  ALPRAZolam Prudy Feeler) 0.25 MG tablet Take by mouth. 03/08/23  Yes [provider]  amoxicillin (AMOXIL) 875 MG tablet Take 1 tablet (875 mg total) by mouth 2 (two) times daily for 7 days. 04/01/23 04/08/23 Yes Bing Neighbors, NP  ARIPiprazole (ABILIFY) 5 MG tablet Take 5 mg by mouth daily. 03/22/23  Yes [provider]  promethazine-dextromethorphan (PROMETHAZINE-DM) 6.25-15 MG/5ML syrup Take 5 mLs by mouth 4 (four) times daily as needed for cough. 04/01/23  Yes Bing Neighbors, NP  chlorhexidine (PERIDEX) 0.12 % solution Use as directed 15 mLs in the mouth or throat 2 (two) times daily. 08/09/22   Sherlene Shams, MD  diazepam (DIASTAT ACUDIAL) 10 MG GEL Place 20 mg rectally once.    [provider]  diazepam (VALIUM) 2 MG tablet TAKE 1 TABLET BY  MOUTH EVERY MORNING AND  3 TABLETS IN THE EVENING AS DIRECTED. 11/30/22   Sherlene Shams, MD  EPINEPHrine 0.3 mg/0.3 mL IJ SOAJ injection INJECT 0.3 MG INTRAMUSCULARLY AS NEEDED FOR ANAPHYLAXIS 01/10/23   Sherlene Shams, MD  hydrocortisone 2.5%-nystatin-zinc oxide 20% 1:1:1 ointment mixture Apply topically 2 (two) times daily. 01/17/23   Kara Dies, NP  LAMICTAL 100 MG tablet Take 100 mg by mouth 2 (two) times daily.  08/03/16   [provider]  levETIRAcetam (KEPPRA) 750 MG tablet Take 3 tablets (2,250 mg total) by mouth daily. TAKE 1 and a half pills twice a day 04/12/20   Clapacs, John T, MD  melatonin 5 MG TABS Take 5 mg by mouth at bedtime.    [provider]  nystatin-triamcinolone ointment (MYCOLOG) Apply 1 Application topically 2 (two) times daily. 01/17/23   Kara Dies, NP  PARoxetine (PAXIL) 30 MG tablet Take 1 tablet (30 mg total) by mouth 2 (two) times daily. 09/24/22   Sherlene Shams, MD  QUEtiapine (SEROQUEL) 300 MG tablet Take 300 mg by mouth at bedtime. 10/22/22   [provider]    Family History History reviewed. No pertinent family history.  Social History Social History   Tobacco Use   Smoking status: Never   Smokeless tobacco: Never  Vaping Use   Vaping status: Never Used  Substance Use Topics   Alcohol use: No    Alcohol/week: 0.0 standard drinks of alcohol   Drug use: No     Allergies   Other, Influenza vac split quad, Benadryl [diphenhydramine hcl], Montelukast, Ropinirole, Zyrtec [cetirizine hcl], and Peanut-containing drug products   Review of Systems Review of Systems Pertinent negatives listed in HPI   Physical Exam Triage Vital Signs ED Triage Vitals  Encounter Vitals Group     BP      Systolic BP Percentile      Diastolic BP Percentile      Pulse      Resp      Temp      Temp src      SpO2      Weight      Height      Head Circumference      Peak Flow      Pain Score      Pain Loc      Pain  Education      Exclude from Growth Chart    No data found.  Updated Vital Signs BP 109/81   Pulse (!) 108   Temp 98.3 F (36.8 C)   Resp 18   LMP 01/09/2011   SpO2 95%   Visual Acuity Right Eye Distance:   Left Eye Distance:   Bilateral Distance:    Right Eye Near:   Left Eye Near:    Bilateral Near:     Physical Exam Vitals reviewed.  Constitutional:      Appearance: She is ill-appearing.  HENT:     Head: Normocephalic and atraumatic.     Right Ear:  Tympanic membrane, ear canal and external ear normal.     Left Ear: Tympanic membrane, ear canal and external ear normal.     Nose: Mucosal edema, congestion and rhinorrhea present.     Right Turbinates: Enlarged.     Left Turbinates: Enlarged.  Eyes:     Extraocular Movements: Extraocular movements intact.     Pupils: Pupils are equal, round, and reactive to light.  Cardiovascular:     Rate and Rhythm: Normal rate and regular rhythm.  Pulmonary:     Breath sounds: Normal breath sounds.  Lymphadenopathy:     Cervical: No cervical adenopathy.  Skin:    General: Skin is warm.     Capillary Refill: Capillary refill takes less than 2 seconds.  Neurological:     Mental Status: She is alert. Mental status is at baseline.      UC Treatments / Results  Labs (all labs ordered are listed, but only abnormal results are displayed) Labs Reviewed - No data to display  EKG   Radiology No results found.  Procedures Procedures (including critical care time)  Medications Ordered in UC Medications - No data to display  Initial Impression / Assessment and Plan / UC Course  I have reviewed the triage vital signs and the nursing notes.  Pertinent labs & imaging results that were available during my care of the patient were reviewed by me and considered in my medical decision making (see chart for details).    Symptoms and evaluation findings significant for acute sinusitis uncomplicated.  Treated empirically with  amoxicillin 875 twice daily for 7 days.  Promethazine DM for acute cough and nasal symptoms 4 times daily as needed.  Force fluids.  Return precautions given.   Final Clinical Impressions(s) / UC Diagnoses   Final diagnoses:  Acute non-recurrent pansinusitis  Acute cough     Discharge Instructions      Follow-up with PCP or return for evaluation of symptoms have not improved within 7 days.  Take entire course of medication.     ED Prescriptions     Medication Sig Dispense Auth. Provider   promethazine-dextromethorphan (PROMETHAZINE-DM) 6.25-15 MG/5ML syrup Take 5 mLs by mouth 4 (four) times daily as needed for cough. 118 mL Bing Neighbors, NP   amoxicillin (AMOXIL) 875 MG tablet Take 1 tablet (875 mg total) by mouth 2 (two) times daily for 7 days. 14 tablet Bing Neighbors, NP      PDMP not reviewed this encounter.   Bing Neighbors, NP 04/01/23 [phone]

## 2023-04-01 NOTE — ED Triage Notes (Signed)
Patient to Urgent Care with complaints of cough and nasal congestion/ sneezing. Denies any known fevers.   Symptoms started approx 1 week ago. Mom sick w/ same symptoms.   Taking Mucinex.

## 2023-04-01 NOTE — Discharge Instructions (Signed)
Follow-up with PCP or return for evaluation of symptoms have not improved within 7 days.  Take entire course of medication.

## 2023-04-11 ENCOUNTER — Other Ambulatory Visit: Payer: Self-pay

## 2023-04-11 ENCOUNTER — Emergency Department
Admission: EM | Admit: 2023-04-11 | Discharge: 2023-04-11 | Disposition: A | Discharge status: Home / Self Care | Payer: 59 | Attending: Student in an Organized Health Care Education/Training Program | Admitting: Student in an Organized Health Care Education/Training Program

## 2023-04-11 ENCOUNTER — Encounter: Payer: Self-pay | Admitting: Emergency Medicine

## 2023-04-11 DIAGNOSIS — F419 Anxiety disorder, unspecified: Secondary | ICD-10-CM | POA: Diagnosis not present

## 2023-04-11 DIAGNOSIS — R350 Frequency of micturition: Secondary | ICD-10-CM | POA: Diagnosis present

## 2023-04-11 DIAGNOSIS — N3000 Acute cystitis without hematuria: Secondary | ICD-10-CM | POA: Insufficient documentation

## 2023-04-11 LAB — CBC
HCT: 41.7 % (ref 36.0–46.0)
Hemoglobin: 14.6 g/dL (ref 12.0–15.0)
MCH: 30.2 pg (ref 26.0–34.0)
MCHC: 35 g/dL (ref 30.0–36.0)
MCV: 86.2 fL (ref 80.0–100.0)
Platelets: 256 10*3/uL (ref 150–400)
RBC: 4.84 MIL/uL (ref 3.87–5.11)
RDW: 12.5 % (ref 11.5–15.5)
WBC: 4.6 10*3/uL (ref 4.0–10.5)
nRBC: 0 % (ref 0.0–0.2)

## 2023-04-11 LAB — BASIC METABOLIC PANEL
Anion gap: 10 (ref 5–15)
BUN: 10 mg/dL (ref 6–20)
CO2: 23 mmol/L (ref 22–32)
Calcium: 9.2 mg/dL (ref 8.9–10.3)
Chloride: 105 mmol/L (ref 98–111)
Creatinine, Ser: 0.69 mg/dL (ref 0.44–1.00)
GFR, Estimated: >60 mL/min (ref >60)
Glucose, Bld: 102 mg/dL — ABNORMAL HIGH (ref 70–99)
Potassium: 3.3 mmol/L — ABNORMAL LOW (ref 3.5–5.1)
Sodium: 138 mmol/L (ref 135–145)

## 2023-04-11 LAB — URINALYSIS, ROUTINE W REFLEX MICROSCOPIC
Bilirubin Urine: NEGATIVE
Glucose, UA: NEGATIVE mg/dL
Ketones, ur: 5 mg/dL — AB
Nitrite: NEGATIVE
Protein, ur: >=300 mg/dL — AB
Specific Gravity, Urine: 1.033 — ABNORMAL HIGH (ref 1.005–1.030)
pH: 5 (ref 5.0–8.0)

## 2023-04-11 LAB — POC URINE PREG, ED: Preg Test, Ur: NEGATIVE

## 2023-04-11 LAB — CK: Total CK: 133 U/L (ref 38–234)

## 2023-04-11 MED ORDER — ALPRAZOLAM 0.5 MG PO TABS
0.2500 mg | ORAL_TABLET | Freq: Once | ORAL | Status: AC
  Administered 2023-04-11: 0.25 mg via ORAL
  Filled 2023-04-11: qty 1

## 2023-04-11 MED ORDER — CEPHALEXIN 500 MG PO CAPS
500.0000 mg | ORAL_CAPSULE | Freq: Once | ORAL | Status: AC
  Administered 2023-04-11: 500 mg via ORAL
  Filled 2023-04-11: qty 1

## 2023-04-11 MED ORDER — CEPHALEXIN 500 MG PO CAPS: 500.0000 mg | ORAL_CAPSULE | Freq: Two times a day (BID) | ORAL | 0 refills | Status: AC

## 2023-04-11 NOTE — ED Notes (Signed)
Unable to collect urine at this time.

## 2023-04-11 NOTE — ED Triage Notes (Signed)
Pt to ED via POV with mother. Pts mother states that patient had a seizure this morning. Pt mother reports that she has hx/o epilepsy and that she was telling her this morning that she was seeing ghost. Pt mother reports that this is how her seizure present. Pt mother states that she gave the pt Valtoco nasal spray around 0640 this morning.   Pt mother also reports pt is stating that she needs to use the bathroom more frequently but is not passing much urine. Pt also recently has a change in her medication and mother reports that she has been having more tremors.

## 2023-04-11 NOTE — ED Provider Notes (Signed)
[facility] Provider Note    Event Date/Time   First MD Initiated Contact with Patient 04/11/23 0831     (approximate)   History   Seizures   HPI  Lindsey [name] is a 37 y.o. female who presents to the ER for evaluation of tremor and some agitation and behavioral changes over the past few days.  Mother reports that patient has had some medication changes including switching from Seroquel to Abilify now back to Seroquel.  Has been having some increasing agitation at night as well as frequent urination.  Does have a history of recurrent UTIs.  Patient also reporting that she is seeing ghosts.  She is currently reporting anxiety and feeling scared.  Mother reports that she does have history of anxiety and when she has symptoms like this she does sometimes give Xanax which helps but she has not given that medication thus far.     Physical Exam   Triage Vital Signs: ED Triage Vitals  Encounter Vitals Group     BP 04/11/23 0715 121/82     Systolic BP Percentile --      Diastolic BP Percentile --      Pulse Rate 04/11/23 0715 (!) 118     Resp 04/11/23 0715 18     Temp 04/11/23 0715 98.6 F (37 C)     Temp Source 04/11/23 0715 Axillary     SpO2 04/11/23 0715 96 %     Weight 04/11/23 0716 155 lb (70.3 kg)     Height 04/11/23 0716 5\' 6"  (1.676 m)     Head Circumference --      Peak Flow --      Pain Score 04/11/23 0715 0     Pain Loc --      Pain Education --      Exclude from Growth Chart --     Most recent vital signs: Vitals:   04/11/23 0715  BP: 121/82  Pulse: (!) 118  Resp: 18  Temp: 98.6 F (37 C)  SpO2: 96%     Constitutional: Alert  Eyes: Conjunctivae are normal.  Head: Atraumatic. Nose: No congestion/rhinnorhea. Mouth/Throat: Mucous membranes are moist.   Neck: Painless ROM.  Cardiovascular:   Good peripheral circulation. Respiratory: Normal respiratory effort.  No retractions.  Gastrointestinal: Soft and nontender.   Musculoskeletal:  no deformity Neurologic: Chronic spasticity throughout.  No leadpipe rigidity.  Skin:  Skin is warm, dry and intact. No rash noted. Psychiatric: Mood and affect are anxious    ED Results / Procedures / Treatments   Labs (all labs ordered are listed, but only abnormal results are displayed) Labs Reviewed  BASIC METABOLIC PANEL - Abnormal; Notable for the following components:      Result Value   Potassium 3.3 (*)    Glucose, Bld 102 (*)    All other components within normal limits  URINALYSIS, ROUTINE W REFLEX MICROSCOPIC - Abnormal; Notable for the following components:   Color, Urine AMBER (*)    APPearance CLOUDY (*)    Specific Gravity, Urine 1.033 (*)    Hgb urine dipstick SMALL (*)    Ketones, ur 5 (*)    Protein, ur >=300 (*)    Leukocytes,Ua TRACE (*)    Bacteria, UA MANY (*)    All other components within normal limits  CBC  CK  CK  POC URINE PREG, ED  CBG MONITORING, ED     EKG     RADIOLOGY  PROCEDURES:  Critical Care performed:   Procedures   MEDICATIONS ORDERED IN ED: Medications  cephALEXin (KEFLEX) capsule 500 mg (500 mg Oral Given 04/11/23 0850)  ALPRAZolam Prudy Feeler) tablet 0.25 mg (0.25 mg Oral Given 04/11/23 0856)     IMPRESSION / MDM / ASSESSMENT AND PLAN / ED COURSE  I reviewed the triage vital signs and the nursing notes.                              Differential diagnosis includes, but is not limited to, anxiety, electrolyte abnormality, UTI, medication effect, NMS, serotonin syndrome, withdrawal  Patient presenting to the ER for evaluation of symptoms as described above.  Based on symptoms, risk factors and considered above differential, this presenting complaint could reflect a potentially life-threatening illness therefore the patient will be placed on continuous pulse oximetry and telemetry for monitoring.  Laboratory evaluation will be sent to evaluate for the above complaints.  Urinalysis with many bacteria  and given frequent urination question UTI.  Does have tremor and chronic spasticity that mother reports is baseline.  Given her medication list NMS on the differential but she is otherwise nontoxic-appearing will check CK to further evaluate.  Will give Xanax is large component of this may be anxiety driven.    Clinical Course as of 04/11/23 0935  Thu Apr 11, 2023  0927 CK is normal.  She appears much more comfortable after Xanax.  I do suspect a large component of this is anxiety related.  Will give prescription for antibiotic.  Does appear stable and appropriate for outpatient follow-up. [PR]    Clinical Course User Index [PR] Willy Eddy, MD     FINAL CLINICAL IMPRESSION(S) / ED DIAGNOSES   Final diagnoses:  Acute cystitis without hematuria     Rx / DC Orders   ED Discharge Orders          Ordered    cephALEXin (KEFLEX) 500 MG capsule  2 times daily        04/11/23 4098             Note:  This document was prepared using Dragon voice recognition software and may include unintentional dictation errors.    Willy Eddy, MD 04/11/23 [phone]

## 2023-04-16 ENCOUNTER — Telehealth: Payer: Self-pay

## 2023-04-16 NOTE — Transitions of Care (Post Inpatient/ED Visit) (Signed)
I spoke with pts mom (DPR signed) and pt was having some behavorial issues and UTI was dx at ED. Pt is taking abx and doing better.pt should finish abx on 04/18/23. Pt mom said pt already has appt mon 04/24/23 for CPX with Dr Darrick Huntsman. UC & ED precautions given ands pt voiced understanding. Sendiong note to Dr Darrick Huntsman      04/16/2023  Name: Lindsey [name] MRN: [id number] DOB: [birth year]  Today's TOC FU Call Status: Today's TOC FU Call Status:: Successful TOC FU Call Completed TOC FU Call Complete Date: 04/16/23 Patient's Name and Date of Birth confirmed.  Transition Care Management Follow-up Telephone Call Date of Discharge: 04/12/23 Discharge Facility: [facility]) Type of Discharge: Emergency Department Reason for ED Visit: Other: (I spoke with pts mom (DPR signed) and pt was having some behavorial issues and UTI was dx at ED.) How have you been since you were released from the hospital?: Better Any questions or concerns?: No  Items Reviewed: Did you receive and understand the discharge instructions provided?: Yes Medications obtained,verified, and reconciled?: Partial Review Completed Reason for Partial Mediation Review: pt's mom said pt has upcoming CPX with Dr Darrick Huntsman and pt is taking cephalexin 500 mg bid until 04/18/23. Any new allergies since your discharge?: No Dietary orders reviewed?: NA Do you have support at home?: Yes People in Home: parent(s) Name of Support/Comfort Primary Source: Lindsey [name]  Medications Reviewed Today: Medications Reviewed Today   Medications were not reviewed in this encounter     Home Care and Equipment/Supplies: Were Home Health Services Ordered?: NA Any new equipment or medical supplies ordered?: NA  Functional Questionnaire: Do you need assistance with bathing/showering or dressing?: Yes (pts mom assist pt with her daily needs routinely.) Do you need assistance with meal preparation?: Yes Do you have difficulty  managing or taking your medications?: Yes  Follow up appointments reviewed: PCP Follow-up appointment confirmed?: Yes Date of PCP follow-up appointment?: 04/24/23 Follow-up Provider: [facility] Follow-up appointment confirmed?: NA Do you need transportation to your follow-up appointment?: No Do you understand care options if your condition(s) worsen?: Yes-patient verbalized understanding    SIGNATURE Lewanda Rife, LPN

## 2023-04-23 ENCOUNTER — Other Ambulatory Visit: Payer: Self-pay | Admitting: Internal Medicine

## 2023-04-24 ENCOUNTER — Ambulatory Visit: Payer: 59 | Admitting: Internal Medicine

## 2023-04-24 ENCOUNTER — Encounter: Payer: Self-pay | Admitting: Internal Medicine

## 2023-04-24 VITALS — BP 104/72 | HR 97 | Ht 66.0 in | Wt 159.2 lb

## 2023-04-24 DIAGNOSIS — Z0000 Encounter for general adult medical examination without abnormal findings: Secondary | ICD-10-CM | POA: Diagnosis not present

## 2023-04-24 DIAGNOSIS — E876 Hypokalemia: Secondary | ICD-10-CM

## 2023-04-24 DIAGNOSIS — Z5181 Encounter for therapeutic drug level monitoring: Secondary | ICD-10-CM | POA: Diagnosis not present

## 2023-04-24 NOTE — Progress Notes (Unsigned)
Patient ID: Lindsey [name], female    DOB: [birth year]  Age: 37 y.o. MRN: [id number]  The patient is here for annual preventive examination and management of other chronic and acute problems. She is accompanied by her mother and guardian, Monchell Adema    The risk factors are reflected in the social history.   The roster of all physicians providing medical care to patient - is listed in the Snapshot section of the chart.   Activities of daily living:  The patient is 100% independent in all ADLs: dressing, toileting, feeding as well as independent mobility   Home safety : The patient has smoke detectors in the home. They wear seatbelts.  There are no unsecured firearms at home. There is no violence in the home.    There is no risks for hepatitis, STDs or HIV. There is no   history of blood transfusion. They have no travel history to infectious disease endemic areas of the world.   The patient has seen their dentist in the last six month. They have seen their eye doctor in the last year. The patient  denies slight hearing difficulty with regard to whispered voices and some television programs.  They have deferred audiologic testing in the last year.  They do not  have excessive sun exposure. Discussed the need for sun protection: hats, long sleeves and use of sunscreen if there is significant sun exposure.    Diet: the importance of a healthy diet is discussed. They do have a healthy diet.   The benefits of regular aerobic exercise were discussed. The patient  does not exercise due to intellectual disability     Depression screen: there are no signs or vegative symptoms of depression- irritability, change in appetite, anhedonia, sadness/tearfullness.   The following portions of the patient's history were reviewed and updated as appropriate: allergies, current medications, past family history, past medical history,  past surgical history, past social history  and problem list.   Visual acuity was  not assessed per patient preference since the patient has regular follow up with an  ophthalmologist. Hearing and body mass index were assessed and reviewed.    During the course of the visit the patient was educated and counseled about appropriate screening and preventive services including : fall prevention , diabetes screening, nutrition counseling, colorectal cancer screening, and recommended immunizations.    Chief Complaint:   Recurrent episodes of agitation managed with seroquel and diazepam .  Treated in ER on Dec 5 with kefflex  for presumed UTI based on UA,  calmed down after alprazolam 0.25 mg given.  CBC  , CMET normal   except for potassium of 3.3   History of seizure disorder.:  due  for lamictal and Keppra levels, Dr Jenean Lindau and Dr Kizzie Bane , [facility]   has added benztropine at night 0.5 to help with nocturnal agitation .  Did not tolerate change from seroquel to abilify due to new onset visual hallucinations (classified as seizures)  tremors.  Back on seroquel  Ears were impacted , cleaned out by Dr Jenne Campus  in November.   Was given something orally that was calming but not sedating    Review of Symptoms  Patient denies headache, fevers, malaise, unintentional weight loss, skin rash, eye pain, sinus congestion and sinus pain, sore throat, dysphagia,  hemoptysis , cough, dyspnea, wheezing, chest pain, palpitations, orthopnea, edema, abdominal pain, nausea, melena, diarrhea, constipation, flank pain, dysuria, hematuria, urinary  Frequency, nocturia, numbness,  tingling, seizures,  Focal weakness, Loss of consciousness,  Tremor, insomnia, depression, anxiety, and suicidal ideation.    Physical Exam:  LMP 01/09/2011    Physical Exam Vitals reviewed.  Constitutional:      General: She is not in acute distress.    Appearance: She is normal weight. She is not ill-appearing, toxic-appearing or diaphoretic.  HENT:     Head: Normocephalic.  Eyes:      General: No scleral icterus.       Right eye: No discharge.        Left eye: No discharge.     Conjunctiva/sclera: Conjunctivae normal.  Cardiovascular:     Rate and Rhythm: Normal rate and regular rhythm.     Heart sounds: Normal heart sounds.  Pulmonary:     Effort: Pulmonary effort is normal. No respiratory distress.     Breath sounds: Normal breath sounds.  Musculoskeletal:        General: Normal range of motion.  Skin:    General: Skin is warm and dry.  Neurological:     General: No focal deficit present.     Mental Status: She is alert and oriented to person, place, and time. Mental status is at baseline.  Psychiatric:        Mood and Affect: Mood normal.        Behavior: Behavior normal.        Thought Content: Thought content normal.        Judgment: Judgment normal.     Assessment and Plan: There are no diagnoses linked to this encounter.  No follow-ups on file.  Sherlene Shams, MD

## 2023-04-25 NOTE — Assessment & Plan Note (Signed)

## 2023-05-04 ENCOUNTER — Other Ambulatory Visit: Payer: Self-pay | Admitting: Internal Medicine

## 2023-05-06 ENCOUNTER — Other Ambulatory Visit: Payer: Self-pay

## 2023-05-06 ENCOUNTER — Other Ambulatory Visit: Payer: Self-pay | Admitting: Internal Medicine

## 2023-05-06 MED ORDER — PAROXETINE HCL 30 MG PO TABS: 30.0000 mg | ORAL_TABLET | Freq: Two times a day (BID) | ORAL | 1 refills | Status: DC

## 2023-05-14 ENCOUNTER — Ambulatory Visit
Admission: RE | Admit: 2023-05-14 | Discharge: 2023-05-14 | Disposition: A | Discharge status: Home / Self Care | Payer: 59 | Source: Ambulatory Visit | Attending: Emergency Medicine | Admitting: Emergency Medicine

## 2023-05-14 ENCOUNTER — Ambulatory Visit: Payer: Self-pay | Admitting: Internal Medicine

## 2023-05-14 VITALS — BP 100/69 | HR 83 | Temp 97.6°F | Resp 16 | Ht 66.0 in | Wt 159.2 lb

## 2023-05-14 DIAGNOSIS — L209 Atopic dermatitis, unspecified: Secondary | ICD-10-CM

## 2023-05-14 MED ORDER — HYDROCORTISONE 0.5 % EX CREA: 1.0000 | TOPICAL_CREAM | Freq: Two times a day (BID) | CUTANEOUS | 0 refills | Status: DC

## 2023-05-14 NOTE — ED Triage Notes (Signed)
 Pt c/o redness outside of R eye since this AM. States Lindsey [name] woke up with right eye red around it,possibly like a bite - Entered by patient

## 2023-05-14 NOTE — Telephone Encounter (Signed)
 Copied from CRM [id number]. Topic: Clinical - Red Word Triage >> May 14, 2023  8:01 AM Eleanor C wrote: [facility] that prompted transfer to Nurse Triage: patient woke up with a swollen eye and mother thinks it looks like something bit her close to that eye- has an appointment at Urgent Care at 9:45 but wanted to check with us  in the meantime  Chief Complaint: Patient mother requesting an appointment with  doctor office  instead of Urgent Care.  Patient has a swollen eye . Mother believe something bit her close to the eye. Mother verbalized she would like to have her daughter seen in the office. Mother would not allow RN Agent to triage  for a office appointment RN agent attempted to explain this is the process and I could send message to the provider. Mother stated never mind she will just call back to the office. Symptoms: Swollen Eye  Disposition: [] ED /[x] Urgent Care (no appt availability in office) / [] Appointment(In office/virtual)/ []  [facility]/ [] Home Care/ [] Refused Recommended Disposition /[] [facility]/ []  Follow-up with PCP Additional Notes:  Mother advised she scheduled her Urgent Care  appointment for 945 am. However she prefer the office since it is black ice and she lives 5 minutes away. RN Agent offered a  1040 am in the office and mother declined . She stated she would rather  take her to the Urgent Care because the time was convenience. Reason for Disposition  Eyelid is red and painful (or tender to touch)  Protocols used: Eye - Swelling-A-AH

## 2023-05-14 NOTE — Discharge Instructions (Addendum)
 Apply the 0.5% hydrocortisone  cream twice daily to the area of redness on the side of Lindsey [name]'s face.  This should help with redness and irritation.  Monitor the area for any increased redness, swelling, heat, pain, or if [name] starts to run a fever.  If any of these conditions arise please return for reevaluation or follow-up with your primary care provider.

## 2023-05-14 NOTE — ED Provider Notes (Signed)
 [facility]    CSN: [id number] Arrival date & time: 05/14/23  0914      History   Chief Complaint Chief Complaint  Patient presents with   Eye Problem    Appt    HPI Lindsey [name] is a 38 y.o. female.   HPI  38 year old female with a past medical history significant for cerebral palsy, partial epilepsy, restless leg syndrome, dry skin, and drug-induced extraparametal movement disorder presents for evaluation of redness to her right temple that her mom noticed this morning.  She reports that the redness has improved.  She is concerned about possible conjunctivitis.  Patient has not had a fever.  She does report that yesterday the patient woke up and she was hoarse sounding but no other cold symptoms.  Past Medical History:  Diagnosis Date   Allergy 1991   Anxiety 2000   COVID-19 12/2020   Epilepsy [facility]) age 49   negative metabolic workup has brain abnormalities by MRI ([name], [facility])   Hypertropia of left eye    Insomnia    Intellectual disability    Partial epilepsy with impairment of consciousness, intractable (HCC)    Psychomotor agitation    Seizures (HCC)    None since approx 2018   Status post VNS (vagus nerve stimulator) placement 05/07/1998   Temporal lobectomy behavior syndrome 05/07/1992   Right   Torticollis, ocular     Patient Active Problem List   Diagnosis Date Noted   Rash 01/17/2023   Otalgia of right ear 01/17/2023   Heat rash 11/14/2022   Paronychia of right middle finger 09/24/2022   Folic acid  deficiency (non anemic) 08/12/2022   Hospital discharge follow-up 05/31/2022   Anxiety and depression 05/07/2022   Seizure disorder (HCC) 05/07/2022   Pneumonia of left lower lobe due to infectious organism 05/07/2022   Leukopenia 03/27/2022   Anticonvulsant-induced pancytopenia (HCC) 03/27/2022   Recurrent epistaxis 02/09/2022   Allergic rhinitis due to grass pollen 07/10/2021   Drug-induced extrapyramidal movement disorder  02/12/2021   COVID-19 12/21/2020   Drug-induced leukopenia (HCC) 06/29/2020   Change in behavior 05/04/2020   Moderate intellectual disability 05/04/2020   Poor dentition 09/02/2017   Unintentional weight loss 07/26/2017   Constipation 11/10/2015   Long-term use of high-risk medication 10/11/2015   Xerosis of skin 12/12/2014   Restless legs syndrome 07/12/2014   Ingrown toenail without infection 12/15/2012   Pharyngitis 10/30/2012   Restlessness and agitation 09/10/2012   Routine general medical examination at a health care facility 06/08/2012   Cerebral palsy (HCC) 05/08/2012   Acneiform dermatitis 06/30/2011   Partial epilepsy with intractable epilepsy (HCC) 03/23/2011    Past Surgical History:  Procedure Laterality Date   ABDOMINAL HYSTERECTOMY  05/08/2003   BRAIN SURGERY     Temporal Lobe resection   CERUMEN REMOVAL Bilateral 03/01/2023   Procedure: CERUMEN REMOVAL;  Surgeon: Herminio Miu, MD;  Location: [facility];  Service: ENT;  Laterality: Bilateral;   CRANIOTOMY FOR TEMPORAL LOBECTOMY     EYE SURGERY  1993   TONSILLECTOMY AND ADENOIDECTOMY  age 89    OB History   No obstetric history on file.      Home Medications    Prior to Admission medications   Medication Sig Start Date End Date Taking? Authorizing Provider  benztropine (COGENTIN) 0.5 MG tablet Take 0.5 mg by mouth daily. 04/15/23  Yes [provider]  chlorhexidine  (PERIDEX ) 0.12 % solution AFTER BREAKFAST AND SUPPER, SWISH WITH (TO FILL LINE)  FOR 30 SECONDS AND SPIT OUT. 04/29/23  Yes Marylynn Verneita CROME, MD  diazepam  (DIASTAT  ACUDIAL) 10 MG GEL Place 20 mg rectally once.   Yes [provider]  diazepam  (VALIUM ) 2 MG tablet TAKE 1 TABLET BY MOUTH EVERY MORNING AND  3 TABLETS IN THE EVENING AS DIRECTED. 11/30/22  Yes Marylynn Verneita CROME, MD  EPINEPHrine  0.3 mg/0.3 mL IJ SOAJ injection INJECT 0.3 MG INTRAMUSCULARLY AS NEEDED FOR ANAPHYLAXIS 01/10/23  Yes Marylynn Verneita CROME, MD   hydrocortisone  cream 0.5 % Apply 1 Application topically 2 (two) times daily. 05/14/23  Yes Bernardino Ditch, NP  LAMICTAL  100 MG tablet Take 100 mg by mouth 2 (two) times daily.  08/03/16  Yes [provider]  levETIRAcetam  (KEPPRA ) 750 MG tablet Take 3 tablets (2,250 mg total) by mouth daily. TAKE 1 and a half pills twice a day 04/12/20  Yes Clapacs, John T, MD  melatonin 5 MG TABS Take 5 mg by mouth at bedtime.   Yes [provider]  PARoxetine  (PAXIL ) 30 MG tablet Take 1 tablet (30 mg total) by mouth 2 (two) times daily. 05/06/23  Yes Marylynn Verneita CROME, MD  QUEtiapine  (SEROQUEL ) 300 MG tablet Take 300 mg by mouth at bedtime. 10/22/22  Yes [provider]    Family History Family History  Problem Relation Age of Onset   Cancer Maternal Grandfather    Cancer Maternal Aunt     Social History Social History   Tobacco Use   Smoking status: Never   Smokeless tobacco: Never  Vaping Use   Vaping status: Never Used  Substance Use Topics   Alcohol use: Never   Drug use: No     Allergies   Other, Influenza vac split quad, Benadryl  [diphenhydramine  hcl], Montelukast , Ropinirole , Zyrtec [cetirizine hcl], and Peanut-containing drug products   Review of Systems Review of Systems  Constitutional:  Negative for fever.  HENT:  Positive for voice change. Negative for congestion and rhinorrhea.   Skin:  Positive for color change.     Physical Exam Triage Vital Signs ED Triage Vitals  Encounter Vitals Group     BP      Systolic BP Percentile      Diastolic BP Percentile      Pulse      Resp      Temp      Temp src      SpO2      Weight      Height      Head Circumference      Peak Flow      Pain Score      Pain Loc      Pain Education      Exclude from Growth Chart    No data found.  Updated Vital Signs BP 100/69 (BP Location: Right Arm)   Pulse 83   Temp 97.6 F (36.4 C) (Axillary)   Resp 16   Ht 5' 6 (1.676 m)   Wt 159 lb 3.2 oz (72.2 kg)    LMP 01/09/2011   SpO2 94%   BMI 25.70 kg/m   Visual Acuity Right Eye Distance:   Left Eye Distance:   Bilateral Distance:    Right Eye Near:   Left Eye Near:    Bilateral Near:     Physical Exam Vitals and nursing note reviewed.  Constitutional:      Appearance: Normal appearance.  HENT:     Head: Normocephalic and atraumatic.     Mouth/Throat:  Mouth: Mucous membranes are moist.     Pharynx: Oropharynx is clear. No oropharyngeal exudate or posterior oropharyngeal erythema.  Eyes:     General: No scleral icterus.       Right eye: No discharge.        Left eye: No discharge.     Extraocular Movements: Extraocular movements intact.     Conjunctiva/sclera: Conjunctivae normal.     Pupils: Pupils are equal, round, and reactive to light.  Cardiovascular:     Rate and Rhythm: Normal rate and regular rhythm.     Pulses: Normal pulses.     Heart sounds: Normal heart sounds. No murmur heard.    No friction rub. No gallop.  Pulmonary:     Effort: Pulmonary effort is normal.     Breath sounds: Normal breath sounds. No wheezing, rhonchi or rales.  Skin:    General: Skin is warm and dry.     Capillary Refill: Capillary refill takes less than 2 seconds.     Findings: Erythema and rash present.  Neurological:     Mental Status: She is alert.      UC Treatments / Results  Labs (all labs ordered are listed, but only abnormal results are displayed) Labs Reviewed - No data to display  EKG   Radiology No results found.  Procedures Procedures (including critical care time)  Medications Ordered in UC Medications - No data to display  Initial Impression / Assessment and Plan / UC Course  I have reviewed the triage vital signs and the nursing notes.  Pertinent labs & imaging results that were available during my care of the patient were reviewed by me and considered in my medical decision making (see chart for details).   Patient is a nontoxic-appearing 38 year old  female returning for evaluation of redness to the right temple region that was first noticed this morning by her mother.  As you can see in image above, there is erythema to the skin with some slight scaliness which is more consistent with an eczema presentation.  The area of erythema is not hot to touch, tender, swollen, indurated, or fluctuant.  Ocular exam reveals normal bulbar and labral conjunctiva.  Normal red light reflex in both eyes and both pupils are equal round and reactive.  Oropharyngeal exam is benign.  No cervical of adenopathy present.  Cardiopulmonary exam reveals S1-S2 heart sounds with regular rate and rhythm lung sounds are clear to auscultation all fields.  I suspect that this area of erythema is more related to eczema and the patient's dry skin and I we will use a topical low-dose hydrocortisone  0.5% twice daily to help with redness and inflammation.  If the redness increases, becomes swollen, becomes hot, or patient starts running fevers she should return for reevaluation.  Final Clinical Impressions(s) / UC Diagnoses   Final diagnoses:  Atopic dermatitis, unspecified type     Discharge Instructions      Apply the 0.5% hydrocortisone  cream twice daily to the area of redness on the side of Zykeria's face.  This should help with redness and irritation.  Monitor the area for any increased redness, swelling, heat, pain, or if Faythe starts to run a fever.  If any of these conditions arise please return for reevaluation or follow-up with your primary care provider.     ED Prescriptions     Medication Sig Dispense Auth. Provider   hydrocortisone  cream 0.5 % Apply 1 Application topically 2 (two) times daily. 30 g Bernardino Ditch,  NP      PDMP not reviewed this encounter.   Bernardino Ditch, NP 05/14/23 [phone]

## 2023-05-14 NOTE — Telephone Encounter (Signed)
 Currently at Urgent Care.

## 2023-05-28 ENCOUNTER — Other Ambulatory Visit: Payer: 59

## 2023-05-30 ENCOUNTER — Other Ambulatory Visit: Payer: Self-pay | Admitting: Internal Medicine

## 2023-05-31 ENCOUNTER — Other Ambulatory Visit (INDEPENDENT_AMBULATORY_CARE_PROVIDER_SITE_OTHER): Payer: 59

## 2023-05-31 DIAGNOSIS — Z5181 Encounter for therapeutic drug level monitoring: Secondary | ICD-10-CM

## 2023-05-31 DIAGNOSIS — E876 Hypokalemia: Secondary | ICD-10-CM | POA: Diagnosis not present

## 2023-05-31 LAB — BASIC METABOLIC PANEL WITH GFR
BUN: 10 mg/dL (ref 6–23)
CO2: 22 meq/L (ref 19–32)
Calcium: 9.5 mg/dL (ref 8.4–10.5)
Chloride: 106 meq/L (ref 96–112)
Creatinine, Ser: 0.52 mg/dL (ref 0.40–1.20)
GFR: 120.02 mL/min
Glucose, Bld: 94 mg/dL (ref 70–99)
Potassium: 3.7 meq/L (ref 3.5–5.1)
Sodium: 135 meq/L (ref 135–145)

## 2023-05-31 LAB — MAGNESIUM: Magnesium: 1.9 mg/dL (ref 1.5–2.5)

## 2023-06-01 ENCOUNTER — Encounter: Payer: Self-pay | Admitting: Internal Medicine

## 2023-06-06 ENCOUNTER — Ambulatory Visit (INDEPENDENT_AMBULATORY_CARE_PROVIDER_SITE_OTHER): Payer: 59 | Admitting: Nurse Practitioner

## 2023-06-06 VITALS — BP 120/74 | HR 78 | Temp 97.4°F | Ht 66.0 in | Wt 160.0 lb

## 2023-06-06 DIAGNOSIS — L309 Dermatitis, unspecified: Secondary | ICD-10-CM

## 2023-06-06 LAB — LAMOTRIGINE LEVEL: Lamotrigine Lvl: 6.5 ug/mL (ref 2.5–15.0)

## 2023-06-06 LAB — LEVETIRACETAM LEVEL: Keppra (Levetiracetam): 28.8 ug/mL

## 2023-06-06 MED ORDER — TRIAMCINOLONE ACETONIDE 0.5 % EX OINT: 1.0000 | TOPICAL_OINTMENT | Freq: Two times a day (BID) | CUTANEOUS | 0 refills | Status: DC

## 2023-06-06 NOTE — Progress Notes (Signed)
Established Patient Office Visit  Subjective:  Patient ID: Lindsey [name], female    DOB: Mar 17, 1986  Age: 38 y.o. MRN: [id number]  CC:  Chief Complaint  Patient presents with   Acute Visit    Hands red & skin peeling   Discussed the use of a AI scribe software for clinical note transcription with the patient, who gave verbal consent to proceed.  HPI  Lindsey [name] presents for acute visit with peeling and itchy skin on her hands accompanied with her mother.  She has been experiencing peeling and itchy skin on her hands since Monday, approximately four days ago. Initially, the symptoms began on one hand and have since spread to both hands. The skin is described as red, rough, and dry, with noticeable peeling. She has been applying hydrocortisone cream several times a day to alleviate the itching, which leads her to bite at her hands.  She has a history of dry skin but has never experienced peeling like this before. There have been no changes in soap or cleaning supplies, and she has not been washing her hands more frequently than usual. She uses Dove sensitive skin soap for bathing and washing clothes.  She has a known allergy to nuts and dogs, which causes her to break out in welts. She has not consumed nuts or been around dogs recently.  HPI   Past Medical History:  Diagnosis Date   Allergy 1991   Anxiety 2000   COVID-19 12/2020   Epilepsy [facility]) age 68   negative metabolic workup has brain abnormalities by MRI (Dr. Quintin Alto, [facility])   Hypertropia of left eye    Insomnia    Intellectual disability    Partial epilepsy with impairment of consciousness, intractable (HCC)    Psychomotor agitation    Seizures (HCC)    None since approx 2018   Status post VNS (vagus nerve stimulator) placement 05/07/1998   Temporal lobectomy behavior syndrome 05/07/1992   Right   Torticollis, ocular     Past Surgical History:  Procedure Laterality Date   ABDOMINAL HYSTERECTOMY   05/08/2003   BRAIN SURGERY     Temporal Lobe resection   CERUMEN REMOVAL Bilateral 03/01/2023   Procedure: CERUMEN REMOVAL;  Surgeon: Linus Salmons, MD;  Location: [facility];  Service: ENT;  Laterality: Bilateral;   CRANIOTOMY FOR TEMPORAL LOBECTOMY     EYE SURGERY  1993   TONSILLECTOMY AND ADENOIDECTOMY  age 51    Family History  Problem Relation Age of Onset   Cancer Maternal Grandfather    Cancer Maternal Aunt     Social History   Socioeconomic History   Marital status: Single    Spouse name: Not on file   Number of children: Not on file   Years of education: Not on file   Highest education level: 12th grade  Occupational History   Not on file  Tobacco Use   Smoking status: Never   Smokeless tobacco: Never  Vaping Use   Vaping status: Never Used  Substance and Sexual Activity   Alcohol use: Never   Drug use: No   Sexual activity: Never  Other Topics Concern   Not on file  Social History Narrative   Lives with mother   Social Drivers of Health   Financial Resource Strain: Low Risk  (06/06/2023)   Overall Financial Resource Strain (CARDIA)    Difficulty of Paying Living Expenses: Not hard at all  Food Insecurity: No Food Insecurity (06/06/2023)   Hunger  Vital Sign    Worried About Programme researcher, broadcasting/film/video in the Last Year: Never true    Ran Out of Food in the Last Year: Never true  Transportation Needs: No Transportation Needs (06/06/2023)   PRAPARE - Administrator, Civil Service (Medical): No    Lack of Transportation (Non-Medical): No  Physical Activity: Unknown (06/06/2023)   Exercise Vital Sign    Days of Exercise per Week: 0 days    Minutes of Exercise per Session: Not on file  Stress: No Stress Concern Present (06/06/2023)   [employer] of Occupational Health - Occupational Stress Questionnaire    Feeling of Stress : Not at all  Social Connections: Moderately Integrated (06/06/2023)   Social Connection and Isolation Panel [NHANES]     Frequency of Communication with Friends and Family: Three times a week    Frequency of Social Gatherings with Friends and Family: More than three times a week    Attends Religious Services: More than 4 times per year    Active Member of [employer] or Organizations: Yes    Attends Engineer, structural: More than 4 times per year    Marital Status: Never married  Intimate Partner Violence: Not At Risk (05/07/2022)   Humiliation, Afraid, Rape, and Kick questionnaire    Fear of Current or Ex-Partner: No    Emotionally Abused: No    Physically Abused: No    Sexually Abused: No     Outpatient Medications Prior to Visit  Medication Sig Dispense Refill   benztropine (COGENTIN) 0.5 MG tablet Take 0.5 mg by mouth daily.     chlorhexidine (PERIDEX) 0.12 % solution AFTER BREAKFAST AND SUPPER, SWISH WITH (TO FILL LINE) FOR 30 SECONDS AND SPIT OUT. 473 mL 0   diazepam (DIASTAT ACUDIAL) 10 MG GEL Place 20 mg rectally once.     diazepam (VALIUM) 2 MG tablet TAKE ONE TABLET BY MOUTH EVERY MORNING AND 3 TABLETS BY MOUTH IN THE EVENING ASDIRECTED 120 tablet 2   EPINEPHrine 0.3 mg/0.3 mL IJ SOAJ injection INJECT 0.3 MG INTRAMUSCULARLY AS NEEDED FOR ANAPHYLAXIS 2 each 0   hydrocortisone cream 0.5 % Apply 1 Application topically 2 (two) times daily. 30 g 0   LAMICTAL 100 MG tablet Take 100 mg by mouth 2 (two) times daily.      levETIRAcetam (KEPPRA) 750 MG tablet Take 3 tablets (2,250 mg total) by mouth daily. TAKE 1 and a half pills twice a day 90 tablet 0   melatonin 5 MG TABS Take 5 mg by mouth at bedtime.     PARoxetine (PAXIL) 30 MG tablet Take 1 tablet (30 mg total) by mouth 2 (two) times daily. 180 tablet 1   QUEtiapine (SEROQUEL) 300 MG tablet Take 300 mg by mouth at bedtime.     No facility-administered medications prior to visit.    Allergies  Allergen Reactions   Other Swelling     Allergic Reaction to all nuts/ingredients   Influenza Vac Split Quad Other (See Comments)    48  hours of fever, myalgias    Benadryl [Diphenhydramine Hcl]     Seizure   Montelukast Swelling   Ropinirole Swelling    Itching,    Zyrtec [Cetirizine Hcl]     Seizure    Peanut-Containing Drug Products Rash    ROS Review of Systems Negative unless indicated in HPI.    Objective:    Physical Exam Constitutional:      Appearance: Normal appearance.  Cardiovascular:  Rate and Rhythm: Normal rate and regular rhythm.     Pulses: Normal pulses.     Heart sounds: Normal heart sounds.  Musculoskeletal:     Cervical back: Normal range of motion.     Comments: Dry and peeling skin bilateral palm  Neurological:     Mental Status: She is alert.     BP 120/74   Pulse 78   Temp (!) 97.4 F (36.3 C)   Ht 5\' 6"  (1.676 m)   Wt 160 lb (72.6 kg)   LMP 01/09/2011   SpO2 96%   BMI 25.82 kg/m  Wt Readings from Last 3 Encounters:  06/06/23 160 lb (72.6 kg)  05/14/23 159 lb 3.2 oz (72.2 kg)  04/24/23 159 lb 3.2 oz (72.2 kg)     Health Maintenance  Topic Date Due   Pneumococcal Vaccine 85-58 Years old (1 of 2 - PCV) Never done   DTaP/Tdap/Td (1 - Tdap) Never done   Cervical Cancer Screening (HPV/Pap Cotest)  Never done   COVID-19 Vaccine (4 - 2024-25 season) 06/22/2023 (Originally 01/06/2023)   INFLUENZA VACCINE  08/05/2023 (Originally 12/06/2022)   Hepatitis C Screening  Completed   HIV Screening  Completed   HPV VACCINES  Aged Out    There are no preventive care reminders to display for this patient.  Lab Results  Component Value Date   TSH 1.16 08/09/2022   Lab Results  Component Value Date   WBC 4.6 04/11/2023   HGB 14.6 04/11/2023   HCT 41.7 04/11/2023   MCV 86.2 04/11/2023   PLT 256 04/11/2023   Lab Results  Component Value Date   NA 135 05/31/2023   K 3.7 05/31/2023   CO2 22 05/31/2023   GLUCOSE 94 05/31/2023   BUN 10 05/31/2023   CREATININE 0.52 05/31/2023   BILITOT 0.4 08/09/2022   ALKPHOS 100 08/09/2022   AST 18 08/09/2022   ALT 9 08/09/2022    PROT 7.0 08/09/2022   ALBUMIN 4.1 08/09/2022   CALCIUM 9.5 05/31/2023   ANIONGAP 10 04/11/2023   GFR 120.02 05/31/2023   Lab Results  Component Value Date   CHOL 129 03/19/2022   Lab Results  Component Value Date   HDL 32.30 (L) 03/19/2022   Lab Results  Component Value Date   LDLCALC 81 03/19/2022   Lab Results  Component Value Date   TRIG 78.0 03/19/2022   Lab Results  Component Value Date   CHOLHDL 4 03/19/2022   Lab Results  Component Value Date   HGBA1C 4.4 (L) 08/09/2022      Assessment & Plan:  Dermatitis Assessment & Plan: Skin peeling and itching on hands since Monday. No new exposures or changes in soap. Using hydrocortisone cream with some relief. Dry skin all over. -Prescribe Triamcinolone cream, apply twice daily. -Continue moisturizing skin regularly. -If no improvement, patient to notify office.   Other orders -     Triamcinolone Acetonide; Apply 1 Application topically 2 (two) times daily.  Dispense: 15 g; Refill: 0    Follow-up: No follow-ups on file.   Kara Dies, NP

## 2023-06-07 ENCOUNTER — Encounter: Payer: Self-pay | Admitting: Nurse Practitioner

## 2023-06-07 DIAGNOSIS — L309 Dermatitis, unspecified: Secondary | ICD-10-CM | POA: Insufficient documentation

## 2023-06-07 NOTE — Assessment & Plan Note (Signed)
Skin peeling and itching on hands since Monday. No new exposures or changes in soap. Using hydrocortisone cream with some relief. Dry skin all over. -Prescribe Triamcinolone cream, apply twice daily. -Continue moisturizing skin regularly. -If no improvement, patient to notify office.

## 2023-07-15 ENCOUNTER — Encounter: Payer: Self-pay | Admitting: Internal Medicine

## 2023-07-17 ENCOUNTER — Encounter: Payer: Self-pay | Admitting: Internal Medicine

## 2023-07-17 ENCOUNTER — Ambulatory Visit (INDEPENDENT_AMBULATORY_CARE_PROVIDER_SITE_OTHER): Admitting: Internal Medicine

## 2023-07-17 ENCOUNTER — Other Ambulatory Visit
Admission: AD | Admit: 2023-07-17 | Discharge: 2023-07-17 | Disposition: A | Discharge status: Home / Self Care | Source: Ambulatory Visit | Attending: Internal Medicine | Admitting: Internal Medicine

## 2023-07-17 VITALS — BP 116/66 | HR 117 | Ht 66.0 in | Wt 159.4 lb

## 2023-07-17 DIAGNOSIS — R197 Diarrhea, unspecified: Secondary | ICD-10-CM

## 2023-07-17 LAB — C DIFFICILE QUICK SCREEN W PCR REFLEX
C Diff antigen: NEGATIVE
C Diff interpretation: NOT DETECTED
C Diff toxin: NEGATIVE

## 2023-07-17 NOTE — Progress Notes (Unsigned)
 Subjective:  Patient ID: Lindsey [name], female    DOB: 1985/12/25  Age: 38 y.o. MRN: [id number]  CC: There were no encounter diagnoses.   HPI [name] presents for  Chief Complaint  Patient presents with   watery stools for 2 to 3 days    Lindsey [name] is a 38 yr old woman with cerebral palsy , seizure disorder  and intellectual disability who brought in by her mother today for evaluation of watery stools  that have been occurring for one week.  She has no history of precedent antibiotic use.   She has had a sick contact at her new daycare.  No blood in stools,  fevers,  or nausea/vomiting .  Appetite has been diminished but she is eating .  The frequency of loose stools has improved ;  she has only had one today.  Mother has been unable to restrict her diet due to her intellectual/emotional age,  and has not tried Imodium yet    Outpatient Medications Prior to Visit  Medication Sig Dispense Refill   benztropine (COGENTIN) 0.5 MG tablet Take 0.5 mg by mouth daily.     chlorhexidine (PERIDEX) 0.12 % solution AFTER BREAKFAST AND SUPPER, SWISH WITH (TO FILL LINE) FOR 30 SECONDS AND SPIT OUT. 473 mL 0   diazepam (DIASTAT ACUDIAL) 10 MG GEL Place 20 mg rectally once.     diazepam (VALIUM) 2 MG tablet TAKE ONE TABLET BY MOUTH EVERY MORNING AND 3 TABLETS BY MOUTH IN THE EVENING ASDIRECTED 120 tablet 2   EPINEPHrine 0.3 mg/0.3 mL IJ SOAJ injection INJECT 0.3 MG INTRAMUSCULARLY AS NEEDED FOR ANAPHYLAXIS 2 each 0   hydrocortisone cream 0.5 % Apply 1 Application topically 2 (two) times daily. 30 g 0   LAMICTAL 100 MG tablet Take 100 mg by mouth 2 (two) times daily.      levETIRAcetam (KEPPRA) 750 MG tablet Take 3 tablets (2,250 mg total) by mouth daily. TAKE 1 and a half pills twice a day 90 tablet 0   melatonin 5 MG TABS Take 5 mg by mouth at bedtime.     PARoxetine (PAXIL) 30 MG tablet Take 1 tablet (30 mg total) by mouth 2 (two) times daily. 180 tablet 1   QUEtiapine (SEROQUEL)  300 MG tablet Take 300 mg by mouth at bedtime.     triamcinolone ointment (KENALOG) 0.5 % Apply 1 Application topically 2 (two) times daily. 15 g 0   No facility-administered medications prior to visit.    Review of Systems;  Patient denies headache, fevers, malaise, unintentional weight loss, skin rash, eye pain, sinus congestion and sinus pain, sore throat, dysphagia,  hemoptysis , cough, dyspnea, wheezing, chest pain, palpitations, orthopnea, edema, abdominal pain, nausea, melena, diarrhea, constipation, flank pain, dysuria, hematuria, urinary  Frequency, nocturia, numbness, tingling, seizures,  Focal weakness, Loss of consciousness,  Tremor, insomnia, depression, anxiety, and suicidal ideation.      Objective:  LMP 01/09/2011   BP Readings from Last 3 Encounters:  06/06/23 120/74  05/14/23 100/69  04/24/23 104/72    Wt Readings from Last 3 Encounters:  06/06/23 160 lb (72.6 kg)  05/14/23 159 lb 3.2 oz (72.2 kg)  04/24/23 159 lb 3.2 oz (72.2 kg)    Physical Exam Vitals reviewed.  Constitutional:      General: She is not in acute distress.    Appearance: Normal appearance. She is normal weight. She is not ill-appearing, toxic-appearing or diaphoretic.  HENT:     Head: Normocephalic.  Eyes:     General: No scleral icterus.       Right eye: No discharge.        Left eye: No discharge.     Conjunctiva/sclera: Conjunctivae normal.  Cardiovascular:     Rate and Rhythm: Normal rate and regular rhythm.     Heart sounds: Normal heart sounds.  Pulmonary:     Effort: Pulmonary effort is normal. No respiratory distress.     Breath sounds: Normal breath sounds.  Musculoskeletal:        General: Normal range of motion.  Skin:    General: Skin is warm and dry.  Neurological:     General: No focal deficit present.     Mental Status: She is alert and oriented to person, place, and time. Mental status is at baseline.  Psychiatric:        Mood and Affect: Mood normal.         Behavior: Behavior normal.        Thought Content: Thought content normal.        Judgment: Judgment normal.     Lab Results  Component Value Date   HGBA1C 4.4 (L) 08/09/2022   HGBA1C 4.7 03/19/2022   HGBA1C 4.9 03/16/2021    Lab Results  Component Value Date   CREATININE 0.52 05/31/2023   CREATININE 0.69 04/11/2023   CREATININE 0.64 08/09/2022    Lab Results  Component Value Date   WBC 4.6 04/11/2023   HGB 14.6 04/11/2023   HCT 41.7 04/11/2023   PLT 256 04/11/2023   GLUCOSE 94 05/31/2023   CHOL 129 03/19/2022   TRIG 78.0 03/19/2022   HDL 32.30 (L) 03/19/2022   LDLCALC 81 03/19/2022   ALT 9 08/09/2022   AST 18 08/09/2022   NA 135 05/31/2023   K 3.7 05/31/2023   CL 106 05/31/2023   CREATININE 0.52 05/31/2023   BUN 10 05/31/2023   CO2 22 05/31/2023   TSH 1.16 08/09/2022   INR 1.2 05/08/2022   HGBA1C 4.4 (L) 08/09/2022    No results found.  Assessment & Plan:  .There are no diagnoses linked to this encounter.   I spent 34 minutes on the day of this face to face encounter reviewing patient's  most recent visit with cardiology,  nephrology,  and neurology,  prior relevant surgical and non surgical procedures, recent  labs and imaging studies, counseling on weight management,  reviewing the assessment and plan with patient, and post visit ordering and reviewing of  diagnostics and therapeutics with patient  .   Follow-up: No follow-ups on file.   Sherlene Shams, MD

## 2023-07-18 ENCOUNTER — Encounter: Payer: Self-pay | Admitting: Internal Medicine

## 2023-07-18 DIAGNOSIS — R197 Diarrhea, unspecified: Secondary | ICD-10-CM | POA: Insufficient documentation

## 2023-07-18 NOTE — Assessment & Plan Note (Signed)
 C dif ruled out .  GI Pathogen panel pending  .  Rec use of Imodium and stricter diet to avoid intestinal irritants.

## 2023-07-19 LAB — GI PATHOGEN PANEL BY PCR, STOOL

## 2023-07-29 ENCOUNTER — Other Ambulatory Visit: Payer: Self-pay | Admitting: Internal Medicine

## 2023-07-30 NOTE — Telephone Encounter (Signed)
 Refilled: 05/31/2023 Last OV: 04/24/2023 Next OV: 04/27/2024

## 2023-08-07 ENCOUNTER — Other Ambulatory Visit: Payer: Self-pay | Admitting: Internal Medicine

## 2023-08-08 ENCOUNTER — Other Ambulatory Visit: Payer: Self-pay | Admitting: Internal Medicine

## 2023-08-19 ENCOUNTER — Encounter: Payer: Self-pay | Admitting: Internal Medicine

## 2023-08-19 DIAGNOSIS — G2581 Restless legs syndrome: Secondary | ICD-10-CM

## 2023-08-19 DIAGNOSIS — R451 Restlessness and agitation: Secondary | ICD-10-CM

## 2023-09-09 ENCOUNTER — Ambulatory Visit (INDEPENDENT_AMBULATORY_CARE_PROVIDER_SITE_OTHER): Admitting: Internal Medicine

## 2023-09-09 ENCOUNTER — Other Ambulatory Visit

## 2023-09-09 ENCOUNTER — Encounter: Payer: Self-pay | Admitting: Internal Medicine

## 2023-09-09 ENCOUNTER — Other Ambulatory Visit (INDEPENDENT_AMBULATORY_CARE_PROVIDER_SITE_OTHER)

## 2023-09-09 VITALS — BP 128/70 | HR 108 | Ht 66.0 in | Wt 164.6 lb

## 2023-09-09 DIAGNOSIS — G2581 Restless legs syndrome: Secondary | ICD-10-CM

## 2023-09-09 DIAGNOSIS — R451 Restlessness and agitation: Secondary | ICD-10-CM

## 2023-09-09 NOTE — Patient Instructions (Addendum)
 We need to rule out iron deficiency as a cause for Lindsey [name]'s restless legs.    If the iron is low we will replace,  but if not,  I will recommend increasing her evening dose of valium  to 8 mg

## 2023-09-09 NOTE — Progress Notes (Unsigned)
 Subjective:  Patient ID: Lindsey [name], female    DOB: [birth year]  Age: 38 y.o. MRN: [id number]  CC: There were no encounter diagnoses.   HPI TYJUANA SOTOLONGO presents for  Chief Complaint  Patient presents with   Medical Management of Chronic Issues    Follow up on restless leg and trouble sleeping    RESTLESS LEGS:  taking benzotropine, dose recently increased to 1 mg  at 8 pm last month for anxiety by psychiatry ,  helping a little with the anxiety .  Patient has been very results, , legs    are constantly  moving  even when asleep .  Mother has been giving her 100 mg magnesium  to 200 mg at night which is helping her constipation but not with the agitation  Current using 2 mg valium  in the am  6 mg at 7pm    Outpatient Medications Prior to Visit  Medication Sig Dispense Refill   ALPRAZolam  (XANAX ) 0.25 MG tablet TAKE ONE-HALF TABLET BY MOUTH EVERY MORNING AND TAKE ONE TABLET IN THE EVENING 45 tablet 2   benztropine (COGENTIN) 1 MG tablet Take 1 mg by mouth daily.     chlorhexidine  (PERIDEX ) 0.12 % solution AFTER BREAKFAST AND SUPPER, SWISH WITH (TO FILL LINE) FOR 30 SECONDS AND SPIT OUT. 473 mL 0   diazepam  (DIASTAT  ACUDIAL) 10 MG GEL Place 20 mg rectally once.     diazepam  (VALIUM ) 2 MG tablet TAKE ONE TABLET BY MOUTH EVERY MORNING AND 3 TABLETS BY MOUTH IN THE EVENING ASDIRECTED 120 tablet 3   EPINEPHrine  0.3 mg/0.3 mL IJ SOAJ injection INJECT 0.3 MG INTRAMUSCULARLY AS NEEDED FOR ANAPHYLAXIS 2 each 0   LAMICTAL  100 MG tablet Take 100 mg by mouth 2 (two) times daily.      levETIRAcetam  (KEPPRA ) 750 MG tablet Take 3 tablets (2,250 mg total) by mouth daily. TAKE 1 and a half pills twice a day 90 tablet 0   Magnesium  Glycinate 100 MG CAPS Take 1 capsule by mouth at bedtime.     melatonin 5 MG TABS Take 5 mg by mouth at bedtime.     PARoxetine  (PAXIL ) 30 MG tablet Take 1 tablet (30 mg total) by mouth 2 (two) times daily. 180 tablet 1   QUEtiapine  (SEROQUEL ) 300 MG tablet  Take 300 mg by mouth at bedtime.     benztropine (COGENTIN) 0.5 MG tablet Take 0.5 mg by mouth daily. (Patient not taking: Reported on 09/09/2023)     No facility-administered medications prior to visit.    Review of Systems;  Patient denies headache, fevers, malaise, unintentional weight loss, skin rash, eye pain, sinus congestion and sinus pain, sore throat, dysphagia,  hemoptysis , cough, dyspnea, wheezing, chest pain, palpitations, orthopnea, edema, abdominal pain, nausea, melena, diarrhea, constipation, flank pain, dysuria, hematuria, urinary  Frequency, nocturia, numbness, tingling, seizures,  Focal weakness, Loss of consciousness,  Tremor, insomnia, depression, anxiety, and suicidal ideation.      Objective:  BP 128/70   Pulse (!) 108   Ht 5\' 6"  (1.676 m)   Wt 164 lb 9.6 oz (74.7 kg)   LMP 01/09/2011   SpO2 96%   BMI 26.57 kg/m   BP Readings from Last 3 Encounters:  09/09/23 128/70  07/17/23 116/66  06/06/23 120/74    Wt Readings from Last 3 Encounters:  09/09/23 164 lb 9.6 oz (74.7 kg)  07/17/23 159 lb 6.4 oz (72.3 kg)  06/06/23 160 lb (72.6 kg)    Physical Exam  Lab Results  Component Value Date   HGBA1C 4.4 (L) 08/09/2022   HGBA1C 4.7 03/19/2022   HGBA1C 4.9 03/16/2021    Lab Results  Component Value Date   CREATININE 0.52 05/31/2023   CREATININE 0.69 04/11/2023   CREATININE 0.64 08/09/2022    Lab Results  Component Value Date   WBC 4.6 04/11/2023   HGB 14.6 04/11/2023   HCT 41.7 04/11/2023   PLT 256 04/11/2023   GLUCOSE 94 05/31/2023   CHOL 129 03/19/2022   TRIG 78.0 03/19/2022   HDL 32.30 (L) 03/19/2022   LDLCALC 81 03/19/2022   ALT 9 08/09/2022   AST 18 08/09/2022   NA 135 05/31/2023   K 3.7 05/31/2023   CL 106 05/31/2023   CREATININE 0.52 05/31/2023   BUN 10 05/31/2023   CO2 22 05/31/2023   TSH 1.16 08/09/2022   INR 1.2 05/08/2022   HGBA1C 4.4 (L) 08/09/2022    No results found.  Assessment & Plan:  .There are no diagnoses  linked to this encounter.   I spent 34 minutes on the day of this face to face encounter reviewing patient's  most recent visit with cardiology,  nephrology,  and neurology,  prior relevant surgical and non surgical procedures, recent  labs and imaging studies, counseling on weight management,  reviewing the assessment and plan with patient, and post visit ordering and reviewing of  diagnostics and therapeutics with patient  .   Follow-up: No follow-ups on file.   Thersia Flax, MD

## 2023-09-10 ENCOUNTER — Encounter: Payer: Self-pay | Admitting: Internal Medicine

## 2023-09-10 LAB — URINALYSIS, ROUTINE W REFLEX MICROSCOPIC
Bilirubin Urine: NEGATIVE
Ketones, ur: NEGATIVE
Leukocytes,Ua: NEGATIVE
Nitrite: NEGATIVE
Specific Gravity, Urine: 1.025 (ref 1.000–1.030)
Urine Glucose: NEGATIVE
Urobilinogen, UA: 0.2 (ref 0.0–1.0)
pH: 6 (ref 5.0–8.0)

## 2023-09-10 LAB — IBC + FERRITIN
Ferritin: 33.3 ng/mL (ref 10.0–291.0)
Iron: 57 ug/dL (ref 42–145)
Saturation Ratios: 21.5 % (ref 20.0–50.0)
TIBC: 264.6 ug/dL (ref 250.0–450.0)
Transferrin: 189 mg/dL — ABNORMAL LOW (ref 212.0–360.0)

## 2023-09-10 LAB — URINE CULTURE
MICRO NUMBER:: 16413036
Result:: NO GROWTH
SPECIMEN QUALITY:: ADEQUATE

## 2023-09-10 LAB — BASIC METABOLIC PANEL WITH GFR
BUN: 7 mg/dL (ref 6–23)
CO2: 25 meq/L (ref 19–32)
Calcium: 9.6 mg/dL (ref 8.4–10.5)
Chloride: 104 meq/L (ref 96–112)
Creatinine, Ser: 0.62 mg/dL (ref 0.40–1.20)
GFR: 114.81 mL/min (ref >60.00)
Glucose, Bld: 92 mg/dL (ref 70–99)
Potassium: 3.8 meq/L (ref 3.5–5.1)
Sodium: 138 meq/L (ref 135–145)

## 2023-09-10 LAB — MAGNESIUM: Magnesium: 1.9 mg/dL (ref 1.5–2.5)

## 2023-09-10 NOTE — Telephone Encounter (Signed)
 Folic acid  is not in pt's current medication list.

## 2023-09-10 NOTE — Assessment & Plan Note (Addendum)
 Chronic problem.  Iron stores are normal.   Trial of requip   and mirapexd were not tolerated.  Will increase evening dose of valium  to 8 mg

## 2023-09-11 MED ORDER — FOLIC ACID 1 MG PO TABS: 1.0000 mg | ORAL_TABLET | Freq: Every day | ORAL | 3 refills | Status: AC

## 2023-09-11 MED ORDER — DIAZEPAM 2 MG PO TABS: ORAL_TABLET | ORAL | 3 refills | Status: DC

## 2023-09-13 ENCOUNTER — Telehealth: Payer: Self-pay | Admitting: Internal Medicine

## 2023-09-13 NOTE — Telephone Encounter (Signed)
Placed in red folder for completion.  

## 2023-09-13 NOTE — Telephone Encounter (Signed)
 Patient's mother dropped of a physician's order form to be complete by Dr Madelon Scheuermann. Form is up front in Dr. Berl Breed color folder.

## 2023-09-18 NOTE — Telephone Encounter (Signed)
 LM letting pt's mother know that form is ready for pick up.

## 2023-09-27 ENCOUNTER — Other Ambulatory Visit: Payer: Self-pay | Admitting: Internal Medicine

## 2023-10-08 ENCOUNTER — Ambulatory Visit: Admitting: Internal Medicine

## 2023-10-15 ENCOUNTER — Ambulatory Visit: Payer: Self-pay

## 2023-10-15 ENCOUNTER — Ambulatory Visit
Admission: EM | Admit: 2023-10-15 | Discharge: 2023-10-15 | Disposition: A | Discharge status: Home / Self Care | Attending: Emergency Medicine | Admitting: Emergency Medicine

## 2023-10-15 DIAGNOSIS — R82998 Other abnormal findings in urine: Secondary | ICD-10-CM | POA: Insufficient documentation

## 2023-10-15 DIAGNOSIS — R051 Acute cough: Secondary | ICD-10-CM | POA: Insufficient documentation

## 2023-10-15 LAB — POCT URINALYSIS DIP (MANUAL ENTRY)
Bilirubin, UA: NEGATIVE
Glucose, UA: NEGATIVE mg/dL
Ketones, POC UA: NEGATIVE mg/dL
Nitrite, UA: NEGATIVE
Protein Ur, POC: 30 mg/dL — AB
Spec Grav, UA: 1.02
Urobilinogen, UA: 0.2 U/dL
pH, UA: 7

## 2023-10-15 LAB — POC COVID19/FLU A&B COMBO
Covid Antigen, POC: NEGATIVE
Influenza A Antigen, POC: NEGATIVE
Influenza B Antigen, POC: NEGATIVE

## 2023-10-15 MED ORDER — CEPHALEXIN 500 MG PO CAPS: 500.0000 mg | ORAL_CAPSULE | Freq: Three times a day (TID) | ORAL | 0 refills | Status: AC

## 2023-10-15 NOTE — ED Provider Notes (Signed)
 [facility]    CSN: [id number] Arrival date & time: 10/15/23  4782      History   Chief Complaint Chief Complaint  Patient presents with   Fever    HPI Lindsey [name] is a 38 y.o. female.  Accompanied by her mother, patient presents with low-grade fever and cough since this morning.  Tmax 99.7.  Ibuprofen  given at 0630.  No rash, this of breath, vomiting, or diarrhea.  Mother reports patient was pulling at her underwear some yesterday.  The history is provided by a parent, the patient and medical records.    Past Medical History:  Diagnosis Date   Allergy 1991   Anxiety 2000   COVID-19 12/2020   Epilepsy [facility]) age 67   negative metabolic workup has brain abnormalities by MRI (Dr. Olga Berthold, [facility])   Hypertropia of left eye    Insomnia    Intellectual disability    Partial epilepsy with impairment of consciousness, intractable (HCC)    Psychomotor agitation    Seizures (HCC)    None since approx 2018   Status post VNS (vagus nerve stimulator) placement 05/07/1998   Temporal lobectomy behavior syndrome 05/07/1992   Right   Torticollis, ocular     Patient Active Problem List   Diagnosis Date Noted   Diarrhea of presumed infectious origin 07/18/2023   Dermatitis 06/07/2023   Rash 01/17/2023   Otalgia of right ear 01/17/2023   Heat rash 11/14/2022   Paronychia of right middle finger 09/24/2022   Folic acid  deficiency (non anemic) 08/12/2022   Hospital discharge follow-up 05/31/2022   Anxiety and depression 05/07/2022   Seizure disorder (HCC) 05/07/2022   Pneumonia of left lower lobe due to infectious organism 05/07/2022   Leukopenia 03/27/2022   Anticonvulsant-induced pancytopenia (HCC) 03/27/2022   Recurrent epistaxis 02/09/2022   Allergic rhinitis due to grass pollen 07/10/2021   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   COVID-19 12/21/2020   Drug-induced leukopenia (HCC) 06/29/2020   Change in behavior 05/04/2020   Moderate  intellectual disability 05/04/2020   Poor dentition 09/02/2017   Constipation 11/10/2015   Long-term use of high-risk medication 10/11/2015   Xerosis of skin 12/12/2014   Restless legs syndrome 07/12/2014   Ingrown toenail without infection 12/15/2012   Pharyngitis 10/30/2012   Restlessness and agitation 09/10/2012   Routine general medical examination at a health care facility 06/08/2012   Cerebral palsy (HCC) 05/08/2012   Acneiform dermatitis 06/30/2011   Partial epilepsy with intractable epilepsy (HCC) 03/23/2011    Past Surgical History:  Procedure Laterality Date   ABDOMINAL HYSTERECTOMY  05/08/2003   BRAIN SURGERY     Temporal Lobe resection   CERUMEN REMOVAL Bilateral 03/01/2023   Procedure: CERUMEN REMOVAL;  Surgeon: Lesly Raspberry, MD;  Location: [facility];  Service: ENT;  Laterality: Bilateral;   CRANIOTOMY FOR TEMPORAL LOBECTOMY     EYE SURGERY  1993   TONSILLECTOMY AND ADENOIDECTOMY  age 40    OB History   No obstetric history on file.      Home Medications    Prior to Admission medications   Medication Sig Start Date End Date Taking? Authorizing Provider  cephALEXin  (KEFLEX ) 500 MG capsule Take 1 capsule (500 mg total) by mouth 3 (three) times daily for 5 days. 10/15/23 10/20/23 Yes Wellington Half, NP  ALPRAZolam  (XANAX ) 0.25 MG tablet TAKE ONE-HALF TABLET BY MOUTH EVERY MORNING AND TAKE ONE TABLET IN THE EVENING 08/08/23   Thersia Flax, MD  benztropine (COGENTIN) 0.5  MG tablet Take 0.5 mg by mouth daily. Patient not taking: Reported on 09/09/2023 04/15/23   [provider]  benztropine (COGENTIN) 1 MG tablet Take 1 mg by mouth daily. 08/20/23   [provider]  chlorhexidine  (PERIDEX ) 0.12 % solution AFTER BREAKFAST AND SUPPER, SWISH WITH (TO FILL LINE) FOR 30 SECONDS AND SPIT OUT. 04/29/23   Thersia Flax, MD  diazepam  (DIASTAT  ACUDIAL) 10 MG GEL Place 20 mg rectally once.    [provider]  diazepam  (VALIUM ) 2 MG  tablet TAKE ONE TABLET BY MOUTH EVERY MORNING AND 4 TABLETS BY MOUTH IN THE EVENING ASDIRECTED 09/11/23   Thersia Flax, MD  EPINEPHrine  0.3 mg/0.3 mL IJ SOAJ injection INJECT 0.3 MG INTRAMUSCULARLY AS NEEDED FOR ANAPHYLAXIS 01/10/23   Thersia Flax, MD  folic acid  (FOLVITE ) 1 MG tablet Take 1 tablet (1 mg total) by mouth daily. 09/11/23   Thersia Flax, MD  LAMICTAL  100 MG tablet Take 100 mg by mouth 2 (two) times daily.  08/03/16   [provider]  levETIRAcetam  (KEPPRA ) 750 MG tablet Take 3 tablets (2,250 mg total) by mouth daily. TAKE 1 and a half pills twice a day 04/12/20   Clapacs, Elida Grounds, MD  Magnesium  Glycinate 100 MG CAPS Take 1 capsule by mouth at bedtime.    [provider]  melatonin 5 MG TABS Take 5 mg by mouth at bedtime.    [provider]  PARoxetine  (PAXIL ) 30 MG tablet TAKE ONE TABLET TWICE DAILY 09/27/23   Tullo, Teresa L, MD  QUEtiapine  (SEROQUEL ) 300 MG tablet Take 300 mg by mouth at bedtime. 10/22/22   [provider]    Family History Family History  Problem Relation Age of Onset   Cancer Maternal Grandfather    Cancer Maternal Aunt     Social History Social History   Tobacco Use   Smoking status: Never   Smokeless tobacco: Never  Vaping Use   Vaping status: Never Used  Substance Use Topics   Alcohol use: Never   Drug use: No     Allergies   Other, Influenza vac split quad, Benadryl  [diphenhydramine  hcl], Montelukast , Ropinirole , Zyrtec [cetirizine hcl], and Peanut-containing drug products   Review of Systems Review of Systems  Constitutional:  Positive for fever. Negative for activity change and appetite change.  HENT:  Negative for ear discharge and trouble swallowing.   Respiratory:  Positive for cough. Negative for shortness of breath.   Gastrointestinal:  Negative for diarrhea and vomiting.     Physical Exam Triage Vital Signs ED Triage Vitals [10/15/23 0837]  Encounter Vitals Group     BP 120/80      Systolic BP Percentile      Diastolic BP Percentile      Pulse Rate (!) 118     Resp 20     Temp 99.5 F (37.5 C)     Temp src      SpO2 100 %     Weight      Height      Head Circumference      Peak Flow      Pain Score      Pain Loc      Pain Education      Exclude from Growth Chart    No data found.  Updated Vital Signs BP 120/80   Pulse (!) 118   Temp 99.5 F (37.5 C)   Resp 20   LMP 01/09/2011   SpO2  100%   Visual Acuity Right Eye Distance:   Left Eye Distance:   Bilateral Distance:    Right Eye Near:   Left Eye Near:    Bilateral Near:     Physical Exam Constitutional:      General: She is not in acute distress.    Comments: Patient is agitated and mildly uncooperative.  HENT:     Right Ear: Tympanic membrane normal.     Left Ear: Tympanic membrane normal.     Nose: Nose normal.     Mouth/Throat:     Mouth: Mucous membranes are moist.     Pharynx: Oropharynx is clear.  Cardiovascular:     Rate and Rhythm: Normal rate and regular rhythm.     Heart sounds: Normal heart sounds.  Pulmonary:     Effort: Pulmonary effort is normal. No respiratory distress.     Breath sounds: Normal breath sounds.  Abdominal:     General: Bowel sounds are normal.     Palpations: Abdomen is soft.  Neurological:     Mental Status: She is alert.      UC Treatments / Results  Labs (all labs ordered are listed, but only abnormal results are displayed) Labs Reviewed  POCT URINALYSIS DIP (MANUAL ENTRY) - Abnormal; Notable for the following components:      Result Value   Blood, UA small (*)    Protein Ur, POC =30 (*)    Leukocytes, UA Trace (*)    All other components within normal limits  POC COVID19/FLU A&B COMBO - Normal  URINE CULTURE    EKG   Radiology No results found.  Procedures Procedures (including critical care time)  Medications Ordered in UC Medications - No data to display  Initial Impression / Assessment and Plan / UC Course  I have  reviewed the triage vital signs and the nursing notes.  Pertinent labs & imaging results that were available during my care of the patient were reviewed by me and considered in my medical decision making (see chart for details).   Leukocytes in urine, cough.  Temp 99.5; ibuprofen  given at home this morning.  Patient is irritable, agitated, mildly uncooperative during exam.  This is not her baseline.  Lungs are clear and O2 sat is 100% on room air.  COVID and flu negative.  Urine has trace leukocytes and small blood.  Treating today with cephalexin .  Urine culture pending.  Discussed with Raziah's mother that we will call if the culture shows the need to discontinue or change the antibiotic.  Instructed her to follow-up with her PCP.  Education provided on cough.  Mother agrees to plan of care.  Final Clinical Impressions(s) / UC Diagnoses   Final diagnoses:  Leukocytes in urine  Acute cough     Discharge Instructions      Give your daughter the antibiotic as directed.  The urine culture is pending.  We will call you if it shows the need to change or discontinue your antibiotic.    Follow-up with her primary care provider.    ED Prescriptions     Medication Sig Dispense Auth. Provider   cephALEXin  (KEFLEX ) 500 MG capsule Take 1 capsule (500 mg total) by mouth 3 (three) times daily for 5 days. 15 capsule Wellington Half, NP      PDMP not reviewed this encounter.   Wellington Half, NP 10/15/23 [phone]

## 2023-10-15 NOTE — ED Triage Notes (Signed)
 Patient to Urgent Care with mom, complaints of fevers (max 99.7)/ hacking cough. Symptoms started around 5am.   Taking ibuprofen  (last dose at 6:30am). Increased fluids intake.

## 2023-10-15 NOTE — Discharge Instructions (Addendum)
 Give your daughter the antibiotic as directed.  The urine culture is pending.  We will call you if it shows the need to change or discontinue your antibiotic.    Follow-up with her primary care provider.

## 2023-10-16 LAB — URINE CULTURE: Culture: NO GROWTH

## 2023-10-17 ENCOUNTER — Ambulatory Visit (HOSPITAL_COMMUNITY): Payer: Self-pay

## 2023-10-22 ENCOUNTER — Other Ambulatory Visit: Payer: Self-pay | Admitting: Internal Medicine

## 2023-10-29 ENCOUNTER — Encounter: Payer: Self-pay | Admitting: Internal Medicine

## 2023-10-30 ENCOUNTER — Encounter: Payer: Self-pay | Admitting: Internal Medicine

## 2023-10-30 ENCOUNTER — Telehealth (INDEPENDENT_AMBULATORY_CARE_PROVIDER_SITE_OTHER): Admitting: Internal Medicine

## 2023-10-30 VITALS — BP 120/80 | Ht 66.0 in | Wt 164.0 lb

## 2023-10-30 DIAGNOSIS — G2589 Other specified extrapyramidal and movement disorders: Secondary | ICD-10-CM | POA: Diagnosis not present

## 2023-10-30 DIAGNOSIS — T50905A Adverse effect of unspecified drugs, medicaments and biological substances, initial encounter: Secondary | ICD-10-CM | POA: Diagnosis not present

## 2023-10-30 DIAGNOSIS — R451 Restlessness and agitation: Secondary | ICD-10-CM

## 2023-10-30 MED ORDER — DIAZEPAM 2 MG PO TABS: 2.0000 mg | ORAL_TABLET | Freq: Every morning | ORAL | 5 refills | Status: DC

## 2023-10-30 MED ORDER — DIAZEPAM 10 MG PO TABS: 10.0000 mg | ORAL_TABLET | Freq: Every evening | ORAL | 2 refills | Status: DC

## 2023-10-30 NOTE — Telephone Encounter (Signed)
Pt has been scheduled for today at 4:30. °

## 2023-10-30 NOTE — Assessment & Plan Note (Signed)
 She has a Parkinsonian type tremor of both hands,  Likely from use of 2nd generation antipsychotics which are necessary to manage her behavioral issues.    Continue cogentin and will use valium  to control agitation.

## 2023-10-30 NOTE — Progress Notes (Signed)
 Virtual Visit via Caregility   Note   This format is felt to be most appropriate for this patient at this time.  All issues noted in this document were discussed and addressed.  No physical exam was performed (except for noted visual exam findings with Video Visits).   I connected with Lindsey [name] and Lindsey [name] (her mother)  on 10/30/23 at  4:30 PM EDT by a video enabled telemedicine application  and verified that I am speaking with the correct person using two identifiers. Location patient: home Location provider: work or home office Persons participating in the virtual visit: patient, provider and patient's mother Lindsey [name]   I discussed the limitations, risks, security and privacy concerns of performing an evaluation and management service by telephone and the availability of in person appointments. I also discussed with the patient that there may be a patient responsible charge related to this service. The patient expressed understanding and agreed to proceed.  Interactive audio and video telecommunications were attempted between this provider and patient, however failed, due to patient having technical difficulties OR patient did not have access to video capability.  We continued and completed visit with audio only.   Reason for visit: nighttime agitation   HPI:  Lindsey [name] is a 38 yr old intellectually disabled female with cerebral palsy and seizure disorder who is cared for by her natural mother Lindsey [name].  She has been having behavioral issues due to anxiety for several years. She attends a day school for 2 hours daily that she looks forward to and really enjoys. However  Her mother reports that she has an increasingly difficult time getting Lindsey [name] to go to sleep at night.  Lindsey [name] is up and don 7-10 times per night,  refuses to lie down at times. Lindsey [name],  denies pain,  nightmares,  and does not appear to be hallucination.  She sees a neurologist and a psychiatrist .   She is prescribed seroquel  300 mg   which is taken nightly at 7 pm , along with  lamictal  100 mg bid, Cogentin  1 mg and  8 mg valium  at night (2 mg in the am)     ROS: See pertinent positives and negatives per HPI.  Past Medical History:  Diagnosis Date   Allergy 1991   Anxiety 2000   COVID-19 12/2020   COVID-19 12/21/2020   Diagnosed in ER .  rx NOT GIVEN IN ER  For unclear reasons.   [facility]upiravir  [facility]     Epilepsy [facility]) age 50   negative metabolic workup has brain abnormalities by MRI (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/07/1992   Right   Torticollis, ocular     Past Surgical History:  Procedure Laterality Date   ABDOMINAL HYSTERECTOMY  05/08/2003   BRAIN SURGERY     Temporal Lobe resection   CERUMEN REMOVAL Bilateral 03/01/2023   Procedure: CERUMEN REMOVAL;  Surgeon: Lindsey Miu, MD;  Location: [facility];  Service: ENT;  Laterality: Bilateral;   CRANIOTOMY FOR TEMPORAL LOBECTOMY  EYE SURGERY  1993   TONSILLECTOMY AND ADENOIDECTOMY  age 78    Family History  Problem Relation Age of Onset   Cancer Maternal Grandfather    Cancer Maternal Aunt     SOCIAL HX:  reports that she has never smoked. She has never used smokeless tobacco. She reports that she does not drink alcohol and does not use drugs.    Current Outpatient Medications:    benztropine (COGENTIN) 1 MG tablet, Take 1 mg by mouth daily., Disp: , Rfl:    chlorhexidine  (PERIDEX ) 0.12 % solution, AFTER BREAKFAST AND SUPPER, SWISH WITH (TO FILL LINE) FOR 30  SECONDS AND SPIT OUT., Disp: 473 mL, Rfl: 0   diazepam  (DIASTAT  ACUDIAL) 10 MG GEL, Place 20 mg rectally once., Disp: , Rfl:    EPINEPHrine  0.3 mg/0.3 mL IJ SOAJ injection, INJECT 0.3 MG INTRAMUSCULARLY AS NEEDED FOR ANAPHYLAXIS, Disp: 2 each, Rfl: 0   folic acid  (FOLVITE ) 1 MG tablet, Take 1 tablet (1 mg total) by mouth daily., Disp: 90 tablet, Rfl: 3   LAMICTAL  100 MG tablet, Take 100 mg by mouth 2 (two) times daily. , Disp: , Rfl:    levETIRAcetam  (KEPPRA ) 750 MG tablet, Take 3 tablets (2,250 mg total) by mouth daily. TAKE 1 and a half pills twice a day, Disp: 90 tablet, Rfl: 0   Magnesium  Glycinate 100 MG CAPS, Take 1 capsule by mouth at bedtime., Disp: , Rfl:    melatonin 5 MG TABS, Take 5 mg by mouth at bedtime., Disp: , Rfl:    PARoxetine  (PAXIL ) 30 MG tablet, TAKE ONE TABLET TWICE DAILY, Disp: 180 tablet, Rfl: 1   QUEtiapine  (SEROQUEL ) 300 MG tablet, Take 300 mg by mouth at bedtime., Disp: , Rfl:    diazepam  (VALIUM ) 10 MG tablet, Take 1 tablet (10 mg total) by mouth every evening., Disp: 30 tablet, Rfl: 2   diazepam  (VALIUM ) 2 MG tablet, Take 1 tablet (2 mg total) by mouth in the morning., Disp: 30 tablet, Rfl: 5  EXAM:  VITALS per patient if applicable:  GENERAL: alert, oriented, appears well and in no acute distress  HEENT: atraumatic, conjunttiva clear, no obvious abnormalities on inspection of external nose and ears  NECK: normal movements of the head and neck  LUNGS: on inspection no signs of respiratory distress, breathing rate appears normal, no obvious gross SOB, gasping or wheezing  CV: no obvious cyanosis  MS: moves all visible extremities without noticeable abnormality  PSYCH/NEURO: pleasant and cooperative, no obvious depression or anxiety, speech and thought processing impaired   ASSESSMENT AND PLAN: Restlessness and agitation Assessment & Plan: Now seeing psychiatrist at [place].  Seroquel  dose changed to 300 mg taken at 7 pm.   Trazodone  stopped.  Did not  tolerate trial of Nuedexta which caused excessive crying and agitation and resulted in a fall at home requiring ER evaluation with CT head and spine negative for trauma .  Increase diazepam  dose to 10 mg at 7 pm    Drug-induced extrapyramidal movement disorder Assessment & Plan: She has a Parkinsonian type tremor of both hands,  Likely from use of 2nd generation antipsychotics which are necessary to manage her behavioral issues.    Continue cogentin and will use valium  to control agitation.     Other orders -     diazePAM ; Take 1 tablet (2 mg total) by mouth in the morning.  Dispense: 30 tablet; Refill: 5 -     diazePAM ; Take 1 tablet (10 mg total) by  mouth every evening.  Dispense: 30 tablet; Refill: 2      I discussed the assessment and treatment plan with the patient. The patient was provided an opportunity to ask questions and all were answered. The patient agreed with the plan and demonstrated an understanding of the instructions.   The patient was advised to call back or seek an in-person evaluation if the symptoms worsen or if the condition fails to improve as anticipated.    Verneita LITTIE Kettering, MD

## 2023-10-30 NOTE — Progress Notes (Deleted)
 Subjective:  Patient ID: Lindsey [name], female    DOB: Apr 12, 1986  Age: 38 y.o. MRN: [id number]  CC: There were no encounter diagnoses.   HPI Lindsey [name] presents for No chief complaint on file.     Outpatient Medications Prior to Visit  Medication Sig Dispense Refill   ALPRAZolam  (XANAX ) 0.25 MG tablet TAKE ONE-HALF TABLET BY MOUTH EVERY MORNING AND TAKE ONE TABLET IN THE EVENING 45 tablet 5   benztropine (COGENTIN) 0.5 MG tablet Take 0.5 mg by mouth daily. (Patient not taking: Reported on 09/09/2023)     benztropine (COGENTIN) 1 MG tablet Take 1 mg by mouth daily.     chlorhexidine  (PERIDEX ) 0.12 % solution AFTER BREAKFAST AND SUPPER, SWISH WITH (TO FILL LINE) FOR 30 SECONDS AND SPIT OUT. 473 mL 0   diazepam  (DIASTAT  ACUDIAL) 10 MG GEL Place 20 mg rectally once.     diazepam  (VALIUM ) 2 MG tablet TAKE ONE TABLET BY MOUTH EVERY MORNING AND 4 TABLETS BY MOUTH IN THE EVENING ASDIRECTED 150 tablet 3   EPINEPHrine  0.3 mg/0.3 mL IJ SOAJ injection INJECT 0.3 MG INTRAMUSCULARLY AS NEEDED FOR ANAPHYLAXIS 2 each 0   folic acid  (FOLVITE ) 1 MG tablet Take 1 tablet (1 mg total) by mouth daily. 90 tablet 3   LAMICTAL  100 MG tablet Take 100 mg by mouth 2 (two) times daily.      levETIRAcetam  (KEPPRA ) 750 MG tablet Take 3 tablets (2,250 mg total) by mouth daily. TAKE 1 and a half pills twice a day 90 tablet 0   Magnesium  Glycinate 100 MG CAPS Take 1 capsule by mouth at bedtime.     melatonin 5 MG TABS Take 5 mg by mouth at bedtime.     PARoxetine  (PAXIL ) 30 MG tablet TAKE ONE TABLET TWICE DAILY 180 tablet 1   QUEtiapine  (SEROQUEL ) 300 MG tablet Take 300 mg by mouth at bedtime.     No facility-administered medications prior to visit.    Review of Systems;  Patient denies headache, fevers, malaise, unintentional weight loss, skin rash, eye pain, sinus congestion and sinus pain, sore throat, dysphagia,  hemoptysis , cough, dyspnea, wheezing, chest pain, palpitations, orthopnea, edema,  abdominal pain, nausea, melena, diarrhea, constipation, flank pain, dysuria, hematuria, urinary  Frequency, nocturia, numbness, tingling, seizures,  Focal weakness, Loss of consciousness,  Tremor, insomnia, depression, anxiety, and suicidal ideation.      Objective:  LMP 01/09/2011   BP Readings from Last 3 Encounters:  10/15/23 120/80  09/09/23 128/70  07/17/23 116/66    Wt Readings from Last 3 Encounters:  09/09/23 164 lb 9.6 oz (74.7 kg)  07/17/23 159 lb 6.4 oz (72.3 kg)  06/06/23 160 lb (72.6 kg)    Physical Exam  Lab Results  Component Value Date   HGBA1C 4.4 (L) 08/09/2022   HGBA1C 4.7 03/19/2022   HGBA1C 4.9 03/16/2021    Lab Results  Component Value Date   CREATININE 0.62 09/09/2023   CREATININE 0.52 05/31/2023   CREATININE 0.69 04/11/2023    Lab Results  Component Value Date   WBC 4.6 04/11/2023   HGB 14.6 04/11/2023   HCT 41.7 04/11/2023   PLT 256 04/11/2023   GLUCOSE 92 09/09/2023   CHOL 129 03/19/2022   TRIG 78.0 03/19/2022   HDL 32.30 (L) 03/19/2022   LDLCALC 81 03/19/2022   ALT 9 08/09/2022   AST 18 08/09/2022   NA 138 09/09/2023   K 3.8 09/09/2023   CL 104 09/09/2023   CREATININE 0.62  09/09/2023   BUN 7 09/09/2023   CO2 25 09/09/2023   TSH 1.16 08/09/2022   INR 1.2 05/08/2022   HGBA1C 4.4 (L) 08/09/2022    No results found.  Assessment & Plan:  .There are no diagnoses linked to this encounter.   I spent 34 minutes on the day of this face to face encounter reviewing patient's  most recent visit with cardiology,  nephrology,  and neurology,  prior relevant surgical and non surgical procedures, recent  labs and imaging studies, counseling on weight management,  reviewing the assessment and plan with patient, and post visit ordering and reviewing of  diagnostics and therapeutics with patient  .   Follow-up: No follow-ups on file.   Verneita LITTIE Kettering, MD

## 2023-10-30 NOTE — Assessment & Plan Note (Addendum)
 Now seeing psychiatrist at [facility].  Seroquel  dose changed to 300 mg taken at 7 pm.   Trazodone  stopped.  Did not tolerate trial of Nuedexta which caused excessive crying and agitation and resulted in a fall at home requiring ER evaluation with CT head and spine negative for trauma .  Increase diazepam  dose to 10 mg at 7 pm

## 2023-11-06 ENCOUNTER — Telehealth: Admitting: Internal Medicine

## 2023-11-13 ENCOUNTER — Encounter: Payer: Self-pay | Admitting: Internal Medicine

## 2023-12-28 ENCOUNTER — Emergency Department
Admission: EM | Admit: 2023-12-28 | Discharge: 2023-12-28 | Disposition: A | Discharge status: Home / Self Care | Attending: Emergency Medicine | Admitting: Emergency Medicine

## 2023-12-28 ENCOUNTER — Other Ambulatory Visit: Payer: Self-pay

## 2023-12-28 ENCOUNTER — Encounter: Payer: Self-pay | Admitting: *Deleted

## 2023-12-28 ENCOUNTER — Emergency Department

## 2023-12-28 DIAGNOSIS — J189 Pneumonia, unspecified organism: Secondary | ICD-10-CM

## 2023-12-28 DIAGNOSIS — J181 Lobar pneumonia, unspecified organism: Secondary | ICD-10-CM | POA: Diagnosis not present

## 2023-12-28 DIAGNOSIS — R451 Restlessness and agitation: Secondary | ICD-10-CM | POA: Diagnosis not present

## 2023-12-28 DIAGNOSIS — R410 Disorientation, unspecified: Secondary | ICD-10-CM | POA: Diagnosis present

## 2023-12-28 DIAGNOSIS — D72829 Elevated white blood cell count, unspecified: Secondary | ICD-10-CM | POA: Diagnosis not present

## 2023-12-28 LAB — CBC WITH DIFFERENTIAL/PLATELET
Abs Immature Granulocytes: 0.05 K/uL (ref 0.00–0.07)
Basophils Absolute: 0 K/uL (ref 0.0–0.1)
Basophils Relative: 0 %
Eosinophils Absolute: 0 K/uL (ref 0.0–0.5)
Eosinophils Relative: 0 %
HCT: 38.7 % (ref 36.0–46.0)
Hemoglobin: 13.5 g/dL (ref 12.0–15.0)
Immature Granulocytes: 0 %
Lymphocytes Relative: 10 %
Lymphs Abs: 1.1 K/uL (ref 0.7–4.0)
MCH: 30.1 pg (ref 26.0–34.0)
MCHC: 34.9 g/dL (ref 30.0–36.0)
MCV: 86.4 fL (ref 80.0–100.0)
Monocytes Absolute: 0.5 K/uL (ref 0.1–1.0)
Monocytes Relative: 5 %
Neutro Abs: 10 K/uL — ABNORMAL HIGH (ref 1.7–7.7)
Neutrophils Relative %: 85 %
Platelets: 214 K/uL (ref 150–400)
RBC: 4.48 MIL/uL (ref 3.87–5.11)
RDW: 12 % (ref 11.5–15.5)
WBC: 11.7 K/uL — ABNORMAL HIGH (ref 4.0–10.5)
nRBC: 0 % (ref 0.0–0.2)

## 2023-12-28 LAB — COMPREHENSIVE METABOLIC PANEL WITH GFR
ALT: 11 U/L (ref 0–44)
AST: 25 U/L (ref 15–41)
Albumin: 3.7 g/dL (ref 3.5–5.0)
Alkaline Phosphatase: 61 U/L (ref 38–126)
Anion gap: 7 (ref 5–15)
BUN: 7 mg/dL (ref 6–20)
CO2: 27 mmol/L (ref 22–32)
Calcium: 9.3 mg/dL (ref 8.9–10.3)
Chloride: 104 mmol/L (ref 98–111)
Creatinine, Ser: 0.65 mg/dL (ref 0.44–1.00)
GFR, Estimated: >60 mL/min (ref >60)
Glucose, Bld: 108 mg/dL — ABNORMAL HIGH (ref 70–99)
Potassium: 3.7 mmol/L (ref 3.5–5.1)
Sodium: 138 mmol/L (ref 135–145)
Total Bilirubin: 0.6 mg/dL (ref 0.0–1.2)
Total Protein: 6.6 g/dL (ref 6.5–8.1)

## 2023-12-28 LAB — URINALYSIS, W/ REFLEX TO CULTURE (INFECTION SUSPECTED)
Bilirubin Urine: NEGATIVE
Glucose, UA: NEGATIVE mg/dL
Hgb urine dipstick: NEGATIVE
Ketones, ur: NEGATIVE mg/dL
Leukocytes,Ua: NEGATIVE
Nitrite: NEGATIVE
Protein, ur: NEGATIVE mg/dL
Specific Gravity, Urine: 1.016 (ref 1.005–1.030)
pH: 6 (ref 5.0–8.0)

## 2023-12-28 MED ORDER — AMOXICILLIN 500 MG PO CAPS: 1000.0000 mg | ORAL_CAPSULE | Freq: Three times a day (TID) | ORAL | 0 refills | Status: AC

## 2023-12-28 MED ORDER — AMOXICILLIN 500 MG PO CAPS
1000.0000 mg | ORAL_CAPSULE | Freq: Once | ORAL | Status: AC
  Administered 2023-12-28: 1000 mg via ORAL
  Filled 2023-12-28: qty 2

## 2023-12-28 MED ORDER — MIDAZOLAM HCL 2 MG/2ML IJ SOLN
2.0000 mg | Freq: Once | INTRAMUSCULAR | Status: AC
  Administered 2023-12-28: 2 mg via NASAL
  Filled 2023-12-28: qty 2

## 2023-12-28 NOTE — Discharge Instructions (Signed)
 Lindsey [name] is seen in the ER today for her behavioral change.  Her testing here showed that she likely has a lung infection that may be contributing to her symptoms.  I sent a prescription for antibiotics to your pharmacy.  Please take these as directed.  Follow with your primary care doctor for further evaluation.  Return to the ER for new or worsening symptoms.

## 2023-12-28 NOTE — ED Triage Notes (Signed)
 Pt mother says that she has not been asleep since 2130, she was combative (hitting) her mother. Police have been at the home. Mother tried to take her the [facility] and they said because she was non verbal she had to come here. She unbuckled her seatbelt and tried to get out of the car on the way to [facility].  No recent change in meds. Pt is nonverbal, in w/c, taking meds as prescribed.

## 2023-12-28 NOTE — ED Notes (Signed)
 PT'S mother states her daughter has been combative and acting out  since 9pm yesterday. States the cops were called to their home for her behavior and when she tried to take her to [facility] she opened to car door and attempted to get out of the moving car.   Pt;s mother states when she has had a UTI in the past she has been combative.   PT was able to be redirected when she began crying and was assisted to the bed. The pt has no displayed any combative behavior at this time.

## 2023-12-28 NOTE — ED Provider Notes (Signed)
 [facility] Provider Note    Event Date/Time   First MD Initiated Contact with Patient 12/28/23 [phone]     (approximate)   History   Aggressive Behavior   HPI  Lindsey [name] is a 38 year old with cerebral palsy, intellectual disability, seizure disorder presenting to the emergency department for evaluation of agitation.  Accompanied by mother who reports that the patient has not been able to sleep tonight.  She has been more combative than her baseline.  No recent changes in medication.  Does not seem confused from her baseline.  No recent trauma.  Mom reports similar symptoms in the setting of UTI in the past.  Physical Exam   Triage Vital Signs: ED Triage Vitals  Encounter Vitals Group     BP 12/28/23 0403 (!) 143/93     Girls Systolic BP Percentile --      Girls Diastolic BP Percentile --      Boys Systolic BP Percentile --      Boys Diastolic BP Percentile --      Pulse Rate 12/28/23 0403 62     Resp 12/28/23 0403 20     Temp 12/28/23 0407 98.1 F (36.7 C)     Temp Source 12/28/23 0407 Axillary     SpO2 12/28/23 0403 98 %     Weight --      Height --      Head Circumference --      Peak Flow --      Pain Score --      Pain Loc --      Pain Education --      Exclude from Growth Chart --     Most recent vital signs: Vitals:   12/28/23 0407 12/28/23 0700  BP:    Pulse:  (!) 101  Resp:    Temp: 98.1 F (36.7 C)   SpO2:  100%     General: Awake, interactive  CV:  Regular rate, good peripheral perfusion.  Resp:  Unlabored respirations, lungs clear to auscultation Abd:  Nondistended, soft, no appreciable tenderness Neuro:  Says a few words, sometimes appropriate to context   ED Results / Procedures / Treatments   Labs (all labs ordered are listed, but only abnormal results are displayed) Labs Reviewed  URINALYSIS, W/ REFLEX TO CULTURE (INFECTION SUSPECTED) - Abnormal; Notable for the following components:      Result Value    Color, Urine YELLOW (*)    APPearance HAZY (*)    Bacteria, UA FEW (*)    All other components within normal limits  CBC WITH DIFFERENTIAL/PLATELET - Abnormal; Notable for the following components:   WBC 11.7 (*)    Neutro Abs 10.0 (*)    All other components within normal limits  COMPREHENSIVE METABOLIC PANEL WITH GFR - Abnormal; Notable for the following components:   Glucose, Bld 108 (*)    All other components within normal limits     EKG EKG independently reviewed and interpreted by myself demonstrates:    RADIOLOGY Imaging independently reviewed and interpreted by myself demonstrates:  X-Suvan Stcyr with left-sided opacity  Formal Radiology Read:  DG Chest Portable 1 View Result Date: 12/28/2023 CLINICAL DATA:  Behavior change.  Value 8 for infection. EXAM: PORTABLE CHEST 1 VIEW COMPARISON:  09/24/2022 FINDINGS: Low volume film. The cardio pericardial silhouette is enlarged. Retrocardiac left base atelectasis/infiltrate noted. Mild vascular congestion. Marked gaseous distention of bowel in the visualized upper abdomen, progressive in the interval. Battery pack for  probable stimulator device overlies the left hemithorax. IMPRESSION: 1. Low volume film with retrocardiac left base atelectasis/infiltrate. 2. Marked gaseous distention of bowel in the visualized upper abdomen, progressive in the interval. Electronically Signed   By: Camellia Candle M.D.   On: 12/28/2023 07:04    PROCEDURES:  Critical Care performed: No  Procedures   MEDICATIONS ORDERED IN ED: Medications  amoxicillin  (AMOXIL ) capsule 1,000 mg (has no administration in time range)  midazolam  (VERSED ) injection 2 mg (2 mg Nasal Given 12/28/23 0513)     IMPRESSION / MDM / ASSESSMENT AND PLAN / ED COURSE  I reviewed the triage vital signs and the nursing notes.  Differential diagnosis includes, but is not limited to, UTI, pneumonia, anemia, electrolyte abnormality, much lower suspicion acute intracranial process    Patient's presentation is most consistent with acute presentation with potential threat to life or bodily function.  38 year old female presenting with increased agitation.  Stable vitals on presentation.  Discussed with mother, primary concern is a medical etiology for the change in her behavior.  Was given Versed  for anxiolysis.  Urinalysis was obtained but this does not appear consistent with infection.  In the setting of this, labs, x-Timo Hartwig will be obtained.  Discussed with mother.  If these are reassuring, she is comfortable with discharge with outpatient follow-up.  Labs with mild leukocytosis with WC of 11.7.  CMP without significant derangement.  X-Averyana Pillars was concerning for a retrocardiac opacity.  Patient and family reevaluated.  They do report a new cough over the past few days though no associated shortness of breath or significant discomfort.  Do suspect pneumonia may be contributing to patient's behavioral change.  Satting in the upper 90s on room air with unlabored respirations.  Family is comfortable with outpatient treatment.  Strict return precautions provided.  Will discharge with prescription for amoxicillin .    FINAL CLINICAL IMPRESSION(S) / ED DIAGNOSES   Final diagnoses:  Agitation  Pneumonia of left lung due to infectious organism, unspecified part of lung     Rx / DC Orders   ED Discharge Orders          Ordered    amoxicillin  (AMOXIL ) 500 MG capsule  3 times daily        12/28/23 9285             Note:  This document was prepared using Dragon voice recognition software and may include unintentional dictation errors.   Levander Slate, MD 12/28/23 [phone]

## 2023-12-30 ENCOUNTER — Telehealth: Payer: Self-pay

## 2023-12-30 NOTE — Telephone Encounter (Signed)
 Copied from CRM [id number]. Topic: Clinical - Medication Question >> Dec 30, 2023 10:36 AM Thersia BROCKS wrote: Reason for CRM: patient mom called in wanted to know regarding patient flu shot had the flu shot and had a bad reaction, didnt know if the nasal is better , would like for Dr.Tullo to give her a callback , also wanted to let Dr.Tullo know that patient has been in the ER for pneumonia as well over the weekend   [phone]

## 2023-12-30 NOTE — Telephone Encounter (Signed)
 Spoke with pt's mother and she stated that after pt received the flu shot she developed a very high fever didn't say exact temperature but said it was so high she was afraid the pt was going to have a seizure. Mother also would like to know if there is something they can try to help with pt's sleep. She stated that for the last several weeks pt will be about to fall asleep and then sits straight back up in bed awake.

## 2023-12-30 NOTE — Telephone Encounter (Signed)
 Pt's mother called wanting to know if Dr. Marylynn thought that the flu nasal spray was any better or okay for the pt to get since she had a bad reaction to the injectable flu vaccine.

## 2024-01-01 ENCOUNTER — Telehealth: Admitting: Internal Medicine

## 2024-01-01 ENCOUNTER — Encounter: Payer: Self-pay | Admitting: Internal Medicine

## 2024-01-01 ENCOUNTER — Telehealth: Payer: Self-pay

## 2024-01-01 VITALS — Ht 66.0 in | Wt 164.0 lb

## 2024-01-01 DIAGNOSIS — G4701 Insomnia due to medical condition: Secondary | ICD-10-CM

## 2024-01-01 DIAGNOSIS — J690 Pneumonitis due to inhalation of food and vomit: Secondary | ICD-10-CM | POA: Diagnosis not present

## 2024-01-01 DIAGNOSIS — G4700 Insomnia, unspecified: Secondary | ICD-10-CM | POA: Insufficient documentation

## 2024-01-01 DIAGNOSIS — K5900 Constipation, unspecified: Secondary | ICD-10-CM

## 2024-01-01 HISTORY — DX: Pneumonitis due to inhalation of food and vomit: J69.0

## 2024-01-01 MED ORDER — ALPRAZOLAM 0.5 MG PO TABS: 0.5000 mg | ORAL_TABLET | Freq: Two times a day (BID) | ORAL | 0 refills | Status: DC | PRN

## 2024-01-01 NOTE — Telephone Encounter (Signed)
 Patient's mom, [name], called back and I scheduled a virtual visit for her with Dr. Verneita Kettering today at 11:30am.

## 2024-01-01 NOTE — Telephone Encounter (Signed)
 noted

## 2024-01-01 NOTE — Telephone Encounter (Signed)
  Last Visit: 09/09/2023 Next Visit: 04/27/2024

## 2024-01-01 NOTE — Assessment & Plan Note (Signed)
 Linzess  trial failed . Moving bowels every 2-3 days with every other day miralax, recommend adding Benefiber daily

## 2024-01-01 NOTE — Telephone Encounter (Signed)
 Was a new rx for the 0.5 mg dose supposed to be sent in or was she to just take 2 of the 0.25 mg tabs until she sees if it is going to work or not.

## 2024-01-01 NOTE — Assessment & Plan Note (Signed)
 She has no signs of dysphagia based on mother's observation of hee oral intake.  Continue amoxicillin  1000 mg tid unti lgone.  Repeat chest film, 2 view, needed n or around Sept 30

## 2024-01-01 NOTE — Assessment & Plan Note (Signed)
 Continue seroquel  and valium  10 mg at bedtime.  Advised mother to increase alprazolam  dose from 0.25 to 0.5 mg as a trial

## 2024-01-01 NOTE — Addendum Note (Signed)
 Addended by: MARYLYNN VERNEITA CROME on: 01/01/2024 04:51 PM   Modules accepted: Orders

## 2024-01-01 NOTE — Progress Notes (Signed)
 Virtual Visit via Caregility   Note   This format is felt to be most appropriate for this patient at this time.  All issues noted in this document were discussed and addressed.  No physical exam was performed (except for noted visual exam findings with Video Visits).   I connected with Lindsey [name] and her mother Lindsey [name]  on 01/01/24 at 11:30 AM EDT by a video enabled telemedicine application  and verified that I am speaking with the correct person using two identifiers. Location patient: home Location provider: work or home office Persons participating in the virtual visit: patient, provider  I discussed the limitations, risks, security and privacy concerns of performing an evaluation and management service by telephone and the availability of in person appointments. I also discussed with the patient that there may be a patient responsible charge related to this service. The patient expressed understanding and agreed to proceed.  Reason for visit: ER follow up  HPI:  Lindsey [name] was evaluated in ER on August 23 for mental status changes and was diagnosed with LLL pneumonia by chest x ray . Lindsey [name] She has been taking amoxicillin  as directed and is taking a probiotic.    Lindsey [name] has been having difficulty falling and staying asleep for the last several days,  despite her bedtime regimen of valium  10 mg ,  alprazolam  0.25 mg,  Seroqeul 300 mg,  Keppr and Lamictal .  Per her moth she is restless,  falls asleep,  but then pops up wide awake and spends the rest of the night awake. She denies pain and fever.    ROS: See pertinent positives and negatives per HPI.  Past Medical History:  Diagnosis Date   Allergy 1991   Anemia    Anxiety 2000   COVID-19 12/2020   COVID-19 12/21/2020   Diagnosed in ER .  rx NOT GIVEN IN ER  For unclear reasons.   [facility]     Epilepsy [facility]) age 38   negative metabolic workup has brain abnormalities by MRI (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   Right   Torticollis, ocular     Past Surgical History:  Procedure Laterality Date   ABDOMINAL HYSTERECTOMY  2005   BRAIN SURGERY  1994   Temporal Lobe resection   CERUMEN REMOVAL Bilateral 03/01/2023   Procedure: CERUMEN REMOVAL;  Surgeon: Herminio Miu, MD;  Location: [facility];  Service: ENT;  Laterality: Bilateral;   CRANIOTOMY FOR TEMPORAL LOBECTOMY     EYE SURGERY  1993   TONSILLECTOMY AND ADENOIDECTOMY  age 14    Family History  Problem Relation Age of Onset   Cancer Maternal Grandfather    Cancer Maternal Aunt    Cancer Maternal Aunt     SOCIAL HX: Lindsey [name] is intellectually disabled and lives with her mother Lindsey [name] and her grandmother    Current Outpatient Medications:    ALPRAZolam  (XANAX ) 0.25 MG tablet, Take 0.25 mg by mouth at bedtime., Disp: , Rfl:    amoxicillin  (AMOXIL )  500 MG capsule, Take 2 capsules (1,000 mg total) by mouth 3 (three) times daily for 7 days., Disp: 42 capsule, Rfl: 0   benztropine (COGENTIN) 1 MG tablet, Take 1 mg by mouth daily., Disp: , Rfl:    chlorhexidine  (PERIDEX ) 0.12 % solution, AFTER BREAKFAST AND SUPPER, SWISH WITH (TO FILL LINE) FOR 30 SECONDS AND SPIT OUT., Disp: 473 mL, Rfl: 0   diazepam  (DIASTAT  ACUDIAL) 10 MG GEL, Place 20 mg rectally once., Disp: , Rfl:    diazepam  (VALIUM ) 10 MG tablet, Take 1 tablet (10 mg total) by mouth every evening., Disp: 30 tablet, Rfl: 2   diazepam  (VALIUM ) 2 MG tablet, Take 1 tablet (2 mg total) by mouth in the morning., Disp: 30  tablet, Rfl: 5   EPINEPHrine  0.3 mg/0.3 mL IJ SOAJ injection, INJECT 0.3 MG INTRAMUSCULARLY AS NEEDED FOR ANAPHYLAXIS, Disp: 2 each, Rfl: 0   folic acid  (FOLVITE ) 1 MG tablet, Take 1 tablet (1 mg total) by mouth daily., Disp: 90 tablet, Rfl: 3   LAMICTAL  100 MG tablet, Take 100 mg by mouth 2 (two) times daily. , Disp: , Rfl:    levETIRAcetam  (KEPPRA ) 750 MG tablet, Take 3 tablets (2,250 mg total) by mouth daily. TAKE 1 and a half pills twice a day, Disp: 90 tablet, Rfl: 0   Magnesium  Glycinate 100 MG CAPS, Take 1 capsule by mouth at bedtime., Disp: , Rfl:    melatonin 5 MG TABS, Take 5 mg by mouth at bedtime., Disp: , Rfl:    PARoxetine  (PAXIL ) 30 MG tablet, TAKE ONE TABLET TWICE DAILY, Disp: 180 tablet, Rfl: 1   QUEtiapine  (SEROQUEL ) 300 MG tablet, Take 300 mg by mouth at bedtime., Disp: , Rfl:   EXAM:  VITALS per patient if applicable:  GENERAL: alert, not clear if oriented, appears well and in no acute distress  HEENT: atraumatic, conjunttiva clear, no obvious abnormalities on inspection of external nose and ears  NECK: normal movements of the head and neck  LUNGS: on inspection no signs of respiratory distress, breathing rate appears normal, no obvious gross SOB, gasping or wheezing  CV: no obvious cyanosis  MS: moves all visible extremities without noticeable abnormality  PSYCH/NEURO: pleasant and cooperative, no obvious depression or anxiety, speech and thought processing grossly intact  ASSESSMENT AND PLAN: Aspiration pneumonia of left lower lobe, unspecified aspiration pneumonia type [facility]) Assessment & Plan: She has no signs of dysphagia based on mother's observation of hee oral intake.  Continue amoxicillin  1000 mg tid unti lgone.  Repeat chest film, 2 view, needed n or around Sept 30  Orders: -     DG Chest 2 View; Future  Constipation, unspecified constipation type Assessment & Plan: Linzess  trial failed . Moving bowels every 2-3 days with every other day miralax,  recommend adding Benefiber daily    Insomnia due to medical condition Assessment & Plan: Continue seroquel  and valium  10 mg at bedtime.  Advised mother to increase alprazolam  dose from 0.25 to 0.5 mg as a trial        I discussed the assessment and treatment plan with the patient. The patient was provided an opportunity to ask questions and all were answered. The patient agreed with the plan and demonstrated an understanding of the instructions.   The patient was advised to call back or seek an in-person evaluation if the symptoms worsen or if the condition fails to improve as anticipated.   Verneita LITTIE Kettering, MD

## 2024-01-01 NOTE — Telephone Encounter (Signed)
 Copied from CRM [id number]. Topic: Clinical - Medication Question >> Jan 01, 2024  3:23 PM Berneda FALCON wrote: Reason for CRM: Mother [name] is calling to find out about the medication refill for this patient. States it should be 0.5 MG ALPRAZolam  (XANAX )   [facility] - [place], [place] - [address] [place] [address] Phone: [phone] Fax: [phone] Hours: Not open 24 hours  Please call [phone] Ilah)

## 2024-01-02 ENCOUNTER — Telehealth: Payer: Self-pay | Admitting: Internal Medicine

## 2024-01-02 NOTE — Telephone Encounter (Signed)
 Spoke with pt's mother to let her know that the 0.5 mg dose was sent in to pharmacy. Mother was already aware. She stated that she would be dropping off a physicians order today to be filled out since the dose was changed.

## 2024-01-02 NOTE — Telephone Encounter (Signed)
 Pt's Mom dropped off physician's order to be signed. (Could not addend previous note) It's in Dr Lula color folder up front. -kh

## 2024-01-03 NOTE — Telephone Encounter (Signed)
Placed in quick sign folder.  

## 2024-01-09 NOTE — Telephone Encounter (Signed)
 Pt's mother picked up form today -kh

## 2024-01-20 ENCOUNTER — Other Ambulatory Visit: Payer: Self-pay | Admitting: Internal Medicine

## 2024-01-24 ENCOUNTER — Ambulatory Visit
Admission: RE | Admit: 2024-01-24 | Discharge: 2024-01-24 | Disposition: A | Discharge status: Home / Self Care | Attending: Internal Medicine | Admitting: Internal Medicine

## 2024-01-24 ENCOUNTER — Ambulatory Visit
Admission: RE | Admit: 2024-01-24 | Discharge: 2024-01-24 | Disposition: A | Discharge status: Home / Self Care | Source: Ambulatory Visit | Attending: Internal Medicine | Admitting: Internal Medicine

## 2024-01-24 DIAGNOSIS — J690 Pneumonitis due to inhalation of food and vomit: Secondary | ICD-10-CM

## 2024-01-27 ENCOUNTER — Other Ambulatory Visit: Payer: Self-pay | Admitting: Internal Medicine

## 2024-01-30 ENCOUNTER — Other Ambulatory Visit: Payer: Self-pay | Admitting: Internal Medicine

## 2024-03-05 ENCOUNTER — Ambulatory Visit
Admission: RE | Admit: 2024-03-05 | Discharge: 2024-03-05 | Disposition: A | Discharge status: Home / Self Care | Source: Ambulatory Visit | Attending: Emergency Medicine | Admitting: Emergency Medicine

## 2024-03-05 VITALS — BP 112/81 | HR 100 | Temp 98.0°F | Resp 20

## 2024-03-05 DIAGNOSIS — J069 Acute upper respiratory infection, unspecified: Secondary | ICD-10-CM | POA: Diagnosis not present

## 2024-03-05 MED ORDER — AMOXICILLIN 500 MG PO CAPS: 500.0000 mg | ORAL_CAPSULE | Freq: Two times a day (BID) | ORAL | 0 refills | Status: AC

## 2024-03-05 MED ORDER — PROMETHAZINE-DM 6.25-15 MG/5ML PO SYRP: 2.5000 mL | ORAL_SOLUTION | Freq: Every evening | ORAL | 0 refills | Status: DC | PRN

## 2024-03-05 NOTE — ED Provider Notes (Signed)
 Lindsey [name]    CSN: [id number] Arrival date & time: 03/05/24  1502      History   Chief Complaint Chief Complaint  Patient presents with   Cough    Entered by patient    HPI Lindsey [name] is a 38 y.o. female.   Patient presents for evaluation of a fever peaking at 90.2, nasal congestion, nonproductive cough and increased fatigue beginning 2 weeks ago.  Difficulty sleeping at night due to persistent coughing.  Fever has.  Known sick contacts in household prior.  Tolerable to food and liquids.  Has been given Sudafed PE and Delsym which have been helpful.  Denies shortness of breath or wheezing.  Accompanied by mother.  History of cerebral palsy, all history obtained from guardian.   Past Medical History:  Diagnosis Date   Allergy 1991   Anemia    Anxiety 2000   COVID-19 12/2020   COVID-19 12/21/2020   Diagnosed in ER .  rx NOT GIVEN IN ER  For unclear reasons.   [facility]     Epilepsy [facility]) age 36   negative metabolic workup has brain abnormalities by MRI ([name], [facility])   Hypertropia of left eye    Ingrown toenail without infection 12/15/2012   Insomnia    Intellectual disability    Paronychia of right middle finger 09/24/2022   Partial epilepsy with impairment of consciousness, intractable (HCC)    Pneumonia of left lower lobe due to infectious organism 05/07/2022   Psychomotor agitation    Seizures (HCC) 1991   None since approx 2018   Status post VNS (vagus nerve stimulator) placement 05/07/1998   Temporal lobectomy behavior syndrome 05/07/1992   Right   Torticollis, ocular     Patient Active Problem List   Diagnosis Date Noted   Aspiration pneumonia of left lower lobe (HCC) 01/01/2024   Insomnia 01/01/2024   Diarrhea of presumed infectious origin 07/18/2023   Dermatitis 06/07/2023   Rash 01/17/2023   Otalgia of right ear 01/17/2023   Heat rash 11/14/2022   Folic acid  deficiency (non anemic) 08/12/2022   Hospital  discharge follow-up 05/31/2022   Anxiety and depression 05/07/2022   Seizure disorder (HCC) 05/07/2022   Leukopenia 03/27/2022   Anticonvulsant-induced pancytopenia 03/27/2022   Recurrent epistaxis 02/09/2022   Allergic rhinitis due to grass pollen 07/10/2021   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   Drug-induced leukopenia 06/29/2020   Change in behavior 05/04/2020   Moderate intellectual disability 05/04/2020   Poor dentition 09/02/2017   Constipation 11/10/2015   Long-term use of high-risk medication 10/11/2015   Xerosis of skin 12/12/2014   Restless legs syndrome 07/12/2014   Pharyngitis 10/30/2012   Restlessness and agitation 09/10/2012   Routine general medical examination at a health care facility 06/08/2012   Cerebral palsy (HCC) 05/08/2012   Acneiform dermatitis 06/30/2011   Partial epilepsy with intractable epilepsy (HCC) 03/23/2011    Past Surgical History:  Procedure Laterality Date   ABDOMINAL HYSTERECTOMY  2005   BRAIN SURGERY  1994   Temporal Lobe resection   CERUMEN REMOVAL Bilateral 03/01/2023   Procedure: CERUMEN REMOVAL;  Surgeon: Herminio Miu, MD;  Location: [facility];  Service: ENT;  Laterality: Bilateral;   CRANIOTOMY FOR TEMPORAL LOBECTOMY     EYE SURGERY  1993   TONSILLECTOMY AND ADENOIDECTOMY  age 89    OB History   No obstetric history on file.      Home Medications    Prior to Admission  medications   Medication Sig Start Date End Date Taking? Authorizing Provider  amoxicillin  (AMOXIL ) 500 MG capsule Take 1 capsule (500 mg total) by mouth 2 (two) times daily for 7 days. 03/05/24 03/12/24 Yes Jaydence Arnesen, Shelba SAUNDERS, NP  promethazine -dextromethorphan (PROMETHAZINE -DM) 6.25-15 MG/5ML syrup Take 2.5 mLs by mouth at bedtime as needed. 03/05/24  Yes Avram Danielson R, NP  ALPRAZolam  (XANAX ) 0.5 MG tablet TAKE ONE TABLET TWICE DAILY AS NEEDED FOR ANXIETY 02/01/24   Tullo, Teresa L, MD  benztropine (COGENTIN) 1 MG tablet Take  1 mg by mouth daily. 08/20/23   [provider]  chlorhexidine  (PERIDEX ) 0.12 % solution AFTER BREAKFAST AND SUPPER, SWISH WITH (TO FILL LINE) FOR 30 SECONDS AND SPIT OUT. 04/29/23   Marylynn Verneita CROME, MD  diazepam  (DIASTAT  ACUDIAL) 10 MG GEL Place 20 mg rectally once.    [provider]  diazepam  (VALIUM ) 10 MG tablet TAKE ONE TABLET BY MOUTH EVERY EVENING 01/20/24   Marylynn Verneita CROME, MD  diazepam  (VALIUM ) 2 MG tablet Take 1 tablet (2 mg total) by mouth in the morning. 10/30/23   Marylynn Verneita CROME, MD  EPINEPHrine  0.3 mg/0.3 mL IJ SOAJ injection INJECT 0.3 MG INTRAMUSCULARLY AS NEEDED FOR ANAPHYLAXIS 01/10/23   Marylynn Verneita CROME, MD  folic acid  (FOLVITE ) 1 MG tablet Take 1 tablet (1 mg total) by mouth daily. 09/11/23   Marylynn Verneita CROME, MD  LAMICTAL  100 MG tablet Take 100 mg by mouth 2 (two) times daily.  08/03/16   [provider]  levETIRAcetam  (KEPPRA ) 750 MG tablet Take 3 tablets (2,250 mg total) by mouth daily. TAKE 1 and a half pills twice a day 04/12/20   Clapacs, Norleen DASEN, MD  Magnesium  Glycinate 100 MG CAPS Take 1 capsule by mouth at bedtime.    [provider]  melatonin 5 MG TABS Take 5 mg by mouth at bedtime.    [provider]  PARoxetine  (PAXIL ) 30 MG tablet TAKE ONE TABLET TWICE DAILY 01/28/24   Tullo, Teresa L, MD  QUEtiapine  (SEROQUEL ) 300 MG tablet Take 300 mg by mouth at bedtime. 10/22/22   [provider]    Family History Family History  Problem Relation Age of Onset   Cancer Maternal Grandfather    Cancer Maternal Aunt    Cancer Maternal Aunt     Social History Social History   Tobacco Use   Smoking status: Never   Smokeless tobacco: Never  Vaping Use   Vaping status: Never Used  Substance Use Topics   Alcohol use: Never   Drug use: No     Allergies   Other, Influenza vac split quad, Benadryl  [diphenhydramine  hcl], Montelukast , Ropinirole , Zyrtec [cetirizine hcl], and Peanut-containing drug products   Review of  Systems Review of Systems  Respiratory:  Positive for cough.      Physical Exam Triage Vital Signs ED Triage Vitals [03/05/24 1508]  Encounter Vitals Group     BP 112/81     Girls Systolic BP Percentile      Girls Diastolic BP Percentile      Boys Systolic BP Percentile      Boys Diastolic BP Percentile      Pulse Rate 100     Resp 20     Temp 98 F (36.7 C)     Temp Source Tympanic     SpO2 96 %     Weight      Height      Head Circumference  Peak Flow      Pain Score      Pain Loc      Pain Education      Exclude from Growth Chart    No data found.  Updated Vital Signs BP 112/81 (BP Location: Left Arm)   Pulse 100   Temp 98 F (36.7 C) (Tympanic)   Resp 20   LMP 01/09/2011   SpO2 96%   Visual Acuity Right Eye Distance:   Left Eye Distance:   Bilateral Distance:    Right Eye Near:   Left Eye Near:    Bilateral Near:     Physical Exam Constitutional:      Appearance: Normal appearance.  HENT:     Head: Normocephalic.     Right Ear: Tympanic membrane, ear canal and external ear normal.     Left Ear: Tympanic membrane, ear canal and external ear normal.     Nose: Congestion present.     Mouth/Throat:     Mouth: Mucous membranes are moist.     Pharynx: Oropharynx is clear. No oropharyngeal exudate or posterior oropharyngeal erythema.  Eyes:     Extraocular Movements: Extraocular movements intact.  Cardiovascular:     Rate and Rhythm: Normal rate and regular rhythm.     Pulses: Normal pulses.     Heart sounds: Normal heart sounds.  Pulmonary:     Effort: Pulmonary effort is normal.     Breath sounds: Normal breath sounds.  Musculoskeletal:     Cervical back: Normal range of motion and neck supple.  Neurological:     Mental Status: She is alert and oriented to person, place, and time. Mental status is at baseline.      UC Treatments / Results  Labs (all labs ordered are listed, but only abnormal results are displayed) Labs Reviewed - No  data to display  EKG   Radiology No results found.  Procedures Procedures (including critical care time)  Medications Ordered in UC Medications - No data to display  Initial Impression / Assessment and Plan / UC Course  I have reviewed the triage vital signs and the nursing notes.  Pertinent labs & imaging results that were available during my care of the patient were reviewed by me and considered in my medical decision making (see chart for details).  Acute URI  Patient is in no signs of distress nor toxic appearing.  Vital signs are stable.  Low suspicion for pneumonia, pneumothorax or bronchitis and therefore will defer imaging.  Prescribed amoxicillin  and Promethazine  DM.May use additional over-the-counter medications as needed for supportive care.  May follow-up with urgent care as needed if symptoms persist or worsen.  Final Clinical Impressions(s) / UC Diagnoses   Final diagnoses:  Acute URI     Discharge Instructions      Take Amoxicillin  twice daily for 7 days  May use cough syrup at bedtime to left for sleep    You can take Tylenol  and/or Ibuprofen  as needed for fever reduction and pain relief.   For cough: honey 1/2 to 1 teaspoon (you can dilute the honey in water or another fluid).  You can also use guaifenesin  and dextromethorphan for cough. You can use a humidifier for chest congestion and cough.  If you don't have a humidifier, you can sit in the bathroom with the hot shower running.      For sore throat: try warm salt water gargles, cepacol lozenges, throat spray, warm tea or water with lemon/honey, popsicles or  ice, or OTC cold relief medicine for throat discomfort.   For congestion: take a daily anti-histamine like Zyrtec, Claritin , and a oral decongestant, such as pseudoephedrine.  You can also use Flonase 1-2 sprays in each nostril daily.   It is important to stay hydrated: drink plenty of fluids (water, gatorade/powerade/pedialyte, juices, or teas) to  keep your throat moisturized and help further relieve irritation/discomfort.    ED Prescriptions     Medication Sig Dispense Auth. Provider   promethazine -dextromethorphan (PROMETHAZINE -DM) 6.25-15 MG/5ML syrup Take 2.5 mLs by mouth at bedtime as needed. 118 mL Gorgeous Newlun R, NP   amoxicillin  (AMOXIL ) 500 MG capsule Take 1 capsule (500 mg total) by mouth 2 (two) times daily for 7 days. 14 capsule Tanis Hensarling, Shelba SAUNDERS, NP      PDMP not reviewed this encounter.   Teresa Shelba SAUNDERS, NP 03/05/24 [phone]

## 2024-03-05 NOTE — Discharge Instructions (Signed)
 Take Amoxicillin  twice daily for 7 days  May use cough syrup at bedtime to left for sleep    You can take Tylenol  and/or Ibuprofen  as needed for fever reduction and pain relief.   For cough: honey 1/2 to 1 teaspoon (you can dilute the honey in water or another fluid).  You can also use guaifenesin  and dextromethorphan for cough. You can use a humidifier for chest congestion and cough.  If you don't have a humidifier, you can sit in the bathroom with the hot shower running.      For sore throat: try warm salt water gargles, cepacol lozenges, throat spray, warm tea or water with lemon/honey, popsicles or ice, or OTC cold relief medicine for throat discomfort.   For congestion: take a daily anti-histamine like Zyrtec, Claritin , and a oral decongestant, such as pseudoephedrine.  You can also use Flonase 1-2 sprays in each nostril daily.   It is important to stay hydrated: drink plenty of fluids (water, gatorade/powerade/pedialyte, juices, or teas) to keep your throat moisturized and help further relieve irritation/discomfort.

## 2024-03-05 NOTE — ED Triage Notes (Signed)
 Mother reports patient has had a cough for 2 weeks. Patient has sudafed, cough syrup and mucinex  with mild relief.

## 2024-03-14 ENCOUNTER — Ambulatory Visit

## 2024-03-14 ENCOUNTER — Ambulatory Visit
Admission: RE | Admit: 2024-03-14 | Discharge: 2024-03-14 | Disposition: A | Discharge status: Home / Self Care | Source: Ambulatory Visit | Attending: Emergency Medicine | Admitting: Emergency Medicine

## 2024-03-14 VITALS — BP 105/70 | HR 91 | Temp 98.0°F | Resp 20

## 2024-03-14 DIAGNOSIS — J069 Acute upper respiratory infection, unspecified: Secondary | ICD-10-CM | POA: Diagnosis not present

## 2024-03-14 MED ORDER — AZITHROMYCIN 250 MG PO TABS: 250.0000 mg | ORAL_TABLET | Freq: Every day | ORAL | 0 refills | Status: DC

## 2024-03-14 MED ORDER — PREDNISONE 20 MG PO TABS: 40.0000 mg | ORAL_TABLET | Freq: Every day | ORAL | 0 refills | Status: DC

## 2024-03-14 NOTE — Discharge Instructions (Addendum)
 As a precaution we will attempt a different antibiotic to see if this will further help with symptom, take azithromycin  as directed  Begin prednisone  every morning with food as directed to reduce inflammation throughout the airway which can cause the cough to linger  You can take Tylenol  and/or Ibuprofen  as needed for fever reduction and pain relief.   For cough: honey 1/2 to 1 teaspoon (you can dilute the honey in water or another fluid).  You can also use guaifenesin  and dextromethorphan for cough. You can use a humidifier for chest congestion and cough.  If you don't have a humidifier, you can sit in the bathroom with the hot shower running.      For sore throat: try warm salt water gargles, cepacol lozenges, throat spray, warm tea or water with lemon/honey, popsicles or ice, or OTC cold relief medicine for throat discomfort.   For congestion: take a daily anti-histamine like Zyrtec, Claritin , and a oral decongestant, such as pseudoephedrine.  You can also use Flonase 1-2 sprays in each nostril daily.   It is important to stay hydrated: drink plenty of fluids (water, gatorade/powerade/pedialyte, juices, or teas) to keep your throat moisturized and help further relieve irritation/discomfort.

## 2024-03-14 NOTE — ED Provider Notes (Addendum)
 [name]    CSN: [id number] Arrival date & time: 03/14/24  9065      History   Chief Complaint Chief Complaint  Patient presents with   Cough    Entered by patient    HPI Lindsey [name] is a 38 y.o. female.   Patient presents for evaluation of a persisting nonproductive cough present for 1 month.  Had 1 occurrence where she was able to expel dark yellow sputum.  Was seen in this urgent care on 03/09/2024 prescribed antibiotics and cough syrup, completed with no change in symptoms.  Interfering with sleep.  Denies shortness of breath or wheezing.  Tolerable to food and liquids.   Past Medical History:  Diagnosis Date   Allergy 1991   Anemia    Anxiety 2000   COVID-19 12/2020   COVID-19 12/21/2020   Diagnosed in ER .  rx NOT GIVEN IN ER  For unclear reasons.   [facility]     Epilepsy [facility]) age 77   negative metabolic workup has brain abnormalities by MRI ([name], [facility])   Hypertropia of left eye    Ingrown toenail without infection 12/15/2012   Insomnia    Intellectual disability    Paronychia of right middle finger 09/24/2022   Partial epilepsy with impairment of consciousness, intractable (HCC)    Pneumonia of left lower lobe due to infectious organism 05/07/2022   Psychomotor agitation    Seizures (HCC) 1991   None since approx 2018   Status post VNS (vagus nerve stimulator) placement 05/07/1998   Temporal lobectomy behavior syndrome 05/07/1992   Right   Torticollis, ocular     Patient Active Problem List   Diagnosis Date Noted   Aspiration pneumonia of left lower lobe (HCC) 01/01/2024   Insomnia 01/01/2024   Diarrhea of presumed infectious origin 07/18/2023   Dermatitis 06/07/2023   Rash 01/17/2023   Otalgia of right ear 01/17/2023   Heat rash 11/14/2022   Folic acid  deficiency (non anemic) 08/12/2022   Hospital discharge follow-up 05/31/2022   Anxiety and depression 05/07/2022   Seizure disorder (HCC) 05/07/2022    Leukopenia 03/27/2022   Anticonvulsant-induced pancytopenia 03/27/2022   Recurrent epistaxis 02/09/2022   Allergic rhinitis due to grass pollen 07/10/2021   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   Drug-induced leukopenia 06/29/2020   Change in behavior 05/04/2020   Moderate intellectual disability 05/04/2020   Poor dentition 09/02/2017   Constipation 11/10/2015   Long-term use of high-risk medication 10/11/2015   Xerosis of skin 12/12/2014   Restless legs syndrome 07/12/2014   Pharyngitis 10/30/2012   Restlessness and agitation 09/10/2012   Routine general medical examination at a health care facility 06/08/2012   Cerebral palsy (HCC) 05/08/2012   Acneiform dermatitis 06/30/2011   Partial epilepsy with intractable epilepsy (HCC) 03/23/2011    Past Surgical History:  Procedure Laterality Date   ABDOMINAL HYSTERECTOMY  2005   BRAIN SURGERY  1994   Temporal Lobe resection   CERUMEN REMOVAL Bilateral 03/01/2023   Procedure: CERUMEN REMOVAL;  Surgeon: Herminio Miu, MD;  Location: [facility];  Service: ENT;  Laterality: Bilateral;   CRANIOTOMY FOR TEMPORAL LOBECTOMY     EYE SURGERY  1993   TONSILLECTOMY AND ADENOIDECTOMY  age 65    OB History   No obstetric history on file.      Home Medications    Prior to Admission medications   Medication Sig Start Date End Date Taking? Authorizing Provider  ALPRAZolam  (XANAX ) 0.5  MG tablet TAKE ONE TABLET TWICE DAILY AS NEEDED FOR ANXIETY 02/01/24   Tullo, Teresa L, MD  benztropine (COGENTIN) 1 MG tablet Take 1 mg by mouth daily. 08/20/23   [provider]  chlorhexidine  (PERIDEX ) 0.12 % solution AFTER BREAKFAST AND SUPPER, SWISH WITH (TO FILL LINE) FOR 30 SECONDS AND SPIT OUT. 04/29/23   Marylynn Verneita CROME, MD  diazepam  (DIASTAT  ACUDIAL) 10 MG GEL Place 20 mg rectally once.    [provider]  diazepam  (VALIUM ) 10 MG tablet TAKE ONE TABLET BY MOUTH EVERY EVENING 01/20/24   Marylynn Verneita CROME,  MD  diazepam  (VALIUM ) 2 MG tablet Take 1 tablet (2 mg total) by mouth in the morning. 10/30/23   Marylynn Verneita CROME, MD  EPINEPHrine  0.3 mg/0.3 mL IJ SOAJ injection INJECT 0.3 MG INTRAMUSCULARLY AS NEEDED FOR ANAPHYLAXIS 01/10/23   Marylynn Verneita CROME, MD  folic acid  (FOLVITE ) 1 MG tablet Take 1 tablet (1 mg total) by mouth daily. 09/11/23   Marylynn Verneita CROME, MD  LAMICTAL  100 MG tablet Take 100 mg by mouth 2 (two) times daily.  08/03/16   [provider]  levETIRAcetam  (KEPPRA ) 750 MG tablet Take 3 tablets (2,250 mg total) by mouth daily. TAKE 1 and a half pills twice a day 04/12/20   Clapacs, Norleen DASEN, MD  Magnesium  Glycinate 100 MG CAPS Take 1 capsule by mouth at bedtime.    [provider]  melatonin 5 MG TABS Take 5 mg by mouth at bedtime.    [provider]  PARoxetine  (PAXIL ) 30 MG tablet TAKE ONE TABLET TWICE DAILY 01/28/24   Tullo, Teresa L, MD  promethazine -dextromethorphan (PROMETHAZINE -DM) 6.25-15 MG/5ML syrup Take 2.5 mLs by mouth at bedtime as needed. 03/05/24   Lakendria Nicastro, Shelba SAUNDERS, NP  QUEtiapine  (SEROQUEL ) 300 MG tablet Take 300 mg by mouth at bedtime. 10/22/22   [provider]    Family History Family History  Problem Relation Age of Onset   Cancer Maternal Grandfather    Cancer Maternal Aunt    Cancer Maternal Aunt     Social History Social History   Tobacco Use   Smoking status: Never   Smokeless tobacco: Never  Vaping Use   Vaping status: Never Used  Substance Use Topics   Alcohol use: Never   Drug use: No     Allergies   Other, Influenza vac split quad, Benadryl  [diphenhydramine  hcl], Montelukast , Ropinirole , Zyrtec [cetirizine hcl], and Peanut-containing drug products   Review of Systems Review of Systems   Physical Exam Triage Vital Signs ED Triage Vitals  Encounter Vitals Group     BP 03/14/24 1003 105/70     Girls Systolic BP Percentile --      Girls Diastolic BP Percentile --      Boys Systolic BP Percentile --      Boys  Diastolic BP Percentile --      Pulse Rate 03/14/24 1003 91     Resp 03/14/24 1003 20     Temp 03/14/24 1003 98 F (36.7 C)     Temp Source 03/14/24 1003 Tympanic     SpO2 03/14/24 1001 96 %     Weight --      Height --      Head Circumference --      Peak Flow --      Pain Score --      Pain Loc --      Pain Education --      Exclude from Growth Chart --  No data found.  Updated Vital Signs BP 105/70 (BP Location: Left Arm)   Pulse 91   Temp 98 F (36.7 C) (Tympanic)   Resp 20   LMP 01/09/2011   SpO2 96%   Visual Acuity Right Eye Distance:   Left Eye Distance:   Bilateral Distance:    Right Eye Near:   Left Eye Near:    Bilateral Near:     Physical Exam Constitutional:      Appearance: Normal appearance.  Cardiovascular:     Rate and Rhythm: Normal rate and regular rhythm.     Pulses: Normal pulses.     Heart sounds: Normal heart sounds.  Pulmonary:     Effort: Pulmonary effort is normal.     Breath sounds: Normal breath sounds.  Neurological:     Mental Status: She is alert and oriented to person, place, and time.      UC Treatments / Results  Labs (all labs ordered are listed, but only abnormal results are displayed) Labs Reviewed - No data to display  EKG   Radiology No results found.  Procedures Procedures (including critical care time)  Medications Ordered in UC Medications - No data to display  Initial Impression / Assessment and Plan / UC Course  I have reviewed the triage vital signs and the nursing notes.  Pertinent labs & imaging results that were available during my care of the patient were reviewed by me and considered in my medical decision making (see chart for details).  Acute URI  Vital signs are stable, patient in no signs of distress nontoxic-appearing, lungs clear to auscultation stable for outpatient management, attempted chest x-ray, unable to tolerate, will attempt additional antibiotics as well as steroids  azithromycin  and prednisone  sent to pharmacy and discussed administration with guardian recommended continued use of over-the-counter medicines, prescribed cough medicine and nonpharmacological measures, may follow-up with urgent care as needed Final Clinical Impressions(s) / UC Diagnoses   Final diagnoses:  Acute URI   Discharge Instructions   None    ED Prescriptions   None    PDMP not reviewed this encounter.   Teresa Shelba SAUNDERS, NP 03/14/24 1030    Teresa Shelba R, [place] 03/14/24 1030

## 2024-03-14 NOTE — ED Triage Notes (Signed)
 Patient in room 4 of UC.    Mother reports cough with yellow mucus. Patient has had the cough for about a month. Patient finished antibiotics  and still using cough syrup. Patient also been taking mucinex . Patient was seen here for same on 03/05/24.

## 2024-03-18 ENCOUNTER — Encounter: Payer: Self-pay | Admitting: Internal Medicine

## 2024-03-30 ENCOUNTER — Ambulatory Visit
Admission: RE | Admit: 2024-03-30 | Discharge: 2024-03-30 | Disposition: A | Discharge status: Home / Self Care | Attending: Emergency Medicine | Admitting: Emergency Medicine

## 2024-03-30 ENCOUNTER — Ambulatory Visit

## 2024-03-30 VITALS — BP 110/80 | HR 86 | Temp 97.7°F | Resp 20

## 2024-03-30 DIAGNOSIS — B349 Viral infection, unspecified: Secondary | ICD-10-CM | POA: Diagnosis not present

## 2024-03-30 DIAGNOSIS — R051 Acute cough: Secondary | ICD-10-CM

## 2024-03-30 DIAGNOSIS — R112 Nausea with vomiting, unspecified: Secondary | ICD-10-CM | POA: Diagnosis not present

## 2024-03-30 LAB — POC SOFIA SARS ANTIGEN FIA: SARS Coronavirus 2 Ag: NEGATIVE

## 2024-03-30 MED ORDER — BENZONATATE 100 MG PO CAPS: 100.0000 mg | ORAL_CAPSULE | Freq: Three times a day (TID) | ORAL | 0 refills | Status: DC | PRN

## 2024-03-30 MED ORDER — ONDANSETRON 4 MG PO TBDP: 4.0000 mg | ORAL_TABLET | Freq: Three times a day (TID) | ORAL | 0 refills | Status: AC | PRN

## 2024-03-30 NOTE — ED Triage Notes (Signed)
 Patient to Urgent Care with complaints of drainage/ fevers/ emesis/ cough/ poor appetite.   Symptoms started Friday. Fevers at night.   Taking ibuprofen  and tylenol .

## 2024-03-30 NOTE — Discharge Instructions (Addendum)
 Lindsey [name]'s COVID test is negative.  Give her the Tessalon  Perles as directed for cough.  Give her the Zofran  as directed for nausea and vomiting.  Follow-up with her primary care provider.  Take her to the emergency department if she has worsening symptoms.

## 2024-03-30 NOTE — ED Provider Notes (Signed)
 Lindsey [name]    CSN: [id number] Arrival date & time: 03/30/24  9149      History   Chief Complaint Chief Complaint  Patient presents with   Cough    Entered by patient    HPI Lindsey [name] is a 38 y.o. female.  Accompanied by her mother, patient presents with 3-day history of low-grade fever, decreased appetite, postnasal drip, cough, vomiting.  Last emesis occurred on 03/28/2024.  Decreased oral intake of foods but has been drinking fluids well.  Tmax 102 days ago.  Patient's mother has been giving her ibuprofen  and Tylenol  as needed.  No shortness of breath or diarrhea.  The history is provided by a parent and medical records.    Past Medical History:  Diagnosis Date   Allergy 1991   Anemia    Anxiety 2000   COVID-19 12/2020   COVID-19 12/21/2020   Diagnosed in ER .  rx NOT GIVEN IN ER  For unclear reasons.   [facility]upiravir  [facility]     Epilepsy [facility]) age 12   negative metabolic workup has brain abnormalities by MRI ([name], [facility])   Hypertropia of left eye    Ingrown toenail without infection 12/15/2012   Insomnia    Intellectual disability    Paronychia of right middle finger 09/24/2022   Partial epilepsy with impairment of consciousness, intractable (HCC)    Pneumonia of left lower lobe due to infectious organism 05/07/2022   Psychomotor agitation    Seizures (HCC) 1991   None since approx 2018   Status post VNS (vagus nerve stimulator) placement 05/07/1998   Temporal lobectomy behavior syndrome 05/07/1992   Right   Torticollis, ocular     Patient Active Problem List   Diagnosis Date Noted   Aspiration pneumonia of left lower lobe (HCC) 01/01/2024   Insomnia 01/01/2024   Diarrhea of presumed infectious origin 07/18/2023   Dermatitis 06/07/2023   Rash 01/17/2023   Otalgia of right ear 01/17/2023   Heat rash 11/14/2022   Folic acid  deficiency (non anemic) 08/12/2022   Hospital discharge follow-up 05/31/2022   Anxiety and  depression 05/07/2022   Seizure disorder (HCC) 05/07/2022   Leukopenia 03/27/2022   Anticonvulsant-induced pancytopenia 03/27/2022   Recurrent epistaxis 02/09/2022   Allergic rhinitis due to grass pollen 07/10/2021   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   Drug-induced leukopenia 06/29/2020   Change in behavior 05/04/2020   Moderate intellectual disability 05/04/2020   Poor dentition 09/02/2017   Constipation 11/10/2015   Long-term use of high-risk medication 10/11/2015   Xerosis of skin 12/12/2014   Restless legs syndrome 07/12/2014   Pharyngitis 10/30/2012   Restlessness and agitation 09/10/2012   Routine general medical examination at a health care facility 06/08/2012   Cerebral palsy (HCC) 05/08/2012   Acneiform dermatitis 06/30/2011   Partial epilepsy with intractable epilepsy (HCC) 03/23/2011    Past Surgical History:  Procedure Laterality Date   ABDOMINAL HYSTERECTOMY  2005   BRAIN SURGERY  1994   Temporal Lobe resection   CERUMEN REMOVAL Bilateral 03/01/2023   Procedure: CERUMEN REMOVAL;  Surgeon: Herminio Miu, MD;  Location: [facility];  Service: ENT;  Laterality: Bilateral;   CRANIOTOMY FOR TEMPORAL LOBECTOMY     EYE SURGERY  1993   TONSILLECTOMY AND ADENOIDECTOMY  age 21    OB History   No obstetric history on file.      Home Medications    Prior to Admission medications   Medication Sig Start Date End  Date Taking? Authorizing Provider  benzonatate  (TESSALON ) 100 MG capsule Take 1 capsule (100 mg total) by mouth 3 (three) times daily as needed for cough. 03/30/24  Yes Corlis Burnard DEL, NP  ondansetron  (ZOFRAN -ODT) 4 MG disintegrating tablet Take 1 tablet (4 mg total) by mouth every 8 (eight) hours as needed for nausea or vomiting. 03/30/24  Yes Corlis Burnard DEL, NP  ALPRAZolam  (XANAX ) 0.5 MG tablet TAKE ONE TABLET TWICE DAILY AS NEEDED FOR ANXIETY 02/01/24   Tullo, Teresa L, MD  benztropine (COGENTIN) 1 MG tablet Take 1 mg by mouth  daily. 08/20/23   [provider]  chlorhexidine  (PERIDEX ) 0.12 % solution AFTER BREAKFAST AND SUPPER, SWISH WITH (TO FILL LINE) FOR 30 SECONDS AND SPIT OUT. 04/29/23   Marylynn Verneita CROME, MD  diazepam  (DIASTAT  ACUDIAL) 10 MG GEL Place 20 mg rectally once.    [provider]  diazepam  (VALIUM ) 10 MG tablet TAKE ONE TABLET BY MOUTH EVERY EVENING 01/20/24   Marylynn Verneita CROME, MD  diazepam  (VALIUM ) 2 MG tablet Take 1 tablet (2 mg total) by mouth in the morning. 10/30/23   Marylynn Verneita CROME, MD  EPINEPHrine  0.3 mg/0.3 mL IJ SOAJ injection INJECT 0.3 MG INTRAMUSCULARLY AS NEEDED FOR ANAPHYLAXIS 01/10/23   Marylynn Verneita CROME, MD  folic acid  (FOLVITE ) 1 MG tablet Take 1 tablet (1 mg total) by mouth daily. 09/11/23   Marylynn Verneita CROME, MD  LAMICTAL  100 MG tablet Take 100 mg by mouth 2 (two) times daily.  08/03/16   [provider]  levETIRAcetam  (KEPPRA ) 750 MG tablet Take 3 tablets (2,250 mg total) by mouth daily. TAKE 1 and a half pills twice a day 04/12/20   Clapacs, Norleen DASEN, MD  Magnesium  Glycinate 100 MG CAPS Take 1 capsule by mouth at bedtime.    [provider]  melatonin 5 MG TABS Take 5 mg by mouth at bedtime.    [provider]  PARoxetine  (PAXIL ) 30 MG tablet TAKE ONE TABLET TWICE DAILY 01/28/24   Tullo, Teresa L, MD  QUEtiapine  (SEROQUEL ) 300 MG tablet Take 300 mg by mouth at bedtime. 10/22/22   [provider]    Family History Family History  Problem Relation Age of Onset   Cancer Maternal Grandfather    Cancer Maternal Aunt    Cancer Maternal Aunt     Social History Social History   Tobacco Use   Smoking status: Never   Smokeless tobacco: Never  Vaping Use   Vaping status: Never Used  Substance Use Topics   Alcohol use: Never   Drug use: No     Allergies   Other, Influenza vac split quad, Benadryl  [diphenhydramine  hcl], Montelukast , Ropinirole , Zyrtec [cetirizine hcl], and Peanut-containing drug products   Review of Systems Review of  Systems  Constitutional:  Positive for appetite change and fever. Negative for activity change.  HENT:  Positive for postnasal drip. Negative for ear discharge and trouble swallowing.   Respiratory:  Positive for cough.   Gastrointestinal:  Positive for vomiting. Negative for diarrhea.     Physical Exam Triage Vital Signs ED Triage Vitals  Encounter Vitals Group     BP      Girls Systolic BP Percentile      Girls Diastolic BP Percentile      Boys Systolic BP Percentile      Boys Diastolic BP Percentile      Pulse      Resp      Temp  Temp src      SpO2      Weight      Height      Head Circumference      Peak Flow      Pain Score      Pain Loc      Pain Education      Exclude from Growth Chart    No data found.  Updated Vital Signs BP 110/80   Pulse 86   Temp 97.7 F (36.5 C)   Resp 20   LMP 01/09/2011   SpO2 96%   Visual Acuity Right Eye Distance:   Left Eye Distance:   Bilateral Distance:    Right Eye Near:   Left Eye Near:    Bilateral Near:     Physical Exam Constitutional:      General: She is not in acute distress. HENT:     Right Ear: Tympanic membrane normal.     Left Ear: Tympanic membrane normal.     Nose: Nose normal.     Mouth/Throat:     Mouth: Mucous membranes are moist.     Pharynx: Oropharynx is clear.  Cardiovascular:     Rate and Rhythm: Normal rate and regular rhythm.     Heart sounds: Normal heart sounds.  Pulmonary:     Effort: Pulmonary effort is normal. No respiratory distress.     Breath sounds: Normal breath sounds.  Abdominal:     General: Bowel sounds are normal.     Palpations: Abdomen is soft.     Tenderness: There is no abdominal tenderness.  Neurological:     Mental Status: She is alert.      UC Treatments / Results  Labs (all labs ordered are listed, but only abnormal results are displayed) Labs Reviewed  POC SOFIA SARS ANTIGEN FIA    EKG   Radiology No results found.  Procedures Procedures  (including critical care time)  Medications Ordered in UC Medications - No data to display  Initial Impression / Assessment and Plan / UC Course  I have reviewed the triage vital signs and the nursing notes.  Pertinent labs & imaging results that were available during my care of the patient were reviewed by me and considered in my medical decision making (see chart for details).    Cough, nausea and vomiting, viral illness.  Afebrile and vital signs are stable.  Lungs are clear and O2 sat is 96% on room air.  COVID-negative.  Treating nausea and vomiting with Zofran .  Treating cough with Tessalon  Perles.  Instructed patient's mother to follow-up with her PCP.  ED precautions given.  Education provided on cough, nausea and vomiting, viral illness.  Mother agrees to plan of care.  Final Clinical Impressions(s) / UC Diagnoses   Final diagnoses:  Acute cough  Nausea and vomiting, unspecified vomiting type  Viral illness     Discharge Instructions      Zanya's COVID test is negative.  Give her the Tessalon  Perles as directed for cough.  Give her the Zofran  as directed for nausea and vomiting.  Follow-up with her primary care provider.  Take her to the emergency department if she has worsening symptoms.     ED Prescriptions     Medication Sig Dispense Auth. Provider   ondansetron  (ZOFRAN -ODT) 4 MG disintegrating tablet Take 1 tablet (4 mg total) by mouth every 8 (eight) hours as needed for nausea or vomiting. 20 tablet Corlis Burnard DEL, NP   benzonatate  (TESSALON ) 100  MG capsule Take 1 capsule (100 mg total) by mouth 3 (three) times daily as needed for cough. 21 capsule Corlis Burnard DEL, NP      PDMP not reviewed this encounter.   Corlis Burnard DEL, NP 03/30/24 [phone]

## 2024-04-09 ENCOUNTER — Telehealth: Payer: Self-pay

## 2024-04-09 MED ORDER — BENZONATATE 100 MG PO CAPS: 100.0000 mg | ORAL_CAPSULE | Freq: Three times a day (TID) | ORAL | 0 refills | Status: DC | PRN

## 2024-04-09 NOTE — Telephone Encounter (Signed)
 Patient's mother contacted Urgent Care requesting refill of benzonatate  100mg  caps.   Medication discussed with Burnard Cork NP. Refill sent to [facility] pharmacy.   No further questions asked.

## 2024-04-10 ENCOUNTER — Other Ambulatory Visit: Payer: Self-pay

## 2024-04-10 ENCOUNTER — Emergency Department
Admission: EM | Admit: 2024-04-10 | Discharge: 2024-04-11 | Disposition: A | Attending: Emergency Medicine | Admitting: Emergency Medicine

## 2024-04-10 DIAGNOSIS — G809 Cerebral palsy, unspecified: Secondary | ICD-10-CM | POA: Diagnosis not present

## 2024-04-10 DIAGNOSIS — R400 Somnolence: Secondary | ICD-10-CM | POA: Diagnosis not present

## 2024-04-10 DIAGNOSIS — R451 Restlessness and agitation: Secondary | ICD-10-CM | POA: Insufficient documentation

## 2024-04-10 DIAGNOSIS — F79 Unspecified intellectual disabilities: Secondary | ICD-10-CM | POA: Insufficient documentation

## 2024-04-10 DIAGNOSIS — T43505A Adverse effect of unspecified antipsychotics and neuroleptics, initial encounter: Secondary | ICD-10-CM

## 2024-04-10 DIAGNOSIS — Z8616 Personal history of COVID-19: Secondary | ICD-10-CM | POA: Insufficient documentation

## 2024-04-10 DIAGNOSIS — G4700 Insomnia, unspecified: Secondary | ICD-10-CM | POA: Insufficient documentation

## 2024-04-10 LAB — URINALYSIS, ROUTINE W REFLEX MICROSCOPIC
Bacteria, UA: NONE SEEN
Bilirubin Urine: NEGATIVE
Glucose, UA: NEGATIVE mg/dL
Hgb urine dipstick: NEGATIVE
Ketones, ur: 5 mg/dL — AB
Leukocytes,Ua: NEGATIVE
Nitrite: NEGATIVE
Protein, ur: 30 mg/dL — AB
Specific Gravity, Urine: 1.026 (ref 1.005–1.030)
pH: 5 (ref 5.0–8.0)

## 2024-04-10 LAB — COMPREHENSIVE METABOLIC PANEL WITH GFR
ALT: 9 U/L (ref 0–44)
AST: 23 U/L (ref 15–41)
Albumin: 4.3 g/dL (ref 3.5–5.0)
Alkaline Phosphatase: 84 U/L (ref 38–126)
Anion gap: 17 — ABNORMAL HIGH (ref 5–15)
BUN: 8 mg/dL (ref 6–20)
CO2: 20 mmol/L — ABNORMAL LOW (ref 22–32)
Calcium: 10.2 mg/dL (ref 8.9–10.3)
Chloride: 103 mmol/L (ref 98–111)
Creatinine, Ser: 0.62 mg/dL (ref 0.44–1.00)
GFR, Estimated: >60 mL/min (ref >60)
Glucose, Bld: 121 mg/dL — ABNORMAL HIGH (ref 70–99)
Potassium: 3.8 mmol/L (ref 3.5–5.1)
Sodium: 140 mmol/L (ref 135–145)
Total Bilirubin: 0.5 mg/dL (ref 0.0–1.2)
Total Protein: 7.6 g/dL (ref 6.5–8.1)

## 2024-04-10 LAB — CBC WITH DIFFERENTIAL/PLATELET
Abs Immature Granulocytes: 0.03 K/uL (ref 0.00–0.07)
Basophils Absolute: 0 K/uL (ref 0.0–0.1)
Basophils Relative: 0 %
Eosinophils Absolute: 0 K/uL (ref 0.0–0.5)
Eosinophils Relative: 0 %
HCT: 43.4 % (ref 36.0–46.0)
Hemoglobin: 15.2 g/dL — ABNORMAL HIGH (ref 12.0–15.0)
Immature Granulocytes: 0 %
Lymphocytes Relative: 30 %
Lymphs Abs: 2.7 K/uL (ref 0.7–4.0)
MCH: 29.1 pg (ref 26.0–34.0)
MCHC: 35 g/dL (ref 30.0–36.0)
MCV: 83.1 fL (ref 80.0–100.0)
Monocytes Absolute: 0.7 K/uL (ref 0.1–1.0)
Monocytes Relative: 7 %
Neutro Abs: 5.7 K/uL (ref 1.7–7.7)
Neutrophils Relative %: 63 %
Platelets: 427 K/uL — ABNORMAL HIGH (ref 150–400)
RBC: 5.22 MIL/uL — ABNORMAL HIGH (ref 3.87–5.11)
RDW: 13.1 % (ref 11.5–15.5)
WBC: 9.2 K/uL (ref 4.0–10.5)
nRBC: 0 % (ref 0.0–0.2)

## 2024-04-10 LAB — URINE DRUG SCREEN
Amphetamines: NEGATIVE
Barbiturates: NEGATIVE
Benzodiazepines: POSITIVE — AB
Cocaine: NEGATIVE
Fentanyl: NEGATIVE
Methadone Scn, Ur: NEGATIVE
Opiates: NEGATIVE
Tetrahydrocannabinol: NEGATIVE

## 2024-04-10 LAB — LACTIC ACID, PLASMA: Lactic Acid, Venous: 4.3 mmol/L (ref 0.5–1.9)

## 2024-04-10 MED ORDER — ONDANSETRON 4 MG PO TBDP: 4.0000 mg | ORAL_TABLET | Freq: Three times a day (TID) | ORAL | Status: DC | PRN

## 2024-04-10 MED ORDER — ZIPRASIDONE MESYLATE 20 MG IM SOLR: 20.0000 mg | Freq: Once | INTRAMUSCULAR | Status: AC

## 2024-04-10 MED ORDER — ZIPRASIDONE MESYLATE 20 MG IM SOLR
INTRAMUSCULAR | Status: AC
  Administered 2024-04-10: 20 mg via INTRAMUSCULAR
  Filled 2024-04-10: qty 20

## 2024-04-10 MED ORDER — MAGNESIUM GLYCINATE 100 MG PO CAPS: 1.0000 | ORAL_CAPSULE | Freq: Every day | ORAL | Status: DC

## 2024-04-10 MED ORDER — BENZONATATE 100 MG PO CAPS: 100.0000 mg | ORAL_CAPSULE | Freq: Three times a day (TID) | ORAL | Status: DC | PRN

## 2024-04-10 MED ORDER — PAROXETINE HCL 20 MG PO TABS
40.0000 mg | ORAL_TABLET | Freq: Every day | ORAL | Status: DC
  Administered 2024-04-10: 40 mg via ORAL
  Filled 2024-04-10: qty 2

## 2024-04-10 MED ORDER — LAMOTRIGINE 100 MG PO TABS
200.0000 mg | ORAL_TABLET | Freq: Every day | ORAL | Status: DC
  Administered 2024-04-10: 200 mg via ORAL
  Filled 2024-04-10: qty 2

## 2024-04-10 MED ORDER — LORAZEPAM 2 MG/ML IJ SOLN
2.0000 mg | Freq: Once | INTRAMUSCULAR | Status: AC
  Administered 2024-04-10: 2 mg via INTRAMUSCULAR
  Filled 2024-04-10: qty 1

## 2024-04-10 MED ORDER — SODIUM CHLORIDE 0.9 % IV BOLUS
1000.0000 mL | Freq: Once | INTRAVENOUS | Status: AC
  Administered 2024-04-10: 1000 mL via INTRAVENOUS

## 2024-04-10 MED ORDER — MELATONIN 5 MG PO TABS
5.0000 mg | ORAL_TABLET | Freq: Every day | ORAL | Status: DC
  Administered 2024-04-10: 5 mg via ORAL
  Filled 2024-04-10: qty 1

## 2024-04-10 MED ORDER — LEVETIRACETAM 750 MG PO TABS
2250.0000 mg | ORAL_TABLET | Freq: Every day | ORAL | Status: DC
  Administered 2024-04-11: 2250 mg via ORAL
  Filled 2024-04-10: qty 3

## 2024-04-10 MED ORDER — QUETIAPINE FUMARATE 25 MG PO TABS
150.0000 mg | ORAL_TABLET | Freq: Every day | ORAL | Status: DC
  Administered 2024-04-10: 150 mg via ORAL
  Filled 2024-04-10: qty 6

## 2024-04-10 MED ORDER — HALOPERIDOL LACTATE 5 MG/ML IJ SOLN
5.0000 mg | Freq: Once | INTRAMUSCULAR | Status: AC
  Administered 2024-04-10: 5 mg via INTRAMUSCULAR
  Filled 2024-04-10: qty 1

## 2024-04-10 NOTE — ED Triage Notes (Signed)
 Pt comes in via pov with complaints of AMS. Pt hasn't slept in the bed the past couple of nights, has not been eating, combative, and not her normal self. Pt's mother spoke with her primary care provider, and she was told to bring her in for a possible UTI. Pt has had a hard time getting her urine stream going. Pt uncooperative in triage, and denies pain at this time.

## 2024-04-10 NOTE — Consult Note (Signed)
 PSYCHIATRIC CONSULTATION NOTE  Patient: 38 year old female Location: Emergency Department Reason for Consult: Agitation, insomnia, aggression, medication review, disposition evaluation Requesting Provider: ED Team Evaluator: Consulting Psychiatry  I. CHIEF COMPLAINT  "Not sleeping for two days and getting aggressive."  II. HISTORY OF PRESENT ILLNESS  The patient is a 38 year old female with a history of cerebral palsy, intellectual disability, and chronic behavioral dysregulation who presented to the ED after two days of insomnia, followed by escalating nighttime agitation and aggression at home.  She lives with her mother, who is the primary caregiver, and her 51 year old grandmother with dementia. The mother reports escalating agitation and irritability associated with severe sleep disruption. The mother appears overwhelmed with caregiving responsibilities.  In the ED, the patient became highly agitated and received haloperidol  and subsequently ziprasidone , after which she became profoundly somnolent. She is currently sedated but arousable to painful stimuli, with stable vital signs and no airway compromise.  Medical evaluation showed mild dehydration, but no UTI, no constipation, no infection, and no acute medical trigger identified. Labs otherwise unremarkable.  III. PSYCHIATRIC HISTORY  Diagnoses: Intellectual disability, behavioral disturbance associated with neurodevelopmental disorders  Past medications: Depakote (discontinued due to alopecia)  No documented psychotic disorder or bipolar disorder  Behavioral dysregulation often associated with sleep disruption  IV. CURRENT HOME MEDICATIONS  Xanax  0.5 mg BID PRN  Diazepam  10 mg nightly  Lamictal  100 mg BID  Keppra  2250 mg daily  Paxil  30 mg BID  Seroquel  300 mg nightly  Cogentin 0.1 mg daily  Notable for polypharmacy, high sedative burden, dual benzodiazepines, and high nighttime antipsychotic dose.  V. MENTAL  STATUS EXAMINATION  Appearance: Adult female, sedated, lying in bed, no acute distress Behavior: Somnolent; limited engagement due to medication effect Speech: Minimal due to sedation Mood: Unable to assess directly Affect: Blunted/sedated Thought process: Unable to assess Thought content: No ability to assess suicidality, psychosis, or intent at this time due to sedation Perception: No overt hallucinations observed Orientation: Not assessable in current state Insight/Judgment: Impaired by developmental disability and sedation Safety: No current agitation; currently low acute risk due to sedation but high baseline vulnerability due to environment and lack of sleep  VI. VITAL SIGNS & PHYSICAL  Vitals stable  Airway intact  No rigidity, no EPS  No signs of NMS or serotonin toxicity  Appears mildly dehydrated  VII. SUICIDE & VIOLENCE RISK SCREENING  Suicide Risk: Unable to fully assess verbally due to sedation; no history of active suicidal ideation; risk considered low based on collateral.  Violence Risk: Elevated baseline risk in context of agitation, intellectual disability, sleep loss, and caregiver stress.  VIII. MEDICAL NECESSITY / COMPLICATIONS / RULE-OUTS  Dehydration contributing minimally to presentation  No evidence of delirium or infection  Presentation most consistent with sleep deprivation, environmental stress, and behavioral dysregulation, potentiated by polypharmacy  No acute psychiatric disorder requiring inpatient admission at a psychiatric hospital at this time, pending stabilization  IX. COMMUNICATION  Discussed case extensively with:  Mother (primary caregiver)  ED physician  Social worker All agree with monitoring overnight and pharmacologic simplification.  X. INFORMED CONSENT  Discussed medication adjustments and rationale with the mother as surrogate decision-maker due to patient's intellectual disability.  Benefits/risks reviewed;  mother verbalized understanding.  Telemedicine consent not applicable for this encounter.  XI. ASSESSMENT  This is a 38 year old woman with intellectual disability and cerebral palsy presenting with agitation secondary to severe insomnia and environmental stress. Her baseline regimen contains multiple sedatives, contributing to paradoxical agitation and acute over-sedation  after receiving antipsychotics in the ED.  She does not appear to have a primary psychiatric disorder driving acute symptoms. Current somnolence is medication-related.  She is medically stable for observation overnight with planned medication adjustments.  XII. RECOMMENDATIONS / PLAN 1. Observe Overnight in ED or Observation Unit  Monitor level of consciousness, airway, hydration  No additional sedating medications tonight  2. Discontinue Cogentin  No indication  Unnecessary anticholinergic burden  3. Adjust Chronic Medication Regimen  Reduce Paxil  to 40 mg po at bedtime   Lamictal : Consolidate to 200 mg nightly (helps with sleep-wake structure, reduces daytime sedation)  Seroquel : Reduce from 300 mg to 150 mg nightly to avoid excessive sedation  Avoid further benzodiazepines tonight due to current sedation  4. Hydration  Encourage PO; administer IV fluids if needed  5. Behavioral/Social Considerations  Mother is overwhelmed; caregiver strain is extremely high  Recommend:  Social work involvement  IDD/DD support services referral  Respite or in-home support evaluation  6. Disposition  Likely safe for discharge tomorrow morning if patient returns to baseline alertness and behavioral stability  Provide mother with clear safety instructions  Follow up with outpatient neurology/psychiatry within 1 week  XIII. OVERALL RISK LEVEL  Current acute risk: Low (due to sedation and stable vitals)  Baseline chronic risk: Moderate, driven by environmental stressors and intellectual disability  Plan  mitigates risk through stabilization and medication adjustment

## 2024-04-10 NOTE — ED Notes (Signed)
Pt ambulatory to restroom. Urine sample collected.

## 2024-04-10 NOTE — ED Provider Notes (Signed)
 [facility] Provider Note    Event Date/Time   First MD Initiated Contact with Patient 04/10/24 1249     (approximate)  History   Chief Complaint: Agitation, insomnia  HPI  Lindsey [name] is a 38 y.o. female with a past medical history of anemia, anxiety, intellectual disability, seizure disorder, temporal lobectomy in 1994 given the frequency of seizures, presents to the emergency department with concerns of increased agitation and insomnia.  According to mom who is the patient's main caregiver the patient has been sleeping often during the daytime and has been staying awake all night.  Patient has been increasingly agitated and has been unwilling to listen to the mother has been agitated possibly combative at times.  Mom states she did not know what else to do she called the patient's behavioral care team they recommend taking her to the emergency department.  Mom states she has had increased agitation previously with a urinary tract infection although denies any known urinary symptoms.  Physical Exam   Triage Vital Signs: ED Triage Vitals  Encounter Vitals Group     BP 04/10/24 1222 (!) 146/94     Girls Systolic BP Percentile --      Girls Diastolic BP Percentile --      Boys Systolic BP Percentile --      Boys Diastolic BP Percentile --      Pulse Rate 04/10/24 1222 (!) 53     Resp 04/10/24 1222 19     Temp 04/10/24 1222 (!) 97.5 F (36.4 C)     Temp src --      SpO2 04/10/24 1222 100 %     Weight 04/10/24 1223 164 lb 0.4 oz (74.4 kg)     Height 04/10/24 1223 5' 6 (1.676 m)     Head Circumference --      Peak Flow --      Pain Score 04/10/24 1223 0     Pain Loc --      Pain Education --      Exclude from Growth Chart --     Most recent vital signs: Vitals:   04/10/24 1222  BP: (!) 146/94  Pulse: (!) 53  Resp: 19  Temp: (!) 97.5 F (36.4 C)  SpO2: 100%    General: Awake sitting in the wheelchair.  Patient begins to scream when you  approach her.  Did allow brief auscultation although then pushed my hand away. CV:  Good peripheral perfusion.  Regular rate and rhythm  Resp:  Normal effort.  Equal breath sounds bilaterally.  Abd:  No distention.    ED Results / Procedures / Treatments   MEDICATIONS ORDERED IN ED: Medications  haloperidol  lactate (HALDOL ) injection 5 mg (has no administration in time range)  LORazepam  (ATIVAN ) injection 2 mg (has no administration in time range)     IMPRESSION / MDM / ASSESSMENT AND PLAN / ED COURSE  I reviewed the triage vital signs and the nursing notes.  Patient's presentation is most consistent with acute presentation with potential threat to life or bodily function.  Patient presents to the emergency department for increased agitation and insomnia.  Mom states she does not know what else to do at this point the mother is having difficulty getting any sleep as the patient is awake throughout the night.  Patient will often nap during the day but then does not sleep at night.  Patient is becoming increasingly agitated over the last few weeks.  Mom  tried calling her behavioral team but they referred her to the emergency department for further evaluation.  We will check labs and urinalysis.  Will have psychiatry and TTS evaluate as the patient may require behavioral/psychiatric medication modification.  Given the patient's agitation unwilling to allow even a blood draw at this time we will dose Haldol  and Ativan  to help calm the patient so that we can obtain lab work and a urine sample.  Urinalysis shows benzodiazepine positive urine however patient received intramuscular benzodiazepine in the emergency department.  Urinalysis shows no concerning finding.  We are continuing to have to constantly redirect the patient.  Mom is very frustrated and is asking for additional medication.  Patient does not being able to be verbally redirected continues to try to get out of the room despite receiving  IM Haldol  and Ativan  1 hour ago.  Patient does not appear sedated in the least at this time.  Will dose 20 mg of IM Geodon .  Hopefully we will be able to get lab work after the Geodon .  Patient will need psychiatric and TTS evaluation as well to see what we can do to help the patient be more easily managed at home.  Mom is agreeable to this care including increased medication.  We were able to get lab work showing a reassuring CBC overall reassuring chemistry but mild anion gap elevation with lactate of 4.3 most consistent with dehydration.  Will attempt to obtain a IV and dose IV fluids.  Psychiatry is currently evaluating to see if there are any medication changes recommended.  FINAL CLINICAL IMPRESSION(S) / ED DIAGNOSES   Agitation Insomnia   Note:  This document was prepared using Dragon voice recognition software and may include unintentional dictation errors.   Dorothyann Drivers, MD 04/10/24 1537

## 2024-04-10 NOTE — ED Notes (Signed)
 VOL psych consult

## 2024-04-11 DIAGNOSIS — R451 Restlessness and agitation: Secondary | ICD-10-CM | POA: Diagnosis not present

## 2024-04-11 LAB — LACTIC ACID, PLASMA: Lactic Acid, Venous: 0.5 mmol/L (ref 0.5–1.9)

## 2024-04-11 MED ORDER — QUETIAPINE FUMARATE 150 MG PO TABS: 150.0000 mg | ORAL_TABLET | Freq: Every day | ORAL | 0 refills | Status: AC

## 2024-04-11 MED ORDER — PAROXETINE HCL 40 MG PO TABS: 40.0000 mg | ORAL_TABLET | Freq: Every day | ORAL | 0 refills | Status: DC

## 2024-04-11 NOTE — ED Provider Notes (Addendum)
 Emergency Medicine Observation Re-evaluation Note  Lindsey [name] is a 38 y.o. female, seen on rounds today.  Pt initially presented to the ED for complaints of No chief complaint on file.  Currently, the patient is is no acute distress. Denies any concerns at this time.  Physical Exam  Blood pressure 106/74, pulse (!) 106, temperature (!) 97.5 F (36.4 C), resp. rate 20, height 5' 6 (1.676 m), weight 74.4 kg, last menstrual period 01/09/2011, SpO2 98%.  Physical Exam: General: No apparent distress Pulm: Normal WOB Neuro: Moving all extremities Psych: Resting comfortably     ED Course / MDM     I have reviewed the labs performed to date as well as medications administered while in observation.  Recent changes in the last 24 hours include: No acute events overnight.  Plan   Current plan: Patient awaiting psychiatric disposition.    Harout Scheurich, Josette SAILOR, DO 04/11/24 0520   6:21 AM  Pt's initial lactic was 4.3 which was thought secondary to dehydration.  No signs or symptoms of sepsis other than heart rate of 100.  Patient received IV fluids and repeat lactic is now normal at 0.5.   Bushra Denman, Josette SAILOR, DO 04/11/24 [phone]

## 2024-04-11 NOTE — ED Notes (Addendum)
 Patient up in wheelchair at this time. EDP Jessup made aware that patient was ready for re-assessment. EDP communicated that Psych needed to re-assess her before DC. Claudene, NP notified that patient is awake, calm, and cooperative at this time with mother at bedside.

## 2024-04-11 NOTE — ED Notes (Signed)
Pt resting with eyes closed at this time. Chest rise and fall noted.

## 2024-04-11 NOTE — ED Provider Notes (Signed)
-----------------------------------------   9:48 AM on 04/11/2024 ----------------------------------------- Patient reassessed by psychiatry this morning, doing well following medication adjustments.  New prescriptions sent to patient's pharmacy following these changes and she has been cleared for discharge home by psychiatry.   Willo Dunnings, MD 04/11/24 [phone]

## 2024-04-11 NOTE — Consult Note (Cosign Needed Addendum)
 [facility] Psychiatric Consult Follow-up  Patient Name: .Lindsey [name]  MRN: [id number]  DOB: June 15, 1985  Consult Order details:  Orders (From admission, onward)     Start     Ordered   04/10/24 1300  CONSULT TO CALL ACT TEAM       Ordering Provider: Dorothyann Drivers, MD  Provider:  (Not yet assigned)  Question:  Reason for Consult?  Answer:  Psych consult   04/10/24 1259   04/10/24 1300  IP CONSULT TO PSYCHIATRY       Ordering Provider: Dorothyann Drivers, MD  Provider:  (Not yet assigned)  Question Answer Comment  Consult Timeframe ROUTINE - requires response within 24 hours   Reason for Consult? 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 female admitted: Presented to the EDfor 04/10/2024 12:30 PM for Agitation, insomnia, aggression, medication review, disposition evaluation . 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   Patient was seen initially by Dr.Madaram who recommended the following medication adjustments. 1. Observe Overnight in ED or Observation Unit  Monitor level of consciousness, airway, hydration  No additional sedating medications tonight  2. Discontinue Cogentin  No indication  Unnecessary anticholinergic burden  3. Adjust  Chronic Medication Regimen   Reduce Paxil  to 40 mg po at bedtime   Lamictal : Consolidate to 200 mg nightly (helps with sleep-wake structure, reduces daytime sedation)  Seroquel : Reduce from 300 mg to 150 mg nightly to avoid excessive sedation  Avoid further benzodiazepines tonight due to current sedation  4. Hydration  Encourage PO; administer IV fluids if needed  5. Behavioral/Social Considerations  Mother is overwhelmed; caregiver strain is extremely high  Recommend:  Social work involvement  IDD/DD support services referral  Respite or in-home support evaluation  6. Disposition  Likely safe for discharge tomorrow morning if patient returns to baseline alertness and behavioral stability  Provide mother with clear safety instructions  Follow up with outpatient neurology/psychiatry within 1 week.   This provider reassessed patient this morning.  Mother was at bedside with patient.  Mother reported that patient had rested overnight and seemed to be improving.  We discussed the above medication adjustments.  Mother verbalized understanding.  Mother did report that she wishes for the Lamictal  dosage to stay the same as this is prescribed by patient's neurologist and for her seizure disorder so she is not comfortable adjusting this at this time, which is fair.  Mother reported she will continue following regularly with neurologist for management.  Mother was okay with discontinuing the Cogentin (benztropine), decreasing Paxil  to 40 mg at bedtime and decreasing Seroquel  150 mg nightly.  The other medications, mother wishes to remain the same and will continue to follow-up with outpatient psychiatric providers at [facility].  Patient mother reported she has follow-up scheduled for this  month with her PCP.  Mother expressed desire to take patient home and felt comfortable with discharge at this time.  The updated medications were called into patient's preferred pharmacy.  Mother  verbalized understanding at this time.  We discussed the importance of not suddenly stopping the benzodiazepine therapy and the need for gradual taper as this can contribute to withdrawal symptoms and may worsen patient agitation.  Mother verbalized understanding and reported she plans to follow-up with outpatient provider for safe monitoring of medications.    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     ## Safety and Observation Level:  - Based on my clinical evaluation, I estimate the patient to be at low risk of self [name] in the current setting. - At this time, we recommend  routine. This decision is based on my review of the chart including patient's history and current presentation, interview of the patient, mental status examination, and consideration of suicide risk including evaluating suicidal ideation, plan, intent, suicidal or self-[name] behaviors, risk factors, and protective factors. This judgment is based on our ability to directly address suicide risk, implement suicide prevention strategies, and develop a safety plan while the patient is in the clinical setting. Please contact our team if there is a concern that risk level has changed.  CSSR Risk Category:C-SSRS RISK CATEGORY: No Risk  Suicide Risk Assessment: Patient has following modifiable risk factors for suicide: N/A, which we are addressing by N/A. Patient has following non-modifiable or demographic risk factors for  suicide: N/A Patient has the following protective factors against suicide: Access to outpatient mental health care, Supportive family, no history of suicide attempts, and no history of NSSIB  Thank you for this consult request. Recommendations have been communicated to the primary team.  We will sign off at this time.   Zelda Sharps, NP        History of Present Illness  Relevant Aspects of Hospital ED   Collected on initial assessment by Dr. Ruther- reference note    Psychiatric and Social History  Collected on initial assessment by Dr. Ruther- reference note  Exam Findings  Physical Exam: Reviewed and agree with the physical exam findings conducted by the medical provider Vital Signs:  Temp:  [97.5 F (36.4 C)-98.6 F (37 C)] 98.6 F (37 C) (12/06 1000) Pulse Rate:  [53-106] 90 (12/06 1000) Resp:  [17-20] 17 (12/06 1000) BP: (102-146)/(60-94) 102/60 (12/06 1000) SpO2:  [97 %-100 %] 100 % (12/06 1000) Weight:  [74.4 kg] 74.4 kg (12/05 1223) Blood pressure 102/60, pulse 90, temperature 98.6 F (37 C), temperature source Axillary, resp. rate 17, height 5' 6 (1.676 m), weight 74.4 kg, last menstrual period 01/09/2011, SpO2 100%. Body mass index is 26.47 kg/m.       Other History   These have been pulled in through the EMR, reviewed, and updated if appropriate.  Family History:  The patient's family history includes Cancer in her maternal aunt, maternal aunt, and maternal grandfather.  Medical History: Past Medical History:  Diagnosis Date   Allergy 1991  Anemia    Anxiety 2000   COVID-19 12/2020   COVID-19 12/21/2020   Diagnosed in ER .  rx NOT GIVEN IN ER  For unclear reasons.   molnupiravir  [facility]     Epilepsy [facility]) age 58   negative metabolic workup has brain abnormalities by MRI (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   Right   Torticollis, ocular     Surgical History: Past Surgical History:  Procedure Laterality Date   ABDOMINAL HYSTERECTOMY  2005   BRAIN SURGERY  1994   Temporal Lobe resection   CERUMEN REMOVAL Bilateral 03/01/2023   Procedure: CERUMEN REMOVAL;  Surgeon: Herminio Miu, MD;  Location: [facility];  Service: ENT;  Laterality: Bilateral;   CRANIOTOMY FOR TEMPORAL LOBECTOMY     EYE SURGERY  1993   TONSILLECTOMY AND ADENOIDECTOMY  age 43     Medications:   Current Facility-Administered Medications:    benzonatate  (TESSALON ) capsule 100 mg, 100 mg, Oral, TID PRN, Bradler, Evan K, MD   lamoTRIgine  (LAMICTAL ) tablet 200 mg, 200 mg, Oral, QHS, Bradler, Evan K, MD, 200 mg at 04/10/24 2310   levETIRAcetam  (KEPPRA ) tablet 2,250 mg, 2,250 mg, Oral, Daily, Bradler, Evan K, MD, 2,250 mg at 04/11/24 1003   melatonin tablet 5 mg, 5 mg, Oral, QHS, Bradler, Evan K, MD, 5 mg at 04/10/24 2311   ondansetron  (ZOFRAN -ODT) disintegrating tablet 4 mg, 4 mg, Oral, Q8H PRN, Bradler, Evan K, MD   PARoxetine  (PAXIL ) tablet 40 mg, 40 mg, Oral, QHS, Bradler, Evan K, MD, 40 mg at 04/10/24 2311   QUEtiapine  (SEROQUEL ) tablet 150 mg, 150 mg, Oral, QHS, Bradler, Evan K, MD, 150 mg at 04/10/24 2309  Current Outpatient Medications:    ALPRAZolam  (XANAX ) 0.5 MG tablet, TAKE ONE TABLET TWICE DAILY AS NEEDED FOR ANXIETY, Disp: 60 tablet, Rfl: 2   benzonatate  (TESSALON ) 100 MG capsule, Take 1 capsule (100 mg total) by mouth 3 (three) times daily as needed for cough., Disp: 21 capsule, Rfl: 0   chlorhexidine  (PERIDEX ) 0.12 % solution, AFTER BREAKFAST AND SUPPER,  SWISH WITH (TO FILL LINE) FOR 30 SECONDS AND SPIT OUT., Disp: 473 mL, Rfl: 0   diazepam  (DIASTAT  ACUDIAL) 10 MG GEL, Place 20 mg rectally once., Disp: , Rfl:    diazepam  (VALIUM ) 10 MG tablet, TAKE ONE TABLET BY MOUTH EVERY EVENING, Disp: 30 tablet, Rfl: 2   diazepam  (VALIUM ) 2 MG tablet, Take 1 tablet (2 mg total) by mouth in the morning., Disp: 30 tablet, Rfl: 5   EPINEPHrine  0.3 mg/0.3 mL IJ SOAJ injection, INJECT 0.3 MG INTRAMUSCULARLY AS NEEDED FOR ANAPHYLAXIS, Disp: 2 each, Rfl: 0   folic acid  (FOLVITE ) 1 MG tablet, Take 1 tablet (1 mg total) by mouth daily., Disp: 90 tablet, Rfl: 3   LAMICTAL  100 MG tablet, Take 100 mg by mouth 2 (two) times daily. , Disp: , Rfl:    levETIRAcetam  (KEPPRA ) 750 MG tablet, Take 3 tablets (2,250 mg total) by mouth daily. TAKE 1 and  a half pills twice a day, Disp: 90 tablet, Rfl: 0   Magnesium  Glycinate 100 MG CAPS, Take 1 capsule by mouth at bedtime., Disp: , Rfl:    melatonin 5 MG TABS, Take 5 mg by mouth at bedtime., Disp: , Rfl:    ondansetron  (ZOFRAN -ODT) 4 MG disintegrating tablet, Take 1 tablet (4 mg total) by mouth every 8 (eight) hours as needed for nausea or vomiting., Disp: 20 tablet, Rfl: 0   PARoxetine  (PAXIL ) 40 MG tablet, Take 1 tablet (40 mg total) by mouth at bedtime., Disp: 30 tablet, Rfl: 0   QUEtiapine  150 MG TABS, Take 150 mg by mouth at bedtime., Disp: 30 tablet, Rfl: 0  Allergies: Allergies  Allergen Reactions   Other Swelling     Allergic Reaction to all nuts/ingredients   Influenza Vac Split Quad Other (See Comments)    48 hours of fever, myalgias    Benadryl  [Diphenhydramine  Hcl]     Seizure   Montelukast  Swelling   Ropinirole  Swelling and Dermatitis    Itching,   Zyrtec [Cetirizine Hcl]     Seizure    Peanut-Containing Drug Products Rash    Zelda Sharps, NP This note was created using Scientist, clinical (histocompatibility and immunogenetics). Please excuse any inadvertent transcription errors. Case was discussed with supervising physician Dr.  Jadapalle who is agreeable with current plan.

## 2024-04-13 ENCOUNTER — Telehealth: Payer: Self-pay

## 2024-04-13 NOTE — Telephone Encounter (Signed)
 Spoke with pt's mother and she stated that they have a follow up with psychiatry scheduled in Feb. The psychiatrist is aware of the medication changes.

## 2024-04-13 NOTE — Telephone Encounter (Signed)
 Copied from CRM #[id number]. Topic: General - Other >> Apr 13, 2024 10:38 AM Suzen RAMAN wrote: Reason for CRM: Patient was recently hospitalized on 04/10/24 and some changes have been made to her medication and patient mother would like to make you aware.Patient ER visit notes are in the system and patient mother would like to ensure they're review and stated medication change should be notate there as well.

## 2024-04-24 ENCOUNTER — Encounter: Admitting: Internal Medicine

## 2024-04-25 ENCOUNTER — Other Ambulatory Visit: Payer: Self-pay | Admitting: Internal Medicine

## 2024-04-25 DIAGNOSIS — G4701 Insomnia due to medical condition: Secondary | ICD-10-CM

## 2024-04-27 ENCOUNTER — Encounter: Payer: 59 | Admitting: Internal Medicine

## 2024-04-27 NOTE — Telephone Encounter (Signed)
 Refilled: 01/20/2024 Last OV: 01/01/2024 Next OV: 05/04/2024  Spoke with pt's mother and she stated that she does not need the 2 mg refilled she only needs the 10 mg. She stated that she only has one for tonight and then will be out.

## 2024-04-27 NOTE — Telephone Encounter (Signed)
 Copied from CRM #[id number]. Topic: Clinical - Prescription Issue >> Apr 27, 2024  9:30 AM Eva FALCON wrote: Reason for CRM: Pt mom is calling about daughters Diazepam  10mg  tablet, states a request was sent over by pharmacy Saturday and today. She has one pill left and is concerned she won't get it in time for daughter since its the holiday this week. I did advise of 3 business day turnover which she understood,but is still wanting a message to be sent if possible. Good call back number [phone].

## 2024-04-27 NOTE — Telephone Encounter (Signed)
 1. Insomnia due to medical condition (Primary) - Patient with h/o CP, anxiety, insomnia and is on multiple medications including: Diazepam  10 mg, diazepam  2 mg, alprazolam  0.5 mg and follows up closely with PCP Dr. Marylynn. Refill for Diazepam  10 mg, 30 tablets sent after reviewing PDMP.  - diazepam  (VALIUM ) 10 MG tablet; TAKE ONE TABLET BY MOUTH EVERY EVENING  Dispense: 30 tablet; Refill: 0   Luke Shade, MD

## 2024-04-27 NOTE — Telephone Encounter (Signed)
 Pt's mother is aware that medication has been refilled.

## 2024-05-04 ENCOUNTER — Other Ambulatory Visit: Payer: Self-pay | Admitting: Internal Medicine

## 2024-05-04 ENCOUNTER — Encounter: Admitting: Internal Medicine

## 2024-05-04 ENCOUNTER — Encounter: Payer: Self-pay | Admitting: Internal Medicine

## 2024-05-04 DIAGNOSIS — G4701 Insomnia due to medical condition: Secondary | ICD-10-CM

## 2024-05-05 NOTE — Telephone Encounter (Signed)
 Diazepam  refilled: 04/27/2024 Alprazolam  refilled: 02/01/2024 Last OV: 01/01/2024 Next OV: 05/08/2024

## 2024-05-06 ENCOUNTER — Telehealth: Payer: Self-pay

## 2024-05-06 NOTE — Telephone Encounter (Signed)
"  Faxed  "

## 2024-05-06 NOTE — Telephone Encounter (Signed)
 Form for wheelchair has been placed in quick sign folder.

## 2024-05-06 NOTE — Telephone Encounter (Signed)
 Pt's mother sometimes has to put pt in a wheelchair based on how agitated she is.

## 2024-05-08 ENCOUNTER — Ambulatory Visit: Admitting: Internal Medicine

## 2024-05-08 ENCOUNTER — Encounter: Payer: Self-pay | Admitting: Internal Medicine

## 2024-05-08 VITALS — BP 102/70 | HR 94 | Ht 66.0 in | Wt 145.8 lb

## 2024-05-08 DIAGNOSIS — Z0000 Encounter for general adult medical examination without abnormal findings: Secondary | ICD-10-CM | POA: Diagnosis not present

## 2024-05-08 DIAGNOSIS — G40909 Epilepsy, unspecified, not intractable, without status epilepticus: Secondary | ICD-10-CM | POA: Diagnosis not present

## 2024-05-08 DIAGNOSIS — R4689 Other symptoms and signs involving appearance and behavior: Secondary | ICD-10-CM | POA: Diagnosis not present

## 2024-05-08 DIAGNOSIS — D61811 Other drug-induced pancytopenia: Secondary | ICD-10-CM | POA: Diagnosis not present

## 2024-05-08 DIAGNOSIS — T4275XA Adverse effect of unspecified antiepileptic and sedative-hypnotic drugs, initial encounter: Secondary | ICD-10-CM | POA: Diagnosis not present

## 2024-05-08 DIAGNOSIS — R451 Restlessness and agitation: Secondary | ICD-10-CM | POA: Diagnosis not present

## 2024-05-08 NOTE — Assessment & Plan Note (Signed)

## 2024-05-08 NOTE — Patient Instructions (Addendum)
 Lindsey [name] appears to be doing better without morning doses of alprazolam  and valium  and with increased time playing at [employer]   No changes were made today

## 2024-05-08 NOTE — Assessment & Plan Note (Signed)
 Now seeing psychiatrist at [facility].  Seroquel  dose reduced by ER Psych Consult in Dec to 150 mg  taken at 7 pm.. paxil  dose reduced as well due to excessive daytime sedation. Continue valium  10 mg at 7 pm; no daytime benzo's per psychiatry consult

## 2024-05-08 NOTE — Assessment & Plan Note (Addendum)
 REVIEWED OVERNIGHT ER STAY DEC 2025 FOR AGITATION.  SEEN BY PSYCHIATRY /ACT TEAM.  MEDICATION CHANGES RECOMMENDED :  Mother was okay with discontinuing the Cogentin (benztropine), decreasing Paxil  to 40 mg at bedtime and decreasing Seroquel  150 mg nightly .but did not change lamictal  dosing to once daily due to it being prescribed for seizure disorder. :  mother was advised to reduce use of benzodiazepines,  Genevia appears to be doing better without morning doses of alprazolam  and valium  and with increased time playing at [employer]   No changes were made today

## 2024-05-08 NOTE — Assessment & Plan Note (Signed)
 CBC is normal  Lab Results  Component Value Date   WBC 9.2 04/10/2024   HGB 15.2 (H) 04/10/2024   HCT 43.4 04/10/2024   MCV 83.1 04/10/2024   PLT 427 (H) 04/10/2024

## 2024-05-08 NOTE — Progress Notes (Signed)
 Patient ID: Lindsey [name], female    DOB: 1986/03/07  Age: 39 y.o. MRN: [id number]  The patient is here for FOLLOW UP AND  management of other chronic and acute problems.   The risk factors are reflected in the social history.  The roster of all physicians providing medical care to patient - is listed in the Snapshot section of the chart.  Activities of daily living:  The patient has cerebral palsy and seizure disorder and is dependent in all ADLs: dressing, toileting, feeding as well as independent mobility and uses a wheelchair at time    Home safety : The patient has smoke detectors in the home. They wear seatbelts.  There are no firearms at home. There is no violence in the home.   There is no risks for hepatitis, STDs or HIV. There is no   history of blood transfusion. They have no travel history to infectious disease endemic areas of the world.  The patient has seen their dentist in the last six month. They have seen their eye doctor in the last year. They admit to slight hearing difficulty with regard to whispered voices and some television programs.  They have deferred audiologic testing in the last year.  They do not  have excessive sun exposure. Discussed the need for sun protection: hats, long sleeves and use of sunscreen if there is significant sun exposure.   Diet: the importance of a healthy diet is discussed. Her diet is dependent on her mood .  The benefits of regular aerobic exercise were discussed. She DOES NOT EXERCISE .   Depression screen: there are no signs or vegative symptoms of depression- irritability, change in appetite, anhedonia, sadness/tearfullness.  Cognitive assessment: the patient manages all their financial and personal affairs and is actively engaged. They could relate day,date,year and events; recalled 2/3 objects at 3 minutes; performed clock-face test normally.  The following portions of the patient's history were reviewed and updated as appropriate:  allergies, current medications, past family history, past medical history,  past surgical history, past social history  and problem list.  Visual acuity was not assessed per patient preference since she has regular follow up with her ophthalmologist. Hearing and body mass index were assessed and reviewed.   During the course of the visit the patient was educated and counseled about appropriate screening and preventive services including : fall prevention , diabetes screening, nutrition counseling, colorectal cancer screening, and recommended immunizations.    CC: The primary encounter diagnosis was Change in behavior. Diagnoses of Anticonvulsant-induced pancytopenia, Seizure disorder (HCC), Restlessness and agitation, and Routine general medical examination at a health care facility were also pertinent to this visit.   Seizure disorder:  continue keppra  and lamictal .  No seizures recently   Anxiety  Behavioural problems agitation;  mother has reduced the daytime benzo's    no scheduled valium  or alprazolam ,  only prn doses .  Cogentin discontinued ,  paxil  and seroquel  doses lowered  .  Still doing lamictal  bid and keppra  bid . Has only had to use alprazolam  0.5 mg at night for trouble sleeping (mother notes that the agitation  coincides with full moon and lack of daycare programs)  Has been Going to day care program for longer hours as of Monday 8;45 to 2:00 pm ,  (previously 8:45 to noon)  seems to be doing better with the longer hours at daycare.  No daytrips due to mobility   History Ouida has a past medical history of Allergy (  1991), Anemia, Anxiety (2000), Aspiration pneumonia of left lower lobe (HCC) (01/01/2024), COVID-19 (12/2020), COVID-19 (12/21/2020), Epilepsy (HCC) (age 71), Hypertropia of left eye, Ingrown toenail without infection (12/15/2012), Insomnia, Intellectual disability, Paronychia of right middle finger (09/24/2022), Partial epilepsy with impairment of consciousness,  intractable (HCC), Pneumonia of left lower lobe due to infectious organism (05/07/2022), Psychomotor agitation, Seizures (HCC) (1991), Status post VNS (vagus nerve stimulator) placement (05/07/1998), Temporal lobectomy behavior syndrome (05/07/1992), and Torticollis, ocular.   She has a past surgical history that includes Tonsillectomy and adenoidectomy (age 38); Abdominal hysterectomy (2005); Brain surgery (1994); Craniotomy for temporal lobectomy; Cerumen removal (Bilateral, 03/01/2023); and Eye surgery (1993).   Her family history includes Cancer in her maternal aunt, maternal aunt, and maternal grandfather.She reports that she has never smoked. She has never used smokeless tobacco. She reports that she does not drink alcohol and does not use drugs.  Outpatient Medications Prior to Visit  Medication Sig Dispense Refill   ALPRAZolam  (XANAX ) 0.5 MG tablet TAKE ONE TABLET TWICE DAILY AS NEEDED FOR ANXIETY 60 tablet 2   chlorhexidine  (PERIDEX ) 0.12 % solution AFTER BREAKFAST AND SUPPER, SWISH WITH (TO FILL LINE) FOR 30 SECONDS AND SPIT OUT. 473 mL 0   diazepam  (DIASTAT  ACUDIAL) 10 MG GEL Place 20 mg rectally once.     diazepam  (VALIUM ) 10 MG tablet TAKE ONE TABLET BY MOUTH EVERY EVENING 30 tablet 0   EPINEPHrine  0.3 mg/0.3 mL IJ SOAJ injection INJECT 0.3 MG INTRAMUSCULARLY AS NEEDED FOR ANAPHYLAXIS 2 each 0   folic acid  (FOLVITE ) 1 MG tablet Take 1 tablet (1 mg total) by mouth daily. 90 tablet 3   LAMICTAL  100 MG tablet Take 100 mg by mouth 2 (two) times daily.      levETIRAcetam  (KEPPRA ) 750 MG tablet Take 3 tablets (2,250 mg total) by mouth daily. TAKE 1 and a half pills twice a day 90 tablet 0   Magnesium  Glycinate 100 MG CAPS Take 1 capsule by mouth at bedtime.     melatonin 5 MG TABS Take 5 mg by mouth at bedtime.     ondansetron  (ZOFRAN -ODT) 4 MG disintegrating tablet Take 1 tablet (4 mg total) by mouth every 8 (eight) hours as needed for nausea or vomiting. 20 tablet 0   PARoxetine   (PAXIL ) 30 MG tablet Take 30 mg by mouth 2 (two) times daily.     QUEtiapine  150 MG TABS Take 150 mg by mouth at bedtime. 30 tablet 0   VALTOCO  20 MG DOSE 2 x 10 MG/0.1ML LQPK Place into both nostrils.     ALPRAZolam  (XANAX ) 0.25 MG tablet TAKE ONE-HALF TABLET BY MOUTH EVERY MORNING AND TAKE ONE TABLET IN THE EVENING 45 tablet 0   benzonatate  (TESSALON ) 100 MG capsule Take 1 capsule (100 mg total) by mouth 3 (three) times daily as needed for cough. 21 capsule 0   diazepam  (VALIUM ) 2 MG tablet TAKE ONE TABLET BY MOUTH EVERY MORNING 30 tablet 2   PARoxetine  (PAXIL ) 40 MG tablet Take 1 tablet (40 mg total) by mouth at bedtime. (Patient not taking: Reported on 05/08/2024) 30 tablet 0   No facility-administered medications prior to visit.    Review of Systems  Objective:  BP 102/70   Pulse 94   Ht 5' 6 (1.676 m)   Wt 145 lb 12.8 oz (66.1 kg)   LMP 01/09/2011   SpO2 96%   BMI 23.53 kg/m   Physical Exam  Assessment & Plan:  Change in behavior Assessment & Plan: REVIEWED OVERNIGHT  ER STAY DEC 2025 FOR AGITATION.  SEEN BY PSYCHIATRY /ACT TEAM.  MEDICATION CHANGES RECOMMENDED :  Mother was okay with discontinuing the Cogentin (benztropine), decreasing Paxil  to 40 mg at bedtime and decreasing Seroquel  150 mg nightly .but did not change lamictal  dosing to once daily due to it being prescribed for seizure disorder. :  mother was advised to reduce use of benzodiazepines,  Brion appears to be doing better without morning doses of alprazolam  and valium  and with increased time playing at Daycare   No changes were made today    Anticonvulsant-induced pancytopenia Assessment & Plan: CBC is normal  Lab Results  Component Value Date   WBC 9.2 04/10/2024   HGB 15.2 (H) 04/10/2024   HCT 43.4 04/10/2024   MCV 83.1 04/10/2024   PLT 427 (H) 04/10/2024      Seizure disorder (HCC) Assessment & Plan: S/p temporal lobectomy, remote for recurrent seizures.   She has not had any recent seizures   Continue  Keppra  and Lamictal  bid.  Follow up with neurology    Restlessness and agitation Assessment & Plan: Now seeing psychiatrist at [facility].  Seroquel  dose reduced by ER Psych Consult in Dec to 150 mg  taken at 7 pm.. paxil  dose reduced as well due to excessive daytime sedation. Continue valium  10 mg at 7 pm; no daytime benzo's per psychiatry consult    Routine general medical examination at a health care facility Assessment & Plan: age appropriate education and counseling updated, referrals for preventative services and immunizations addressed, dietary and smoking counseling addressed, most recent labs reviewed.  I have personally reviewed and have noted:   1) the patient's medical and social history 2) The pt's use of alcohol, tobacco, and illicit drugs 3) The patient's current medications and supplements 4) Functional ability including ADL's, fall risk, home safety risk, hearing and visual impairment 5) Diet and physical activities 6) Evidence for depression or mood disorder 7) The patient's height, weight, and BMI have been recorded in the chart   I have made referrals, and provided counseling and education based on review of the above        I provided 40 minutes of  face-to-face time during this encounter reviewing patient's current problems and past surgeries,  recent labs and imaging studies, providing counseling on the above mentioned problems , and coordination  of care .   Follow-up: Return in about 3 months (around 08/06/2024).   Verneita LITTIE Kettering, MD

## 2024-05-08 NOTE — Assessment & Plan Note (Signed)
 S/p temporal lobectomy, remote for recurrent seizures.   She has not had any recent seizures  Continue  Keppra  and Lamictal  bid.  Follow up with neurology

## 2024-05-18 ENCOUNTER — Other Ambulatory Visit: Payer: Self-pay | Admitting: Internal Medicine

## 2024-05-20 ENCOUNTER — Other Ambulatory Visit: Payer: Self-pay | Admitting: Internal Medicine

## 2024-05-20 MED ORDER — ALPRAZOLAM 0.5 MG PO TABS: 0.5000 mg | ORAL_TABLET | Freq: Every evening | ORAL | 5 refills | Status: AC | PRN

## 2024-05-20 NOTE — Telephone Encounter (Signed)
 Copied from CRM [id number]. Topic: Clinical - Medication Refill >> May 20, 2024  2:23 PM Zebedee SAUNDERS wrote: Medication: ALPRAZolam  (XANAX ) 0.5 MG tablet  Has the patient contacted their pharmacy? Yes (Agent: If no, request that the patient contact the pharmacy for the refill. If patient does not wish to contact the pharmacy document the reason why and proceed with request.) (Agent: If yes, when and what did the pharmacy advise?)  This is the patient's preferred pharmacy:  [facility] - [place], [place] - [address] [place] [address] Phone: [phone] Fax: [phone]  Is this the correct pharmacy for this prescription? Yes If no, delete pharmacy and type the correct one.   Has the prescription been filled recently? Yes  Is the patient out of the medication? Yes  Has the patient been seen for an appointment in the last year OR does the patient have an upcoming appointment? Yes  Can we respond through MyChart? Yes  Agent: Please be advised that Rx refills may take up to 3 business days. We ask that you follow-up with your pharmacy.

## 2024-05-22 ENCOUNTER — Other Ambulatory Visit: Payer: Self-pay | Admitting: Internal Medicine

## 2024-05-22 DIAGNOSIS — G4701 Insomnia due to medical condition: Secondary | ICD-10-CM

## 2024-05-22 DIAGNOSIS — F32A Depression, unspecified: Secondary | ICD-10-CM

## 2024-05-22 NOTE — Telephone Encounter (Signed)
 05/08/2024 LOV  05/20/2024 fill date   45/5 refills

## 2024-05-22 NOTE — Telephone Encounter (Signed)
 05/08/2024 LOV  04/27/2024 fill date  45/5 refills

## 2024-05-23 NOTE — Assessment & Plan Note (Signed)
 Now seeing psychiatrist at [facility].  Seroquel  dose reduced by ER Psych Consult in Dec 2025  to 150 mg  taken at 7 pm.. paxil  dose reduced as well to 40 mg once daily due to excessive daytime sedation. Continue valium  10 mg at 7 pm; no daytime benzo's per psychiatry consult

## 2024-05-25 ENCOUNTER — Telehealth: Payer: Self-pay | Admitting: Internal Medicine

## 2024-05-25 NOTE — Telephone Encounter (Signed)
 Non- opioid substance agreement has been scanned into the chart under release of information.

## 2024-05-25 NOTE — Telephone Encounter (Signed)
 noted

## 2024-05-25 NOTE — Telephone Encounter (Signed)
 Lindsey [name], mother in office to sign non-opioid agreement.  Signed and placed in red folder to be scanned into chart.

## 2025-05-10 ENCOUNTER — Encounter: Admitting: Internal Medicine
